# Patient Record
Sex: Male | Born: 1945
Health system: Southern US, Community
[De-identification: ages and names within clinical notes are randomized; demographics above are authoritative.]

## PROBLEM LIST (undated history)

## (undated) DIAGNOSIS — W3400XA Accidental discharge from unspecified firearms or gun, initial encounter: Secondary | ICD-10-CM

## (undated) DIAGNOSIS — N183 Chronic kidney disease, stage 3 unspecified: Secondary | ICD-10-CM

## (undated) DIAGNOSIS — I509 Heart failure, unspecified: Secondary | ICD-10-CM

## (undated) DIAGNOSIS — I10 Essential (primary) hypertension: Secondary | ICD-10-CM

## (undated) DIAGNOSIS — K219 Gastro-esophageal reflux disease without esophagitis: Secondary | ICD-10-CM

## (undated) DIAGNOSIS — E785 Hyperlipidemia, unspecified: Secondary | ICD-10-CM

## (undated) DIAGNOSIS — Z8719 Personal history of other diseases of the digestive system: Secondary | ICD-10-CM

## (undated) DIAGNOSIS — E119 Type 2 diabetes mellitus without complications: Secondary | ICD-10-CM

## (undated) DIAGNOSIS — Y249XXA Unspecified firearm discharge, undetermined intent, initial encounter: Secondary | ICD-10-CM

## (undated) DIAGNOSIS — M1990 Unspecified osteoarthritis, unspecified site: Secondary | ICD-10-CM

## (undated) DIAGNOSIS — I480 Paroxysmal atrial fibrillation: Secondary | ICD-10-CM

## (undated) HISTORY — DX: Paroxysmal atrial fibrillation: I48.0

## (undated) HISTORY — DX: Unspecified firearm discharge, undetermined intent, initial encounter: Y24.9XXA

## (undated) HISTORY — DX: Type 2 diabetes mellitus without complications: E11.9

## (undated) HISTORY — DX: Accidental discharge from unspecified firearms or gun, initial encounter: W34.00XA

## (undated) HISTORY — DX: Essential (primary) hypertension: I10

## (undated) HISTORY — DX: Unspecified osteoarthritis, unspecified site: M19.90

## (undated) HISTORY — DX: Hyperlipidemia, unspecified: E78.5

## (undated) HISTORY — PX: OTHER SURGICAL HISTORY: SHX169

## (undated) HISTORY — DX: Gastro-esophageal reflux disease without esophagitis: K21.9

## (undated) HISTORY — PX: HERNIA REPAIR: SHX51

---

## 2002-07-07 ENCOUNTER — Emergency Department (HOSPITAL_COMMUNITY): Admission: EM | Admit: 2002-07-07 | Discharge: 2002-07-07 | Payer: Self-pay | Admitting: *Deleted

## 2003-06-12 ENCOUNTER — Ambulatory Visit (HOSPITAL_COMMUNITY): Admission: RE | Admit: 2003-06-12 | Discharge: 2003-06-12 | Payer: Self-pay | Admitting: Family Medicine

## 2006-04-02 ENCOUNTER — Ambulatory Visit: Payer: Self-pay | Admitting: Family Medicine

## 2006-04-02 DIAGNOSIS — I10 Essential (primary) hypertension: Secondary | ICD-10-CM | POA: Insufficient documentation

## 2006-04-03 ENCOUNTER — Encounter (INDEPENDENT_AMBULATORY_CARE_PROVIDER_SITE_OTHER): Payer: Self-pay | Admitting: Family Medicine

## 2006-04-06 ENCOUNTER — Encounter (INDEPENDENT_AMBULATORY_CARE_PROVIDER_SITE_OTHER): Payer: Self-pay | Admitting: Family Medicine

## 2006-04-22 ENCOUNTER — Encounter (INDEPENDENT_AMBULATORY_CARE_PROVIDER_SITE_OTHER): Payer: Self-pay | Admitting: Family Medicine

## 2006-04-28 LAB — CONVERTED CEMR LAB
ALT: 13 units/L (ref 0–53)
Albumin: 3.9 g/dL (ref 3.5–5.2)
BUN: 10 mg/dL (ref 6–23)
Basophils Absolute: 0 10*3/uL (ref 0.0–0.1)
Basophils Relative: 0 % (ref 0–1)
CO2: 26 meq/L (ref 19–32)
Calcium: 8.9 mg/dL (ref 8.4–10.5)
Chloride: 101 meq/L (ref 96–112)
Eosinophils Absolute: 0.1 10*3/uL (ref 0.0–0.7)
HDL: 44 mg/dL (ref >39)
Hemoglobin: 14.7 g/dL (ref 13.0–17.0)
Lymphs Abs: 1.9 10*3/uL (ref 0.7–3.3)
MCHC: 33.3 g/dL (ref 30.0–36.0)
MCV: 82.6 fL (ref 78.0–100.0)
Monocytes Absolute: 0.6 10*3/uL (ref 0.2–0.7)
Platelets: 224 10*3/uL (ref 150–400)
Potassium: 3.3 meq/L — ABNORMAL LOW (ref 3.5–5.3)
RDW: 14.3 % — ABNORMAL HIGH (ref 11.5–14.0)
TSH: 1.06 microintl units/mL (ref 0.350–5.50)
Total CHOL/HDL Ratio: 3.4
WBC: 7.9 10*3/uL (ref 4.0–10.5)

## 2006-05-05 ENCOUNTER — Ambulatory Visit: Payer: Self-pay | Admitting: Family Medicine

## 2006-05-05 DIAGNOSIS — E782 Mixed hyperlipidemia: Secondary | ICD-10-CM | POA: Insufficient documentation

## 2006-05-05 DIAGNOSIS — R7989 Other specified abnormal findings of blood chemistry: Secondary | ICD-10-CM | POA: Insufficient documentation

## 2006-05-05 DIAGNOSIS — E785 Hyperlipidemia, unspecified: Secondary | ICD-10-CM | POA: Insufficient documentation

## 2006-05-05 LAB — CONVERTED CEMR LAB
Cholesterol, target level: 200 mg/dL
Hgb A1c MFr Bld: 6.1 %

## 2006-05-11 ENCOUNTER — Telehealth (INDEPENDENT_AMBULATORY_CARE_PROVIDER_SITE_OTHER): Payer: Self-pay | Admitting: Family Medicine

## 2006-05-11 ENCOUNTER — Encounter (INDEPENDENT_AMBULATORY_CARE_PROVIDER_SITE_OTHER): Payer: Self-pay | Admitting: Family Medicine

## 2006-05-18 ENCOUNTER — Ambulatory Visit (HOSPITAL_COMMUNITY): Admission: RE | Admit: 2006-05-18 | Discharge: 2006-05-18 | Payer: Self-pay | Admitting: Family Medicine

## 2006-05-21 ENCOUNTER — Telehealth (INDEPENDENT_AMBULATORY_CARE_PROVIDER_SITE_OTHER): Payer: Self-pay | Admitting: *Deleted

## 2006-05-29 ENCOUNTER — Encounter (INDEPENDENT_AMBULATORY_CARE_PROVIDER_SITE_OTHER): Payer: Self-pay | Admitting: Family Medicine

## 2006-06-02 ENCOUNTER — Ambulatory Visit: Payer: Self-pay | Admitting: Family Medicine

## 2006-06-03 ENCOUNTER — Encounter (INDEPENDENT_AMBULATORY_CARE_PROVIDER_SITE_OTHER): Payer: Self-pay | Admitting: Family Medicine

## 2006-06-04 LAB — CONVERTED CEMR LAB
CO2: 26 meq/L (ref 19–32)
Chloride: 103 meq/L (ref 96–112)
Glucose, Bld: 96 mg/dL (ref 70–99)
Potassium: 3.8 meq/L (ref 3.5–5.3)

## 2007-01-18 ENCOUNTER — Telehealth (INDEPENDENT_AMBULATORY_CARE_PROVIDER_SITE_OTHER): Payer: Self-pay | Admitting: *Deleted

## 2007-01-18 ENCOUNTER — Ambulatory Visit (HOSPITAL_COMMUNITY): Admission: RE | Admit: 2007-01-18 | Discharge: 2007-01-18 | Payer: Self-pay | Admitting: Family Medicine

## 2007-01-18 ENCOUNTER — Ambulatory Visit: Payer: Self-pay | Admitting: Family Medicine

## 2007-01-19 ENCOUNTER — Telehealth (INDEPENDENT_AMBULATORY_CARE_PROVIDER_SITE_OTHER): Payer: Self-pay | Admitting: *Deleted

## 2007-01-19 ENCOUNTER — Encounter (INDEPENDENT_AMBULATORY_CARE_PROVIDER_SITE_OTHER): Payer: Self-pay | Admitting: Family Medicine

## 2007-01-28 ENCOUNTER — Encounter: Payer: Self-pay | Admitting: Family Medicine

## 2007-02-02 ENCOUNTER — Telehealth (INDEPENDENT_AMBULATORY_CARE_PROVIDER_SITE_OTHER): Payer: Self-pay | Admitting: Family Medicine

## 2007-02-04 ENCOUNTER — Encounter (INDEPENDENT_AMBULATORY_CARE_PROVIDER_SITE_OTHER): Payer: Self-pay | Admitting: Family Medicine

## 2007-02-25 ENCOUNTER — Ambulatory Visit: Payer: Self-pay | Admitting: Family Medicine

## 2007-03-19 ENCOUNTER — Ambulatory Visit: Payer: Self-pay | Admitting: Cardiology

## 2007-03-19 ENCOUNTER — Encounter (INDEPENDENT_AMBULATORY_CARE_PROVIDER_SITE_OTHER): Payer: Self-pay | Admitting: Family Medicine

## 2007-03-26 ENCOUNTER — Ambulatory Visit (HOSPITAL_COMMUNITY): Admission: RE | Admit: 2007-03-26 | Discharge: 2007-03-26 | Payer: Self-pay | Admitting: Cardiology

## 2007-03-26 ENCOUNTER — Ambulatory Visit: Payer: Self-pay | Admitting: Cardiology

## 2007-09-21 ENCOUNTER — Emergency Department (HOSPITAL_COMMUNITY): Admission: EM | Admit: 2007-09-21 | Discharge: 2007-09-21 | Payer: Self-pay | Admitting: Cardiology

## 2007-10-18 ENCOUNTER — Ambulatory Visit: Payer: Self-pay | Admitting: Family Medicine

## 2007-11-13 ENCOUNTER — Encounter (INDEPENDENT_AMBULATORY_CARE_PROVIDER_SITE_OTHER): Payer: Self-pay | Admitting: Family Medicine

## 2007-11-15 ENCOUNTER — Ambulatory Visit: Payer: Self-pay | Admitting: Family Medicine

## 2007-11-15 LAB — CONVERTED CEMR LAB
AST: 19 units/L (ref 0–37)
Albumin: 4.1 g/dL (ref 3.5–5.2)
Alkaline Phosphatase: 77 units/L (ref 39–117)
BUN: 19 mg/dL (ref 6–23)
Basophils Absolute: 0 10*3/uL (ref 0.0–0.1)
Calcium: 9.2 mg/dL (ref 8.4–10.5)
Cholesterol: 198 mg/dL (ref 0–200)
Eosinophils Absolute: 0.1 10*3/uL (ref 0.0–0.7)
Glucose, Bld: 105 mg/dL — ABNORMAL HIGH (ref 70–99)
LDL Goal: 160 mg/dL
Lymphs Abs: 1.8 10*3/uL (ref 0.7–4.0)
MCHC: 32.5 g/dL (ref 30.0–36.0)
Neutro Abs: 3.7 10*3/uL (ref 1.7–7.7)
PSA: 1.93 ng/mL (ref 0.10–4.00)
Platelets: 165 10*3/uL (ref 150–400)
RBC: 5.31 M/uL (ref 4.22–5.81)
Sodium: 141 meq/L (ref 135–145)
TSH: 1.738 microintl units/mL (ref 0.350–4.50)
Total Bilirubin: 0.7 mg/dL (ref 0.3–1.2)
Triglycerides: 104 mg/dL (ref <150)
WBC: 6 10*3/uL (ref 4.0–10.5)

## 2007-12-20 ENCOUNTER — Ambulatory Visit (HOSPITAL_COMMUNITY): Admission: RE | Admit: 2007-12-20 | Discharge: 2007-12-20 | Payer: Self-pay | Admitting: Internal Medicine

## 2007-12-20 ENCOUNTER — Ambulatory Visit: Payer: Self-pay | Admitting: Internal Medicine

## 2008-02-14 ENCOUNTER — Ambulatory Visit: Payer: Self-pay | Admitting: Family Medicine

## 2008-02-14 DIAGNOSIS — E669 Obesity, unspecified: Secondary | ICD-10-CM | POA: Insufficient documentation

## 2008-02-14 LAB — CONVERTED CEMR LAB
Blood in Urine, dipstick: NEGATIVE
Glucose, Urine, Semiquant: NEGATIVE
Ketones, urine, test strip: NEGATIVE
Urobilinogen, UA: 0.2

## 2008-02-15 ENCOUNTER — Encounter (INDEPENDENT_AMBULATORY_CARE_PROVIDER_SITE_OTHER): Payer: Self-pay | Admitting: Family Medicine

## 2008-02-16 LAB — CONVERTED CEMR LAB
Calcium: 8.9 mg/dL (ref 8.4–10.5)
Creatinine, Ser: 1.1 mg/dL (ref 0.40–1.50)
Glucose, Bld: 98 mg/dL (ref 70–99)

## 2008-05-12 ENCOUNTER — Ambulatory Visit: Payer: Self-pay | Admitting: Family Medicine

## 2008-05-12 DIAGNOSIS — M25569 Pain in unspecified knee: Secondary | ICD-10-CM | POA: Insufficient documentation

## 2008-05-29 ENCOUNTER — Ambulatory Visit (HOSPITAL_COMMUNITY): Admission: RE | Admit: 2008-05-29 | Discharge: 2008-05-29 | Payer: Self-pay | Admitting: Family Medicine

## 2008-05-29 ENCOUNTER — Ambulatory Visit: Payer: Self-pay | Admitting: Family Medicine

## 2008-05-29 ENCOUNTER — Encounter: Payer: Self-pay | Admitting: Orthopedic Surgery

## 2008-06-05 ENCOUNTER — Ambulatory Visit: Payer: Self-pay | Admitting: Orthopedic Surgery

## 2008-06-12 ENCOUNTER — Encounter: Payer: Self-pay | Admitting: Orthopedic Surgery

## 2009-04-05 ENCOUNTER — Ambulatory Visit: Payer: Self-pay | Admitting: Family Medicine

## 2009-04-05 DIAGNOSIS — S40019A Contusion of unspecified shoulder, initial encounter: Secondary | ICD-10-CM | POA: Insufficient documentation

## 2009-05-14 ENCOUNTER — Encounter: Payer: Self-pay | Admitting: Physician Assistant

## 2009-05-15 LAB — CONVERTED CEMR LAB
Calcium: 9.3 mg/dL (ref 8.4–10.5)
Chloride: 102 meq/L (ref 96–112)
Cholesterol: 198 mg/dL (ref 0–200)
Glucose, Bld: 118 mg/dL — ABNORMAL HIGH (ref 70–99)
Hemoglobin: 15.2 g/dL (ref 13.0–17.0)
Hgb A1c MFr Bld: 6.6 % — ABNORMAL HIGH (ref <5.7)
LDL Cholesterol: 106 mg/dL — ABNORMAL HIGH (ref 0–99)
MCHC: 32.8 g/dL (ref 30.0–36.0)
Platelets: 174 10*3/uL (ref 150–400)
RDW: 14.9 % (ref 11.5–15.5)
Sodium: 140 meq/L (ref 135–145)
Total Bilirubin: 0.8 mg/dL (ref 0.3–1.2)
Total Protein: 7.1 g/dL (ref 6.0–8.3)
Triglycerides: 138 mg/dL (ref <150)
WBC: 6.5 10*3/uL (ref 4.0–10.5)

## 2009-05-17 ENCOUNTER — Ambulatory Visit: Payer: Self-pay | Admitting: Family Medicine

## 2009-05-17 LAB — FECAL OCCULT BLOOD, GUAIAC: Fecal Occult Blood: NEGATIVE

## 2010-02-26 NOTE — Assessment & Plan Note (Signed)
Summary: NEW PATIENT- room 2   Vital Signs:  Patient profile:   65 year old male Height:      68.75 inches Weight:      222.25 pounds BMI:     33.18 O2 Sat:      98 % on Room air Pulse rate:   70 / minute Resp:     16 per minute BP sitting:   180 / 90  (left arm)  Vitals Entered By: Baldomero Lamy LPN (March 10, 624THL 579FGE PM) CC: new patient Is Patient Diabetic? No Pain Assessment Patient in pain? no      Comments patient out of meds for some time   Primary Provider:  Kennith Gain PA  CC:  new patient.  History of Present Illness: New pt here to establish care.  He has been out of his medications for months.  States he has been feeling fine.  No chest pain, palp, difficulty breathing or periph edema.    He was prev on Flomax.  In the past was urinating 3-5x/night.  Pt has been off of this also x months.  States only goes once per night now.    He did trip over his grandbaby a couple wks ago. Landed on his Lt shoulder.  States it is stiff now in the mornings when he awakens but then looses up.  No pain with movement.    Current Medications (verified): 1)  Aspir-Low 81 Mg Tbec (Aspirin) .... One By Mouth Daily 2)  Lisinopril-Hydrochlorothiazide 20-12.5 Mg  Tabs (Lisinopril-Hydrochlorothiazide) .... Two Times A Day 3)  Amlodipine Besylate 5 Mg  Tabs (Amlodipine Besylate) .... One Daily 4)  Flomax 0.4 Mg Xr24h-Cap (Tamsulosin Hcl) .... One Daily 5)  Lortab 5 5-500 Mg Tabs (Hydrocodone-Acetaminophen) .... One Every 6 Hours As Needed For Knee Pain  Allergies (verified): No Known Drug Allergies  Past History:  Past medical, surgical, family and social histories (including risk factors) reviewed for relevance to current acute and chronic problems.  Past Medical History: Reviewed history from 10/18/2007 and no changes required. Current Problems:  VIRAL URI (ICD-465.9) SPECIAL SCREENING MALIGNANT NEOPLASM OF PROSTATE (ICD-V76.44) DYSPNEA ON EXERTION  (ICD-786.09) DEGENERATIVE JOINT DISEASE - LEFT KNEE (ICD-715.90) HYPOKALEMIA (ICD-276.8) HYPERGLYCEMIA (ICD-790.6) HYPERLIPIDEMIA (ICD-272.4) MALAISE AND FATIGUE (ICD-780.79) SYMPTOM, EDEMA (ICD-782.3) HYPERTENSION (ICD-401.9) GERD (ICD-530.81)  Past Surgical History: Reviewed history from 04/02/2006 and no changes required. Denies surgical history  Family History: Reviewed history from 10/18/2007 and no changes required. father- deceased- hrt problems, COPD, DM - age 18 mother- dementia - age 59 Brothers: Five total - 4 living. One killed in altercation - gunshot at age 68. States others 33s and 50s and healthy but one had MVA and is paralized. Sisters: Two: 91 and 50 - Healthy 2 children- healthy Boy age 24 - healthy, Girl age 75 - healthy  Social History: Reviewed history from 06/05/2008 and no changes required. 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 ENT:  Denies earache, nasal congestion, and sore throat. CV:  Denies chest pain or discomfort and palpitations. Resp:  Denies cough and shortness of breath. GI:  Denies bloody stools, change in bowel habits, constipation, and indigestion. GU:  Denies hematuria, incontinence, nocturia, urinary frequency, and urinary hesitancy.  Physical Exam  General:  Well-developed,well-nourished,in no acute distress; alert,appropriate and cooperative throughout examination Head:  Normocephalic and atraumatic without obvious abnormalities. No apparent alopecia or balding. Ears:  External ear exam shows  no significant lesions or deformities.  Otoscopic examination reveals clear canals, tympanic membranes are intact bilaterally without bulging, retraction, inflammation or discharge. Hearing is grossly normal bilaterally. Nose:  External nasal examination shows no deformity or inflammation. Nasal mucosa are pink and moist without lesions or  exudates. Mouth:  Oral mucosa and oropharynx without lesions or exudates.   Neck:  No deformities, masses, or tenderness noted. Lungs:  Normal respiratory effort, chest expands symmetrically. Lungs are clear to auscultation, no crackles or wheezes. Heart:  Normal rate and regular rhythm. S1 and S2 normal without gallop, murmur, click, rub or other extra sounds. Abdomen:  Bowel sounds positive,abdomen soft and non-tender without masses, organomegaly or hernias noted. Msk:  Lt shoulder FROM, nontender to palp. Extremities:  No clubbing, cyanosis, edema, or deformity noted with normal full range of motion of all joints.   Skin:  Intact without suspicious lesions or rashes Cervical Nodes:  No lymphadenopathy noted Psych:  Cognition and judgment appear intact. Alert and cooperative with normal attention span and concentration. No apparent delusions, illusions, hallucinations   Impression & Recommendations:  Problem # 1:  HYPERTENSION (ICD-401.9) Assessment Deteriorated Refilled previous medications.  His updated medication list for this problem includes:    Lisinopril-hydrochlorothiazide 20-12.5 Mg Tabs (Lisinopril-hydrochlorothiazide) .Marland Kitchen..Marland Kitchen Two times a day    Amlodipine Besylate 5 Mg Tabs (Amlodipine besylate) ..... One daily  Orders: T-Comprehensive Metabolic Panel ([id number])  Problem # 2:  GERD (ICD-530.81) Assessment: Improved Pt not currently taking any meds for & is asymptomatic.  Problem # 3:  Sx of BENIGN PROSTATIC HYPERTROPHY, WITH URINARY OBSTRUCTION (ICD-600.01) Assessment: Improved Asymptomatic & has been off meds x months. Will not refill today.  Wait & see if syptoms return in future.   Problem # 4:  CONTUSION, SHOULDER (ICD-923.00) Assessment: Improved ROM exercises two times a day. Ice. Follow up if no improv in couple wks.   Complete Medication List: 1)  Aspir-low 81 Mg Tbec (Aspirin) .... One by mouth daily 2)  Lisinopril-hydrochlorothiazide 20-12.5 Mg Tabs  (Lisinopril-hydrochlorothiazide) .... Two times a day 3)  Amlodipine Besylate 5 Mg Tabs (Amlodipine besylate) .... One daily 4)  Flomax 0.4 Mg Xr24h-cap (Tamsulosin hcl) .... One daily 5)  Lortab 5 5-500 Mg Tabs (Hydrocodone-acetaminophen) .... One every 6 hours as needed for knee pain  Other Orders: T-Lipid Profile [id number]) T- Hemoglobin A1C [id number]) T-CBC No Diff [id number]) T-TSH [id number]) T-PSA [id number]) T-Vitamin D (25-Hydroxy) AZ:[id number])  Patient Instructions: 1)  Appt in 4-6 weeks for physical. 2)  I have ordered blood work.  This needs to be drawn fasting. 3)  I have refilled your blood pressure pills.  Start taking them again daily as prescribed. 4)  Take an Aspirin every day. Prescriptions: AMLODIPINE BESYLATE 5 MG  TABS (AMLODIPINE BESYLATE) One daily  #90 x 1   Entered and Authorized by:   Kennith Gain PA   Signed by:   Kennith Gain PA on 04/05/2009   Method used:   Electronically to        [facility] (retail)       [address]       [place], [place]  [address]       Ph: [phone]       Fax: [phone]   RxID:   [id number] LISINOPRIL-HYDROCHLOROTHIAZIDE 20-12.5 MG  TABS (LISINOPRIL-HYDROCHLOROTHIAZIDE) two times a day  #180 x 1   Entered and Authorized by:   Kennith Gain PA   Signed  by:   Kennith Gain PA on 04/05/2009   Method used:   Electronically to        [facility] (retail)       [address]       [place], [place]  [address]       Ph: [phone]       Fax: [phone]   RxID:   [id number]

## 2010-02-26 NOTE — Letter (Signed)
Summary: rmsa chart  rmsa chart   Imported By: Eliezer Mccoy 10/26/2009 14:09:23  _____________________________________________________________________  External Attachment:    Type:   Image     Comment:   External Document

## 2010-02-26 NOTE — Assessment & Plan Note (Signed)
Summary: physical- room 1   Vital Signs:  Patient profile:   65 year old male Height:      68.75 inches Weight:      219 pounds BMI:     32.69 O2 Sat:      98 % on Room air Pulse rate:   77 / minute Resp:     16 per minute BP sitting:   160 / 100  (left arm)  Vitals Entered By: Baldomero Lamy LPN (April 21, 624THL [id number] PM)  Nutrition Counseling: Patient's BMI is greater than 25 and therefore counseled on weight management options. CC: physical  Is Patient Diabetic? No Pain Assessment Patient in pain? no        Primary Provider:  Kennith Gain PA  CC:  physical .  History of Present Illness: Pt is here today for physical. He had labs completed & review today also.  Hx of htn. Has been out of BP meds x 4 days. He didn't look at the rx bottle to see that he had refills available. No headache, chest pain or palpitations.  New dx of DM.  Has not picked up Glucophage Rx yet.   New dx of Vit D deficiency. Has not picked up Vit D Rx yet.  No eye exams. No dental care x yrs. Flu vac yrly thru employer. Colonoscopy approx 1 1/2 yrs ago per pt.  Was neg.  Due 10 yrs.   Current Medications (verified): 1)  Amlodipine Besylate 5 Mg  Tabs (Amlodipine Besylate) .... One Daily 2)  Glucophage 500 Mg Tabs (Metformin Hcl) .... One Tab By Mouth Two Times A Day 3)  Vitamin D (Ergocalciferol) 50000 Unit Caps (Ergocalciferol) .... One Tab By Mouth Every Week  Allergies (verified): No Known Drug Allergies  Past History:  Past medical, surgical, family and social histories (including risk factors) reviewed, and no changes noted (except as noted below).  Past Medical History: DEGENERATIVE JOINT DISEASE - LEFT KNEE (ICD-715.90) HYPERLIPIDEMIA (ICD-272.4) HYPERTENSION (ICD-401.9) GERD (ICD-530.81) Diabetes mellitus, type II  Past Surgical History: Reviewed history from 04/02/2006 and no changes required. Denies surgical history  Family History: Reviewed history from 10/18/2007 and  no changes required. father- deceased- hrt problems, COPD, DM - age 36 mother- dementia Brothers: Five total - 4 living. One killed in altercation - gunshot at age 29. States others  healthy but one had MVA and is paralized. Sisters: Two: 70 and 19 - Healthy 2 children- healthy Boy  healthy, Girl  healthy  Social History: Reviewed history from 06/05/2008 and no changes required. 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 ENT:  Denies decreased hearing and earache. CV:  Denies chest pain or discomfort and palpitations. Resp:  Denies cough and shortness of breath. GI:  Complains of indigestion; denies abdominal pain, bloody stools, change in bowel habits, constipation, dark tarry stools, diarrhea, loss of appetite, nausea, and vomiting; HEARTBURN 1-2 X/WK.Marland Kitchen GU:  Denies dysuria, erectile dysfunction, hematuria, nocturia, and urinary frequency. MS:  Complains of joint pain; denies low back pain and mid back pain; INTERMITTENT LT SHOULDER PAIN, PER PT HAS BEEN TOLD HE HAS ARTHRITIS. Derm:  Complains of lesion(s) and rash.  Physical Exam  General:  Well-developed,well-nourished,in no acute distress; alert,appropriate and cooperative throughout examination Head:  Normocephalic and atraumatic without obvious abnormalities. No apparent alopecia or balding. Eyes:  No corneal or conjunctival inflammation noted. EOMI. Perrla. Funduscopic exam benign, without hemorrhages,  exudates or papilledema.  Ears:  External ear exam shows no significant lesions or deformities.  Otoscopic examination reveals clear canals, tympanic membranes are intact bilaterally without bulging, retraction, inflammation or discharge. Hearing is grossly normal bilaterally. Nose:  External nasal examination shows no deformity or inflammation. Nasal mucosa are pink and moist without lesions or exudates. Mouth:  Oral mucosa  and oropharynx without lesions or exudates.   Neck:  No deformities, masses, or tenderness noted.no thyromegaly and no carotid bruits.   Chest Wall:  No deformities, masses, tenderness or gynecomastia noted. Lungs:  Normal respiratory effort, chest expands symmetrically. Lungs are clear to auscultation, no crackles or wheezes. Heart:  Normal rate and regular rhythm. S1 and S2 normal without gallop, murmur, click, rub or other extra sounds. Abdomen:  Bowel sounds positive,abdomen soft and non-tender without masses, organomegaly or hernias noted. Rectal:  No external abnormalities noted. Normal sphincter tone. No rectal masses or tenderness. Genitalia:  Testes bilaterally descended without nodularity, tenderness or masses. No scrotal masses or lesions. No penis lesions or urethral discharge. Prostate:  Prostate gland firm and smooth, no enlargement, nodularity, tenderness, mass, asymmetry or induration. Pulses:  R and L carotid,radial,femoral,dorsalis pedis and posterior tibial pulses are full and equal bilaterally Extremities:  No clubbing, cyanosis, edema, or deformity noted with normal full range of motion of all joints.   Neurologic:  alert & oriented X3, strength normal in all extremities, sensation intact to light touch, gait normal, and DTRs symmetrical and normal.   Skin:  Intact without suspicious lesions or rashes. Pt has nevus Rt nose.  He states this has been present x > 20 yrs & is unchanged. Cervical Nodes:  No lymphadenopathy noted Inguinal Nodes:  No significant adenopathy Psych:  Cognition and judgment appear intact. Alert and cooperative with normal attention span and concentration. No apparent delusions, illusions, hallucinations  Diabetes Management Exam:    Foot Exam (with socks and/or shoes not present):       Sensory-Monofilament:          Left foot: normal          Right foot: normal       Inspection:          Left foot: normal          Right foot: normal       Nails:           Left foot: thickened          Right foot: thickened   Impression & Recommendations:  Problem # 1:  Preventive Health Care (ICD-V70.0) Tdap & Pneumovax today. Encouraged yrly eye exam, & routine dental care.  Problem # 2:  HYPERTENSION (ICD-401.9) Assessment: Deteriorated Pt has not refilled BP med.  Discussed with him that it is important that he take this daily & not run out of meds.  The following medications were removed from the medication list:    Lisinopril-hydrochlorothiazide 20-12.5 Mg Tabs (Lisinopril-hydrochlorothiazide) .Marland Kitchen..Marland Kitchen Two times a day His updated medication list for this problem includes:    Amlodipine Besylate 5 Mg Tabs (Amlodipine besylate) ..... One daily  Orders: T-Basic Metabolic Panel (99991111)  Problem # 3:  DIABETES MELLITUS, TYPE II (ICD-250.00) Assessment: New Discussed dietary changes, eye exam, dental care, yrly flu vaccine, monitoring feet. Plan to give pt additional diabetes information gradually at each visit, & reinforce what was instructed at previous visits.  I think he will retain the information better that way. Will institute home BS monitoring at a later date.  The following medications were removed from the medication list:    Aspir-low 81 Mg Tbec (Aspirin) ..... One by mouth daily    Lisinopril-hydrochlorothiazide 20-12.5 Mg Tabs (Lisinopril-hydrochlorothiazide) .Marland Kitchen..Marland Kitchen Two times a day His updated medication list for this problem includes:    Glucophage 500 Mg Tabs (Metformin hcl) ..... One tab by mouth two times a day    Aspirin 325 Mg Tabs (Aspirin) .Marland Kitchen... Take 1 daily  Orders: T-Basic Metabolic Panel (99991111) T- Hemoglobin A1C [id number]) T-Microalbumin, Semiquant [id number])  Problem # 4:  HYPERLIPIDEMIA (ICD-272.4) Assessment: Comment Only Discussed low fat diet, exercise, & wt loss. Consider Rx meds if not at goal in 78mos.  Orders: T-Lipid Profile [id number])  Complete Medication List: 1)  Amlodipine  Besylate 5 Mg Tabs (Amlodipine besylate) .... One daily 2)  Glucophage 500 Mg Tabs (Metformin hcl) .... One tab by mouth two times a day 3)  Vitamin D (ergocalciferol) 50000 Unit Caps (Ergocalciferol) .... One tab by mouth every week 4)  Aspirin 325 Mg Tabs (Aspirin) .... Take 1 daily  Other Orders: Tdap => 16yrs IM [id number]) Admin 1st Vaccine [id number]) Pneumococcal Vaccine [id number]) Admin of Any Addtl Vaccine AD:[id number]) Hemoccult Guaiac-1 spec.(in office) [id number])  Patient Instructions: 1)  Please schedule a follow-up appointment in 3 months. 2)  It is important that you exercise regularly at least 20 minutes 5 times a week. If you develop chest pain, have severe difficulty breathing, or feel very tired , stop exercising immediately and seek medical attention. 3)  You need to lose weight. Consider a lower calorie diet and regular exercise.  4)  See your eye doctor yearly to check for diabetic eye damage. 5)  Check your feet each night for sore areas, calluses or signs of infection. 6)  I have given you diet information today for your diabetes and high cholesterol. 7)  You have received a tetnus vaccine and a pneumonia shot today. 8)  Take an Aspirin every day. 9)  Take your prescription medication as prescribed.  Do not run out.  It is very important to keep your high blood pressure and diabetes under control.   Immunizations Administered:  Tetanus Vaccine:    Vaccine Type: Tdap    Site: right deltoid    Mfr: GlaxoSmithKline    Dose: 0.5 ml    Route: IM    Given by: Baldomero Lamy LPN    Exp. Date: 04/21/2011    Lot #: [id number]    VIS given: 12/15/06 version given May 17, 2009.  Pneumonia Vaccine:    Vaccine Type: Pneumovax    Site: left deltoid    Mfr: Merck    Dose: 0.5 ml    Route: IM    Given by: Baldomero Lamy LPN    Exp. Date: 02/22/2010    Lot #: [id number]    VIS given: 08/25/95 version given May 17, 2009.    Preventive Care Screening  Hemoccult:    Date:  05/17/2009     Next Due:  05/2010    Results:  negative  Last Pneumovax:    Date:  05/17/2009    Results:  Pneumovax  Last Tetanus Booster:    Date:  05/17/2009    Results:  Tdap   Prevention & Chronic Care Immunizations   Influenza vaccine: Fluvax Non-MCR  (10/18/2007)   Influenza vaccine due: 10/2008    Tetanus booster: 05/17/2009: Tdap   Tetanus booster due: 05/18/2019    Pneumococcal vaccine: Pneumovax  (05/17/2009)   Pneumococcal vaccine due: 03/29/2012  H. zoster vaccine: Not documented  Colorectal Screening   Hemoccult: negative  (05/17/2009)   Hemoccult due: 05/2010    Colonoscopy: normal  (12/20/2007)   Colonoscopy due: 12/2017  Other Screening   PSA: 1.69  (05/14/2009)   PSA due due: 11/2008   Smoking status: never  (06/05/2008)  Diabetes Mellitus   HgbA1C: 6.6  (05/14/2009)   Hemoglobin A1C due: 08/13/2009    Eye exam: Not documented   Diabetic eye exam action/deferral: Ophthalmology referral  (05/17/2009)    Foot exam: yes  (05/17/2009)   High risk foot: Not documented   Foot care education: Not documented   Foot exam due: 08/16/2009    Urine microalbumin/creatinine ratio: Not documented   Urine microalbumin action/deferral: Ordered    Diabetes flowsheet reviewed?: Yes   Progress toward A1C goal: At goal  Lipids   Total Cholesterol: 198  (05/14/2009)   Lipid panel action/deferral: Lipid Panel ordered   LDL: 106  (05/14/2009)   LDL Direct: Not documented   HDL: 64  (05/14/2009)   Triglycerides: 138  (05/14/2009)   Lipid panel due: 08/13/2009    SGOT (AST): 22  (05/14/2009)   SGPT (ALT): 12  (05/14/2009)   Alkaline phosphatase: 92  (05/14/2009)   Total bilirubin: 0.8  (05/14/2009)    Lipid flowsheet reviewed?: Yes   Progress toward LDL goal: Unchanged  Hypertension   Last Blood Pressure: 160 / 100  (05/17/2009)   Serum creatinine: 0.97  (05/14/2009)   Serum potassium 3.9  (123XX123)   Basic metabolic panel due: 99991111    Hypertension  flowsheet reviewed?: Yes   Progress toward BP goal: Deteriorated   Hypertension comments: Pt out of medications at 05-17-09 & 04-05-09 visits.  Self-Management Support :    Diabetes self-management support: Not documented    Hypertension self-management support: Not documented    Lipid self-management support: Not documented    Laboratory Results    Stool - Occult Blood Hemmoccult #1: negative Date: 05/17/2009

## 2010-03-26 ENCOUNTER — Encounter: Payer: Self-pay | Admitting: Family Medicine

## 2010-04-01 ENCOUNTER — Encounter: Payer: Self-pay | Admitting: Family Medicine

## 2010-04-01 ENCOUNTER — Ambulatory Visit: Payer: Self-pay | Admitting: Family Medicine

## 2010-04-09 NOTE — Letter (Signed)
Summary: 1st no show letter  1st no show letter   Imported By: Dierdre Harness 04/03/2010 11:29:28  _____________________________________________________________________  External Attachment:    Type:   Image     Comment:   External Document

## 2010-06-11 NOTE — Procedures (Signed)
NAMEVRAJ, TEASE NO.:  [id number]   MEDICAL RECORD NO.:  [id number]          PATIENT TYPE:  OUT   LOCATION:  RAD                           FACILITY:  [facility]   PHYSICIAN:  Cristopher Estimable. Lattie Haw, MD, FACCDATE OF BIRTH:  08/26/1945   DATE OF PROCEDURE:  03/26/2007  DATE OF DISCHARGE:                                ECHOCARDIOGRAM   CLINICAL DATA:  A 65 year old gentleman with dyspnea on exertion.   FINDINGS:  1. Baseline echocardiogram shows normal chamber dimensions, no      significant valvular abnormalities, normal left ventricular size      and function with mild LVH.  2. Treadmill exercise performed to a workload of 10 METS and a heart      rate of 190, 118% of age-predicted maximum.  Exercise discontinued      due to fatigue; no chest pain reported.  3. Blood pressure increased from a resting value of 135/80 to 180/90      during exercise, a normal response.  4. No arrhythmias noted.  5. EKG with normal sinus rhythm with frequent PACs, nondiagnostic      lateral Q waves, left atrial abnormality, delayed R-wave      progression, nonspecific T-wave abnormality.  6. Stress EKG with no significant change with modest upsloping ST-      segment depression in lead VI.  7. Post-exercise echocardiogram with suboptimal imaging.  No new wall      motion abnormalities.  Suggestion of increased contractility in      most segments.  8. Oxygen saturation was measured prior to the test and during      exercise.  It range between 94% of 98%.   IMPRESSION:  Negative stress echocardiogram revealing adequate exercise  tolerance, no hypoxemia during exertion and neither electrocardiographic  nor echocardiographic evidence for ischemia or infarction.  Other  findings as noted.      Cristopher Estimable. Lattie Haw, MD, [facility]  Electronically Signed     RMR/MEDQ  D:  03/28/2007  T:  03/29/2007  Job:  [id number]

## 2010-06-11 NOTE — Op Note (Signed)
[name], [name]             ACCOUNT NO.:  [id number]   MEDICAL RECORD NO.:  [id number]          PATIENT TYPE:  AMB   LOCATION:  DAY                           FACILITY:  [facility]   PHYSICIAN:  R. Garfield Cornea, M.D. DATE OF BIRTH:  1945/11/09   DATE OF PROCEDURE:  DATE OF DISCHARGE:                               OPERATIVE REPORT   Screening ileal colonoscopy.   INDICATIONS FOR PROCEDURE:  A 65 year old gentleman with no lower GI  tract symptoms comes for his first ever screening colonoscopy.  There is  no family history of polyps or colorectal neoplasia.  Colonoscopy is now  being done as a screening maneuver.  Risks, benefits, and limitations  have been reviewed, questions answered.  Please see the documentation in  the medical record.   PROCEDURE NOTE:  O2 saturation, blood pressure, pulse, respirations  monitored throughout the entire procedure.   CONSCIOUS SEDATION:  Versed 2 mg IV, Demerol 50 mg IV in divided doses.   INSTRUMENT:  Pentax video chip system.   FINDINGS:  Digital rectal exam revealed no abnormalities.  Endoscopic  findings:  The prep was good.  Colon:  Colonic mucosa was surveyed from  the rectosigmoid junction through the left transverse right colon to the  appendiceal orifice, ileocecal valve, cecum.  These structures were well  seen and photographed for the record.  Terminal ileum was intubated, 10  cm from this level the scope was slowly withdrawn.  All previously  mentioned mucosal surfaces were again seen.  The colonic mucosa appeared  normal, as did the terminal mucosa.  Scope was pulled down the rectum.  A thorough examination of the rectal mucosa including retroflexed view  of the anal verge demonstrated no abnormalities.  The patient tolerated  the procedure well, was reactive to endoscopy.   IMPRESSION:  Normal rectum, colon, and terminal ileum.   RECOMMENDATIONS:  Repeat screening colonoscopy in 10 years.      Phillip Wilson, M.D.  Electronically Signed     RMR/MEDQ  D:  12/20/2007  T:  12/20/2007  Job:  [id number]

## 2010-06-11 NOTE — Letter (Signed)
March 19, 2007    Weston Settle, M.D.  [facility]  [id number]  [place], [place] [address]   RE:  EMERIL, BELLMAN  MRN:  [id number]  /  DOB:  05/23/1945   Dear Roderic Scarce:   It was my pleasure evaluating Mr. Taitano in the office today in  consultation at your request.  As you know, this very nice gentleman has  enjoyed generally excellent health.  He was recently found to be  hypertensive and was started on appropriate medication.  An office EKG  was abnormal.  He describes some past episodes of exertional dyspnea,  prompting this referral.  He is somewhat vague about the particulars of  his prior symptoms.  He believes they occurred approximately 1 year ago.  He had exertional air hunger or a sense of not being able to take a deep  breath; this passed spontaneously.  He did not note decreased ability to  do useful work or to accomplish his leisure activities which include  hunting and fishing.  He has not noticed these sensations for some time.  He has no history of chest discomfort.  He has never been seen by a  cardiologist in the past nor undergone any significant cardiac testing.   PAST MEDICAL HISTORY:  Otherwise benign.  He has never been  hospitalized.  He is never undergone surgery.   CURRENT MEDICATIONS:  1. Lisinopril/HCT 20/12.5 mg b.i.d.  2. Aspirin 81 mg daily.   ALLERGIES:  The patient is aware of no drug allergies.   SOCIAL HISTORY:  Employed by [employer], performing mechanical work.  He is  unmarried at present, but has 2 adult children.   FAMILY HISTORY:  Father died due to myocardial infarction.  Of 7  siblings, none have vascular disease.   REVIEW OF SYSTEMS:  Notable for upper and lower dentures, GERD symptoms,  urinary frequency and arthritic discomfort in left knee.  He notes  occasional mild dependent edema.   EXAM:  A pleasant gentleman in no acute distress.  The weight is 209,  blood pressure 115/75, heart  rate 80 and regular, respirations 16.  HEENT:  Arteriolar narrowing; normal lids and conjunctivae; normal oral  mucosa.  Neck:  No jugular venous distention; normal carotid upstrokes  without bruits.  Endocrine:  No thyromegaly.  Hematopoietic;  No  adenopathy.  Lungs:  Clear.  Cardiac:  Normal first and second heart  sounds; normal PMI.  Abdomen:  Soft and nontender; normal bowel sounds;  no masses; no organomegaly.  Extremities:  No edema; normal distal  pulses.  Neurologic:  Symmetric strength and tone; normal cranial  nerves.   EKG:  Normal sinus rhythm; borderline left atrial abnormality; delayed R-  wave progression; leftward axis; nonspecific T-wave abnormality.   IMPRESSION:  Mr. Whatley symptoms are predominantly in the past;  however, he does have significant cardiovascular risk factors and an  abnormal EKG.  We will proceed with a stress echocardiogram, which will  permit an evaluation for structural heart disease as well as the ability  to exclude myocardial ischemia or infarction.  I will let you know the  results of that study and soon as it has been completed.  Beyond that,  Mr. Barbush hypertension appears to be well treated, and no  additional evaluation or treatment is warranted.   Thanks very much for sending this nice gentleman to me.    Sincerely,      Cristopher Estimable. Lattie Haw, MD, [facility]  Electronically Signed    RMR/MedQ  DD: 03/19/2007  DT: 03/21/2007  Job #: [id number]

## 2010-09-02 ENCOUNTER — Encounter: Payer: Self-pay | Admitting: Physician Assistant

## 2010-09-03 ENCOUNTER — Ambulatory Visit (INDEPENDENT_AMBULATORY_CARE_PROVIDER_SITE_OTHER): Payer: BC Managed Care – PPO | Admitting: Family Medicine

## 2010-09-03 ENCOUNTER — Encounter: Payer: Self-pay | Admitting: Family Medicine

## 2010-09-03 VITALS — BP 120/74 | HR 76 | Resp 16 | Ht 69.5 in | Wt 218.8 lb

## 2010-09-03 DIAGNOSIS — R0602 Shortness of breath: Secondary | ICD-10-CM

## 2010-09-03 DIAGNOSIS — E119 Type 2 diabetes mellitus without complications: Secondary | ICD-10-CM

## 2010-09-03 DIAGNOSIS — E559 Vitamin D deficiency, unspecified: Secondary | ICD-10-CM | POA: Insufficient documentation

## 2010-09-03 DIAGNOSIS — E785 Hyperlipidemia, unspecified: Secondary | ICD-10-CM

## 2010-09-03 DIAGNOSIS — I10 Essential (primary) hypertension: Secondary | ICD-10-CM

## 2010-09-03 MED ORDER — ASPIRIN 81 MG PO TABS: 81.0000 mg | ORAL_TABLET | Freq: Every day | ORAL | Status: DC

## 2010-09-03 MED ORDER — LISINOPRIL-HYDROCHLOROTHIAZIDE 20-12.5 MG PO TABS: 1.0000 | ORAL_TABLET | Freq: Every day | ORAL | Status: DC

## 2010-09-03 NOTE — Progress Notes (Signed)
  Subjective:    Patient ID: Phillip Wilson, male    DOB: 01/13/1946, 65 y.o.   MRN: [id number]  HPI Patient here for followup on his diabetes and hypertension. He has been visiting the [facility] in [place]. He is currently out of his blood pressure medication and needs a refill. He also would like to check him because he has been short of breath  during his exercise.  Diabetes- patient was diagnosed with diabetes last year he had an A1c of 6.6%. He was previously on Glucophage however has not been on this medication for greater than 8 months. He does not test his blood sugars at home and actually did not know he has a diagnosis of diabetes. He denies polyuria or polydipsia. He currently exercises with the wellness program in [facility]. He has lost 5 pounds recently.  Hypertension- patient ran out of his previous medication amlodipine per the records greater than 8 months ago. He has been maintained on lisinopril/HCTZ which was prescribed by a PA at the wellness Center. He denies any chest pain or leg edema. His prescription states one tablet twice a day however he has been taking one tablet daily.  SOB- patient states after he walks more than 1 mile he will occasionally get short of breath. He denies any cough fever chest pain during this time. He has a family history of asthma however has not had any wheezing. He is able to lay flat without any problems and he does not get leg swelling. He currently works driving a Scientific laboratory technician and does not have any difficulties with his job. He states even when he gets short of breath with exercise he is able to persist through without healing faint or worsening of condition.   Review of Systems   GEN- denies fatigue, fever, weight loss,weakness, recent illness CVS- denies chest pain, palpitations RESP- + SOB, denies cough, wheeze ABD- denies N/V, change in stools, abd pain GU- denies dysuria, hematuria, dribbling,  incontinence MSK- occ knee joint pain, denies muscle aches, injury Neuro- denies headache, dizziness, syncope, seizure activity      Objective:   Physical Exam GEN- NAD, alert and oriented x3, very pleasant HEENT- PERRL, EOMI, non injected sclera, pink conjunctiva, MMM, oropharynx clear Neck- Supple,  no carotid bruit CVS- RRR, no murmur RESP-CTAB, normal WOB EXT- No edema Pulses- Radial, DP- 2+        Assessment & Plan:

## 2010-09-03 NOTE — Assessment & Plan Note (Signed)
No current meds will have patient come in for fasting lipid panel we will review this next week

## 2010-09-03 NOTE — Assessment & Plan Note (Addendum)
With his labs which will be drawn I will obtain a vitamin D level. Pending the result, I will start him on calcium and vitamin D supplement

## 2010-09-03 NOTE — Assessment & Plan Note (Signed)
Patient was not aware of diagnosis. I will obtain an A1c before starting any medication on him at this time. He will return next week to review results. If needed I will start him on a glucose meter at that time

## 2010-09-03 NOTE — Patient Instructions (Signed)
Come in to have your blood work done on Monday- Do not eat after midnight I will check your diabetes number, cholesterol, kidney, Vit D and liver function Take the blood pressure pill once a day  Start a baby aspirin- I sent this to the pharmacy Make a follow-up appt Next Thursday or Friday

## 2010-09-03 NOTE — Assessment & Plan Note (Signed)
He shortness of breath at this time appears to be very mild. He is able to persist through his regular activities and that he typically occurs with his exercise program. He had a normal lung exam today his O2 sat was normal. I will continue to keep and I am this if it worsens I will obtain a chest x-ray and perform an echocardiogram with his history of hypertension diabetes.

## 2010-09-03 NOTE — Assessment & Plan Note (Signed)
His blood pressure is optimal today even with diagnoses of diabetes. He is currently taking one tablet a day of lisinopril/HCTZ. I will continue at this dose

## 2010-09-09 LAB — COMPREHENSIVE METABOLIC PANEL
ALT: <8 U/L (ref 0–53)
Albumin: 4 g/dL (ref 3.5–5.2)
Alkaline Phosphatase: 70 U/L (ref 39–117)
BUN: 16 mg/dL (ref 6–23)
Calcium: 9.1 mg/dL (ref 8.4–10.5)
Sodium: 141 mEq/L (ref 135–145)
Total Bilirubin: 1 mg/dL (ref 0.3–1.2)
Total Protein: 6.8 g/dL (ref 6.0–8.3)

## 2010-09-09 LAB — LIPID PANEL
Cholesterol: 196 mg/dL (ref 0–200)
LDL Cholesterol: 112 mg/dL — ABNORMAL HIGH (ref 0–99)
Triglycerides: 111 mg/dL (ref <150)

## 2010-09-11 LAB — VITAMIN D 1,25 DIHYDROXY: Vitamin D2 1, 25 (OH)2: <8 pg/mL

## 2010-09-12 ENCOUNTER — Ambulatory Visit: Payer: BC Managed Care – PPO | Admitting: Family Medicine

## 2010-09-17 ENCOUNTER — Encounter: Payer: Self-pay | Admitting: Family Medicine

## 2010-09-17 ENCOUNTER — Ambulatory Visit (INDEPENDENT_AMBULATORY_CARE_PROVIDER_SITE_OTHER): Payer: BC Managed Care – PPO | Admitting: Family Medicine

## 2010-09-17 VITALS — BP 126/82 | HR 70 | Ht 68.75 in | Wt 216.1 lb

## 2010-09-17 DIAGNOSIS — E119 Type 2 diabetes mellitus without complications: Secondary | ICD-10-CM

## 2010-09-17 DIAGNOSIS — I10 Essential (primary) hypertension: Secondary | ICD-10-CM

## 2010-09-17 DIAGNOSIS — E785 Hyperlipidemia, unspecified: Secondary | ICD-10-CM

## 2010-09-17 DIAGNOSIS — E559 Vitamin D deficiency, unspecified: Secondary | ICD-10-CM

## 2010-09-17 MED ORDER — SIMVASTATIN 10 MG PO TABS: ORAL_TABLET | ORAL | Status: DC

## 2010-09-17 MED ORDER — METFORMIN HCL ER 500 MG PO TB24: 500.0000 mg | ORAL_TABLET | Freq: Every day | ORAL | Status: DC

## 2010-09-17 NOTE — Assessment & Plan Note (Signed)
Patient goal for blood pressure control. Continue current medication. Renal function was normal

## 2010-09-17 NOTE — Assessment & Plan Note (Signed)
I will start him on metformin once a day. At this time I do not think he needs to check his blood sugars daily. I will send him for another A1c in approximately 6 months. If we notice that his A1c is worsening then we will start fasting CBGs. If he has any problems or symptoms suggestive of hypoglycemia they will start CBG. We discussed diet

## 2010-09-17 NOTE — Assessment & Plan Note (Signed)
His vitamin D levels are normal. He will pick up of vitamin with calcium and vitamin D

## 2010-09-17 NOTE — Assessment & Plan Note (Signed)
Goal LDL less than 70. We will start low-dose simvastatin

## 2010-09-17 NOTE — Progress Notes (Signed)
  Subjective:    Patient ID: Phillip Wilson, male    DOB: 01-28-1946, 65 y.o.   MRN: [id number]  HPI Patient is here to followup his labs and medications  Diabetes- A1c was 6.7% his previous A1c in 2011 was 6.6%. He was advised he does have a diagnosis of diabetes. He is currently in the wellness program in [place]. Note he was on metformin in the past  Hypertension- he has been doing well on his blood pressure medication. He has no concerns and no side effects.  Hyperlipidemia- overall his cholesterol was within normal limits. His LDL was slightly elevated at 112. His goal would be less than 70   Review of Systems - no recent chest pain, shortness of breath, headache, leg swelling, denies polyuria, polydipsia     Objective:   Physical Exam  GEN-NAD,alert and oriented x3, pleasant      Assessment & Plan:

## 2010-09-17 NOTE — Patient Instructions (Signed)
Continue your blood pressure medication Your kidney function and liver function were normal Start the diabetes pill once a day.- Call if you have diarrhea or upset stomach Your A1C ( this is your diabetes number) was 6.7% Start the cholesterol pill- Simvastatin, take at bedtime- call if you have muscle aches Your bad cholesterol number was 112. Start the baby aspirin once a day Take a multivitamin with calcium and Vit D.  Call and get your flu shot Call [employer] - your insurance about the shingles vaccine Next visit in 6 months

## 2010-12-28 DIAGNOSIS — I4891 Unspecified atrial fibrillation: Secondary | ICD-10-CM | POA: Insufficient documentation

## 2010-12-28 DIAGNOSIS — I4819 Other persistent atrial fibrillation: Secondary | ICD-10-CM | POA: Insufficient documentation

## 2010-12-28 DIAGNOSIS — I429 Cardiomyopathy, unspecified: Secondary | ICD-10-CM | POA: Insufficient documentation

## 2010-12-28 DIAGNOSIS — I480 Paroxysmal atrial fibrillation: Secondary | ICD-10-CM | POA: Insufficient documentation

## 2011-01-21 ENCOUNTER — Other Ambulatory Visit: Payer: Self-pay

## 2011-01-21 ENCOUNTER — Inpatient Hospital Stay (HOSPITAL_COMMUNITY)
Admission: EM | Admit: 2011-01-21 | Discharge: 2011-01-26 | DRG: 127 | Disposition: A | Discharge status: Home / Self Care | Payer: BC Managed Care – PPO | Attending: Internal Medicine | Admitting: Internal Medicine

## 2011-01-21 ENCOUNTER — Emergency Department (HOSPITAL_COMMUNITY): Payer: BC Managed Care – PPO

## 2011-01-21 ENCOUNTER — Encounter (HOSPITAL_COMMUNITY): Payer: Self-pay | Admitting: *Deleted

## 2011-01-21 DIAGNOSIS — R0602 Shortness of breath: Secondary | ICD-10-CM

## 2011-01-21 DIAGNOSIS — D696 Thrombocytopenia, unspecified: Secondary | ICD-10-CM

## 2011-01-21 DIAGNOSIS — R7989 Other specified abnormal findings of blood chemistry: Secondary | ICD-10-CM

## 2011-01-21 DIAGNOSIS — M25569 Pain in unspecified knee: Secondary | ICD-10-CM

## 2011-01-21 DIAGNOSIS — R001 Bradycardia, unspecified: Secondary | ICD-10-CM

## 2011-01-21 DIAGNOSIS — S40019A Contusion of unspecified shoulder, initial encounter: Secondary | ICD-10-CM

## 2011-01-21 DIAGNOSIS — I3139 Other pericardial effusion (noninflammatory): Secondary | ICD-10-CM

## 2011-01-21 DIAGNOSIS — E785 Hyperlipidemia, unspecified: Secondary | ICD-10-CM

## 2011-01-21 DIAGNOSIS — I5021 Acute systolic (congestive) heart failure: Principal | ICD-10-CM

## 2011-01-21 DIAGNOSIS — I4891 Unspecified atrial fibrillation: Secondary | ICD-10-CM

## 2011-01-21 DIAGNOSIS — E873 Alkalosis: Secondary | ICD-10-CM

## 2011-01-21 DIAGNOSIS — I313 Pericardial effusion (noninflammatory): Secondary | ICD-10-CM

## 2011-01-21 DIAGNOSIS — J189 Pneumonia, unspecified organism: Secondary | ICD-10-CM

## 2011-01-21 DIAGNOSIS — K219 Gastro-esophageal reflux disease without esophagitis: Secondary | ICD-10-CM

## 2011-01-21 DIAGNOSIS — E119 Type 2 diabetes mellitus without complications: Secondary | ICD-10-CM

## 2011-01-21 DIAGNOSIS — E782 Mixed hyperlipidemia: Secondary | ICD-10-CM | POA: Diagnosis present

## 2011-01-21 DIAGNOSIS — I319 Disease of pericardium, unspecified: Secondary | ICD-10-CM | POA: Diagnosis present

## 2011-01-21 DIAGNOSIS — I509 Heart failure, unspecified: Secondary | ICD-10-CM | POA: Diagnosis present

## 2011-01-21 DIAGNOSIS — N289 Disorder of kidney and ureter, unspecified: Secondary | ICD-10-CM

## 2011-01-21 DIAGNOSIS — E669 Obesity, unspecified: Secondary | ICD-10-CM

## 2011-01-21 DIAGNOSIS — R0600 Dyspnea, unspecified: Secondary | ICD-10-CM | POA: Diagnosis present

## 2011-01-21 DIAGNOSIS — I10 Essential (primary) hypertension: Secondary | ICD-10-CM

## 2011-01-21 LAB — POCT I-STAT TROPONIN I

## 2011-01-21 LAB — PROTIME-INR: Prothrombin Time: 15.2 seconds (ref 11.6–15.2)

## 2011-01-21 LAB — HEPATIC FUNCTION PANEL
ALT: 36 U/L (ref 0–53)
Alkaline Phosphatase: 109 U/L (ref 39–117)
Bilirubin, Direct: 0.3 mg/dL (ref 0.0–0.3)
Indirect Bilirubin: 1.3 mg/dL — ABNORMAL HIGH (ref 0.3–0.9)

## 2011-01-21 LAB — URINALYSIS, ROUTINE W REFLEX MICROSCOPIC
Bilirubin Urine: NEGATIVE
Glucose, UA: NEGATIVE mg/dL
Hgb urine dipstick: NEGATIVE
Specific Gravity, Urine: >1.03 — ABNORMAL HIGH (ref 1.005–1.030)
pH: 6 (ref 5.0–8.0)

## 2011-01-21 LAB — BASIC METABOLIC PANEL
BUN: 14 mg/dL (ref 6–23)
Chloride: 105 mEq/L (ref 96–112)
Creatinine, Ser: 1.24 mg/dL (ref 0.50–1.35)
GFR calc Af Amer: 70 mL/min — ABNORMAL LOW (ref >90)

## 2011-01-21 LAB — CBC
HCT: 46.2 % (ref 39.0–52.0)
MCH: 28.3 pg (ref 26.0–34.0)

## 2011-01-21 LAB — DIFFERENTIAL
Basophils Absolute: 0 10*3/uL (ref 0.0–0.1)
Basophils Relative: 0 % (ref 0–1)
Monocytes Relative: 7 % (ref 3–12)
Neutrophils Relative %: 73 % (ref 43–77)

## 2011-01-21 LAB — TSH: TSH: 2.979 u[IU]/mL (ref 0.350–4.500)

## 2011-01-21 MED ORDER — FUROSEMIDE 10 MG/ML IJ SOLN
40.0000 mg | Freq: Once | INTRAMUSCULAR | Status: AC
  Administered 2011-01-21: 40 mg via INTRAVENOUS
  Filled 2011-01-21: qty 4

## 2011-01-21 MED ORDER — METFORMIN HCL ER 500 MG PO TB24
500.0000 mg | ORAL_TABLET | Freq: Every day | ORAL | Status: DC
  Administered 2011-01-22 – 2011-01-26 (×5): 500 mg via ORAL
  Filled 2011-01-21 (×7): qty 1

## 2011-01-21 MED ORDER — ACETAMINOPHEN 650 MG RE SUPP: 650.0000 mg | Freq: Four times a day (QID) | RECTAL | Status: DC | PRN

## 2011-01-21 MED ORDER — VITAMIN B-1 100 MG PO TABS
100.0000 mg | ORAL_TABLET | Freq: Every day | ORAL | Status: DC
  Administered 2011-01-21 – 2011-01-26 (×6): 100 mg via ORAL
  Filled 2011-01-21 (×6): qty 1

## 2011-01-21 MED ORDER — DEXTROSE 5 % IV SOLN
1.0000 g | INTRAVENOUS | Status: DC
  Administered 2011-01-22: 1 g via INTRAVENOUS
  Filled 2011-01-21 (×2): qty 10

## 2011-01-21 MED ORDER — ALUM & MAG HYDROXIDE-SIMETH 200-200-20 MG/5ML PO SUSP: 30.0000 mL | Freq: Four times a day (QID) | ORAL | Status: DC | PRN

## 2011-01-21 MED ORDER — DEXTROSE 5 % IV SOLN
1.0000 g | INTRAVENOUS | Status: DC
  Administered 2011-01-21: 1 g via INTRAVENOUS
  Filled 2011-01-21 (×2): qty 10

## 2011-01-21 MED ORDER — ASPIRIN EC 81 MG PO TBEC
81.0000 mg | DELAYED_RELEASE_TABLET | Freq: Every day | ORAL | Status: DC
  Administered 2011-01-21 – 2011-01-26 (×6): 81 mg via ORAL
  Filled 2011-01-21 (×8): qty 1

## 2011-01-21 MED ORDER — ONDANSETRON HCL 4 MG/2ML IJ SOLN: 4.0000 mg | Freq: Four times a day (QID) | INTRAMUSCULAR | Status: DC | PRN

## 2011-01-21 MED ORDER — GUAIFENESIN-DM 100-10 MG/5ML PO SYRP: 5.0000 mL | ORAL_SOLUTION | ORAL | Status: DC | PRN

## 2011-01-21 MED ORDER — TRAZODONE HCL 50 MG PO TABS
50.0000 mg | ORAL_TABLET | Freq: Every evening | ORAL | Status: DC | PRN
  Administered 2011-01-25: 50 mg via ORAL
  Filled 2011-01-21: qty 1

## 2011-01-21 MED ORDER — AZITHROMYCIN 250 MG PO TABS
250.0000 mg | ORAL_TABLET | Freq: Every day | ORAL | Status: DC
  Administered 2011-01-22: 250 mg via ORAL
  Filled 2011-01-21: qty 1

## 2011-01-21 MED ORDER — DILTIAZEM HCL 100 MG IV SOLR
15.0000 mg/h | INTRAVENOUS | Status: DC
  Administered 2011-01-21 – 2011-01-23 (×7): 15 mg/h via INTRAVENOUS

## 2011-01-21 MED ORDER — METOPROLOL TARTRATE 1 MG/ML IV SOLN
5.0000 mg | Freq: Four times a day (QID) | INTRAVENOUS | Status: DC
  Administered 2011-01-21 – 2011-01-22 (×4): 5 mg via INTRAVENOUS
  Filled 2011-01-21 (×3): qty 5

## 2011-01-21 MED ORDER — SODIUM CHLORIDE 0.9 % IV SOLN: INTRAVENOUS | Status: DC

## 2011-01-21 MED ORDER — ONDANSETRON HCL 4 MG PO TABS: 4.0000 mg | ORAL_TABLET | Freq: Four times a day (QID) | ORAL | Status: DC | PRN

## 2011-01-21 MED ORDER — ENOXAPARIN SODIUM 40 MG/0.4ML ~~LOC~~ SOLN
40.0000 mg | Freq: Every day | SUBCUTANEOUS | Status: DC
  Administered 2011-01-21 – 2011-01-22 (×2): 40 mg via SUBCUTANEOUS
  Filled 2011-01-21 (×2): qty 0.4

## 2011-01-21 MED ORDER — ALBUTEROL SULFATE (5 MG/ML) 0.5% IN NEBU: 2.5000 mg | INHALATION_SOLUTION | RESPIRATORY_TRACT | Status: DC | PRN

## 2011-01-21 MED ORDER — SENNA 8.6 MG PO TABS: 2.0000 | ORAL_TABLET | Freq: Every day | ORAL | Status: DC | PRN

## 2011-01-21 MED ORDER — INFLUENZA VIRUS VACC SPLIT PF IM SUSP
0.5000 mL | INTRAMUSCULAR | Status: AC
  Administered 2011-01-22: 0.5 mL via INTRAMUSCULAR
  Filled 2011-01-21: qty 0.5

## 2011-01-21 MED ORDER — DILTIAZEM HCL 100 MG IV SOLR
5.0000 mg/h | Freq: Once | INTRAVENOUS | Status: AC
  Administered 2011-01-21: 5 mg/h via INTRAVENOUS

## 2011-01-21 MED ORDER — DILTIAZEM HCL 25 MG/5ML IV SOLN
20.0000 mg | Freq: Once | INTRAVENOUS | Status: AC
  Administered 2011-01-21: 20 mg via INTRAVENOUS

## 2011-01-21 MED ORDER — LORAZEPAM 0.5 MG PO TABS: 1.0000 mg | ORAL_TABLET | ORAL | Status: DC | PRN

## 2011-01-21 MED ORDER — DEXTROSE 5 % IV SOLN
500.0000 mg | INTRAVENOUS | Status: DC
  Administered 2011-01-21: 500 mg via INTRAVENOUS
  Filled 2011-01-21: qty 500

## 2011-01-21 MED ORDER — DILTIAZEM HCL 100 MG IV SOLR
5.0000 mg/h | INTRAVENOUS | Status: DC
  Filled 2011-01-21: qty 100

## 2011-01-21 MED ORDER — METOPROLOL TARTRATE 1 MG/ML IV SOLN
INTRAVENOUS | Status: AC
  Administered 2011-01-21: 5 mg via INTRAVENOUS
  Filled 2011-01-21: qty 5

## 2011-01-21 MED ORDER — SIMVASTATIN 20 MG PO TABS
10.0000 mg | ORAL_TABLET | Freq: Every day | ORAL | Status: DC
  Administered 2011-01-21: 17:00:00 via ORAL
  Administered 2011-01-22 – 2011-01-25 (×4): 10 mg via ORAL
  Filled 2011-01-21 (×5): qty 1

## 2011-01-21 MED ORDER — ACETAMINOPHEN 325 MG PO TABS: 650.0000 mg | ORAL_TABLET | Freq: Four times a day (QID) | ORAL | Status: DC | PRN

## 2011-01-21 NOTE — ED Notes (Signed)
Awaiting blood culture draw prior to starting antibiotics
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
## 2011-01-21 NOTE — ED Notes (Signed)
Cardizem bolus completed. Cardizem infusing at 5 mg/hour. HR 135-165 after bolus. Patient remains alert/oriented x 4. Denies chest pain.

## 2011-01-21 NOTE — ED Notes (Signed)
Dr. Zackowski at bedside  

## 2011-01-21 NOTE — ED Notes (Signed)
Report given to Sharyn Lull, RN in ICU. Ready to receive patient.

## 2011-01-21 NOTE — ED Notes (Signed)
Patient with irregular heart rate 160-190. Patient short of breath, dyspnea at rest. Patient in Afib on monitor. Dr Rogene Houston made aware. Awaiting MD eval.
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
## 2011-01-21 NOTE — ED Notes (Signed)
Pt states productive cough, green in color along with SOB x  1 week.

## 2011-01-22 ENCOUNTER — Encounter (HOSPITAL_COMMUNITY): Payer: Self-pay | Admitting: Adult Health

## 2011-01-22 ENCOUNTER — Inpatient Hospital Stay (HOSPITAL_COMMUNITY): Payer: BC Managed Care – PPO

## 2011-01-22 DIAGNOSIS — I5021 Acute systolic (congestive) heart failure: Principal | ICD-10-CM | POA: Diagnosis present

## 2011-01-22 DIAGNOSIS — I4891 Unspecified atrial fibrillation: Secondary | ICD-10-CM

## 2011-01-22 DIAGNOSIS — I319 Disease of pericardium, unspecified: Secondary | ICD-10-CM

## 2011-01-22 LAB — GLUCOSE, CAPILLARY: Glucose-Capillary: 165 mg/dL — ABNORMAL HIGH (ref 70–99)

## 2011-01-22 LAB — TSH: TSH: 1.425 u[IU]/mL (ref 0.350–4.500)

## 2011-01-22 MED ORDER — ENOXAPARIN SODIUM 80 MG/0.8ML ~~LOC~~ SOLN
1.0000 mg/kg | Freq: Two times a day (BID) | SUBCUTANEOUS | Status: DC
  Administered 2011-01-22 – 2011-01-23 (×2): 110 mg via SUBCUTANEOUS
  Filled 2011-01-22 (×3): qty 0.3

## 2011-01-22 MED ORDER — METOPROLOL TARTRATE 50 MG PO TABS
50.0000 mg | ORAL_TABLET | Freq: Two times a day (BID) | ORAL | Status: DC
  Administered 2011-01-22 (×2): 50 mg via ORAL
  Filled 2011-01-22 (×2): qty 1

## 2011-01-22 MED ORDER — FUROSEMIDE 10 MG/ML IJ SOLN
40.0000 mg | Freq: Two times a day (BID) | INTRAMUSCULAR | Status: DC
  Administered 2011-01-22 – 2011-01-23 (×3): 40 mg via INTRAVENOUS
  Filled 2011-01-22 (×3): qty 4

## 2011-01-22 NOTE — Progress Notes (Signed)
*  PRELIMINARY RESULTS* Echocardiogram 2D Echocardiogram has been performed.  Phillip Wilson 01/22/2011, 12:14 PM
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
## 2011-01-23 LAB — BASIC METABOLIC PANEL
BUN: 12 mg/dL (ref 6–23)
CO2: 31 mEq/L (ref 19–32)
Calcium: 8.9 mg/dL (ref 8.4–10.5)
Creatinine, Ser: 1.14 mg/dL (ref 0.50–1.35)
Glucose, Bld: 120 mg/dL — ABNORMAL HIGH (ref 70–99)

## 2011-01-23 LAB — GLUCOSE, CAPILLARY
Glucose-Capillary: 151 mg/dL — ABNORMAL HIGH (ref 70–99)
Glucose-Capillary: 113 mg/dL — ABNORMAL HIGH (ref 70–99)
Glucose-Capillary: 133 mg/dL — ABNORMAL HIGH (ref 70–99)

## 2011-01-23 LAB — LIPID PANEL
Cholesterol: 107 mg/dL (ref 0–200)
Triglycerides: 68 mg/dL (ref <150)

## 2011-01-23 MED ORDER — DILTIAZEM HCL 60 MG PO TABS: 90.0000 mg | ORAL_TABLET | Freq: Four times a day (QID) | ORAL | Status: DC

## 2011-01-23 MED ORDER — METOPROLOL TARTRATE 50 MG PO TABS: 50.0000 mg | ORAL_TABLET | Freq: Four times a day (QID) | ORAL | Status: DC

## 2011-01-23 MED ORDER — POTASSIUM CHLORIDE CRYS ER 20 MEQ PO TBCR
40.0000 meq | EXTENDED_RELEASE_TABLET | Freq: Two times a day (BID) | ORAL | Status: DC
  Administered 2011-01-23: 40 meq via ORAL
  Filled 2011-01-23 (×2): qty 2

## 2011-01-23 MED ORDER — DILTIAZEM HCL 60 MG PO TABS
120.0000 mg | ORAL_TABLET | Freq: Four times a day (QID) | ORAL | Status: DC
  Administered 2011-01-23 – 2011-01-24 (×4): 120 mg via ORAL
  Filled 2011-01-23 (×4): qty 2

## 2011-01-23 MED ORDER — METOPROLOL TARTRATE 25 MG PO TABS
75.0000 mg | ORAL_TABLET | Freq: Two times a day (BID) | ORAL | Status: DC
  Administered 2011-01-23: 75 mg via ORAL
  Filled 2011-01-23: qty 1

## 2011-01-23 MED ORDER — POTASSIUM CHLORIDE 10 MEQ/100ML IV SOLN
10.0000 meq | INTRAVENOUS | Status: DC
  Administered 2011-01-23 (×3): 10 meq via INTRAVENOUS
  Filled 2011-01-23: qty 300

## 2011-01-23 MED ORDER — WARFARIN VIDEO
Freq: Once | Status: AC
  Administered 2011-01-24: 10:00:00

## 2011-01-23 MED ORDER — LISINOPRIL 10 MG PO TABS
5.0000 mg | ORAL_TABLET | Freq: Two times a day (BID) | ORAL | Status: DC
  Administered 2011-01-23 – 2011-01-26 (×6): 5 mg via ORAL
  Filled 2011-01-23 (×4): qty 1
  Filled 2011-01-23: qty 2
  Filled 2011-01-23: qty 1

## 2011-01-23 MED ORDER — LISINOPRIL 10 MG PO TABS
5.0000 mg | ORAL_TABLET | Freq: Every day | ORAL | Status: DC
  Administered 2011-01-23: 11:00:00 via ORAL
  Filled 2011-01-23: qty 2

## 2011-01-23 MED ORDER — FUROSEMIDE 10 MG/ML IJ SOLN
40.0000 mg | Freq: Three times a day (TID) | INTRAMUSCULAR | Status: DC
  Administered 2011-01-23 (×2): 40 mg via INTRAVENOUS
  Filled 2011-01-23 (×2): qty 4

## 2011-01-23 MED ORDER — POTASSIUM CHLORIDE CRYS ER 20 MEQ PO TBCR
40.0000 meq | EXTENDED_RELEASE_TABLET | ORAL | Status: AC
  Administered 2011-01-23 (×2): 40 meq via ORAL
  Filled 2011-01-23: qty 2

## 2011-01-23 MED ORDER — METOPROLOL TARTRATE 50 MG PO TABS: 50.0000 mg | ORAL_TABLET | Freq: Three times a day (TID) | ORAL | Status: DC

## 2011-01-23 MED ORDER — POTASSIUM CHLORIDE 10 MEQ/100ML IV SOLN
INTRAVENOUS | Status: AC
  Administered 2011-01-23: 10 meq via INTRAVENOUS
  Filled 2011-01-23: qty 100

## 2011-01-23 MED ORDER — WARFARIN SODIUM 5 MG PO TABS
5.0000 mg | ORAL_TABLET | Freq: Once | ORAL | Status: AC
  Administered 2011-01-23: 5 mg via ORAL
  Filled 2011-01-23: qty 1

## 2011-01-23 MED ORDER — SPIRONOLACTONE 25 MG PO TABS
25.0000 mg | ORAL_TABLET | Freq: Every day | ORAL | Status: DC
  Administered 2011-01-23 – 2011-01-26 (×4): 25 mg via ORAL
  Filled 2011-01-23 (×4): qty 1

## 2011-01-23 MED ORDER — METOPROLOL TARTRATE 50 MG PO TABS
50.0000 mg | ORAL_TABLET | Freq: Four times a day (QID) | ORAL | Status: DC
  Administered 2011-01-23 – 2011-01-24 (×4): 50 mg via ORAL
  Filled 2011-01-23 (×5): qty 1

## 2011-01-23 MED ORDER — PATIENT'S GUIDE TO USING COUMADIN BOOK
Freq: Once | Status: AC
  Administered 2011-01-24: 10:00:00
  Filled 2011-01-23: qty 1

## 2011-01-23 MED ORDER — ENOXAPARIN SODIUM 80 MG/0.8ML ~~LOC~~ SOLN
1.0000 mg/kg | Freq: Two times a day (BID) | SUBCUTANEOUS | Status: DC
  Administered 2011-01-23 – 2011-01-26 (×6): 110 mg via SUBCUTANEOUS
  Filled 2011-01-23 (×6): qty 0.3
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
## 2011-01-23 NOTE — Progress Notes (Signed)
Pt started on coumadin this evening for treatment of A. Fib. Verbal teaching given to patient about bleeding precautions. Pt told to report any blood in urine or stool to PCP, as well as using only electric razor for shaving. Pt and girlfriend both expressed understanding. Pt is currently watching the anticoagulation video, and the " Patient's guide to using coumadin" book has been ordered.
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
## 2011-01-23 NOTE — Progress Notes (Signed)
0605-Notified Dr. Megan Salon about increased HR of upper 130's-150's and increased SOB as patient is now awake and sitting on side of bed. Dr. Megan Salon ordered STAT magnesium level.
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
## 2011-01-24 ENCOUNTER — Inpatient Hospital Stay (HOSPITAL_COMMUNITY): Payer: BC Managed Care – PPO

## 2011-01-24 ENCOUNTER — Encounter (HOSPITAL_COMMUNITY): Payer: Self-pay | Admitting: Internal Medicine

## 2011-01-24 DIAGNOSIS — D696 Thrombocytopenia, unspecified: Secondary | ICD-10-CM | POA: Diagnosis not present

## 2011-01-24 DIAGNOSIS — I3139 Other pericardial effusion (noninflammatory): Secondary | ICD-10-CM | POA: Diagnosis present

## 2011-01-24 DIAGNOSIS — I313 Pericardial effusion (noninflammatory): Secondary | ICD-10-CM | POA: Diagnosis present

## 2011-01-24 LAB — GLUCOSE, CAPILLARY
Glucose-Capillary: 108 mg/dL — ABNORMAL HIGH (ref 70–99)
Glucose-Capillary: 119 mg/dL — ABNORMAL HIGH (ref 70–99)
Glucose-Capillary: 120 mg/dL — ABNORMAL HIGH (ref 70–99)
Glucose-Capillary: 138 mg/dL — ABNORMAL HIGH (ref 70–99)

## 2011-01-24 LAB — BASIC METABOLIC PANEL
Calcium: 9.5 mg/dL (ref 8.4–10.5)
GFR calc non Af Amer: 61 mL/min — ABNORMAL LOW (ref >90)
Glucose, Bld: 110 mg/dL — ABNORMAL HIGH (ref 70–99)
Sodium: 140 mEq/L (ref 135–145)

## 2011-01-24 LAB — CBC
HCT: 43.3 % (ref 39.0–52.0)
Hemoglobin: 13.9 g/dL (ref 13.0–17.0)
RBC: 4.89 MIL/uL (ref 4.22–5.81)
WBC: 7.7 10*3/uL (ref 4.0–10.5)

## 2011-01-24 LAB — PROTIME-INR
INR: 1.19 (ref 0.00–1.49)
Prothrombin Time: 15.4 seconds — ABNORMAL HIGH (ref 11.6–15.2)

## 2011-01-24 MED ORDER — WARFARIN SODIUM 7.5 MG PO TABS
7.5000 mg | ORAL_TABLET | Freq: Once | ORAL | Status: AC
  Administered 2011-01-24: 7.5 mg via ORAL
  Filled 2011-01-24: qty 1

## 2011-01-24 MED ORDER — DILTIAZEM HCL 60 MG PO TABS
90.0000 mg | ORAL_TABLET | Freq: Four times a day (QID) | ORAL | Status: DC
  Administered 2011-01-24 (×2): 90 mg via ORAL
  Filled 2011-01-24 (×2): qty 1

## 2011-01-24 MED ORDER — DIGOXIN 0.25 MG/ML IJ SOLN
0.5000 mg | Freq: Every day | INTRAMUSCULAR | Status: AC
  Administered 2011-01-24: 0.5 mg via INTRAVENOUS
  Filled 2011-01-24: qty 2

## 2011-01-24 MED ORDER — FUROSEMIDE 10 MG/ML IJ SOLN
60.0000 mg | Freq: Four times a day (QID) | INTRAMUSCULAR | Status: DC
  Administered 2011-01-24 (×4): 60 mg via INTRAVENOUS
  Filled 2011-01-24 (×4): qty 6

## 2011-01-24 MED ORDER — METOPROLOL TARTRATE 50 MG PO TABS
50.0000 mg | ORAL_TABLET | Freq: Three times a day (TID) | ORAL | Status: DC
  Administered 2011-01-24: 50 mg via ORAL
  Filled 2011-01-24: qty 1

## 2011-01-24 MED ORDER — DIGOXIN 0.25 MG/ML IJ SOLN: 0.2500 mg | Freq: Two times a day (BID) | INTRAMUSCULAR | Status: DC

## 2011-01-24 MED ORDER — DIGOXIN 0.25 MG/ML IJ SOLN
0.2500 mg | Freq: Two times a day (BID) | INTRAMUSCULAR | Status: DC
  Administered 2011-01-24: 0.25 mg via INTRAVENOUS
  Filled 2011-01-24: qty 2

## 2011-01-24 MED ORDER — DIGOXIN 250 MCG PO TABS
0.2500 mg | ORAL_TABLET | Freq: Every day | ORAL | Status: DC
  Administered 2011-01-25 – 2011-01-26 (×2): 0.25 mg via ORAL
  Filled 2011-01-24 (×2): qty 1

## 2011-01-24 MED ORDER — SODIUM CHLORIDE 0.9 % IJ SOLN
INTRAMUSCULAR | Status: AC
  Administered 2011-01-24: 3 mL
  Filled 2011-01-24: qty 9

## 2011-01-24 MED ORDER — DIGOXIN 0.25 MG/ML IJ SOLN
0.2500 mg | Freq: Two times a day (BID) | INTRAMUSCULAR | Status: AC
  Administered 2011-01-24: 0.25 mg via INTRAVENOUS
  Filled 2011-01-24: qty 2

## 2011-01-24 NOTE — Progress Notes (Signed)
Called Dr. Megan Salon about pt's BP (90s/60s) around 2200; was told to hold metoprolol
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
## 2011-01-24 NOTE — Consult Note (Signed)
Phillip Wilson  Cardiology  Patient reevaluated. He continues to improve as does his heart rate, currently down to 90 bpm after 2 initial doses of digoxin. Net diuresis of 1 L achieved. Oxygen has been discontinued, and ambulation was well tolerated. If heart rate control is good in the morning, he can be dischanged on metoprolol 75 mg twice a day and diltiazem CD 360 mg per day plus his current digoxin dose and discharge to home. Pharmacy can provide instructions for warfarin dosage over the weekend, and he can return to clinic on Monday for an INR. He need not continue enoxaparin after discharge.  Furosemide dosage can be 60 mg daily.  [name] 01/24/2011, 3:56 PM

## 2011-01-24 NOTE — Progress Notes (Addendum)
Pt's HR increasing to 100s-130s (has A-fib); scheduled po cardizem given earlier in the shift before d/c cardizem drip; called Dr. Megan Salon and was told to give the previously held metoprolol
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
## 2011-01-25 DIAGNOSIS — E873 Alkalosis: Secondary | ICD-10-CM | POA: Diagnosis not present

## 2011-01-25 DIAGNOSIS — R001 Bradycardia, unspecified: Secondary | ICD-10-CM | POA: Diagnosis not present

## 2011-01-25 LAB — GLUCOSE, CAPILLARY
Glucose-Capillary: 101 mg/dL — ABNORMAL HIGH (ref 70–99)
Glucose-Capillary: 141 mg/dL — ABNORMAL HIGH (ref 70–99)

## 2011-01-25 LAB — BASIC METABOLIC PANEL
BUN: 12 mg/dL (ref 6–23)
CO2: 37 mEq/L — ABNORMAL HIGH (ref 19–32)
Calcium: 10 mg/dL (ref 8.4–10.5)
Chloride: 95 mEq/L — ABNORMAL LOW (ref 96–112)
Creatinine, Ser: 1.29 mg/dL (ref 0.50–1.35)
Glucose, Bld: 120 mg/dL — ABNORMAL HIGH (ref 70–99)

## 2011-01-25 LAB — CBC
HCT: 46.6 % (ref 39.0–52.0)
Hemoglobin: 15.3 g/dL (ref 13.0–17.0)
MCH: 28.7 pg (ref 26.0–34.0)
RBC: 5.33 MIL/uL (ref 4.22–5.81)

## 2011-01-25 LAB — PROTIME-INR: Prothrombin Time: 15.4 seconds — ABNORMAL HIGH (ref 11.6–15.2)

## 2011-01-25 MED ORDER — WARFARIN SODIUM 7.5 MG PO TABS
7.5000 mg | ORAL_TABLET | Freq: Once | ORAL | Status: AC
  Administered 2011-01-25: 7.5 mg via ORAL
  Filled 2011-01-25: qty 1

## 2011-01-25 MED ORDER — DOCUSATE SODIUM 100 MG PO CAPS
100.0000 mg | ORAL_CAPSULE | Freq: Two times a day (BID) | ORAL | Status: DC
  Administered 2011-01-25 – 2011-01-26 (×3): 100 mg via ORAL
  Filled 2011-01-25 (×3): qty 1

## 2011-01-25 MED ORDER — METOPROLOL TARTRATE 50 MG PO TABS
50.0000 mg | ORAL_TABLET | Freq: Once | ORAL | Status: AC
  Administered 2011-01-25: 50 mg via ORAL
  Filled 2011-01-25: qty 1

## 2011-01-25 MED ORDER — FUROSEMIDE 80 MG PO TABS: 80.0000 mg | ORAL_TABLET | Freq: Two times a day (BID) | ORAL | Status: DC

## 2011-01-25 MED ORDER — POTASSIUM CHLORIDE CRYS ER 20 MEQ PO TBCR
20.0000 meq | EXTENDED_RELEASE_TABLET | Freq: Two times a day (BID) | ORAL | Status: DC
  Administered 2011-01-25 – 2011-01-26 (×3): 20 meq via ORAL
  Filled 2011-01-25 (×3): qty 1

## 2011-01-25 MED ORDER — DILTIAZEM HCL ER COATED BEADS 180 MG PO CP24
360.0000 mg | ORAL_CAPSULE | Freq: Every day | ORAL | Status: DC
  Administered 2011-01-25 – 2011-01-26 (×2): 360 mg via ORAL
  Filled 2011-01-25 (×2): qty 2

## 2011-01-25 MED ORDER — FUROSEMIDE 80 MG PO TABS: 80.0000 mg | ORAL_TABLET | Freq: Every day | ORAL | Status: DC

## 2011-01-25 MED ORDER — METOPROLOL TARTRATE 50 MG PO TABS
50.0000 mg | ORAL_TABLET | Freq: Three times a day (TID) | ORAL | Status: DC
  Administered 2011-01-25 – 2011-01-26 (×3): 50 mg via ORAL
  Filled 2011-01-25 (×4): qty 1

## 2011-01-25 MED ORDER — FUROSEMIDE 80 MG PO TABS
80.0000 mg | ORAL_TABLET | Freq: Two times a day (BID) | ORAL | Status: DC
  Administered 2011-01-25 – 2011-01-26 (×3): 80 mg via ORAL
  Filled 2011-01-25 (×3): qty 1
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
## 2011-01-26 DIAGNOSIS — N289 Disorder of kidney and ureter, unspecified: Secondary | ICD-10-CM | POA: Diagnosis not present

## 2011-01-26 LAB — BASIC METABOLIC PANEL
BUN: 13 mg/dL (ref 6–23)
Creatinine, Ser: 1.37 mg/dL — ABNORMAL HIGH (ref 0.50–1.35)
GFR calc Af Amer: 62 mL/min — ABNORMAL LOW (ref >90)
GFR calc non Af Amer: 53 mL/min — ABNORMAL LOW (ref >90)

## 2011-01-26 LAB — CULTURE, BLOOD (ROUTINE X 2): Culture: NO GROWTH

## 2011-01-26 LAB — GLUCOSE, CAPILLARY

## 2011-01-26 MED ORDER — SPIRONOLACTONE 25 MG PO TABS: 25.0000 mg | ORAL_TABLET | Freq: Every day | ORAL | Status: DC

## 2011-01-26 MED ORDER — DIGOXIN 250 MCG PO TABS: 0.2500 mg | ORAL_TABLET | Freq: Every day | ORAL | Status: DC

## 2011-01-26 MED ORDER — METOPROLOL TARTRATE 1 MG/ML IV SOLN
5.0000 mg | Freq: Once | INTRAVENOUS | Status: DC
  Filled 2011-01-26: qty 5

## 2011-01-26 MED ORDER — WARFARIN SODIUM 5 MG PO TABS: 5.0000 mg | ORAL_TABLET | Freq: Once | ORAL | Status: DC

## 2011-01-26 MED ORDER — DILTIAZEM HCL ER COATED BEADS 360 MG PO CP24: 360.0000 mg | ORAL_CAPSULE | Freq: Every day | ORAL | Status: DC

## 2011-01-26 MED ORDER — LISINOPRIL 5 MG PO TABS: 5.0000 mg | ORAL_TABLET | Freq: Two times a day (BID) | ORAL | Status: DC

## 2011-01-26 MED ORDER — FUROSEMIDE 80 MG PO TABS: 80.0000 mg | ORAL_TABLET | Freq: Every day | ORAL | Status: DC

## 2011-01-26 MED ORDER — WARFARIN SODIUM 2.5 MG PO TABS: ORAL_TABLET | ORAL | Status: DC

## 2011-01-26 MED ORDER — METOPROLOL TARTRATE 50 MG PO TABS: 50.0000 mg | ORAL_TABLET | Freq: Once | ORAL | Status: DC

## 2011-01-26 MED ORDER — METOPROLOL TARTRATE 50 MG PO TABS: ORAL_TABLET | ORAL | Status: DC

## 2011-01-26 MED ORDER — POTASSIUM CHLORIDE CRYS ER 20 MEQ PO TBCR: 20.0000 meq | EXTENDED_RELEASE_TABLET | Freq: Two times a day (BID) | ORAL | Status: DC
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
## 2011-01-26 NOTE — Progress Notes (Signed)
eLink Physician-Brief Progress Note Patient Name: Phillip Wilson DOB: 07-09-1945 MRN: [id number]  Date of Service  01/26/2011   HPI/Events of Note   AF c RVR  eICU Interventions  1. Metop 5 IV 2. Additional metop 50 po      Brady Plant 01/26/2011, 5:17 AM
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
## 2011-01-26 NOTE — Progress Notes (Signed)
Provided and reviewed discharge instructions with the pt and his girlfriend.  Also provided nursing care notes on a-fib, CHF, digoxin, diltiazem, furosemide, lisinopril, metoprolol, potassium chloride, spironolactone, warfarin.  Re-iterated to pt the importance of taking medication as directed and also about following up with the doctor to keep PT/INR in effective range.  Also reviewed CHF booklet with the pt and provided a scale for the pt to weigh self at home.  Pt verbalized understanding of information.  Pt taken downstairs to an awaiting car via wheelchair.

## 2011-02-04 ENCOUNTER — Ambulatory Visit (INDEPENDENT_AMBULATORY_CARE_PROVIDER_SITE_OTHER): Payer: BC Managed Care – PPO | Admitting: Adult Health

## 2011-02-04 ENCOUNTER — Encounter: Payer: Self-pay | Admitting: Adult Health

## 2011-02-04 ENCOUNTER — Other Ambulatory Visit: Payer: Self-pay

## 2011-02-04 ENCOUNTER — Telehealth: Payer: Self-pay

## 2011-02-04 DIAGNOSIS — I4891 Unspecified atrial fibrillation: Secondary | ICD-10-CM

## 2011-02-04 DIAGNOSIS — I5021 Acute systolic (congestive) heart failure: Secondary | ICD-10-CM

## 2011-02-04 DIAGNOSIS — Z79899 Other long term (current) drug therapy: Secondary | ICD-10-CM

## 2011-02-04 DIAGNOSIS — I10 Essential (primary) hypertension: Secondary | ICD-10-CM

## 2011-02-04 NOTE — Assessment & Plan Note (Signed)
He remains rate controlled. Will check a INR as he is on coumadin.

## 2011-02-04 NOTE — Patient Instructions (Signed)
Your physician recommends that you schedule a follow-up appointment in: 1 monts

## 2011-02-04 NOTE — Assessment & Plan Note (Signed)
Well controlled at present. No changes at this time.
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
## 2011-02-04 NOTE — Assessment & Plan Note (Signed)
He is clinically very well compensated with no evidence of fluid overload. He is asymptomatic and his wt is down 5 lbs from discharge wt. I will continue his current medications. Will need to have BMET and Dig level drawn for follow-up. He may need to decrease lasix dose from 80 mg daily. He will need follow-up echo in 2 more months to evaluate his LVEF status. He will continue current low salt diet and wts.

## 2011-03-06 ENCOUNTER — Encounter: Payer: Self-pay | Admitting: Cardiology

## 2011-03-06 DIAGNOSIS — K219 Gastro-esophageal reflux disease without esophagitis: Secondary | ICD-10-CM | POA: Insufficient documentation

## 2011-03-06 DIAGNOSIS — E1121 Type 2 diabetes mellitus with diabetic nephropathy: Secondary | ICD-10-CM | POA: Insufficient documentation

## 2011-03-07 ENCOUNTER — Ambulatory Visit: Payer: BC Managed Care – PPO | Admitting: Cardiology

## 2011-03-20 ENCOUNTER — Ambulatory Visit: Payer: BC Managed Care – PPO | Admitting: Family Medicine

## 2011-03-26 ENCOUNTER — Ambulatory Visit (INDEPENDENT_AMBULATORY_CARE_PROVIDER_SITE_OTHER): Payer: BC Managed Care – PPO | Admitting: Cardiology

## 2011-03-26 ENCOUNTER — Ambulatory Visit: Payer: BC Managed Care – PPO | Admitting: Cardiology

## 2011-03-26 ENCOUNTER — Encounter: Payer: Self-pay | Admitting: Cardiology

## 2011-03-26 DIAGNOSIS — E782 Mixed hyperlipidemia: Secondary | ICD-10-CM

## 2011-03-26 DIAGNOSIS — I10 Essential (primary) hypertension: Secondary | ICD-10-CM

## 2011-03-26 DIAGNOSIS — I4891 Unspecified atrial fibrillation: Secondary | ICD-10-CM

## 2011-03-26 DIAGNOSIS — R0602 Shortness of breath: Secondary | ICD-10-CM

## 2011-03-26 DIAGNOSIS — I428 Other cardiomyopathies: Secondary | ICD-10-CM

## 2011-03-26 NOTE — Progress Notes (Signed)
Patient ID: Phillip Wilson, male   DOB: 03/22/1945, 66 y.o.   MRN: [id number] HPI: Scheduled return visit for continued assessment and treatment of paroxysmal atrial fibrillation and recently diagnosed cardiomyopathy.  Since his last visit, Phillip Wilson has done superbly.  He works full-time in Brewing technologist at [employer] without any symptoms during the substantial activity required by his job.  A stress test performed by his PCP one year ago was reportedly negative.  He has reduced alcohol consumption from 4 beers per day to one beer every 2 days.  Prior to Admission medications   Medication Sig Start Date End Date Taking? Authorizing Provider  digoxin (LANOXIN) 0.25 MG tablet Take 1 tablet (0.25 mg total) by mouth daily. TAKE IN THE EVENING FOR YOUR FAST HEART RATE. 01/26/11 01/26/12 Yes Rexene Alberts, MD  diltiazem (CARDIZEM CD) 360 MG 24 hr capsule Take 1 capsule (360 mg total) by mouth daily. 01/26/11 01/26/12 Yes Rexene Alberts, MD  furosemide (LASIX) 80 MG tablet Take 1 tablet (80 mg total) by mouth daily. FOR CONGESTIVE HEART FAILURE. 01/26/11 01/26/12 Yes Rexene Alberts, MD  lisinopril (PRINIVIL,ZESTRIL) 5 MG tablet Take 1 tablet (5 mg total) by mouth 2 (two) times daily. FOR YOUR HEART AND BLOOD PRESSURE 01/26/11 01/26/12 Yes Rexene Alberts, MD  metFORMIN (GLUCOPHAGE XR) 500 MG 24 hr tablet Take 1 tablet (500 mg total) by mouth daily with breakfast. 09/17/10 09/17/11 Yes Vic Blackbird, MD  metoprolol (LOPRESSOR) 50 MG tablet TAKE 1 AND1/2 TABLETS TWO TIMES DAILY FOR YOUR HEART RATE AND BLOOD PRESSURE. 01/26/11  Yes Rexene Alberts, MD  potassium chloride SA (K-DUR,KLOR-CON) 20 MEQ tablet Take 1 tablet (20 mEq total) by mouth 2 (two) times daily. 01/26/11 01/26/12 Yes Rexene Alberts, MD  simvastatin (ZOCOR) 10 MG tablet 1 tablet at bedtime 09/17/10 09/17/14 Yes Vic Blackbird, MD  spironolactone (ALDACTONE) 25 MG tablet Take 1 tablet (25 mg total) by mouth daily. FOR CONGESTIVE HEART FAILURE. 01/26/11  01/26/12 Yes Rexene Alberts, MD  warfarin (COUMADIN) 2.5 MG tablet TAKE TWO TABLETS EVERY EVENING OR AS DIRECTED. "BLOOD THINNER" 01/26/11  Yes Rexene Alberts, MD    No Known Allergies    Past medical history, social history, and family history reviewed and updated.  ROS: Denies orthopnea, PND, pedal edema, lightheadedness, palpitations or syncope.  PHYSICAL EXAM: BP 125/78  Pulse 77  Resp 16  Ht 5\' 8"  (1.727 m)  Wt 96.616 kg (213 lb)  BMI 32.39 kg/m2  General-Well developed; no acute distress Body habitus-Overweight Neck-No JVD; no carotid bruits Lungs-clear lung fields; resonant to percussion Cardiovascular-normal PMI; normal S1 and S2 Abdomen-normal bowel sounds; soft and non-tender without masses or organomegaly Musculoskeletal-No deformities, no cyanosis or clubbing Neurologic-Normal cranial nerves; symmetric strength and tone Skin-Warm, no significant lesions Extremities-distal pulses intact; no edema  ASSESSMENT AND PLAN:  [name], MD 03/26/2011 2:49 PM

## 2011-03-26 NOTE — Assessment & Plan Note (Signed)
Etiology of cardiomyopathy is uncertain.  Patient reports a negative stress test approximately one year before he presented with congestive heart failure.  2 possibilities are a tachycardia-related process secondary to his atrial fibrillation or alcohol-induced.  He was consuming 4 beers per day prior to his hospital admission, but has decreased this by 90%.  Echocardiography will be repeated to reassess left ventricular function now that his arrhythmia and excessive alcohol use had been discontinued.

## 2011-03-26 NOTE — Patient Instructions (Signed)
**Note De-Identified Aaban Griep Obfuscation** Your physician recommends that you return for lab work in: this week  Your physician has requested that you have an echocardiogram. Echocardiography is a painless test that uses sound waves to create images of your heart. It provides your doctor with information about the size and shape of your heart and how well your heart's chambers and valves are working. This procedure takes approximately one hour. There are no restrictions for this procedure.  Your physician recommends that you schedule a follow-up appointment in: 6 months

## 2011-03-26 NOTE — Assessment & Plan Note (Signed)
No recent lipid profiles are available-1 will be obtained.

## 2011-03-26 NOTE — Assessment & Plan Note (Signed)
No symptoms to suggest recurrent atrial fibrillation, which is not present today.  Anticoagulation will be continued until it appears likely that AF will not recur.

## 2011-03-26 NOTE — Assessment & Plan Note (Addendum)
Blood pressure control is excellent.  While his current problems could reflect hypertensive heart disease, hypertension has been fairly mild, and I doubt that this is the etiology of his cardiomyopathic process.

## 2011-03-29 LAB — COMPREHENSIVE METABOLIC PANEL
BUN: 19 mg/dL (ref 6–23)
CO2: 30 mEq/L (ref 19–32)
Creat: 1.17 mg/dL (ref 0.50–1.35)
Glucose, Bld: 113 mg/dL — ABNORMAL HIGH (ref 70–99)
Total Bilirubin: 0.8 mg/dL (ref 0.3–1.2)
Total Protein: 7.2 g/dL (ref 6.0–8.3)

## 2011-03-29 LAB — LIPID PANEL
Cholesterol: 262 mg/dL — ABNORMAL HIGH (ref 0–200)
HDL: 48 mg/dL (ref >39)
Total CHOL/HDL Ratio: 5.5 Ratio
Triglycerides: 284 mg/dL — ABNORMAL HIGH (ref <150)

## 2011-03-31 ENCOUNTER — Other Ambulatory Visit: Payer: Self-pay | Admitting: *Deleted

## 2011-03-31 ENCOUNTER — Encounter: Payer: Self-pay | Admitting: *Deleted

## 2011-03-31 DIAGNOSIS — E782 Mixed hyperlipidemia: Secondary | ICD-10-CM

## 2011-03-31 MED ORDER — ATORVASTATIN CALCIUM 40 MG PO TABS: 40.0000 mg | ORAL_TABLET | Freq: Every day | ORAL | Status: DC

## 2011-04-01 ENCOUNTER — Ambulatory Visit (HOSPITAL_COMMUNITY)
Admission: RE | Admit: 2011-04-01 | Discharge: 2011-04-01 | Disposition: A | Discharge status: Home / Self Care | Payer: BC Managed Care – PPO | Source: Ambulatory Visit | Attending: Cardiology | Admitting: Cardiology

## 2011-04-01 ENCOUNTER — Other Ambulatory Visit: Payer: Self-pay | Admitting: *Deleted

## 2011-04-01 DIAGNOSIS — I428 Other cardiomyopathies: Secondary | ICD-10-CM

## 2011-04-01 DIAGNOSIS — E119 Type 2 diabetes mellitus without complications: Secondary | ICD-10-CM | POA: Insufficient documentation

## 2011-04-01 DIAGNOSIS — I4891 Unspecified atrial fibrillation: Secondary | ICD-10-CM | POA: Insufficient documentation

## 2011-04-01 DIAGNOSIS — I10 Essential (primary) hypertension: Secondary | ICD-10-CM | POA: Insufficient documentation

## 2011-04-01 DIAGNOSIS — I517 Cardiomegaly: Secondary | ICD-10-CM

## 2011-04-01 NOTE — Progress Notes (Signed)
*  PRELIMINARY RESULTS* Echocardiogram 2D Echocardiogram has been performed.  Tera Partridge 04/01/2011, 2:47 PM

## 2011-04-04 ENCOUNTER — Encounter: Payer: Self-pay | Admitting: *Deleted

## 2011-04-10 ENCOUNTER — Ambulatory Visit: Payer: BC Managed Care – PPO | Admitting: Family Medicine

## 2011-04-21 ENCOUNTER — Other Ambulatory Visit: Payer: Self-pay | Admitting: *Deleted

## 2011-04-21 DIAGNOSIS — E782 Mixed hyperlipidemia: Secondary | ICD-10-CM

## 2011-04-29 ENCOUNTER — Other Ambulatory Visit (HOSPITAL_COMMUNITY): Payer: BC Managed Care – PPO

## 2011-05-02 ENCOUNTER — Encounter: Payer: Self-pay | Admitting: *Deleted

## 2011-05-03 LAB — LIPID PANEL
HDL: 54 mg/dL (ref >39)
LDL Cholesterol: 123 mg/dL — ABNORMAL HIGH (ref 0–99)
Total CHOL/HDL Ratio: 3.9 Ratio
Triglycerides: 170 mg/dL — ABNORMAL HIGH (ref <150)
VLDL: 34 mg/dL (ref 0–40)

## 2011-05-06 ENCOUNTER — Other Ambulatory Visit: Payer: Self-pay | Admitting: *Deleted

## 2011-05-06 ENCOUNTER — Encounter: Payer: Self-pay | Admitting: *Deleted

## 2011-05-06 DIAGNOSIS — E782 Mixed hyperlipidemia: Secondary | ICD-10-CM

## 2011-05-06 MED ORDER — ATORVASTATIN CALCIUM 80 MG PO TABS: 80.0000 mg | ORAL_TABLET | Freq: Every day | ORAL | Status: DC

## 2011-06-02 ENCOUNTER — Encounter: Payer: Self-pay | Admitting: Family Medicine

## 2011-06-02 ENCOUNTER — Ambulatory Visit (INDEPENDENT_AMBULATORY_CARE_PROVIDER_SITE_OTHER): Payer: BC Managed Care – PPO | Admitting: Family Medicine

## 2011-06-02 VITALS — BP 142/76 | HR 76 | Resp 18 | Ht 68.75 in | Wt 216.0 lb

## 2011-06-02 DIAGNOSIS — I10 Essential (primary) hypertension: Secondary | ICD-10-CM

## 2011-06-02 DIAGNOSIS — E785 Hyperlipidemia, unspecified: Secondary | ICD-10-CM

## 2011-06-02 DIAGNOSIS — I4891 Unspecified atrial fibrillation: Secondary | ICD-10-CM

## 2011-06-02 DIAGNOSIS — Z7901 Long term (current) use of anticoagulants: Secondary | ICD-10-CM | POA: Insufficient documentation

## 2011-06-02 DIAGNOSIS — M25519 Pain in unspecified shoulder: Secondary | ICD-10-CM

## 2011-06-02 DIAGNOSIS — E119 Type 2 diabetes mellitus without complications: Secondary | ICD-10-CM

## 2011-06-02 MED ORDER — ATORVASTATIN CALCIUM 40 MG PO TABS: 40.0000 mg | ORAL_TABLET | Freq: Every day | ORAL | Status: DC

## 2011-06-02 MED ORDER — DICLOFENAC SODIUM 1 % TD GEL: 1.0000 "application " | Freq: Four times a day (QID) | TRANSDERMAL | Status: DC

## 2011-06-02 NOTE — Assessment & Plan Note (Signed)
Pt has not been taking statin.I will restart him at lipitor 40mg 

## 2011-06-02 NOTE — Progress Notes (Signed)
  Subjective:    Patient ID: Phillip Wilson, male    DOB: 1945-03-28, 66 y.o.   MRN: [id number]  HPI  Pt here to f/u chronic medical problems,he has not been seen since August 2012 DM- has not been taking Metformin as he does not have this medication, does not take CBG on regular basis Afib- admitted in December 2012, noted to have acute CHF and Afib both new diagnosis, follows with Dr. Lattie Haw, last note reviewed Hyperlipidemia- he does not have lipitor at home Right shoulder pain x 2 weeks, no injury, works as Retail buyer. No otc meds tried   Review of Systems  GEN- denies fatigue, fever, weight loss,weakness, recent illness HEENT- denies eye drainage, change in vision, nasal discharge, CVS- denies chest pain, palpitations RESP- denies SOB, cough, wheeze ABD- denies N/V, change in stools, abd pain GU- denies dysuria, hematuria, dribbling, incontinence MSK- + joint pain, muscle aches, injury Neuro- denies headache, dizziness, syncope, seizure activity       Objective:   Physical Exam GEN- NAD, alert and oriented x3 HEENT- PERRL, EOMI, non injected sclera, pink conjunctiva, MMM, oropharynx clear Neck- Supple, no thryomegaly CVS- RRR, no murmur RESP-CTAB ABD-NABS,soft, NT,ND EXT- No edema Shoulder- rotator cuff in tact, TTP along deltoid, neg impingment signs, strength equal bilat upper ext Pulses- Radial, DP- 2+ Foot exam done       Assessment & Plan:

## 2011-06-02 NOTE — Patient Instructions (Addendum)
Get your blood drawn for your diabetes number today I will send a your diabetes pill  Restart your cholesterol pill at bedtime - Lipitor  Continue your blood pressure medication Use the voltaren gel for your shoulder, call if this does not feel better F/U 3  Months

## 2011-06-02 NOTE — Assessment & Plan Note (Addendum)
He is on coumadin with no INR check since January in the system, will check PT/INR, I am not sure if he is suppose to be in coumadin clinic NSR today

## 2011-06-02 NOTE — Assessment & Plan Note (Signed)
With his cardiomyopathy and CHF that was recent, will hold on oral meds and try topical NSAID if pt can get this covered by insurance. If no improvement then will send to ortho for evaluation

## 2011-06-02 NOTE — Assessment & Plan Note (Signed)
Check A1C before restarting glucaphage

## 2011-06-02 NOTE — Assessment & Plan Note (Signed)
BP suboptimal today, no change to meds

## 2011-06-05 ENCOUNTER — Telehealth: Payer: Self-pay | Admitting: *Deleted

## 2011-06-05 MED ORDER — METFORMIN HCL ER 500 MG PO TB24: 500.0000 mg | ORAL_TABLET | Freq: Every day | ORAL | Status: DC

## 2011-06-05 NOTE — Progress Notes (Signed)
Addended by: Vic Blackbird F on: 06/05/2011 01:03 PM   Modules accepted: Orders

## 2011-06-05 NOTE — Telephone Encounter (Signed)
Received a phone call from Dr Buelah Manis stating that she saw Phillip Wilson on 5/6, where she drew an INR.  Result was 1.06 and patient has been on coumadin since discharge from hospital in 12/2010, for A fib.  Spoke with patient who states that he is taking 2.5 mg tablets 2 tabs each day.  Advised him to continue current regimen and appt made to see Edrick Oh in coumadin clinic next Wednesday to establish.  Verbalizes understanding.

## 2011-06-11 ENCOUNTER — Ambulatory Visit (INDEPENDENT_AMBULATORY_CARE_PROVIDER_SITE_OTHER): Payer: BC Managed Care – PPO | Admitting: *Deleted

## 2011-06-11 DIAGNOSIS — Z7901 Long term (current) use of anticoagulants: Secondary | ICD-10-CM

## 2011-06-18 ENCOUNTER — Ambulatory Visit (INDEPENDENT_AMBULATORY_CARE_PROVIDER_SITE_OTHER): Payer: BC Managed Care – PPO | Admitting: *Deleted

## 2011-06-18 ENCOUNTER — Other Ambulatory Visit: Payer: Self-pay

## 2011-06-18 DIAGNOSIS — Z7901 Long term (current) use of anticoagulants: Secondary | ICD-10-CM

## 2011-06-18 MED ORDER — FUROSEMIDE 80 MG PO TABS: 80.0000 mg | ORAL_TABLET | Freq: Every day | ORAL | Status: DC

## 2011-06-18 MED ORDER — POTASSIUM CHLORIDE CRYS ER 20 MEQ PO TBCR: 20.0000 meq | EXTENDED_RELEASE_TABLET | Freq: Two times a day (BID) | ORAL | Status: DC

## 2011-06-18 MED ORDER — WARFARIN SODIUM 5 MG PO TABS: 5.0000 mg | ORAL_TABLET | ORAL | Status: DC

## 2011-06-18 MED ORDER — METOPROLOL TARTRATE 50 MG PO TABS: ORAL_TABLET | ORAL | Status: DC

## 2011-06-18 MED ORDER — DIGOXIN 250 MCG PO TABS: 0.2500 mg | ORAL_TABLET | Freq: Every day | ORAL | Status: DC

## 2011-06-18 MED ORDER — DILTIAZEM HCL ER COATED BEADS 360 MG PO CP24: 360.0000 mg | ORAL_CAPSULE | Freq: Every day | ORAL | Status: DC

## 2011-06-30 ENCOUNTER — Encounter (INDEPENDENT_AMBULATORY_CARE_PROVIDER_SITE_OTHER): Payer: BC Managed Care – PPO

## 2011-06-30 DIAGNOSIS — Z7901 Long term (current) use of anticoagulants: Secondary | ICD-10-CM

## 2011-07-10 ENCOUNTER — Ambulatory Visit (INDEPENDENT_AMBULATORY_CARE_PROVIDER_SITE_OTHER): Payer: BC Managed Care – PPO | Admitting: *Deleted

## 2011-07-10 DIAGNOSIS — Z7901 Long term (current) use of anticoagulants: Secondary | ICD-10-CM

## 2011-07-10 LAB — POCT INR: INR: 1.5

## 2011-07-18 ENCOUNTER — Encounter: Payer: Self-pay | Admitting: *Deleted

## 2011-07-18 ENCOUNTER — Other Ambulatory Visit: Payer: Self-pay | Admitting: *Deleted

## 2011-07-18 DIAGNOSIS — E782 Mixed hyperlipidemia: Secondary | ICD-10-CM

## 2011-07-28 ENCOUNTER — Ambulatory Visit (INDEPENDENT_AMBULATORY_CARE_PROVIDER_SITE_OTHER): Payer: BC Managed Care – PPO | Admitting: *Deleted

## 2011-07-28 DIAGNOSIS — Z7901 Long term (current) use of anticoagulants: Secondary | ICD-10-CM

## 2011-07-28 LAB — POCT INR: INR: 1.1

## 2011-08-06 ENCOUNTER — Ambulatory Visit (INDEPENDENT_AMBULATORY_CARE_PROVIDER_SITE_OTHER): Payer: BC Managed Care – PPO | Admitting: *Deleted

## 2011-08-06 DIAGNOSIS — Z7901 Long term (current) use of anticoagulants: Secondary | ICD-10-CM

## 2011-08-14 ENCOUNTER — Ambulatory Visit (INDEPENDENT_AMBULATORY_CARE_PROVIDER_SITE_OTHER): Payer: BC Managed Care – PPO | Admitting: *Deleted

## 2011-08-14 DIAGNOSIS — Z7901 Long term (current) use of anticoagulants: Secondary | ICD-10-CM

## 2011-08-27 ENCOUNTER — Ambulatory Visit (INDEPENDENT_AMBULATORY_CARE_PROVIDER_SITE_OTHER): Payer: BC Managed Care – PPO | Admitting: *Deleted

## 2011-08-27 DIAGNOSIS — Z7901 Long term (current) use of anticoagulants: Secondary | ICD-10-CM

## 2011-09-02 ENCOUNTER — Ambulatory Visit: Payer: BC Managed Care – PPO | Admitting: Family Medicine

## 2011-09-03 ENCOUNTER — Ambulatory Visit (INDEPENDENT_AMBULATORY_CARE_PROVIDER_SITE_OTHER): Payer: BC Managed Care – PPO | Admitting: *Deleted

## 2011-09-03 DIAGNOSIS — Z7901 Long term (current) use of anticoagulants: Secondary | ICD-10-CM

## 2011-09-03 LAB — POCT INR: INR: 4.3

## 2011-09-11 ENCOUNTER — Ambulatory Visit (INDEPENDENT_AMBULATORY_CARE_PROVIDER_SITE_OTHER): Payer: BC Managed Care – PPO | Admitting: *Deleted

## 2011-09-11 DIAGNOSIS — Z7901 Long term (current) use of anticoagulants: Secondary | ICD-10-CM

## 2011-09-11 LAB — POCT INR: INR: 2.5

## 2011-09-12 ENCOUNTER — Encounter: Payer: Self-pay | Admitting: Family Medicine

## 2011-10-09 ENCOUNTER — Encounter: Payer: Self-pay | Admitting: Family Medicine

## 2011-10-09 ENCOUNTER — Ambulatory Visit (INDEPENDENT_AMBULATORY_CARE_PROVIDER_SITE_OTHER): Payer: BC Managed Care – PPO | Admitting: Family Medicine

## 2011-10-09 VITALS — BP 150/82 | HR 92 | Resp 18 | Ht 68.75 in | Wt 218.0 lb

## 2011-10-09 DIAGNOSIS — Z23 Encounter for immunization: Secondary | ICD-10-CM

## 2011-10-09 DIAGNOSIS — E119 Type 2 diabetes mellitus without complications: Secondary | ICD-10-CM

## 2011-10-09 DIAGNOSIS — I10 Essential (primary) hypertension: Secondary | ICD-10-CM

## 2011-10-09 DIAGNOSIS — I4891 Unspecified atrial fibrillation: Secondary | ICD-10-CM

## 2011-10-09 LAB — GLUCOSE, POCT (MANUAL RESULT ENTRY): POC Glucose: 109 mg/dl — AB (ref 70–99)

## 2011-10-09 MED ORDER — ATORVASTATIN CALCIUM 40 MG PO TABS: 40.0000 mg | ORAL_TABLET | Freq: Every day | ORAL | Status: DC

## 2011-10-09 MED ORDER — LISINOPRIL 5 MG PO TABS: 5.0000 mg | ORAL_TABLET | Freq: Two times a day (BID) | ORAL | Status: DC

## 2011-10-09 NOTE — Patient Instructions (Signed)
Get the labs done [facility] referral Get these medications from the pharmacy  Lipitor for your cholesterol  Lisinopril for your heart  Digoxin for your heart  Continue the metoprolol, take 1 tablet twice a day   Take potassium once a day with your water pill F/U 4 weeks for blood pressure

## 2011-10-12 ENCOUNTER — Encounter: Payer: Self-pay | Admitting: Family Medicine

## 2011-10-12 NOTE — Progress Notes (Signed)
  Subjective:    Patient ID: Phillip Wilson, male    DOB: 24-Jun-1945, 66 y.o.   MRN: [id number]  HPI Pt presents to f/u chronic medical problems. No specific concerns DM- he has not been checking his sugars HTN- he is missing many medications listed, history of CHF and AFIB- he is followed by coumadin clinic Medications reviewed Needs Flu shot  Review of Systems  GEN- denies fatigue, fever, weight loss,weakness, recent illness HEENT- denies eye drainage, change in vision, nasal discharge, CVS- denies chest pain, palpitations RESP- denies SOB, cough, wheeze ABD- denies N/V, change in stools, abd pain GU- denies dysuria, hematuria, dribbling, incontinence MSK- denies joint pain, muscle aches, injury Neuro- denies headache, dizziness, syncope, seizure activity      Objective:   Physical Exam GEN- NAD, alert and oriented x3 HEENT- PERRL, EOMI, non injected sclera, pink conjunctiva, MMM, oropharynx clear Neck- Supple,  CVS- RRR, no murmur RESP-CTAB EXT- No edema Pulses- Radial, DP- 2+        Assessment & Plan:

## 2011-10-12 NOTE — Assessment & Plan Note (Signed)
Uncontrolled, I called pharmacy to verify meds, there were many he had not picked up, including his digoxin for his A fib. I will try to simplify his meds to lisinopril, beta blocker, lasix

## 2011-10-12 NOTE — Assessment & Plan Note (Signed)
Sinus rhythm, INR within range, coumadin clinic

## 2011-10-12 NOTE — Assessment & Plan Note (Addendum)
Check A1C, encouraged him to check blood sugars fasting, he did not restart metformin after last visit

## 2011-10-28 LAB — CBC
HCT: 44.1 % (ref 39.0–52.0)
Hemoglobin: 14.7 g/dL (ref 13.0–17.0)
MCH: 27.7 pg (ref 26.0–34.0)
MCV: 83.2 fL (ref 78.0–100.0)
RBC: 5.3 MIL/uL (ref 4.22–5.81)

## 2011-10-28 LAB — BASIC METABOLIC PANEL
Chloride: 102 mEq/L (ref 96–112)
Potassium: 4 mEq/L (ref 3.5–5.3)

## 2011-11-06 ENCOUNTER — Ambulatory Visit: Payer: BC Managed Care – PPO | Admitting: Family Medicine

## 2011-11-19 ENCOUNTER — Encounter: Payer: Self-pay | Admitting: *Deleted

## 2012-01-08 ENCOUNTER — Ambulatory Visit (INDEPENDENT_AMBULATORY_CARE_PROVIDER_SITE_OTHER): Payer: BC Managed Care – PPO | Admitting: Family Medicine

## 2012-01-08 ENCOUNTER — Encounter: Payer: Self-pay | Admitting: Family Medicine

## 2012-01-08 VITALS — BP 130/78 | HR 74 | Resp 15 | Ht 68.75 in | Wt 216.0 lb

## 2012-01-08 DIAGNOSIS — I10 Essential (primary) hypertension: Secondary | ICD-10-CM

## 2012-01-08 DIAGNOSIS — E785 Hyperlipidemia, unspecified: Secondary | ICD-10-CM

## 2012-01-08 DIAGNOSIS — E119 Type 2 diabetes mellitus without complications: Secondary | ICD-10-CM

## 2012-01-08 DIAGNOSIS — E669 Obesity, unspecified: Secondary | ICD-10-CM

## 2012-01-08 DIAGNOSIS — I4891 Unspecified atrial fibrillation: Secondary | ICD-10-CM

## 2012-01-08 DIAGNOSIS — Z7901 Long term (current) use of anticoagulants: Secondary | ICD-10-CM

## 2012-01-08 NOTE — Assessment & Plan Note (Signed)
Unchanged discussed diet and activity

## 2012-01-08 NOTE — Assessment & Plan Note (Signed)
Check INR and get him re-established with coumadin clinic

## 2012-01-08 NOTE — Assessment & Plan Note (Signed)
Check A1c No current meds, on ACEI

## 2012-01-08 NOTE — Progress Notes (Signed)
  Subjective:    Patient ID: Phillip Wilson, male    DOB: 1945-06-12, 66 y.o.   MRN: [id number]  HPI  Pt here to follow chronic medical problems. He has no specific concerns. His mother passed away a couple months ago therefore he missed his followup appointment. He's not been seen by the Coumadin clinic however is taking Coumadin 5 mg daily. He denies any chest pain shortness of breath or leg swelling. He does state that occasionally he to hot flash at work but does not feel dizzy or have any pain associated does not break out in sweats. Only lasts a few seconds.   Review of Systems  GEN- denies fatigue, fever, weight loss,weakness, recent illness HEENT- denies eye drainage, change in vision, nasal discharge, CVS- denies chest pain, palpitations RESP- denies SOB, cough, wheeze ABD- denies N/V, change in stools, abd pain GU- denies dysuria, hematuria, dribbling, incontinence MSK- denies joint pain, muscle aches, injury Neuro- denies headache, dizziness, syncope, seizure activity      Objective:   Physical Exam GEN- NAD, alert and oriented x3 HEENT- PERRL, EOMI, non injected sclera, pink conjunctiva, MMM, oropharynx clear Neck- Supple, no thryomegaly CVS- RRR, no murmur RESP-CTAB EXT- No edema Pulses- Radial, DP- 2+        Assessment & Plan:

## 2012-01-08 NOTE — Patient Instructions (Addendum)
Get the blood drawn today Continue your current medications Get the shingles vaccine Referral to eye doctor F/U 3 months for physical

## 2012-01-08 NOTE — Assessment & Plan Note (Signed)
Well controlled 

## 2012-01-09 LAB — PROTIME-INR: Prothrombin Time: 16.8 seconds — ABNORMAL HIGH (ref 11.6–15.2)

## 2012-01-09 LAB — HEMOGLOBIN A1C
Hgb A1c MFr Bld: 6.6 % — ABNORMAL HIGH (ref <5.7)
Mean Plasma Glucose: 143 mg/dL — ABNORMAL HIGH (ref <117)

## 2012-02-04 ENCOUNTER — Ambulatory Visit (INDEPENDENT_AMBULATORY_CARE_PROVIDER_SITE_OTHER): Payer: BC Managed Care – PPO | Admitting: *Deleted

## 2012-02-04 DIAGNOSIS — Z7901 Long term (current) use of anticoagulants: Secondary | ICD-10-CM

## 2012-02-04 MED ORDER — WARFARIN SODIUM 5 MG PO TABS: ORAL_TABLET | ORAL | Status: DC

## 2012-02-12 ENCOUNTER — Ambulatory Visit (INDEPENDENT_AMBULATORY_CARE_PROVIDER_SITE_OTHER): Payer: BC Managed Care – PPO | Admitting: *Deleted

## 2012-02-12 DIAGNOSIS — Z7901 Long term (current) use of anticoagulants: Secondary | ICD-10-CM

## 2012-02-23 ENCOUNTER — Ambulatory Visit (INDEPENDENT_AMBULATORY_CARE_PROVIDER_SITE_OTHER): Payer: BC Managed Care – PPO | Admitting: *Deleted

## 2012-02-23 DIAGNOSIS — Z7901 Long term (current) use of anticoagulants: Secondary | ICD-10-CM

## 2012-03-18 ENCOUNTER — Ambulatory Visit (INDEPENDENT_AMBULATORY_CARE_PROVIDER_SITE_OTHER): Payer: BC Managed Care – PPO | Admitting: *Deleted

## 2012-03-18 DIAGNOSIS — Z7901 Long term (current) use of anticoagulants: Secondary | ICD-10-CM

## 2012-03-31 ENCOUNTER — Encounter: Payer: Self-pay | Admitting: Family Medicine

## 2012-04-07 ENCOUNTER — Other Ambulatory Visit: Payer: Self-pay

## 2012-04-07 MED ORDER — LISINOPRIL 5 MG PO TABS: 5.0000 mg | ORAL_TABLET | Freq: Two times a day (BID) | ORAL | Status: DC

## 2012-04-08 ENCOUNTER — Encounter: Payer: BC Managed Care – PPO | Admitting: Family Medicine

## 2012-04-14 ENCOUNTER — Ambulatory Visit (INDEPENDENT_AMBULATORY_CARE_PROVIDER_SITE_OTHER): Payer: BC Managed Care – PPO | Admitting: *Deleted

## 2012-04-14 DIAGNOSIS — Z7901 Long term (current) use of anticoagulants: Secondary | ICD-10-CM

## 2012-04-14 LAB — POCT INR: INR: 3.6

## 2012-04-15 ENCOUNTER — Encounter: Payer: BC Managed Care – PPO | Admitting: Family Medicine

## 2012-04-22 ENCOUNTER — Encounter: Payer: BC Managed Care – PPO | Admitting: Family Medicine

## 2012-05-03 ENCOUNTER — Encounter: Payer: Self-pay | Admitting: Family Medicine

## 2012-05-03 ENCOUNTER — Ambulatory Visit (INDEPENDENT_AMBULATORY_CARE_PROVIDER_SITE_OTHER): Payer: BC Managed Care – PPO | Admitting: Family Medicine

## 2012-05-03 VITALS — BP 142/86 | HR 67 | Resp 18 | Ht 68.75 in | Wt 212.0 lb

## 2012-05-03 DIAGNOSIS — E119 Type 2 diabetes mellitus without complications: Secondary | ICD-10-CM

## 2012-05-03 DIAGNOSIS — E669 Obesity, unspecified: Secondary | ICD-10-CM

## 2012-05-03 DIAGNOSIS — Z0000 Encounter for general adult medical examination without abnormal findings: Secondary | ICD-10-CM

## 2012-05-03 DIAGNOSIS — Z2911 Encounter for prophylactic immunotherapy for respiratory syncytial virus (RSV): Secondary | ICD-10-CM

## 2012-05-03 DIAGNOSIS — Z125 Encounter for screening for malignant neoplasm of prostate: Secondary | ICD-10-CM

## 2012-05-03 DIAGNOSIS — E785 Hyperlipidemia, unspecified: Secondary | ICD-10-CM

## 2012-05-03 DIAGNOSIS — I10 Essential (primary) hypertension: Secondary | ICD-10-CM

## 2012-05-03 MED ORDER — METOPROLOL SUCCINATE ER 25 MG PO TB24: 25.0000 mg | ORAL_TABLET | Freq: Every day | ORAL | Status: DC

## 2012-05-03 NOTE — Addendum Note (Signed)
Addended by: Denman George B on: 05/03/2012 03:48 PM   Modules accepted: Orders

## 2012-05-03 NOTE — Assessment & Plan Note (Signed)
Elevated blood pressure, called and verified with pharmacy, will change him to toprol once daily to help with compliance, recheck 4 weeks, he is to bring all meds

## 2012-05-03 NOTE — Assessment & Plan Note (Signed)
Complete fasting lipid panel will be obtained is currently on Lipitor 40 mg at bedtime

## 2012-05-03 NOTE — Addendum Note (Signed)
Addended by: Denman George B on: 05/03/2012 04:58 PM   Modules accepted: Orders

## 2012-05-03 NOTE — Assessment & Plan Note (Signed)
Continue to discuss proper diet and activity.

## 2012-05-03 NOTE — Patient Instructions (Addendum)
Come on Saturday morning to get blood work -we will discuss at the visit Shingles shot given Make sure you have total of 7 pills ( 2 for blood pressure- Lisinopril and Toprol) F/U 4 weeks

## 2012-05-03 NOTE — Assessment & Plan Note (Signed)
Doing well, shots UTD Colonoscopy UTD Fasting labs

## 2012-05-03 NOTE — Progress Notes (Signed)
  Subjective:    Patient ID: Phillip Wilson, male    DOB: 09-29-1945, 67 y.o.   MRN: [id number]  HPI  Pt here for CPE. No specific concerns, no meds with him so pharmacy called to verify meds. He has not had his Toprol this month. Due for repeat fasting labs, PSA last done in 2010 normal. Due for shingles vaccine Followed by coumadin clinic   Review of Systems  GEN- denies fatigue, fever, weight loss,weakness, recent illness HEENT- denies eye drainage, change in vision, nasal discharge, CVS- denies chest pain, palpitations RESP- denies SOB, cough, wheeze ABD- denies N/V, change in stools, abd pain GU- denies dysuria, hematuria, dribbling, incontinence MSK- denies joint pain, muscle aches, injury Neuro- denies headache, dizziness, syncope, seizure activity      Objective:   Physical Exam  GEN- NAD, alert and oriented x3 HEENT- PERRL, EOMI, non injected sclera, pink conjunctiva, MMM, oropharynx clear Neck- Supple, no LAD, no bruit CVS- RRR, no murmur RESP-CTAB ABD-NABS,soft,NT,ND Rectum- normal tone, FOBT neg, prostate smooth no nodules, may be a little enlarged at upper borders EXT- No edema Pulses- Radial, DP- 2+        Assessment & Plan:

## 2012-05-05 LAB — MICROALBUMIN / CREATININE URINE RATIO
Creatinine, Urine: 218.9 mg/dL
Microalb Creat Ratio: 4.4 mg/g (ref 0.0–30.0)

## 2012-05-26 ENCOUNTER — Other Ambulatory Visit: Payer: Self-pay | Admitting: Cardiology

## 2012-05-31 ENCOUNTER — Ambulatory Visit: Payer: BC Managed Care – PPO | Admitting: Family Medicine

## 2012-06-12 ENCOUNTER — Other Ambulatory Visit: Payer: Self-pay | Admitting: Family Medicine

## 2012-06-12 LAB — CBC
HCT: 43.9 % (ref 39.0–52.0)
MCHC: 33.5 g/dL (ref 30.0–36.0)
MCV: 81.1 fL (ref 78.0–100.0)
RDW: 15.5 % (ref 11.5–15.5)
WBC: 7.5 10*3/uL (ref 4.0–10.5)

## 2012-06-12 LAB — COMPREHENSIVE METABOLIC PANEL
AST: 28 U/L (ref 0–37)
BUN: 11 mg/dL (ref 6–23)
Calcium: 9 mg/dL (ref 8.4–10.5)
Chloride: 105 mEq/L (ref 96–112)
Creat: 1.03 mg/dL (ref 0.50–1.35)
Glucose, Bld: 108 mg/dL — ABNORMAL HIGH (ref 70–99)

## 2012-06-12 LAB — LIPID PANEL
Cholesterol: 137 mg/dL (ref 0–200)
HDL: 56 mg/dL (ref >39)
Total CHOL/HDL Ratio: 2.4 Ratio

## 2012-06-14 LAB — HEMOGLOBIN A1C: Hgb A1c MFr Bld: 5.9 % — ABNORMAL HIGH (ref <5.7)

## 2012-06-15 ENCOUNTER — Ambulatory Visit (INDEPENDENT_AMBULATORY_CARE_PROVIDER_SITE_OTHER): Payer: BC Managed Care – PPO | Admitting: Family Medicine

## 2012-06-15 ENCOUNTER — Encounter: Payer: Self-pay | Admitting: Family Medicine

## 2012-06-15 VITALS — BP 138/78 | HR 66 | Resp 18 | Ht 68.75 in | Wt 211.1 lb

## 2012-06-15 DIAGNOSIS — I10 Essential (primary) hypertension: Secondary | ICD-10-CM

## 2012-06-15 DIAGNOSIS — Z7901 Long term (current) use of anticoagulants: Secondary | ICD-10-CM

## 2012-06-15 DIAGNOSIS — E119 Type 2 diabetes mellitus without complications: Secondary | ICD-10-CM

## 2012-06-15 NOTE — Progress Notes (Signed)
  Subjective:    Patient ID: Phillip Wilson, male    DOB: October 06, 1945, 67 y.o.   MRN: [id number]  HPI  Patient here to followup low pressure and interim as well as fasting labs. He has no specific concerns. I did note he has not had a Coumadin appointment in the past 6 weeks. He did not bring his medications with him today the only thing he has not taken is the water pill which he states he takes at night   Review of Systems - per above  GEN- denies fatigue, fever, weight loss,weakness, recent illness HEENT- denies eye drainage, change in vision, nasal discharge, CVS- denies chest pain, palpitations RESP- denies SOB, cough, wheeze Neuro- denies headache, dizziness, syncope, seizure activity       Objective:   Physical Exam GEN- NAD, alert and oriented x3 CVS- RRR, no murmur RESP-CTAB EXT- No edema Pulses- Radial, DP- 2+        Assessment & Plan:

## 2012-06-15 NOTE — Assessment & Plan Note (Signed)
BP much improved, no change to meds

## 2012-06-15 NOTE — Assessment & Plan Note (Signed)
He has missed last coumadin appt, I did not note this when I reviewed his labs, appt made for tomorrow evening with coumadin clinic

## 2012-06-15 NOTE — Assessment & Plan Note (Signed)
Diet controlled, A1C at goal

## 2012-06-15 NOTE — Patient Instructions (Signed)
Your labs look good,  Continue your medications Coumadin appointment  F/U 3 months [employer]

## 2012-06-24 ENCOUNTER — Ambulatory Visit (INDEPENDENT_AMBULATORY_CARE_PROVIDER_SITE_OTHER): Payer: BC Managed Care – PPO | Admitting: *Deleted

## 2012-06-24 DIAGNOSIS — Z7901 Long term (current) use of anticoagulants: Secondary | ICD-10-CM

## 2012-06-24 LAB — POCT INR: INR: 3.6

## 2012-06-30 ENCOUNTER — Other Ambulatory Visit: Payer: Self-pay | Admitting: Family Medicine

## 2012-06-30 ENCOUNTER — Other Ambulatory Visit: Payer: Self-pay | Admitting: Cardiology

## 2012-07-02 MED ORDER — DIGOXIN 250 MCG PO TABS: 0.2500 mg | ORAL_TABLET | Freq: Every day | ORAL | Status: DC

## 2012-07-02 NOTE — Telephone Encounter (Signed)
rx sent to pharmacy by e-script For #30 no refills per pt needs apt, placed comment on medication for pt to call office to schedule apt asap

## 2012-07-29 ENCOUNTER — Telehealth: Payer: Self-pay | Admitting: Family Medicine

## 2012-07-29 NOTE — Telephone Encounter (Signed)
Pt has been discontinued on this med

## 2012-08-10 ENCOUNTER — Other Ambulatory Visit: Payer: Self-pay | Admitting: Cardiology

## 2012-08-11 ENCOUNTER — Other Ambulatory Visit: Payer: Self-pay | Admitting: *Deleted

## 2012-08-13 ENCOUNTER — Telehealth: Payer: Self-pay | Admitting: Family Medicine

## 2012-08-13 ENCOUNTER — Other Ambulatory Visit: Payer: Self-pay | Admitting: *Deleted

## 2012-08-13 MED ORDER — DIGOXIN 250 MCG PO TABS: 0.2500 mg | ORAL_TABLET | Freq: Every day | ORAL | Status: DC

## 2012-08-13 MED ORDER — METOPROLOL SUCCINATE ER 25 MG PO TB24: 25.0000 mg | ORAL_TABLET | Freq: Every day | ORAL | Status: DC

## 2012-08-13 MED ORDER — POTASSIUM CHLORIDE CRYS ER 20 MEQ PO TBCR: 20.0000 meq | EXTENDED_RELEASE_TABLET | Freq: Two times a day (BID) | ORAL | Status: DC

## 2012-08-13 MED ORDER — LISINOPRIL 5 MG PO TABS: 5.0000 mg | ORAL_TABLET | Freq: Two times a day (BID) | ORAL | Status: DC

## 2012-08-13 MED ORDER — FUROSEMIDE 80 MG PO TABS: ORAL_TABLET | ORAL | Status: DC

## 2012-08-13 NOTE — Telephone Encounter (Signed)
Meds refilled.

## 2012-09-03 ENCOUNTER — Telehealth: Payer: Self-pay | Admitting: Family Medicine

## 2012-09-03 ENCOUNTER — Other Ambulatory Visit: Payer: Self-pay | Admitting: *Deleted

## 2012-09-03 MED ORDER — DIGOXIN 250 MCG PO TABS: 0.2500 mg | ORAL_TABLET | Freq: Every day | ORAL | Status: DC

## 2012-09-03 NOTE — Telephone Encounter (Signed)
Refilled this one time per Dr. Buelah Manis, pt needs to make follow up appt wit his cardiologist

## 2012-09-20 ENCOUNTER — Telehealth: Payer: Self-pay | Admitting: *Deleted

## 2012-09-20 ENCOUNTER — Other Ambulatory Visit: Payer: Self-pay | Admitting: *Deleted

## 2012-09-20 NOTE — Telephone Encounter (Signed)
Unable to reach pt by phone to set up an appt for him to be seen in coumadin clinic to check INR

## 2012-09-22 ENCOUNTER — Ambulatory Visit: Payer: Self-pay | Admitting: Family Medicine

## 2012-09-22 ENCOUNTER — Telehealth: Payer: Self-pay | Admitting: Family Medicine

## 2012-09-23 MED ORDER — WARFARIN SODIUM 5 MG PO TABS: ORAL_TABLET | ORAL | Status: DC

## 2012-09-23 MED ORDER — POTASSIUM CHLORIDE CRYS ER 20 MEQ PO TBCR: 20.0000 meq | EXTENDED_RELEASE_TABLET | Freq: Two times a day (BID) | ORAL | Status: DC

## 2012-09-23 NOTE — Telephone Encounter (Signed)
Meds refilled.

## 2012-09-23 NOTE — Telephone Encounter (Signed)
Ok to refill 

## 2012-09-23 NOTE — Telephone Encounter (Signed)
30 day supply only?

## 2012-09-28 ENCOUNTER — Ambulatory Visit: Payer: BC Managed Care – PPO | Admitting: Adult Health

## 2012-09-28 ENCOUNTER — Encounter: Payer: Self-pay | Admitting: Adult Health

## 2012-09-29 ENCOUNTER — Encounter: Payer: Self-pay | Admitting: Family Medicine

## 2012-09-29 ENCOUNTER — Ambulatory Visit (INDEPENDENT_AMBULATORY_CARE_PROVIDER_SITE_OTHER): Payer: BC Managed Care – PPO | Admitting: Family Medicine

## 2012-09-29 VITALS — BP 118/62 | HR 80 | Temp 97.5°F | Resp 20 | Wt 201.0 lb

## 2012-09-29 DIAGNOSIS — I10 Essential (primary) hypertension: Secondary | ICD-10-CM

## 2012-09-29 DIAGNOSIS — Z7901 Long term (current) use of anticoagulants: Secondary | ICD-10-CM

## 2012-09-29 DIAGNOSIS — I4891 Unspecified atrial fibrillation: Secondary | ICD-10-CM

## 2012-09-29 MED ORDER — DIGOXIN 250 MCG PO TABS: 0.2500 mg | ORAL_TABLET | Freq: Every day | ORAL | Status: DC

## 2012-09-29 MED ORDER — METOPROLOL SUCCINATE ER 25 MG PO TB24: 25.0000 mg | ORAL_TABLET | Freq: Every day | ORAL | Status: DC

## 2012-09-29 MED ORDER — POTASSIUM CHLORIDE CRYS ER 20 MEQ PO TBCR: 20.0000 meq | EXTENDED_RELEASE_TABLET | Freq: Every day | ORAL | Status: DC

## 2012-09-29 MED ORDER — LISINOPRIL 5 MG PO TABS: 5.0000 mg | ORAL_TABLET | Freq: Two times a day (BID) | ORAL | Status: DC

## 2012-09-29 MED ORDER — WARFARIN SODIUM 5 MG PO TABS: 5.0000 mg | ORAL_TABLET | Freq: Every day | ORAL | Status: DC

## 2012-09-29 MED ORDER — FUROSEMIDE 80 MG PO TABS: ORAL_TABLET | ORAL | Status: DC

## 2012-09-29 MED ORDER — ATORVASTATIN CALCIUM 40 MG PO TABS: 40.0000 mg | ORAL_TABLET | Freq: Every day | ORAL | Status: DC

## 2012-09-29 NOTE — Progress Notes (Signed)
  Subjective:    Patient ID: Phillip Wilson, male    DOB: 13-Sep-1945, 67 y.o.   MRN: [id number]  HPI  Pt here to f/u and for medication refills Out of all of his meds Coumadin Check- has not had INR, has missed several appt to coumadin clinic. Ran out of coumadin 1 week ago. Currently on for Afib, denies any chest pain or SOB. No concerns today  Review of Systems   GEN- denies fatigue, fever, weight loss,weakness, recent illness HEENT- denies eye drainage, change in vision, nasal discharge, CVS- denies chest pain, palpitations RESP- denies SOB, cough, wheeze ABD- denies N/V, change in stools, abd pain GU- denies dysuria, hematuria, dribbling, incontinence MSK- denies joint pain, muscle aches, injury Neuro- denies headache, dizziness, syncope, seizure activity      Objective:   Physical Exam  GEN- NAD, alert and oriented x3 HEENT- PERRL, EOMI, non injected sclera, pink conjunctiva, MMM, oropharynx clear Neck- Supple, no thryomegaly CVS- irregular irregular rhythm, normal rate, no murmur RESP-CTAB EXT- No edema Pulses- Radial, DP- 2+       Assessment & Plan:

## 2012-09-29 NOTE — Assessment & Plan Note (Signed)
Continue current meds 

## 2012-09-29 NOTE — Patient Instructions (Addendum)
Coumadin Check will be Monday Sept 8th at 3:30 in [place] Take 1 coumadin tablet by mouth daily Restart your other medications F/U 2 months

## 2012-09-29 NOTE — Assessment & Plan Note (Signed)
Coumadin 5mg  daily for the next week, f/u Monday for recheck

## 2012-09-29 NOTE — Assessment & Plan Note (Addendum)
Doing well, no CP, no SOB Coumadin subtherapeutic Coumadin appt scheduled for Monday I wonder if he would benefit from pradaxa or xarelto, since we have difficult with him keeping coumadin appt

## 2012-10-04 ENCOUNTER — Ambulatory Visit (INDEPENDENT_AMBULATORY_CARE_PROVIDER_SITE_OTHER): Payer: BC Managed Care – PPO | Admitting: *Deleted

## 2012-10-04 DIAGNOSIS — Z7901 Long term (current) use of anticoagulants: Secondary | ICD-10-CM

## 2012-10-04 LAB — POCT INR: INR: 1.6

## 2012-10-18 ENCOUNTER — Ambulatory Visit (INDEPENDENT_AMBULATORY_CARE_PROVIDER_SITE_OTHER): Payer: BC Managed Care – PPO | Admitting: *Deleted

## 2012-10-18 DIAGNOSIS — Z7901 Long term (current) use of anticoagulants: Secondary | ICD-10-CM

## 2012-10-18 LAB — POCT INR: INR: 3.4

## 2012-10-25 ENCOUNTER — Encounter: Payer: Self-pay | Admitting: Adult Health

## 2012-10-25 ENCOUNTER — Ambulatory Visit (INDEPENDENT_AMBULATORY_CARE_PROVIDER_SITE_OTHER): Payer: BC Managed Care – PPO | Admitting: Adult Health

## 2012-10-25 VITALS — BP 138/80 | HR 65 | Ht 68.0 in | Wt 201.0 lb

## 2012-10-25 DIAGNOSIS — I428 Other cardiomyopathies: Secondary | ICD-10-CM

## 2012-10-25 DIAGNOSIS — I10 Essential (primary) hypertension: Secondary | ICD-10-CM

## 2012-10-25 DIAGNOSIS — I429 Cardiomyopathy, unspecified: Secondary | ICD-10-CM
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
## 2012-10-25 NOTE — Progress Notes (Deleted)
Name: Phillip Wilson    DOB: 1945-11-13  Age: 67 y.o.  MR#: [id number]       PCP:  Vic Blackbird, MD      Insurance: Payor: BLUE [place] / Plan: [employer] [place] PPO / Product Type: *No Product type* /   CC:    Chief Complaint  Patient presents with  . Atrial Fibrillation  . Hypertension    VS Filed Vitals:   10/25/12 1458  BP: 138/80  Pulse: 65  Height: 5\' 8"  (1.727 m)  Weight: 201 lb (91.173 kg)    Weights Current Weight  10/25/12 201 lb (91.173 kg)  09/29/12 201 lb (91.173 kg)  06/15/12 211 lb 1.3 oz (95.745 kg)    Blood Pressure  BP Readings from Last 3 Encounters:  10/25/12 138/80  09/29/12 118/62  06/15/12 138/78     Admit date:  (Not on file) Last encounter with RMR:  09/28/2012   Allergy Review of patient's allergies indicates no known allergies.  Current Outpatient Prescriptions  Medication Sig Dispense Refill  . atorvastatin (LIPITOR) 40 MG tablet Take 1 tablet (40 mg total) by mouth daily.  30 tablet  6  . digoxin (LANOXIN) 0.25 MG tablet Take 1 tablet (0.25 mg total) by mouth daily.  30 tablet  6  . furosemide (LASIX) 80 MG tablet TAKE ONE TABLET BY MOUTH EVERY DAY FOR  CONGESTIVE  HEART  FAILURE  30 tablet  6  . lisinopril (PRINIVIL,ZESTRIL) 5 MG tablet Take 1 tablet (5 mg total) by mouth 2 (two) times daily. FOR YOUR HEART AND BLOOD PRESSURE  60 tablet  3  . metoprolol succinate (TOPROL XL) 25 MG 24 hr tablet Take 1 tablet (25 mg total) by mouth daily. For blood presure  30 tablet  6  . potassium chloride SA (K-DUR,KLOR-CON) 20 MEQ tablet Take 1 tablet (20 mEq total) by mouth daily.  30 tablet  6  . warfarin (COUMADIN) 5 MG tablet Take 1 tablet (5 mg total) by mouth daily. TAKE ONE  TABLET BY MOUTH EVERY DAY  30 tablet  3   No current facility-administered medications for this visit.    Discontinued Meds:   There are no discontinued medications.  Patient Active Problem List   Diagnosis Date Noted  . Routine general medical examination at a health  care facility 05/03/2012  . Long term current use of anticoagulant 06/02/2011  . Shoulder pain 06/02/2011  . Gastroesophageal reflux disease   . Diabetes mellitus, type 2   . Thrombocytopenia 01/24/2011  . Cardiomyopathy 12/28/2010  . Atrial fibrillation 12/28/2010  . Vitamin D deficiency 09/03/2010  . OBESITY 02/14/2008  . HYPERLIPIDEMIA 05/05/2006  . HYPERTENSION 04/02/2006    LABS    Component Value Date/Time   NA 142 05/03/2012 1511   NA 142 10/28/2011 0930   NA 140 03/29/2011 0902   K 3.7 05/03/2012 1511   K 4.0 10/28/2011 0930   K 5.0 03/29/2011 0902   CL 105 05/03/2012 1511   CL 102 10/28/2011 0930   CL 102 03/29/2011 0902   CO2 30 05/03/2012 1511   CO2 27 10/28/2011 0930   CO2 30 03/29/2011 0902   GLUCOSE 108* 05/03/2012 1511   GLUCOSE 100* 10/28/2011 0930   GLUCOSE 113* 03/29/2011 0902   BUN 11 05/03/2012 1511   BUN 13 10/28/2011 0930   BUN 19 03/29/2011 0902   CREATININE 1.03 05/03/2012 1511   CREATININE 1.18 10/28/2011 0930   CREATININE 1.17 03/29/2011 0902   CREATININE 1.37*  01/26/2011 0457   CREATININE 1.29 01/25/2011 0455   CREATININE 1.22 01/24/2011 0432   CALCIUM 9.0 05/03/2012 1511   CALCIUM 9.1 10/28/2011 0930   CALCIUM 9.6 03/29/2011 0902   GFRNONAA 53* 01/26/2011 0457   GFRNONAA 57* 01/25/2011 0455   GFRNONAA 61* 01/24/2011 0432   GFRAA 62* 01/26/2011 0457   GFRAA 67* 01/25/2011 0455   GFRAA 71* 01/24/2011 0432   CMP     Component Value Date/Time   NA 142 05/03/2012 1511   K 3.7 05/03/2012 1511   CL 105 05/03/2012 1511   CO2 30 05/03/2012 1511   GLUCOSE 108* 05/03/2012 1511   BUN 11 05/03/2012 1511   CREATININE 1.03 05/03/2012 1511   CREATININE 1.37* 01/26/2011 0457   CALCIUM 9.0 05/03/2012 1511   PROT 6.8 05/03/2012 1511   ALBUMIN 4.0 05/03/2012 1511   AST 28 05/03/2012 1511   ALT 17 05/03/2012 1511   ALKPHOS 99 05/03/2012 1511   BILITOT 1.1 05/03/2012 1511   GFRNONAA 53* 01/26/2011 0457   GFRAA 62* 01/26/2011 0457       Component Value Date/Time   WBC 7.5 05/03/2012 1511   WBC 6.7  10/28/2011 0930   WBC 8.5 01/25/2011 0455   HGB 14.7 05/03/2012 1511   HGB 14.7 10/28/2011 0930   HGB 15.3 01/25/2011 0455   HCT 43.9 05/03/2012 1511   HCT 44.1 10/28/2011 0930   HCT 46.6 01/25/2011 0455   MCV 81.1 05/03/2012 1511   MCV 83.2 10/28/2011 0930   MCV 87.4 01/25/2011 0455    Lipid Panel     Component Value Date/Time   CHOL 137 05/03/2012 1511   TRIG 110 05/03/2012 1511   HDL 56 05/03/2012 1511   CHOLHDL 2.4 05/03/2012 1511   VLDL 22 05/03/2012 1511   LDLCALC 59 05/03/2012 1511    ABG No results found for this basename: phart, pco2, pco2art, po2, po2art, hco3, tco2, acidbasedef, o2sat     Lab Results  Component Value Date   TSH 1.425 01/22/2011   BNP (last 3 results) No results found for this basename: PROBNP,  in the last 8760 hours Cardiac Panel (last 3 results) No results found for this basename: CKTOTAL, CKMB, TROPONINI, RELINDX,  in the last 72 hours  Iron/TIBC/Ferritin No results found for this basename: iron, tibc, ferritin     EKG Orders placed in visit on 10/25/12  . EKG 12-LEAD     Prior Assessment and Plan Problem List as of 10/25/2012   HYPERLIPIDEMIA   Last Assessment & Plan   05/03/2012 Office Visit Written 05/03/2012  3:45 PM by Alycia Rossetti, MD     Complete fasting lipid panel will be obtained is currently on Lipitor 40 mg at bedtime    OBESITY   Last Assessment & Plan   05/03/2012 Office Visit Written 05/03/2012  3:42 PM by Alycia Rossetti, MD     Continue to discuss proper diet and activity.    HYPERTENSION   Last Assessment & Plan   09/29/2012 Office Visit Written 09/29/2012  9:38 PM by Alycia Rossetti, MD     Continue current meds    Vitamin D deficiency   Last Assessment & Plan   09/17/2010 Office Visit Written 09/17/2010  5:14 PM by Alycia Rossetti, MD     His vitamin D levels are normal. He will pick up of vitamin with calcium and vitamin D    Thrombocytopenia   Gastroesophageal reflux disease   Diabetes mellitus, type 2   Last  Assessment &  Plan   06/15/2012 Office Visit Written 06/15/2012 10:06 PM by Alycia Rossetti, MD     Diet controlled, A1C at goal    Cardiomyopathy   Last Assessment & Plan   03/26/2011 Office Visit Written 03/26/2011  2:46 PM by Yehuda Savannah, MD     Etiology of cardiomyopathy is uncertain.  Patient reports a negative stress test approximately one year before he presented with congestive heart failure.  2 possibilities are a tachycardia-related process secondary to his atrial fibrillation or alcohol-induced.  He was consuming 4 beers per day prior to his hospital admission, but has decreased this by 90%.  Echocardiography will be repeated to reassess left ventricular function now that his arrhythmia and excessive alcohol use had been discontinued.    Atrial fibrillation   Last Assessment & Plan   09/29/2012 Office Visit Edited 09/29/2012  9:43 PM by Alycia Rossetti, MD     Doing well, no CP, no SOB Coumadin subtherapeutic Coumadin appt scheduled for Monday I wonder if he would benefit from pradaxa or xarelto, since we have difficult with him keeping coumadin appt    Long term current use of anticoagulant   Last Assessment & Plan   09/29/2012 Office Visit Written 09/29/2012  9:38 PM by Alycia Rossetti, MD     Coumadin 5mg  daily for the next week, f/u Monday for recheck    Shoulder pain   Last Assessment & Plan   06/02/2011 Office Visit Written 06/02/2011  8:39 PM by Alycia Rossetti, MD     With his cardiomyopathy and CHF that was recent, will hold on oral meds and try topical NSAID if pt can get this covered by insurance. If no improvement then will send to ortho for evaluation    Routine general medical examination at a health care facility   Last Assessment & Plan   05/03/2012 Office Visit Written 05/03/2012  3:40 PM by Alycia Rossetti, MD     Doing well, shots UTD Colonoscopy UTD Fasting labs        Imaging: No results found.

## 2012-10-25 NOTE — Assessment & Plan Note (Signed)
Blood pressure is well controlled currently. No changes in his medications at present. He will continue lisinopril 5 mg daily along with metoprolol.

## 2012-10-25 NOTE — Patient Instructions (Addendum)
Your physician recommends that you schedule a follow-up appointment in: 6 months with Dr Koneswaran You will receive a reminder letter two months in advance reminding you to call and schedule your appointment. If you don't receive this letter, please contact our office.  Your physician recommends that you continue on your current medications as directed. Please refer to the Current Medication list given to you today.   

## 2012-11-08 ENCOUNTER — Encounter: Payer: Self-pay | Admitting: *Deleted

## 2012-11-29 ENCOUNTER — Ambulatory Visit: Payer: BC Managed Care – PPO | Admitting: Family Medicine

## 2012-12-11 ENCOUNTER — Other Ambulatory Visit: Payer: Self-pay | Admitting: Family Medicine

## 2013-04-11 ENCOUNTER — Encounter: Payer: Self-pay | Admitting: Family Medicine

## 2013-04-11 ENCOUNTER — Ambulatory Visit (INDEPENDENT_AMBULATORY_CARE_PROVIDER_SITE_OTHER): Payer: BC Managed Care – PPO | Admitting: Family Medicine

## 2013-04-11 VITALS — BP 148/72 | HR 64 | Temp 98.3°F | Resp 20 | Ht 68.0 in | Wt 210.0 lb

## 2013-04-11 DIAGNOSIS — Z23 Encounter for immunization: Secondary | ICD-10-CM | POA: Diagnosis not present

## 2013-04-11 DIAGNOSIS — E785 Hyperlipidemia, unspecified: Secondary | ICD-10-CM | POA: Diagnosis not present

## 2013-04-11 DIAGNOSIS — I4891 Unspecified atrial fibrillation: Secondary | ICD-10-CM | POA: Diagnosis not present

## 2013-04-11 DIAGNOSIS — I10 Essential (primary) hypertension: Secondary | ICD-10-CM

## 2013-04-11 DIAGNOSIS — E119 Type 2 diabetes mellitus without complications: Secondary | ICD-10-CM

## 2013-04-11 MED ORDER — DABIGATRAN ETEXILATE MESYLATE 75 MG PO CAPS: 75.0000 mg | ORAL_CAPSULE | Freq: Two times a day (BID) | ORAL | Status: DC

## 2013-04-11 NOTE — Progress Notes (Signed)
Patient ID: Phillip Wilson, male   DOB: September 13, 1945, 68 y.o.   MRN: [id number]   Subjective:    Patient ID: Phillip Wilson, male    DOB: [birth year], 68 y.o.   MRN: [id number]  Patient presents for F/U and Medication review  She here to follow chronic medical problems. He has no specific concerns. He is out of a few of his medications. He is also been out of his Coumadin for the past 2 months and has not followed up with cardiology for INR check since last September. He does have an appointment this Friday with his cardiologist for his history of cardiomyopathy as well as atrial fibrillation. He has not had any chest pain or shortness of breath.  DM- diet controlled,on ACE, statin though he does not have the Lipitor with him today still not sure if he is taking this.  Review Of Systems:  GEN- denies fatigue, fever, weight loss,weakness, recent illness HEENT- denies eye drainage, change in vision, nasal discharge, CVS- denies chest pain, palpitations RESP- denies SOB, cough, wheeze ABD- denies N/V, change in stools, abd pain GU- denies dysuria, hematuria, dribbling, incontinence MSK- denies joint pain, muscle aches, injury Neuro- denies headache, dizziness, syncope, seizure activity       Objective:    BP 148/72  Pulse 64  Temp(Src) 98.3 F (36.8 C)  Resp 20  Ht 5\' 8"  (1.727 m)  Wt 210 lb (95.255 kg)  BMI 31.94 kg/m2 GEN- NAD, alert and oriented x3 HEENT- PERRL, EOMI, non injected sclera, pink conjunctiva, MMM, oropharynx clear Neck- Supple, no thryomegaly CVS- irregular irregular rhythm, normal rate, no murmur RESP-CTAB EXT- No edema Pulses- Radial, DP- 2+        Assessment & Plan:      Problem List Items Addressed This Visit   HYPERTENSION     Bp elevated some today, medications refilled    Relevant Medications      dabigatran (PRADAXA) capsule   HYPERLIPIDEMIA     Check non fasting labs, he does not have lipitor with him, I am not sure what he is taking    Diabetes mellitus, type 2     Diet controlled, on ACE, statin    Relevant Orders      CBC with Differential      Comprehensive metabolic panel      Hemoglobin A1c      Lipid panel      Microalbumin / creatinine urine ratio   Atrial fibrillation     He is unfortunately noncompliant with his Coumadin and often stops taking the medication also he does not follow up on a regular basis for INR monitoring. I think that this is too high risk to continue the medication I started him on Pradaxa 75 mg twice a day, we have samples in the office He will f/u with cardiology on Friday. I will see if they have any other medications they would prefer instead of the Pradaxa but I would not restart him on Coumadin at this time     Other Visit Diagnoses   Need for prophylactic vaccination against Streptococcus pneumoniae (pneumococcus)    -  Primary    Relevant Orders       Pneumococcal conjugate vaccine 13-valent (Completed)       Note: This dictation was prepared with Dragon dictation along with smaller phrase technology. Any transcriptional errors that result from this process are unintentional.

## 2013-04-11 NOTE — Patient Instructions (Signed)
We will call with  Lab results Pneumonias shot given NO MORE COUMADIN Take the pradaxa 1 tablet in morning and 1 in evening F/U 3 months

## 2013-04-11 NOTE — Assessment & Plan Note (Signed)
He is unfortunately noncompliant with his Coumadin and often stops taking the medication also he does not follow up on a regular basis for INR monitoring. I think that this is too high risk to continue the medication I started him on Pradaxa 75 mg twice a day, we have samples in the office He will f/u with cardiology on Friday. I will see if they have any other medications they would prefer instead of the Pradaxa but I would not restart him on Coumadin at this time

## 2013-04-11 NOTE — Assessment & Plan Note (Signed)
Bp elevated some today, medications refilled

## 2013-04-11 NOTE — Assessment & Plan Note (Signed)
Check non fasting labs, he does not have lipitor with him, I am not sure what he is taking

## 2013-04-11 NOTE — Assessment & Plan Note (Signed)
Diet controlled, on ACE, statin

## 2013-04-12 LAB — CBC WITH DIFFERENTIAL/PLATELET
Basophils Absolute: 0 10*3/uL (ref 0.0–0.1)
Basophils Relative: 0 % (ref 0–1)
EOS ABS: 0.1 10*3/uL (ref 0.0–0.7)
EOS PCT: 1 % (ref 0–5)
HCT: 45.2 % (ref 39.0–52.0)
HEMOGLOBIN: 14.6 g/dL (ref 13.0–17.0)
LYMPHS ABS: 1.7 10*3/uL (ref 0.7–4.0)
LYMPHS PCT: 24 % (ref 12–46)
MCH: 27.3 pg (ref 26.0–34.0)
MCHC: 32.3 g/dL (ref 30.0–36.0)
MCV: 84.6 fL (ref 78.0–100.0)
MONOS PCT: 7 % (ref 3–12)
Monocytes Absolute: 0.5 10*3/uL (ref 0.1–1.0)
Neutro Abs: 4.8 10*3/uL (ref 1.7–7.7)
Neutrophils Relative %: 68 % (ref 43–77)
Platelets: 148 10*3/uL — ABNORMAL LOW (ref 150–400)
RBC: 5.34 MIL/uL (ref 4.22–5.81)
RDW: 15.7 % — ABNORMAL HIGH (ref 11.5–15.5)
WBC: 7 10*3/uL (ref 4.0–10.5)

## 2013-04-12 LAB — COMPREHENSIVE METABOLIC PANEL
ALT: 12 U/L (ref 0–53)
AST: 25 U/L (ref 0–37)
Albumin: 3.8 g/dL (ref 3.5–5.2)
Alkaline Phosphatase: 88 U/L (ref 39–117)
BUN: 11 mg/dL (ref 6–23)
CHLORIDE: 102 meq/L (ref 96–112)
CO2: 32 mEq/L (ref 19–32)
CREATININE: 1.15 mg/dL (ref 0.50–1.35)
Calcium: 8.7 mg/dL (ref 8.4–10.5)
Glucose, Bld: 110 mg/dL — ABNORMAL HIGH (ref 70–99)
Potassium: 3.8 mEq/L (ref 3.5–5.3)
Sodium: 140 mEq/L (ref 135–145)
TOTAL PROTEIN: 6.4 g/dL (ref 6.0–8.3)
Total Bilirubin: 0.7 mg/dL (ref 0.2–1.2)

## 2013-04-12 LAB — HEMOGLOBIN A1C
HEMOGLOBIN A1C: 6.2 % — AB (ref <5.7)
MEAN PLASMA GLUCOSE: 131 mg/dL — AB (ref <117)

## 2013-04-12 LAB — LIPID PANEL
CHOLESTEROL: 171 mg/dL (ref 0–200)
HDL: 62 mg/dL (ref >39)
LDL Cholesterol: 82 mg/dL (ref 0–99)
Total CHOL/HDL Ratio: 2.8 Ratio
Triglycerides: 137 mg/dL (ref <150)
VLDL: 27 mg/dL (ref 0–40)

## 2013-04-12 LAB — MICROALBUMIN / CREATININE URINE RATIO
CREATININE, URINE: 160.6 mg/dL
Microalb Creat Ratio: 11.2 mg/g (ref 0.0–30.0)
Microalb, Ur: 1.8 mg/dL (ref 0.00–1.89)

## 2013-04-13 ENCOUNTER — Other Ambulatory Visit: Payer: Self-pay | Admitting: *Deleted

## 2013-04-13 MED ORDER — FUROSEMIDE 80 MG PO TABS: ORAL_TABLET | ORAL | Status: DC

## 2013-04-13 MED ORDER — LISINOPRIL 5 MG PO TABS: 5.0000 mg | ORAL_TABLET | Freq: Two times a day (BID) | ORAL | Status: DC

## 2013-04-13 MED ORDER — POTASSIUM CHLORIDE CRYS ER 20 MEQ PO TBCR: EXTENDED_RELEASE_TABLET | ORAL | Status: DC

## 2013-04-13 MED ORDER — ATORVASTATIN CALCIUM 40 MG PO TABS: 40.0000 mg | ORAL_TABLET | Freq: Every day | ORAL | Status: DC

## 2013-04-13 MED ORDER — METOPROLOL SUCCINATE ER 25 MG PO TB24: 25.0000 mg | ORAL_TABLET | Freq: Every day | ORAL | Status: DC

## 2013-04-13 MED ORDER — DABIGATRAN ETEXILATE MESYLATE 75 MG PO CAPS: 75.0000 mg | ORAL_CAPSULE | Freq: Two times a day (BID) | ORAL | Status: DC

## 2013-04-13 MED ORDER — DIGOXIN 250 MCG PO TABS: 0.2500 mg | ORAL_TABLET | Freq: Every day | ORAL | Status: DC

## 2013-04-13 NOTE — Telephone Encounter (Signed)
Refill appropriate and filled per protocol. 

## 2013-04-15 ENCOUNTER — Ambulatory Visit (INDEPENDENT_AMBULATORY_CARE_PROVIDER_SITE_OTHER): Payer: BC Managed Care – PPO | Admitting: Cardiovascular Disease

## 2013-04-15 ENCOUNTER — Encounter: Payer: Self-pay | Admitting: Cardiovascular Disease

## 2013-04-15 VITALS — BP 188/86 | HR 66 | Ht 68.0 in | Wt 209.0 lb

## 2013-04-15 DIAGNOSIS — Z7901 Long term (current) use of anticoagulants: Secondary | ICD-10-CM

## 2013-04-15 DIAGNOSIS — I10 Essential (primary) hypertension: Secondary | ICD-10-CM

## 2013-04-15 DIAGNOSIS — I4891 Unspecified atrial fibrillation: Secondary | ICD-10-CM

## 2013-04-15 MED ORDER — EDOXABAN TOSYLATE 60 MG PO TABS
60.0000 mg | ORAL_TABLET | Freq: Every day | ORAL | Status: DC
Start: 2013-04-15 — End: 2013-07-20

## 2013-04-15 MED ORDER — FUROSEMIDE 40 MG PO TABS: ORAL_TABLET | ORAL | Status: DC

## 2013-04-15 NOTE — Patient Instructions (Addendum)
Your physician recommends that you schedule a follow-up appointment in: 3 months    Your physician has recommended you make the following change in your medication:    STOP Pradaxa  START Savaysa  60 mg daily  DECREASE Lasix to 40 mg daily   Thank you for choosing [facility] !
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
## 2013-07-12 ENCOUNTER — Ambulatory Visit: Payer: Medicare Other | Admitting: Family Medicine

## 2013-07-20 ENCOUNTER — Ambulatory Visit (INDEPENDENT_AMBULATORY_CARE_PROVIDER_SITE_OTHER): Payer: BC Managed Care – PPO | Admitting: Adult Health

## 2013-07-20 ENCOUNTER — Encounter: Payer: Self-pay | Admitting: Adult Health

## 2013-07-20 VITALS — BP 132/70 | HR 56 | Ht 68.0 in | Wt 207.5 lb

## 2013-07-20 DIAGNOSIS — I10 Essential (primary) hypertension: Secondary | ICD-10-CM

## 2013-07-20 DIAGNOSIS — I4891 Unspecified atrial fibrillation: Secondary | ICD-10-CM

## 2013-07-20 MED ORDER — EDOXABAN TOSYLATE 60 MG PO TABS: 60.0000 mg | ORAL_TABLET | Freq: Every day | ORAL | Status: DC

## 2013-07-20 NOTE — Assessment & Plan Note (Signed)
Much better controlled currently on the lisinopril 5 mg daily. I will not make any changes in his medication regimen. He appears to be doing very well. We will see him again in 6 months. BMET has been ordered.

## 2013-07-20 NOTE — Progress Notes (Deleted)
Name: Phillip Wilson    DOB: [birth year]  Age: 68 y.o.  MR#: [id number]       PCP:  Vic Blackbird, MD      Insurance: Payor: [employer] [place] / Plan: BCBS [place] PPO / Product Type: *No Product type* /   CC:    Chief Complaint  Patient presents with  . Atrial Fibrillation  . Hypertension    VS Filed Vitals:   07/20/13 1406  BP: 132/70  Pulse: 56  Height: 5\' 8"  (1.727 m)  Weight: 207 lb 8 oz (94.121 kg)    Weights Current Weight  07/20/13 207 lb 8 oz (94.121 kg)  04/15/13 209 lb (94.802 kg)  04/11/13 210 lb (95.255 kg)    Blood Pressure  BP Readings from Last 3 Encounters:  07/20/13 132/70  04/15/13 188/86  04/11/13 148/72     Admit date:  (Not on file) Last encounter with RMR:  Visit date not found   Allergy Review of patient's allergies indicates no known allergies.  Current Outpatient Prescriptions  Medication Sig Dispense Refill  . atorvastatin (LIPITOR) 40 MG tablet Take 1 tablet (40 mg total) by mouth daily.  30 tablet  6  . digoxin (LANOXIN) 0.25 MG tablet Take 1 tablet (0.25 mg total) by mouth daily.  30 tablet  6  . Edoxaban Tosylate (SAVAYSA) 60 MG TABS Take 60 mg by mouth daily.  90 tablet  3  . furosemide (LASIX) 40 MG tablet TAKE ONE TABLET BY MOUTH EVERY DAY FOR  CONGESTIVE  HEART  FAILURE  30 tablet  6  . lisinopril (PRINIVIL,ZESTRIL) 5 MG tablet Take 1 tablet (5 mg total) by mouth 2 (two) times daily. FOR YOUR HEART AND BLOOD PRESSURE  60 tablet  6  . metoprolol succinate (TOPROL XL) 25 MG 24 hr tablet Take 1 tablet (25 mg total) by mouth daily. For blood presure  30 tablet  6  . potassium chloride SA (KLOR-CON M20) 20 MEQ tablet TAKE ONE TABLET BY MOUTH TWICE DAILY  30 tablet  6   No current facility-administered medications for this visit.    Discontinued Meds:   There are no discontinued medications.  Patient Active Problem List   Diagnosis Date Noted  . Routine general medical examination at a health care facility 05/03/2012  . Long term  current use of anticoagulant 06/02/2011  . Shoulder pain 06/02/2011  . Gastroesophageal reflux disease   . Diabetes mellitus, type 2   . Thrombocytopenia 01/24/2011  . Cardiomyopathy 12/28/2010  . Atrial fibrillation 12/28/2010  . Vitamin D deficiency 09/03/2010  . OBESITY 02/14/2008  . HYPERLIPIDEMIA 05/05/2006  . HYPERTENSION 04/02/2006    LABS    Component Value Date/Time   NA 140 04/11/2013 1553   NA 142 05/03/2012 1511   NA 142 10/28/2011 0930   K 3.8 04/11/2013 1553   K 3.7 05/03/2012 1511   K 4.0 10/28/2011 0930   CL 102 04/11/2013 1553   CL 105 05/03/2012 1511   CL 102 10/28/2011 0930   CO2 32 04/11/2013 1553   CO2 30 05/03/2012 1511   CO2 27 10/28/2011 0930   GLUCOSE 110* 04/11/2013 1553   GLUCOSE 108* 05/03/2012 1511   GLUCOSE 100* 10/28/2011 0930   BUN 11 04/11/2013 1553   BUN 11 05/03/2012 1511   BUN 13 10/28/2011 0930   CREATININE 1.15 04/11/2013 1553   CREATININE 1.03 05/03/2012 1511   CREATININE 1.18 10/28/2011 0930   CREATININE 1.37* 01/26/2011 0457   CREATININE 1.29  01/25/2011 0455   CREATININE 1.22 01/24/2011 0432   CALCIUM 8.7 04/11/2013 1553   CALCIUM 9.0 05/03/2012 1511   CALCIUM 9.1 10/28/2011 0930   GFRNONAA 53* 01/26/2011 0457   GFRNONAA 57* 01/25/2011 0455   GFRNONAA 61* 01/24/2011 0432   GFRAA 62* 01/26/2011 0457   GFRAA 67* 01/25/2011 0455   GFRAA 71* 01/24/2011 0432   CMP     Component Value Date/Time   NA 140 04/11/2013 1553   K 3.8 04/11/2013 1553   CL 102 04/11/2013 1553   CO2 32 04/11/2013 1553   GLUCOSE 110* 04/11/2013 1553   BUN 11 04/11/2013 1553   CREATININE 1.15 04/11/2013 1553   CREATININE 1.37* 01/26/2011 0457   CALCIUM 8.7 04/11/2013 1553   PROT 6.4 04/11/2013 1553   ALBUMIN 3.8 04/11/2013 1553   AST 25 04/11/2013 1553   ALT 12 04/11/2013 1553   ALKPHOS 88 04/11/2013 1553   BILITOT 0.7 04/11/2013 1553   GFRNONAA 53* 01/26/2011 0457   GFRAA 62* 01/26/2011 0457       Component Value Date/Time   WBC 7.0 04/11/2013 1553   WBC 7.5 05/03/2012 1511   WBC  6.7 10/28/2011 0930   HGB 14.6 04/11/2013 1553   HGB 14.7 05/03/2012 1511   HGB 14.7 10/28/2011 0930   HCT 45.2 04/11/2013 1553   HCT 43.9 05/03/2012 1511   HCT 44.1 10/28/2011 0930   MCV 84.6 04/11/2013 1553   MCV 81.1 05/03/2012 1511   MCV 83.2 10/28/2011 0930    Lipid Panel     Component Value Date/Time   CHOL 171 04/11/2013 1553   TRIG 137 04/11/2013 1553   HDL 62 04/11/2013 1553   CHOLHDL 2.8 04/11/2013 1553   VLDL 27 04/11/2013 1553   LDLCALC 82 04/11/2013 1553    ABG No results found for this basename: phart, pco2, pco2art, po2, po2art, hco3, tco2, acidbasedef, o2sat     Lab Results  Component Value Date   TSH 1.425 01/22/2011   BNP (last 3 results) No results found for this basename: PROBNP,  in the last 8760 hours Cardiac Panel (last 3 results) No results found for this basename: CKTOTAL, CKMB, TROPONINI, RELINDX,  in the last 72 hours  Iron/TIBC/Ferritin No results found for this basename: iron, tibc, ferritin     EKG Orders placed in visit on 10/25/12  . EKG 12-LEAD     Prior Assessment and Plan Problem List as of 07/20/2013     Cardiovascular and Mediastinum   HYPERTENSION   Last Assessment & Plan   04/11/2013 Office Visit Written 04/11/2013  3:50 PM by Alycia Rossetti, MD     Bp elevated some today, medications refilled    Cardiomyopathy   Last Assessment & Plan   03/26/2011 Office Visit Written 03/26/2011  2:46 PM by Yehuda Savannah, MD     Etiology of cardiomyopathy is uncertain.  Patient reports a negative stress test approximately one year before he presented with congestive heart failure.  2 possibilities are a tachycardia-related process secondary to his atrial fibrillation or alcohol-induced.  He was consuming 4 beers per day prior to his hospital admission, but has decreased this by 90%.  Echocardiography will be repeated to reassess left ventricular function now that his arrhythmia and excessive alcohol use had been discontinued.    Atrial fibrillation    Last Assessment & Plan   04/11/2013 Office Visit Written 04/11/2013  7:00 PM by Alycia Rossetti, MD     He is unfortunately noncompliant with his  Coumadin and often stops taking the medication also he does not follow up on a regular basis for INR monitoring. I think that this is too high risk to continue the medication I started him on Pradaxa 75 mg twice a day, we have samples in the office He will f/u with cardiology on Friday. I will see if they have any other medications they would prefer instead of the Pradaxa but I would not restart him on Coumadin at this time      Digestive   Gastroesophageal reflux disease     Endocrine   Diabetes mellitus, type 2   Last Assessment & Plan   04/11/2013 Office Visit Written 04/11/2013  3:52 PM by Alycia Rossetti, MD     Diet controlled, on ACE, statin      Other   HYPERLIPIDEMIA   Last Assessment & Plan   04/11/2013 Office Visit Written 04/11/2013  3:51 PM by Alycia Rossetti, MD     Check non fasting labs, he does not have lipitor with him, I am not sure what he is taking    OBESITY   Last Assessment & Plan   05/03/2012 Office Visit Written 05/03/2012  3:42 PM by Alycia Rossetti, MD     Continue to discuss proper diet and activity.    Vitamin D deficiency   Last Assessment & Plan   09/17/2010 Office Visit Written 09/17/2010  5:14 PM by Alycia Rossetti, MD     His vitamin D levels are normal. He will pick up of vitamin with calcium and vitamin D    Thrombocytopenia   Long term current use of anticoagulant   Last Assessment & Plan   09/29/2012 Office Visit Written 09/29/2012  9:38 PM by Alycia Rossetti, MD     Coumadin 5mg  daily for the next week, f/u Monday for recheck    Shoulder pain   Last Assessment & Plan   06/02/2011 Office Visit Written 06/02/2011  8:39 PM by Alycia Rossetti, MD     With his cardiomyopathy and CHF that was recent, will hold on oral meds and try topical NSAID if pt can get this covered by insurance. If no improvement then will  send to ortho for evaluation    Routine general medical examination at a health care facility   Last Assessment & Plan   05/03/2012 Office Visit Written 05/03/2012  3:40 PM by Alycia Rossetti, MD     Doing well, shots UTD Colonoscopy UTD Fasting labs        Imaging: No results found.

## 2013-07-20 NOTE — Assessment & Plan Note (Signed)
Patient's heart rate is well-controlled currently. Edoxaban without evidence of bleeding or significant bruising. Will continue current medication regimen as directed. He will have a CBC and a BMET in one month for kidney function and evaluation for anemia on anticoagulant.
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
## 2013-07-20 NOTE — Patient Instructions (Addendum)
Your physician recommends that you schedule a follow-up appointment in: 6 months with Dr Virgina Jock will receive a reminder letter two months in advance reminding you to call and schedule your appointment. If you don't receive this letter, please contact our office.  Your physician recommends that you return for lab work in: 1 month Around July 24th BMET, CBC

## 2013-07-25 ENCOUNTER — Encounter (HOSPITAL_COMMUNITY): Payer: Self-pay | Admitting: Emergency Medicine

## 2013-07-25 ENCOUNTER — Emergency Department (HOSPITAL_COMMUNITY): Payer: BC Managed Care – PPO

## 2013-07-25 ENCOUNTER — Emergency Department (HOSPITAL_COMMUNITY)
Admission: EM | Admit: 2013-07-25 | Discharge: 2013-07-26 | Disposition: A | Discharge status: Home / Self Care | Payer: BC Managed Care – PPO | Attending: Emergency Medicine | Admitting: Emergency Medicine

## 2013-07-25 DIAGNOSIS — S0993XA Unspecified injury of face, initial encounter: Secondary | ICD-10-CM

## 2013-07-25 DIAGNOSIS — Y9389 Activity, other specified: Secondary | ICD-10-CM | POA: Insufficient documentation

## 2013-07-25 DIAGNOSIS — S01501A Unspecified open wound of lip, initial encounter: Secondary | ICD-10-CM | POA: Insufficient documentation

## 2013-07-25 DIAGNOSIS — Y929 Unspecified place or not applicable: Secondary | ICD-10-CM | POA: Insufficient documentation

## 2013-07-25 DIAGNOSIS — W208XXA Other cause of strike by thrown, projected or falling object, initial encounter: Secondary | ICD-10-CM | POA: Insufficient documentation

## 2013-07-25 DIAGNOSIS — E785 Hyperlipidemia, unspecified: Secondary | ICD-10-CM | POA: Insufficient documentation

## 2013-07-25 DIAGNOSIS — I10 Essential (primary) hypertension: Secondary | ICD-10-CM | POA: Insufficient documentation

## 2013-07-25 DIAGNOSIS — K219 Gastro-esophageal reflux disease without esophagitis: Secondary | ICD-10-CM | POA: Insufficient documentation

## 2013-07-25 DIAGNOSIS — M1990 Unspecified osteoarthritis, unspecified site: Secondary | ICD-10-CM | POA: Insufficient documentation

## 2013-07-25 DIAGNOSIS — S01511A Laceration without foreign body of lip, initial encounter: Secondary | ICD-10-CM

## 2013-07-25 DIAGNOSIS — I4891 Unspecified atrial fibrillation: Secondary | ICD-10-CM | POA: Insufficient documentation

## 2013-07-25 DIAGNOSIS — E119 Type 2 diabetes mellitus without complications: Secondary | ICD-10-CM | POA: Insufficient documentation

## 2013-07-25 LAB — CBC WITH DIFFERENTIAL/PLATELET
BASOS ABS: 0 10*3/uL (ref 0.0–0.1)
BASOS PCT: 0 % (ref 0–1)
Eosinophils Absolute: 0.1 10*3/uL (ref 0.0–0.7)
Eosinophils Relative: 1 % (ref 0–5)
HEMATOCRIT: 41 % (ref 39.0–52.0)
Hemoglobin: 13.8 g/dL (ref 13.0–17.0)
Lymphocytes Relative: 23 % (ref 12–46)
Lymphs Abs: 1.6 10*3/uL (ref 0.7–4.0)
MCH: 28.2 pg (ref 26.0–34.0)
MCHC: 33.7 g/dL (ref 30.0–36.0)
MCV: 83.8 fL (ref 78.0–100.0)
MONO ABS: 0.5 10*3/uL (ref 0.1–1.0)
Monocytes Relative: 8 % (ref 3–12)
Neutro Abs: 4.9 10*3/uL (ref 1.7–7.7)
Neutrophils Relative %: 68 % (ref 43–77)
Platelets: 146 10*3/uL — ABNORMAL LOW (ref 150–400)
RBC: 4.89 MIL/uL (ref 4.22–5.81)
RDW: 14.3 % (ref 11.5–15.5)
WBC: 7.1 10*3/uL (ref 4.0–10.5)

## 2013-07-25 LAB — BASIC METABOLIC PANEL
BUN: 10 mg/dL (ref 6–23)
CHLORIDE: 100 meq/L (ref 96–112)
CO2: 31 mEq/L (ref 19–32)
CREATININE: 1.13 mg/dL (ref 0.50–1.35)
Calcium: 8.7 mg/dL (ref 8.4–10.5)
GFR, EST AFRICAN AMERICAN: 76 mL/min — AB (ref >90)
GFR, EST NON AFRICAN AMERICAN: 66 mL/min — AB (ref >90)
Glucose, Bld: 129 mg/dL — ABNORMAL HIGH (ref 70–99)
Potassium: 3.5 mEq/L — ABNORMAL LOW (ref 3.7–5.3)
Sodium: 140 mEq/L (ref 137–147)

## 2013-07-25 LAB — PROTIME-INR
INR: 1.2 (ref 0.00–1.49)
PROTHROMBIN TIME: 15.2 s (ref 11.6–15.2)

## 2013-07-25 MED ORDER — SODIUM CHLORIDE 0.9 % IV SOLN
INTRAVENOUS | Status: DC
  Administered 2013-07-25 – 2013-07-26 (×2): via INTRAVENOUS

## 2013-07-25 MED ORDER — TETANUS-DIPHTH-ACELL PERTUSSIS 5-2.5-18.5 LF-MCG/0.5 IM SUSP
0.5000 mL | Freq: Once | INTRAMUSCULAR | Status: DC
  Filled 2013-07-25: qty 0.5

## 2013-07-25 NOTE — ED Notes (Signed)
Pt had a transmission fell on lip, bottom lip with gash and continues to bleed, pt denies any loose teeth
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
## 2013-07-25 NOTE — ED Notes (Signed)
Low lip lac, when working on a car

## 2013-07-26 ENCOUNTER — Emergency Department (HOSPITAL_COMMUNITY): Payer: BC Managed Care – PPO

## 2013-07-26 MED ORDER — AMOXICILLIN-POT CLAVULANATE 875-125 MG PO TABS: 1.0000 | ORAL_TABLET | Freq: Two times a day (BID) | ORAL | Status: DC

## 2013-07-26 MED ORDER — CLINDAMYCIN PHOSPHATE 600 MG/50ML IV SOLN
600.0000 mg | Freq: Once | INTRAVENOUS | Status: AC
  Administered 2013-07-26: 600 mg via INTRAVENOUS
  Filled 2013-07-26: qty 50

## 2013-07-26 MED ORDER — HYDROCODONE-ACETAMINOPHEN 5-325 MG PO TABS: 1.0000 | ORAL_TABLET | ORAL | Status: DC | PRN

## 2013-07-26 NOTE — Discharge Instructions (Signed)
Absorbable Suture Repair Absorbable sutures (stitches) hold skin together so you can heal. Keep skin wounds clean and dry for the next 2 to 3 days. Then, you may gently wash your wound and dress it with an antibiotic ointment as recommended. As your wound begins to heal, the sutures are no longer needed, and they typically begin to fall off. This will take 7 to 10 days. After 10 days, if your sutures are loose, you can remove them by wiping with a clean gauze pad or a cotton ball. Do not pull your sutures out. They should wipe away easily. If after 10 days they do not easily wipe away, have your caregiver take them out. Absorbable sutures may be used deep in a wound to help hold it together. If these stitches are below the skin, the body will absorb them completely in 3 to 4 weeks.  You may need a tetanus shot if:  You cannot remember when you had your last tetanus shot.  You have never had a tetanus shot. If you get a tetanus shot, your arm may swell, get red, and feel warm to the touch. This is common and not a problem. If you need a tetanus shot and you choose not to have one, there is a rare chance of getting tetanus. Sickness from tetanus can be serious. SEEK IMMEDIATE MEDICAL CARE IF:  You have redness in the wound area.  The wound area feels hot to the touch.  You develop swelling in the wound area.  You develop pain.  There is fluid drainage from the wound. Document Released: 02/21/2004 Document Revised: 04/07/2011 Document Reviewed: 06/04/2010 [facility] Patient Information 2015 [place], [place]. This information is not intended to replace advice given to you by your health care provider. Make sure you discuss any questions you have with your health care provider.  Facial Laceration  A facial laceration is a cut on the face. These injuries can be painful and cause bleeding. Lacerations usually heal quickly, but they need special care to reduce scarring. DIAGNOSIS  Your health care  provider will take a medical history, ask for details about how the injury occurred, and examine the wound to determine how deep the cut is. TREATMENT  Some facial lacerations may not require closure. Others may not be able to be closed because of an increased risk of infection. The risk of infection and the chance for successful closure will depend on various factors, including the amount of time since the injury occurred. The wound may be cleaned to help prevent infection. If closure is appropriate, pain medicines may be given if needed. Your health care provider will use stitches (sutures), wound glue (adhesive), or skin adhesive strips to repair the laceration. These tools bring the skin edges together to allow for faster healing and a better cosmetic outcome. If needed, you may also be given a tetanus shot. HOME CARE INSTRUCTIONS  Only take over-the-counter or prescription medicines as directed by your health care provider.  Follow your health care provider's instructions for wound care. These instructions will vary depending on the technique used for closing the wound. For Sutures:  Keep the wound clean and dry.   If you were given a bandage (dressing), you should change it at least once a day. Also change the dressing if it becomes wet or dirty, or as directed by your health care provider.   Wash the wound with soap and water 2 times a day. Rinse the wound off with water to remove all  soap. Pat the wound dry with a clean towel.   After cleaning, apply a thin layer of the antibiotic ointment recommended by your health care provider. This will help prevent infection and keep the dressing from sticking.   You may shower as usual after the first 24 hours. Do not soak the wound in water until the sutures are removed.   Get your sutures removed as directed by your health care provider. With facial lacerations, sutures should usually be taken out after 4-5 days to avoid stitch marks.    Wait a few days after your sutures are removed before applying any makeup. For Skin Adhesive Strips:  Keep the wound clean and dry.   Do not get the skin adhesive strips wet. You may bathe carefully, using caution to keep the wound dry.   If the wound gets wet, pat it dry with a clean towel.   Skin adhesive strips will fall off on their own. You may trim the strips as the wound heals. Do not remove skin adhesive strips that are still stuck to the wound. They will fall off in time.  For Wound Adhesive:  You may briefly wet your wound in the shower or bath. Do not soak or scrub the wound. Do not swim. Avoid periods of heavy sweating until the skin adhesive has fallen off on its own. After showering or bathing, gently pat the wound dry with a clean towel.   Do not apply liquid medicine, cream medicine, ointment medicine, or makeup to your wound while the skin adhesive is in place. This may loosen the film before your wound is healed.   If a dressing is placed over the wound, be careful not to apply tape directly over the skin adhesive. This may cause the adhesive to be pulled off before the wound is healed.   Avoid prolonged exposure to sunlight or tanning lamps while the skin adhesive is in place.  The skin adhesive will usually remain in place for 5-10 days, then naturally fall off the skin. Do not pick at the adhesive film.  After Healing: Once the wound has healed, cover the wound with sunscreen during the day for 1 full year. This can help minimize scarring. Exposure to ultraviolet light in the first year will darken the scar. It can take 1-2 years for the scar to lose its redness and to heal completely.  SEEK IMMEDIATE MEDICAL CARE IF:  You have redness, pain, or swelling around the wound.   You see ayellowish-white fluid (pus) coming from the wound.   You have chills or a fever.  MAKE SURE YOU:  Understand these instructions.  Will watch your condition.  Will  get help right away if you are not doing well or get worse. Document Released: 02/21/2004 Document Revised: 11/03/2012 Document Reviewed: 08/26/2012 [facility] Patient Information 2015 [place], [place]. This information is not intended to replace advice given to you by your health care provider. Make sure you discuss any questions you have with your health care provider.  Head Injury You have received a head injury. It does not appear serious at this time. Headaches and vomiting are common following head injury. It should be easy to awaken from sleeping. Sometimes it is necessary for you to stay in the emergency department for a while for observation. Sometimes admission to the hospital may be needed. After injuries such as yours, most problems occur within the first 24 hours, but side effects may occur up to 7-10 days after the injury.  It is important for you to carefully monitor your condition and contact your health care provider or seek immediate medical care if there is a change in your condition. WHAT ARE THE TYPES OF HEAD INJURIES? Head injuries can be as minor as a bump. Some head injuries can be more severe. More severe head injuries include:  A jarring injury to the brain (concussion).  A bruise of the brain (contusion). This mean there is bleeding in the brain that can cause swelling.  A cracked skull (skull fracture).  Bleeding in the brain that collects, clots, and forms a bump (hematoma). WHAT CAUSES A HEAD INJURY? A serious head injury is most likely to happen to someone who is in a car wreck and is not wearing a seat belt. Other causes of major head injuries include bicycle or motorcycle accidents, sports injuries, and falls. HOW ARE HEAD INJURIES DIAGNOSED? A complete history of the event leading to the injury and your current symptoms will be helpful in diagnosing head injuries. Many times, pictures of the brain, such as CT or MRI are needed to see the extent of the injury. Often,  an overnight hospital stay is necessary for observation.  WHEN SHOULD I SEEK IMMEDIATE MEDICAL CARE?  You should get help right away if:  You have confusion or drowsiness.  You feel sick to your stomach (nauseous) or have continued, forceful vomiting.  You have dizziness or unsteadiness that is getting worse.  You have severe, continued headaches not relieved by medicine. Only take over-the-counter or prescription medicines for pain, fever, or discomfort as directed by your health care provider.  You do not have normal function of the arms or legs or are unable to walk.  You notice changes in the black spots in the center of the colored part of your eye (pupil).  You have a clear or bloody fluid coming from your nose or ears.  You have a loss of vision. During the next 24 hours after the injury, you must stay with someone who can watch you for the warning signs. This person should contact local emergency services (911 in the [place] if you have seizures, you become unconscious, or you are unable to wake up. HOW CAN I PREVENT A HEAD INJURY IN THE FUTURE? The most important factor for preventing major head injuries is avoiding motor vehicle accidents. To minimize the potential for damage to your head, it is crucial to wear seat belts while riding in motor vehicles. Wearing helmets while bike riding and playing collision sports (like football) is also helpful. Also, avoiding dangerous activities around the house will further help reduce your risk of head injury.  WHEN CAN I RETURN TO NORMAL ACTIVITIES AND ATHLETICS? You should be reevaluated by your health care provider before returning to these activities. If you have any of the following symptoms, you should not return to activities or contact sports until 1 week after the symptoms have stopped:  Persistent headache.  Dizziness or vertigo.  Poor attention and concentration.  Confusion.  Memory problems.  Nausea or  vomiting.  Fatigue or tire easily.  Irritability.  Intolerant of bright lights or loud noises.  Anxiety or depression.  Disturbed sleep. MAKE SURE YOU:   Understand these instructions.  Will watch your condition.  Will get help right away if you are not doing well or get worse. Document Released: 01/13/2005 Document Revised: 01/18/2013 Document Reviewed: 09/20/2012 [facility] Patient Information 2015 [place], [place]. This information is not intended to replace advice given to  you by your health care provider. Make sure you discuss any questions you have with your health care provider.

## 2013-07-26 NOTE — ED Notes (Signed)
Pt transferred to [facility] ED via [place].

## 2013-07-26 NOTE — ED Notes (Signed)
ENT MD at bedside suturing pt.'s lip laceration .

## 2013-07-26 NOTE — Procedures (Signed)
Procedure: Closed reduction of mandibular alveolar ridge fracture and complex repair of lower lip laceration (3cm)    Anesthesia: Local anesthesia with 1% lidocaine with 1:100,000 epinephrine  Description: The patient is placed supine on the operating table. The lower lip is prepped and draped in a sterile fashion.  After adequate local anesthesia is achieved, the laceration site is extensively and carefully debrided.  Due to the asymmetry of the laceration, a rotational flap is fashioned from the soft tissue anterior to the laceration site. Soft tissue undermining is performed to release the mucosal skin tension. The laceration is closed with interrupted 4-0 chromic sutures. Good hemostasis was achieved.  Attention was then focused on the alveolar ridge fracture. A fracture fragment was manually reduced.  The patient tolerated the procedure well.
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
## 2013-07-26 NOTE — ED Notes (Signed)
Patient transported to CT 
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
## 2013-07-29 NOTE — ED Provider Notes (Signed)
History/physical exam/procedure(s) were performed by non-physician practitioner and as supervising physician I was immediately available for consultation/collaboration. I have reviewed all notes and am in agreement with care and plan.   Shaune Pollack, MD 07/29/13 [phone]

## 2013-08-06 ENCOUNTER — Other Ambulatory Visit: Payer: Self-pay | Admitting: *Deleted

## 2013-08-06 DIAGNOSIS — E119 Type 2 diabetes mellitus without complications: Secondary | ICD-10-CM

## 2013-08-06 DIAGNOSIS — I10 Essential (primary) hypertension: Secondary | ICD-10-CM

## 2013-08-06 DIAGNOSIS — E785 Hyperlipidemia, unspecified: Secondary | ICD-10-CM

## 2013-08-24 ENCOUNTER — Ambulatory Visit: Payer: BC Managed Care – PPO | Admitting: Family Medicine

## 2013-08-26 ENCOUNTER — Ambulatory Visit (INDEPENDENT_AMBULATORY_CARE_PROVIDER_SITE_OTHER): Payer: BC Managed Care – PPO | Admitting: Family Medicine

## 2013-08-26 ENCOUNTER — Encounter: Payer: Self-pay | Admitting: Family Medicine

## 2013-08-26 VITALS — BP 132/76 | HR 82 | Temp 98.4°F | Resp 14 | Ht 68.0 in | Wt 194.0 lb

## 2013-08-26 DIAGNOSIS — E785 Hyperlipidemia, unspecified: Secondary | ICD-10-CM | POA: Diagnosis not present

## 2013-08-26 DIAGNOSIS — E119 Type 2 diabetes mellitus without complications: Secondary | ICD-10-CM | POA: Diagnosis not present

## 2013-08-26 DIAGNOSIS — E669 Obesity, unspecified: Secondary | ICD-10-CM

## 2013-08-26 DIAGNOSIS — I10 Essential (primary) hypertension: Secondary | ICD-10-CM | POA: Diagnosis not present

## 2013-08-26 LAB — CBC WITH DIFFERENTIAL/PLATELET
BASOS PCT: 1 % (ref 0–1)
Basophils Absolute: 0.1 10*3/uL (ref 0.0–0.1)
EOS ABS: 0.1 10*3/uL (ref 0.0–0.7)
Eosinophils Relative: 1 % (ref 0–5)
HCT: 44.3 % (ref 39.0–52.0)
Hemoglobin: 14.9 g/dL (ref 13.0–17.0)
Lymphocytes Relative: 29 % (ref 12–46)
Lymphs Abs: 1.9 10*3/uL (ref 0.7–4.0)
MCH: 28 pg (ref 26.0–34.0)
MCHC: 33.6 g/dL (ref 30.0–36.0)
MCV: 83.1 fL (ref 78.0–100.0)
Monocytes Absolute: 0.4 10*3/uL (ref 0.1–1.0)
Monocytes Relative: 6 % (ref 3–12)
Neutro Abs: 4.1 10*3/uL (ref 1.7–7.7)
Neutrophils Relative %: 63 % (ref 43–77)
PLATELETS: 144 10*3/uL — AB (ref 150–400)
RBC: 5.33 MIL/uL (ref 4.22–5.81)
RDW: 15.5 % (ref 11.5–15.5)
WBC: 6.5 10*3/uL (ref 4.0–10.5)

## 2013-08-26 LAB — COMPLETE METABOLIC PANEL WITH GFR
AST: 19 U/L (ref 0–37)
Albumin: 4.1 g/dL (ref 3.5–5.2)
Alkaline Phosphatase: 74 U/L (ref 39–117)
BILIRUBIN TOTAL: 1.9 mg/dL — AB (ref 0.2–1.2)
BUN: 11 mg/dL (ref 6–23)
CO2: 30 mEq/L (ref 19–32)
CREATININE: 1.18 mg/dL (ref 0.50–1.35)
Calcium: 9.3 mg/dL (ref 8.4–10.5)
Chloride: 99 mEq/L (ref 96–112)
GFR, Est African American: 74 mL/min
GFR, Est Non African American: 64 mL/min
Glucose, Bld: 111 mg/dL — ABNORMAL HIGH (ref 70–99)
Potassium: 3.8 mEq/L (ref 3.5–5.3)
SODIUM: 140 meq/L (ref 135–145)
TOTAL PROTEIN: 6.9 g/dL (ref 6.0–8.3)

## 2013-08-26 LAB — HEMOGLOBIN A1C
Hgb A1c MFr Bld: 6.2 % — ABNORMAL HIGH (ref <5.7)
MEAN PLASMA GLUCOSE: 131 mg/dL — AB (ref <117)

## 2013-08-26 MED ORDER — POTASSIUM CHLORIDE CRYS ER 20 MEQ PO TBCR: 20.0000 meq | EXTENDED_RELEASE_TABLET | Freq: Two times a day (BID) | ORAL | Status: DC

## 2013-08-26 MED ORDER — FUROSEMIDE 80 MG PO TABS: 80.0000 mg | ORAL_TABLET | Freq: Every morning | ORAL | Status: DC

## 2013-08-26 MED ORDER — METOPROLOL SUCCINATE ER 25 MG PO TB24: 25.0000 mg | ORAL_TABLET | Freq: Every day | ORAL | Status: DC

## 2013-08-26 MED ORDER — DIGOXIN 250 MCG PO TABS: 0.2500 mg | ORAL_TABLET | Freq: Every day | ORAL | Status: DC

## 2013-08-26 MED ORDER — ATORVASTATIN CALCIUM 40 MG PO TABS: 40.0000 mg | ORAL_TABLET | Freq: Every day | ORAL | Status: DC

## 2013-08-26 MED ORDER — LISINOPRIL 5 MG PO TABS: 5.0000 mg | ORAL_TABLET | Freq: Two times a day (BID) | ORAL | Status: DC

## 2013-08-26 NOTE — Assessment & Plan Note (Signed)
Weight loss noted he will continue with the wellness program

## 2013-08-26 NOTE — Addendum Note (Signed)
Addended by: Vic Blackbird F on: 08/26/2013 01:11 PM   Modules accepted: Orders

## 2013-08-26 NOTE — Patient Instructions (Signed)
Continue current medications We will call with results F/U 4 months

## 2013-08-26 NOTE — Progress Notes (Signed)
Patient ID: Phillip Wilson, male   DOB: Oct 23, 1945, 68 y.o.   MRN: [id number]   Subjective:    Patient ID: Phillip Wilson, male    DOB: April 07, 1945, 68 y.o.   MRN: [id number]  Patient presents for 3 month F/U  patient here follow chronic medical problems. He has no specific concerns today states he is doing very well. He is on the wellness program at his job and has lost 14 pounds since her last visit. He did see the cardiologist at his last visit blood pressure was a little elevated however he has been taking his medications on a regular basis. His blood thinner was also changed by cardiology for his atrial fibrillation as we have had compliance issues with the Coumadin. He is due for repeat 123456 and metabolic panel to have his potassium rechecked    Review Of Systems:  GEN- denies fatigue, fever, weight loss,weakness, recent illness HEENT- denies eye drainage, change in vision, nasal discharge, CVS- denies chest pain, palpitations RESP- denies SOB, cough, wheeze ABD- denies N/V, change in stools, abd pain GU- denies dysuria, hematuria, dribbling, incontinence MSK- denies joint pain, muscle aches, injury Neuro- denies headache, dizziness, syncope, seizure activity       Objective:    BP 132/76  Pulse 82  Temp(Src) 98.4 F (36.9 C) (Oral)  Resp 14  Ht 5\' 8"  (1.727 m)  Wt 194 lb (87.998 kg)  BMI 29.50 kg/m2 GEN- NAD, alert and oriented x3 HEENT- PERRL, EOMI, non injected sclera, pink conjunctiva, MMM, oropharynx clear CVS- RRR, no murmur RESP-CTAB EXT- No edema Pulses- Radial, DP- 2+        Assessment & Plan:      Problem List Items Addressed This Visit   HYPERTENSION - Primary   HYPERLIPIDEMIA   Diabetes mellitus, type 2    Other Visit Diagnoses   Type II or unspecified type diabetes mellitus without mention of complication, not stated as uncontrolled           Note: This dictation was prepared with Dragon dictation along with smaller phrase technology. Any  transcriptional errors that result from this process are unintentional.

## 2013-08-26 NOTE — Assessment & Plan Note (Signed)
Blood pressure significantly improved we'll continue his current medication

## 2013-08-26 NOTE — Assessment & Plan Note (Signed)
Diet control recheck A1c I assume that this will be significantly improved now that he has lost 14 pounds

## 2013-08-29 ENCOUNTER — Encounter: Payer: Self-pay | Admitting: *Deleted

## 2013-08-31 ENCOUNTER — Telehealth: Payer: Self-pay | Admitting: *Deleted

## 2013-08-31 NOTE — Telephone Encounter (Signed)
Received call from Bulger, pharmacy tech with [facility].  Reports that they are unable to fill Lanoxin d/t manufacturer shortage, and requested to dispense with digoxin.   Verbal order given.

## 2013-12-26 ENCOUNTER — Encounter: Payer: Self-pay | Admitting: Family Medicine

## 2013-12-26 ENCOUNTER — Ambulatory Visit (INDEPENDENT_AMBULATORY_CARE_PROVIDER_SITE_OTHER): Payer: BC Managed Care – PPO | Admitting: Family Medicine

## 2013-12-26 VITALS — BP 132/78 | HR 64 | Temp 98.4°F | Resp 16 | Ht 68.0 in | Wt 197.0 lb

## 2013-12-26 DIAGNOSIS — I4891 Unspecified atrial fibrillation: Secondary | ICD-10-CM | POA: Diagnosis not present

## 2013-12-26 DIAGNOSIS — E669 Obesity, unspecified: Secondary | ICD-10-CM | POA: Diagnosis not present

## 2013-12-26 DIAGNOSIS — E119 Type 2 diabetes mellitus without complications: Secondary | ICD-10-CM | POA: Diagnosis not present

## 2013-12-26 DIAGNOSIS — E785 Hyperlipidemia, unspecified: Secondary | ICD-10-CM | POA: Diagnosis not present

## 2013-12-26 DIAGNOSIS — I10 Essential (primary) hypertension: Secondary | ICD-10-CM

## 2013-12-26 DIAGNOSIS — Z23 Encounter for immunization: Secondary | ICD-10-CM

## 2013-12-26 LAB — COMPREHENSIVE METABOLIC PANEL
ALBUMIN: 3.7 g/dL (ref 3.5–5.2)
ALK PHOS: 93 U/L (ref 39–117)
ALT: 9 U/L (ref 0–53)
AST: 17 U/L (ref 0–37)
BUN: 9 mg/dL (ref 6–23)
CALCIUM: 8.8 mg/dL (ref 8.4–10.5)
CHLORIDE: 104 meq/L (ref 96–112)
CO2: 28 mEq/L (ref 19–32)
Creat: 0.99 mg/dL (ref 0.50–1.35)
Glucose, Bld: 99 mg/dL (ref 70–99)
Potassium: 4.1 mEq/L (ref 3.5–5.3)
SODIUM: 139 meq/L (ref 135–145)
Total Bilirubin: 1.2 mg/dL (ref 0.2–1.2)
Total Protein: 6.2 g/dL (ref 6.0–8.3)

## 2013-12-26 LAB — CBC WITH DIFFERENTIAL/PLATELET
Basophils Absolute: 0.1 10*3/uL (ref 0.0–0.1)
Basophils Relative: 1 % (ref 0–1)
EOS PCT: 1 % (ref 0–5)
Eosinophils Absolute: 0.1 10*3/uL (ref 0.0–0.7)
HEMATOCRIT: 40.4 % (ref 39.0–52.0)
Hemoglobin: 13.7 g/dL (ref 13.0–17.0)
LYMPHS ABS: 1.5 10*3/uL (ref 0.7–4.0)
Lymphocytes Relative: 25 % (ref 12–46)
MCH: 27.6 pg (ref 26.0–34.0)
MCHC: 33.9 g/dL (ref 30.0–36.0)
MCV: 81.3 fL (ref 78.0–100.0)
MPV: 12.5 fL — ABNORMAL HIGH (ref 9.4–12.4)
Monocytes Absolute: 0.4 10*3/uL (ref 0.1–1.0)
Monocytes Relative: 6 % (ref 3–12)
NEUTROS ABS: 4 10*3/uL (ref 1.7–7.7)
Neutrophils Relative %: 67 % (ref 43–77)
Platelets: 155 10*3/uL (ref 150–400)
RBC: 4.97 MIL/uL (ref 4.22–5.81)
RDW: 15.2 % (ref 11.5–15.5)
WBC: 5.9 10*3/uL (ref 4.0–10.5)

## 2013-12-26 LAB — LIPID PANEL
Cholesterol: 112 mg/dL (ref 0–200)
HDL: 56 mg/dL (ref >39)
LDL CALC: 45 mg/dL (ref 0–99)
Total CHOL/HDL Ratio: 2 Ratio
Triglycerides: 57 mg/dL (ref <150)
VLDL: 11 mg/dL (ref 0–40)

## 2013-12-26 LAB — HEMOGLOBIN A1C
HEMOGLOBIN A1C: 6.1 % — AB (ref <5.7)
MEAN PLASMA GLUCOSE: 128 mg/dL — AB (ref <117)

## 2013-12-26 MED ORDER — LISINOPRIL 5 MG PO TABS: 5.0000 mg | ORAL_TABLET | Freq: Two times a day (BID) | ORAL | Status: DC

## 2013-12-26 MED ORDER — FUROSEMIDE 80 MG PO TABS: 80.0000 mg | ORAL_TABLET | Freq: Every morning | ORAL | Status: DC

## 2013-12-26 MED ORDER — EDOXABAN TOSYLATE 60 MG PO TABS: 60.0000 mg | ORAL_TABLET | Freq: Every day | ORAL | Status: DC

## 2013-12-26 NOTE — Assessment & Plan Note (Signed)
NSR on Sayvasa blood thinner

## 2013-12-26 NOTE — Progress Notes (Signed)
Patient ID: Phillip Wilson, male   DOB: Aug 25, 1945, 68 y.o.   MRN: [id number]   Subjective:    Patient ID: Phillip Wilson, male    DOB: 10-19-1945, 68 y.o.   MRN: [id number]  Patient presents for 4 month F/U     Patient here to follow-up chronic medical problems. He has no specific concerns today. He is due for fasting labs. He is due for flu shot. His medications were reviewed. He has not had any chest pain shortness of breath or palpitations. He is still being followed by cardiology for his atrial fibrillation.  His history of diabetes mellitus is diet controlled  Review Of Systems:  GEN- denies fatigue, fever, weight loss,weakness, recent illness HEENT- denies eye drainage, change in vision, nasal discharge, CVS- denies chest pain, palpitations RESP- denies SOB, cough, wheeze ABD- denies N/V, change in stools, abd pain GU- denies dysuria, hematuria, dribbling, incontinence MSK- denies joint pain, muscle aches, injury Neuro- denies headache, dizziness, syncope, seizure activity       Objective:    BP 132/78 mmHg  Pulse 64  Temp(Src) 98.4 F (36.9 C) (Oral)  Resp 16  Ht 5\' 8"  (1.727 m)  Wt 197 lb (89.359 kg)  BMI 29.96 kg/m2 GEN- NAD, alert and oriented x3 HEENT- PERRL, EOMI, non injected sclera, pink conjunctiva, MMM, oropharynx clear CVS- RRR, no murmur RESP-CTAB EXT- No edema Pulses- Radial, DP- 2+        Assessment & Plan:      Problem List Items Addressed This Visit    None      Note: This dictation was prepared with Dragon dictation along with smaller phrase technology. Any transcriptional errors that result from this process are unintentional.

## 2013-12-26 NOTE — Assessment & Plan Note (Signed)
Flu shot given Diet and lifestyle controlled Repeat A1C, cholesterol goal LDL , 100

## 2013-12-26 NOTE — Assessment & Plan Note (Signed)
Well controlled, no changes to meds 

## 2013-12-26 NOTE — Assessment & Plan Note (Signed)
Weight gain noted, discussed dietary adherence

## 2013-12-26 NOTE — Patient Instructions (Signed)
Flu shot given Continue current medications F/U 4 months

## 2013-12-26 NOTE — Addendum Note (Signed)
Addended by: Sheral Flow on: 12/26/2013 12:13 PM   Modules accepted: Orders

## 2013-12-27 ENCOUNTER — Encounter: Payer: Self-pay | Admitting: *Deleted

## 2014-01-18 ENCOUNTER — Encounter: Payer: Self-pay | Admitting: Family Medicine

## 2014-02-21 ENCOUNTER — Encounter: Payer: Self-pay | Admitting: Family Medicine

## 2014-04-10 ENCOUNTER — Encounter: Payer: Self-pay | Admitting: Family Medicine

## 2014-05-25 ENCOUNTER — Ambulatory Visit: Payer: Self-pay | Admitting: *Deleted

## 2014-05-25 DIAGNOSIS — Z7901 Long term (current) use of anticoagulants: Secondary | ICD-10-CM

## 2014-09-01 ENCOUNTER — Other Ambulatory Visit: Payer: Self-pay | Admitting: Family Medicine

## 2014-09-01 NOTE — Telephone Encounter (Signed)
Medication refilled per protocol. 

## 2014-11-13 ENCOUNTER — Ambulatory Visit (INDEPENDENT_AMBULATORY_CARE_PROVIDER_SITE_OTHER): Payer: BLUE CROSS/BLUE SHIELD | Admitting: Family Medicine

## 2014-11-13 ENCOUNTER — Encounter: Payer: Self-pay | Admitting: Family Medicine

## 2014-11-13 VITALS — BP 134/66 | HR 76 | Temp 97.7°F | Resp 14 | Ht 68.0 in | Wt 186.0 lb

## 2014-11-13 DIAGNOSIS — Z23 Encounter for immunization: Secondary | ICD-10-CM

## 2014-11-13 DIAGNOSIS — I482 Chronic atrial fibrillation, unspecified: Secondary | ICD-10-CM

## 2014-11-13 DIAGNOSIS — I10 Essential (primary) hypertension: Secondary | ICD-10-CM

## 2014-11-13 DIAGNOSIS — E785 Hyperlipidemia, unspecified: Secondary | ICD-10-CM

## 2014-11-13 DIAGNOSIS — Z125 Encounter for screening for malignant neoplasm of prostate: Secondary | ICD-10-CM

## 2014-11-13 DIAGNOSIS — E119 Type 2 diabetes mellitus without complications: Secondary | ICD-10-CM

## 2014-11-13 DIAGNOSIS — E669 Obesity, unspecified: Secondary | ICD-10-CM | POA: Diagnosis not present

## 2014-11-13 DIAGNOSIS — I429 Cardiomyopathy, unspecified: Secondary | ICD-10-CM

## 2014-11-13 DIAGNOSIS — I4891 Unspecified atrial fibrillation: Secondary | ICD-10-CM

## 2014-11-13 LAB — COMPREHENSIVE METABOLIC PANEL
ALT: 14 U/L (ref 9–46)
AST: 26 U/L (ref 10–35)
Albumin: 3.5 g/dL — ABNORMAL LOW (ref 3.6–5.1)
Alkaline Phosphatase: 122 U/L — ABNORMAL HIGH (ref 40–115)
BUN: 15 mg/dL (ref 7–25)
CHLORIDE: 104 mmol/L (ref 98–110)
CO2: 28 mmol/L (ref 20–31)
Calcium: 8.7 mg/dL (ref 8.6–10.3)
Creat: 1.22 mg/dL (ref 0.70–1.25)
Glucose, Bld: 113 mg/dL — ABNORMAL HIGH (ref 70–99)
POTASSIUM: 4 mmol/L (ref 3.5–5.3)
Sodium: 141 mmol/L (ref 135–146)
TOTAL PROTEIN: 6.3 g/dL (ref 6.1–8.1)
Total Bilirubin: 1.8 mg/dL — ABNORMAL HIGH (ref 0.2–1.2)

## 2014-11-13 LAB — LIPID PANEL
CHOL/HDL RATIO: 2.3 ratio (ref <=5.0)
CHOLESTEROL: 149 mg/dL (ref 125–200)
HDL: 65 mg/dL (ref >=40)
LDL Cholesterol: 65 mg/dL (ref <130)
TRIGLYCERIDES: 97 mg/dL (ref <150)
VLDL: 19 mg/dL (ref <30)

## 2014-11-13 MED ORDER — POTASSIUM CHLORIDE CRYS ER 20 MEQ PO TBCR: 20.0000 meq | EXTENDED_RELEASE_TABLET | Freq: Two times a day (BID) | ORAL | Status: DC

## 2014-11-13 MED ORDER — FUROSEMIDE 80 MG PO TABS: ORAL_TABLET | ORAL | Status: DC

## 2014-11-13 MED ORDER — DIGOXIN 250 MCG PO TABS: 250.0000 ug | ORAL_TABLET | Freq: Every day | ORAL | Status: DC

## 2014-11-13 MED ORDER — LISINOPRIL 5 MG PO TABS: 5.0000 mg | ORAL_TABLET | Freq: Two times a day (BID) | ORAL | Status: DC

## 2014-11-13 MED ORDER — ATORVASTATIN CALCIUM 40 MG PO TABS: 40.0000 mg | ORAL_TABLET | Freq: Every day | ORAL | Status: DC

## 2014-11-13 MED ORDER — METOPROLOL SUCCINATE ER 25 MG PO TB24: 25.0000 mg | ORAL_TABLET | Freq: Every day | ORAL | Status: DC

## 2014-11-13 MED ORDER — EDOXABAN TOSYLATE 60 MG PO TABS: 60.0000 mg | ORAL_TABLET | Freq: Every day | ORAL | Status: DC

## 2014-11-13 NOTE — Assessment & Plan Note (Signed)
Controlled with current regimen  

## 2014-11-13 NOTE — Addendum Note (Signed)
Addended by: Vic Blackbird F on: 11/13/2014 12:36 PM   Modules accepted: Orders

## 2014-11-13 NOTE — Assessment & Plan Note (Signed)
Continue with lasix, no CP, no decompensation

## 2014-11-13 NOTE — Assessment & Plan Note (Addendum)
Diet controlled, recheck A1C, renal function, urine micro Per pt weight loss intentional will continue to monitor, he is very active, and still working, feels well at this weight

## 2014-11-13 NOTE — Patient Instructions (Signed)
Continue current medications We will call with lab results F/U 6 months- PHYSICAL

## 2014-11-13 NOTE — Progress Notes (Signed)
Patient ID: Phillip Wilson, male   DOB: 1945/12/23, 69 y.o.   MRN: [id number]   Subjective:    Patient ID: Phillip Wilson, male    DOB: [birth year], 69 y.o.   MRN: [id number]  Patient presents for 6 month F/U   here in follow-up medications. He has not been seen almost a year. He states he is taking all of his blood pressure medicine as prescribed although his heart medications. He is also not seen his cardiologist since March 2015. He denies any chest pain shortness of breath. He does tell me that he has had quite a few family members pass away including his brother and his cousin who was like a brother to him. He continues to work full time he states that he likes being active and helps him watch his diet. He is intentionally lost 10 pounds at her last visit. He has no new concerns today.    Review Of Systems:  GEN- denies fatigue, fever, +weight loss,weakness, recent illness HEENT- denies eye drainage, change in vision, nasal discharge, CVS- denies chest pain, palpitations RESP- denies SOB, cough, wheeze ABD- denies N/V, change in stools, abd pain GU- denies dysuria, hematuria, dribbling, incontinence MSK- denies joint pain, muscle aches, injury Neuro- denies headache, dizziness, syncope, seizure activity       Objective:    BP 134/66 mmHg  Pulse 76  Temp(Src) 97.7 F (36.5 C) (Oral)  Resp 14  Ht 5\' 8"  (1.727 m)  Wt 186 lb (84.369 kg)  BMI 28.29 kg/m2 GEN- NAD, alert and oriented x3 HEENT- PERRL, EOMI, non injected sclera, pink conjunctiva, MMM, oropharynx clear Neck- Supple, no thyromegaly CVS- irregular rhythem, normal rate  no murmur RESP-CTAB ABD-NABS,soft,NT,ND EXT- No edema Pulses- Radial, DP- 2+, hyperpigmented macule sole of Right foot-nickel size        Assessment & Plan:      Problem List Items Addressed This Visit    Hyperlipidemia   Relevant Orders   Lipid panel   Essential hypertension   Relevant Orders   CBC with Differential/Platelet   Comprehensive metabolic panel   Diabetes mellitus, type 2 (HCC) - Primary   Relevant Orders   Hemoglobin A1c   Microalbumin / creatinine urine ratio   HM DIABETES FOOT EXAM (Completed)   Atrial fibrillation ([place])    Other Visit Diagnoses    Prostate cancer screening        Relevant Orders    PSA, Medicare       Note: This dictation was prepared with Dragon dictation along with smaller phrase technology. Any transcriptional errors that result from this process are unintentional.

## 2014-11-13 NOTE — Assessment & Plan Note (Signed)
Chronic A fib on Sayvasa for anti-coagulation  Rate controlled with digoxin and Toprol

## 2014-11-14 LAB — CBC WITH DIFFERENTIAL/PLATELET
Basophils Absolute: 0.1 10*3/uL (ref 0.0–0.1)
Basophils Relative: 1 % (ref 0–1)
Eosinophils Absolute: 0.1 10*3/uL (ref 0.0–0.7)
Eosinophils Relative: 1 % (ref 0–5)
HCT: 46.9 % (ref 39.0–52.0)
HEMOGLOBIN: 15 g/dL (ref 13.0–17.0)
LYMPHS PCT: 23 % (ref 12–46)
Lymphs Abs: 1.7 10*3/uL (ref 0.7–4.0)
MCH: 28.3 pg (ref 26.0–34.0)
MCHC: 32 g/dL (ref 30.0–36.0)
MCV: 88.5 fL (ref 78.0–100.0)
MONO ABS: 0.6 10*3/uL (ref 0.1–1.0)
Monocytes Relative: 8 % (ref 3–12)
NEUTROS ABS: 5 10*3/uL (ref 1.7–7.7)
NEUTROS PCT: 67 % (ref 43–77)
Platelets: 135 10*3/uL — ABNORMAL LOW (ref 150–400)
RBC: 5.3 MIL/uL (ref 4.22–5.81)
RDW: 16.3 % — ABNORMAL HIGH (ref 11.5–15.5)
WBC: 7.4 10*3/uL (ref 4.0–10.5)

## 2014-11-14 LAB — MICROALBUMIN / CREATININE URINE RATIO
Creatinine, Urine: 171 mg/dL (ref 20–370)
MICROALB UR: 13.3 mg/dL
MICROALB/CREAT RATIO: 78 ug/mg{creat} — AB (ref <30)

## 2014-11-14 LAB — HEMOGLOBIN A1C
Hgb A1c MFr Bld: 6.6 % — ABNORMAL HIGH (ref <5.7)
MEAN PLASMA GLUCOSE: 143 mg/dL — AB (ref <117)

## 2014-11-14 LAB — PSA, MEDICARE: PSA: 1.6 ng/mL (ref <=4.00)

## 2014-11-16 ENCOUNTER — Telehealth: Payer: Self-pay | Admitting: *Deleted

## 2014-11-16 NOTE — Telephone Encounter (Signed)
Received request from pharmacy for [place] on [facility].  PA submitted.   Dx: A-Fib (I48.91)

## 2014-11-17 ENCOUNTER — Encounter: Payer: Self-pay | Admitting: *Deleted

## 2014-11-30 NOTE — Telephone Encounter (Signed)
Received call from patient insurance.   Requested more information in regards to PA.   Requested patient last CrCl. Calculated CrCl as 70.12.  PA approved for 30 day supply through retail and 90 day supply through mail order from 10/31/2014- 11/30/2015.  Case ID# [id number]

## 2014-12-04 ENCOUNTER — Encounter (HOSPITAL_COMMUNITY): Payer: Self-pay | Admitting: Cardiology

## 2014-12-04 ENCOUNTER — Emergency Department (HOSPITAL_COMMUNITY)
Admission: EM | Admit: 2014-12-04 | Discharge: 2014-12-04 | Disposition: A | Discharge status: Home / Self Care | Payer: BLUE CROSS/BLUE SHIELD | Attending: Emergency Medicine | Admitting: Emergency Medicine

## 2014-12-04 ENCOUNTER — Emergency Department (HOSPITAL_COMMUNITY): Payer: BLUE CROSS/BLUE SHIELD

## 2014-12-04 DIAGNOSIS — E785 Hyperlipidemia, unspecified: Secondary | ICD-10-CM | POA: Insufficient documentation

## 2014-12-04 DIAGNOSIS — E119 Type 2 diabetes mellitus without complications: Secondary | ICD-10-CM | POA: Diagnosis not present

## 2014-12-04 DIAGNOSIS — M109 Gout, unspecified: Secondary | ICD-10-CM

## 2014-12-04 DIAGNOSIS — Z87828 Personal history of other (healed) physical injury and trauma: Secondary | ICD-10-CM | POA: Insufficient documentation

## 2014-12-04 DIAGNOSIS — I10 Essential (primary) hypertension: Secondary | ICD-10-CM | POA: Insufficient documentation

## 2014-12-04 DIAGNOSIS — Z7901 Long term (current) use of anticoagulants: Secondary | ICD-10-CM | POA: Diagnosis not present

## 2014-12-04 DIAGNOSIS — I4891 Unspecified atrial fibrillation: Secondary | ICD-10-CM | POA: Diagnosis not present

## 2014-12-04 DIAGNOSIS — Z79899 Other long term (current) drug therapy: Secondary | ICD-10-CM | POA: Diagnosis not present

## 2014-12-04 DIAGNOSIS — M25571 Pain in right ankle and joints of right foot: Secondary | ICD-10-CM | POA: Diagnosis present

## 2014-12-04 DIAGNOSIS — M10071 Idiopathic gout, right ankle and foot: Secondary | ICD-10-CM | POA: Insufficient documentation

## 2014-12-04 LAB — CBC WITH DIFFERENTIAL/PLATELET
Basophils Absolute: 0 10*3/uL (ref 0.0–0.1)
Basophils Relative: 0 %
EOS ABS: 0 10*3/uL (ref 0.0–0.7)
Eosinophils Relative: 0 %
HCT: 46.9 % (ref 39.0–52.0)
HEMOGLOBIN: 15.5 g/dL (ref 13.0–17.0)
LYMPHS ABS: 1.1 10*3/uL (ref 0.7–4.0)
LYMPHS PCT: 10 %
MCH: 28.7 pg (ref 26.0–34.0)
MCHC: 33 g/dL (ref 30.0–36.0)
MCV: 86.7 fL (ref 78.0–100.0)
MONOS PCT: 8 %
Monocytes Absolute: 0.9 10*3/uL (ref 0.1–1.0)
NEUTROS PCT: 81 %
Neutro Abs: 8.6 10*3/uL — ABNORMAL HIGH (ref 1.7–7.7)
Platelets: 104 10*3/uL — ABNORMAL LOW (ref 150–400)
RBC: 5.41 MIL/uL (ref 4.22–5.81)
RDW: 14.8 % (ref 11.5–15.5)
WBC: 10.6 10*3/uL — ABNORMAL HIGH (ref 4.0–10.5)

## 2014-12-04 LAB — URIC ACID: URIC ACID, SERUM: 7.8 mg/dL — AB (ref 4.4–7.6)

## 2014-12-04 MED ORDER — HYDROCODONE-ACETAMINOPHEN 5-325 MG PO TABS: ORAL_TABLET | ORAL | Status: DC

## 2014-12-04 MED ORDER — PREDNISONE 20 MG PO TABS
40.0000 mg | ORAL_TABLET | Freq: Once | ORAL | Status: AC
  Administered 2014-12-04: 40 mg via ORAL
  Filled 2014-12-04: qty 2

## 2014-12-04 MED ORDER — PREDNISONE 20 MG PO TABS: 40.0000 mg | ORAL_TABLET | Freq: Every day | ORAL | Status: DC

## 2014-12-04 NOTE — ED Notes (Signed)
Right foot pain and swelling times 2 days. Denies injury
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
## 2014-12-04 NOTE — Discharge Instructions (Signed)
Gout °Gout is when your joints become red, sore, and swell (inflamed). This is caused by the buildup of uric acid crystals in the joints. Uric acid is a chemical that is normally in the blood. If the level of uric acid gets too high in the blood, these crystals form in your joints and tissues. Over time, these crystals can form into masses near the joints and tissues. These masses can destroy bone and cause the bone to look misshapen (deformed). °HOME CARE  °· Do not take aspirin for pain. °· Only take medicine as told by your doctor. °· Rest the joint as much as you can. When in bed, keep sheets and blankets off painful areas. °· Keep the sore joints raised (elevated). °· Put warm or cold packs on painful joints. Use of warm or cold packs depends on which works best for you. °· Use crutches if the painful joint is in your leg. °· Drink enough fluids to keep your pee (urine) clear or pale yellow. Limit alcohol, sugary drinks, and drinks with fructose in them. °· Follow your diet instructions. Pay careful attention to how much protein you eat. Include fruits, vegetables, whole grains, and fat-free or low-fat milk products in your daily diet. Talk to your doctor or dietitian about the use of coffee, vitamin C, and cherries. These may help lower uric acid levels. °· Keep a healthy body weight. °GET HELP RIGHT AWAY IF:  °· You have watery poop (diarrhea), throw up (vomit), or have any side effects from medicines. °· You do not feel better in 24 hours, or you are getting worse. °· Your joint becomes suddenly more tender, and you have chills or a fever. °MAKE SURE YOU:  °· Understand these instructions. °· Will watch your condition. °· Will get help right away if you are not doing well or get worse. °  °This information is not intended to replace advice given to you by your health care provider. Make sure you discuss any questions you have with your health care provider. °  °Document Released: 10/23/2007 Document Revised:  02/03/2014 Document Reviewed: 08/27/2011 °Elsevier Interactive Patient Education ©2016 [employer] ° °

## 2015-02-22 ENCOUNTER — Ambulatory Visit (INDEPENDENT_AMBULATORY_CARE_PROVIDER_SITE_OTHER): Payer: BLUE CROSS/BLUE SHIELD | Admitting: Adult Health

## 2015-02-22 ENCOUNTER — Encounter: Payer: Self-pay | Admitting: Adult Health

## 2015-02-22 VITALS — BP 132/84 | HR 123 | Ht 68.0 in | Wt 192.0 lb

## 2015-02-22 DIAGNOSIS — R000 Tachycardia, unspecified: Secondary | ICD-10-CM | POA: Diagnosis not present

## 2015-02-22 DIAGNOSIS — Z79899 Other long term (current) drug therapy: Secondary | ICD-10-CM | POA: Diagnosis not present

## 2015-02-22 DIAGNOSIS — I482 Chronic atrial fibrillation: Secondary | ICD-10-CM | POA: Diagnosis not present

## 2015-02-22 DIAGNOSIS — I10 Essential (primary) hypertension: Secondary | ICD-10-CM

## 2015-02-22 DIAGNOSIS — I4821 Permanent atrial fibrillation: Secondary | ICD-10-CM

## 2015-02-22 MED ORDER — METOPROLOL SUCCINATE ER 50 MG PO TB24: 50.0000 mg | ORAL_TABLET | Freq: Every day | ORAL | Status: DC

## 2015-02-22 NOTE — Patient Instructions (Signed)
Your physician recommends that you schedule a follow-up appointment in: 1 month with Arnold Long NP   INCREASE Toprol to 50 mg daily   Your physician recommends that you return for lab work in: NOW, cbc,bmet,digoxin level   Your physician has requested that you have an echocardiogram. Echocardiography is a painless test that uses sound waves to create images of your heart. It provides your doctor with information about the size and shape of your heart and how well your heart's chambers and valves are working. This procedure takes approximately one hour. There are no restrictions for this procedure.      If you need a refill on your cardiac medications before your next appointment, please call your pharmacy.      Thank you for choosing [facility] !
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
## 2015-02-22 NOTE — Progress Notes (Deleted)
Name: Phillip Wilson    DOB: 04/26/1945  Age: 70 y.o.  MR#: [id number]       PCP:  Vic Blackbird, MD      Insurance: Payor: [employer] / Plan: BCBS OTHER / Product Type: *No Product type* /   CC:    Chief Complaint  Patient presents with  . Atrial Fibrillation    VS Filed Vitals:   02/22/15 1327  BP: 132/84  Pulse: 83  Height: 5\' 8"  (1.727 m)  Weight: 192 lb (87.091 kg)  SpO2: 96%    Weights Current Weight  02/22/15 192 lb (87.091 kg)  12/04/14 182 lb (82.555 kg)  11/13/14 186 lb (84.369 kg)    Blood Pressure  BP Readings from Last 3 Encounters:  02/22/15 132/84  12/04/14 138/83  11/13/14 134/66     Admit date:  (Not on file) Last encounter with RMR:  Visit date not found   Allergy Review of patient's allergies indicates no known allergies.  Current Outpatient Prescriptions  Medication Sig Dispense Refill  . atorvastatin (LIPITOR) 40 MG tablet Take 1 tablet (40 mg total) by mouth daily. 90 tablet 2  . digoxin (LANOXIN) 0.25 MG tablet Take 1 tablet (250 mcg total) by mouth daily. 90 tablet 2  . edoxaban (SAVAYSA) 60 MG TABS tablet Take 60 mg by mouth daily. 90 tablet 3  . furosemide (LASIX) 80 MG tablet TAKE ONE TABLET BY MOUTH EVERY MORNING FOR  CONGESTIVE  HEART  FAILURE 90 tablet 2  . lisinopril (PRINIVIL,ZESTRIL) 5 MG tablet Take 1 tablet (5 mg total) by mouth 2 (two) times daily. FOR YOUR HEART AND BLOOD PRESSURE 180 tablet 2  . metoprolol succinate (TOPROL XL) 25 MG 24 hr tablet Take 1 tablet (25 mg total) by mouth daily. For blood presure 90 tablet 2  . potassium chloride SA (KLOR-CON M20) 20 MEQ tablet Take 1 tablet (20 mEq total) by mouth 2 (two) times daily. 180 tablet 1  . HYDROcodone-acetaminophen (NORCO/VICODIN) 5-325 MG tablet Take one tab po q 4-6 hrs prn pain (Patient not taking: Reported on 02/22/2015) 10 tablet 0   No current facility-administered medications for this visit.    Discontinued Meds:    Medications Discontinued During  This Encounter  Medication Reason  . predniSONE (DELTASONE) 20 MG tablet Error    Patient Active Problem List   Diagnosis Date Noted  . Routine general medical examination at a health care facility 05/03/2012  . Shoulder pain 06/02/2011  . Gastroesophageal reflux disease   . Diabetes mellitus, type 2 ([place])   . Thrombocytopenia ([place]) 01/24/2011  . Cardiomyopathy ([place]) 12/28/2010  . Atrial fibrillation ([place]) 12/28/2010  . Vitamin D deficiency 09/03/2010  . Hyperlipidemia 05/05/2006  . Essential hypertension 04/02/2006    LABS    Component Value Date/Time   NA 141 11/13/2014 1209   NA 139 12/26/2013 1125   NA 140 08/26/2013 1217   K 4.0 11/13/2014 1209   K 4.1 12/26/2013 1125   K 3.8 08/26/2013 1217   CL 104 11/13/2014 1209   CL 104 12/26/2013 1125   CL 99 08/26/2013 1217   CO2 28 11/13/2014 1209   CO2 28 12/26/2013 1125   CO2 30 08/26/2013 1217   GLUCOSE 113* 11/13/2014 1209   GLUCOSE 99 12/26/2013 1125   GLUCOSE 111* 08/26/2013 1217   BUN 15 11/13/2014 1209   BUN 9 12/26/2013 1125   BUN 11 08/26/2013 1217   CREATININE 1.22 11/13/2014 1209   CREATININE 0.99 12/26/2013  1125   CREATININE 1.18 08/26/2013 1217   CREATININE 1.13 07/25/2013 2201   CREATININE 1.37* 01/26/2011 0457   CREATININE 1.29 01/25/2011 0455   CALCIUM 8.7 11/13/2014 1209   CALCIUM 8.8 12/26/2013 1125   CALCIUM 9.3 08/26/2013 1217   GFRNONAA 64 08/26/2013 1217   GFRNONAA 66* 07/25/2013 2201   GFRNONAA 53* 01/26/2011 0457   GFRNONAA 57* 01/25/2011 0455   GFRAA 74 08/26/2013 1217   GFRAA 76* 07/25/2013 2201   GFRAA 62* 01/26/2011 0457   GFRAA 67* 01/25/2011 0455   CMP     Component Value Date/Time   NA 141 11/13/2014 1209   K 4.0 11/13/2014 1209   CL 104 11/13/2014 1209   CO2 28 11/13/2014 1209   GLUCOSE 113* 11/13/2014 1209   BUN 15 11/13/2014 1209   CREATININE 1.22 11/13/2014 1209   CREATININE 1.13 07/25/2013 2201   CALCIUM 8.7 11/13/2014 1209   PROT 6.3 11/13/2014 1209   ALBUMIN  3.5* 11/13/2014 1209   AST 26 11/13/2014 1209   ALT 14 11/13/2014 1209   ALKPHOS 122* 11/13/2014 1209   BILITOT 1.8* 11/13/2014 1209   GFRNONAA 64 08/26/2013 1217   GFRNONAA 66* 07/25/2013 2201   GFRAA 74 08/26/2013 1217   GFRAA 76* 07/25/2013 2201       Component Value Date/Time   WBC 10.6* 12/04/2014 0906   WBC 7.4 11/13/2014 1209   WBC 5.9 12/26/2013 1125   HGB 15.5 12/04/2014 0906   HGB 15.0 11/13/2014 1209   HGB 13.7 12/26/2013 1125   HCT 46.9 12/04/2014 0906   HCT 46.9 11/13/2014 1209   HCT 40.4 12/26/2013 1125   MCV 86.7 12/04/2014 0906   MCV 88.5 11/13/2014 1209   MCV 81.3 12/26/2013 1125    Lipid Panel     Component Value Date/Time   CHOL 149 11/13/2014 1209   TRIG 97 11/13/2014 1209   HDL 65 11/13/2014 1209   CHOLHDL 2.3 11/13/2014 1209   VLDL 19 11/13/2014 1209   LDLCALC 65 11/13/2014 1209   LDLDIRECT 57 01/08/2012 1626    ABG No results found for: PHART, PCO2ART, PO2ART, HCO3, TCO2, ACIDBASEDEF, O2SAT   Lab Results  Component Value Date   TSH 1.425 01/22/2011   BNP (last 3 results) No results for input(s): BNP in the last 8760 hours.  ProBNP (last 3 results) No results for input(s): PROBNP in the last 8760 hours.  Cardiac Panel (last 3 results) No results for input(s): CKTOTAL, CKMB, TROPONINI, RELINDX in the last 72 hours.  Iron/TIBC/Ferritin/ %Sat No results found for: IRON, TIBC, FERRITIN, IRONPCTSAT   EKG Orders placed or performed in visit on 02/22/15  . EKG 12-Lead     Prior Assessment and Plan Problem List as of 02/22/2015      Cardiovascular and Mediastinum   Essential hypertension   Last Assessment & Plan 11/13/2014 Office Visit Written 11/13/2014 12:19 PM by Alycia Rossetti, MD    Controlled with current regimen      Cardiomyopathy [facility])   Last Assessment & Plan 11/13/2014 Office Visit Written 11/13/2014 12:20 PM by Alycia Rossetti, MD    Continue with lasix, no CP, no decompensation      Atrial fibrillation [facility])    Last Assessment & Plan 11/13/2014 Office Visit Written 11/13/2014 12:18 PM by Alycia Rossetti, MD    Chronic A fib on [phone] for anti-coagulation  Rate controlled with digoxin and Toprol        Digestive   Gastroesophageal reflux disease  Endocrine   Diabetes mellitus, type 2 [facility])   Last Assessment & Plan 11/13/2014 Office Visit Edited 11/13/2014 12:19 PM by Alycia Rossetti, MD    Diet controlled, recheck A1C, renal function, urine micro Per pt weight loss intentional will continue to monitor, he is very active, and still working, feels well at this weight        Other   Hyperlipidemia   Last Assessment & Plan 04/11/2013 Office Visit Written 04/11/2013  3:51 PM by Alycia Rossetti, MD    Check non fasting labs, he does not have lipitor with him, I am not sure what he is taking      Vitamin D deficiency   Last Assessment & Plan 09/17/2010 Office Visit Written 09/17/2010  5:14 PM by Alycia Rossetti, MD    His vitamin D levels are normal. He will pick up of vitamin with calcium and vitamin D      Thrombocytopenia ([place])   Shoulder pain   Last Assessment & Plan 06/02/2011 Office Visit Written 06/02/2011  8:39 PM by Alycia Rossetti, MD    With his cardiomyopathy and CHF that was recent, will hold on oral meds and try topical NSAID if pt can get this covered by insurance. If no improvement then will send to ortho for evaluation      Routine general medical examination at a health care facility   Last Assessment & Plan 05/03/2012 Office Visit Written 05/03/2012  3:40 PM by Alycia Rossetti, MD    Doing well, shots UTD Colonoscopy UTD Fasting labs          Imaging: No results found.

## 2015-02-24 LAB — BASIC METABOLIC PANEL
BUN: 27 mg/dL — ABNORMAL HIGH (ref 7–25)
CO2: 31 mmol/L (ref 20–31)
Calcium: 8.8 mg/dL (ref 8.6–10.3)
Chloride: 98 mmol/L (ref 98–110)
Creat: 1.48 mg/dL — ABNORMAL HIGH (ref 0.70–1.25)
GLUCOSE: 148 mg/dL — AB (ref 65–99)
Potassium: 3.7 mmol/L (ref 3.5–5.3)
SODIUM: 140 mmol/L (ref 135–146)

## 2015-02-24 LAB — CBC
HCT: 48.8 % (ref 39.0–52.0)
HEMOGLOBIN: 15.9 g/dL (ref 13.0–17.0)
MCH: 29.3 pg (ref 26.0–34.0)
MCHC: 32.6 g/dL (ref 30.0–36.0)
MCV: 90 fL (ref 78.0–100.0)
Platelets: 144 10*3/uL — ABNORMAL LOW (ref 150–400)
RBC: 5.42 MIL/uL (ref 4.22–5.81)
RDW: 15.9 % — ABNORMAL HIGH (ref 11.5–15.5)
WBC: 7.2 10*3/uL (ref 4.0–10.5)

## 2015-02-24 LAB — DIGOXIN LEVEL: DIGOXIN LVL: 1.1 ug/L (ref 0.8–2.0)

## 2015-02-26 ENCOUNTER — Telehealth: Payer: Self-pay | Admitting: *Deleted

## 2015-02-26 NOTE — Telephone Encounter (Signed)
-----   Message from Lendon Colonel, NP sent at 02/26/2015  6:51 AM EST ----- Labs essentially normal. Creatinine slightly elevated.He was given zaroxolyn X 1. Please repeat BMET before follow up appointment

## 2015-02-26 NOTE — Telephone Encounter (Signed)
Called patient with test results. No answer. Unable to leave message.  

## 2015-02-27 ENCOUNTER — Ambulatory Visit (HOSPITAL_COMMUNITY)
Admission: RE | Admit: 2015-02-27 | Discharge: 2015-02-27 | Disposition: A | Discharge status: Home / Self Care | Payer: BLUE CROSS/BLUE SHIELD | Source: Ambulatory Visit | Attending: Adult Health | Admitting: Adult Health

## 2015-02-27 ENCOUNTER — Telehealth: Payer: Self-pay | Admitting: *Deleted

## 2015-02-27 DIAGNOSIS — I071 Rheumatic tricuspid insufficiency: Secondary | ICD-10-CM | POA: Insufficient documentation

## 2015-02-27 DIAGNOSIS — I482 Chronic atrial fibrillation: Secondary | ICD-10-CM

## 2015-02-27 DIAGNOSIS — I4821 Permanent atrial fibrillation: Secondary | ICD-10-CM

## 2015-02-27 DIAGNOSIS — I10 Essential (primary) hypertension: Secondary | ICD-10-CM | POA: Insufficient documentation

## 2015-02-27 DIAGNOSIS — R000 Tachycardia, unspecified: Secondary | ICD-10-CM | POA: Diagnosis not present

## 2015-02-27 DIAGNOSIS — I313 Pericardial effusion (noninflammatory): Secondary | ICD-10-CM | POA: Insufficient documentation

## 2015-02-27 DIAGNOSIS — I517 Cardiomegaly: Secondary | ICD-10-CM | POA: Diagnosis not present

## 2015-02-27 DIAGNOSIS — I340 Nonrheumatic mitral (valve) insufficiency: Secondary | ICD-10-CM | POA: Insufficient documentation

## 2015-02-27 DIAGNOSIS — E119 Type 2 diabetes mellitus without complications: Secondary | ICD-10-CM | POA: Diagnosis not present

## 2015-02-27 DIAGNOSIS — E785 Hyperlipidemia, unspecified: Secondary | ICD-10-CM | POA: Insufficient documentation

## 2015-02-27 DIAGNOSIS — I77810 Thoracic aortic ectasia: Secondary | ICD-10-CM | POA: Diagnosis not present

## 2015-02-27 NOTE — Telephone Encounter (Signed)
-----   Message from Lendon Colonel, NP sent at 02/27/2015  4:21 PM EST ----- Significantly low EF. Will continue current regimen. I will discuss on follow up appointment.

## 2015-02-27 NOTE — Telephone Encounter (Signed)
Called patient with test results. No answer. Unable to leave message.  

## 2015-02-28 ENCOUNTER — Telehealth: Payer: Self-pay | Admitting: *Deleted

## 2015-02-28 DIAGNOSIS — Z79899 Other long term (current) drug therapy: Secondary | ICD-10-CM

## 2015-02-28 NOTE — Telephone Encounter (Signed)
-----   Message from Lendon Colonel, NP sent at 02/26/2015  6:51 AM EST ----- Labs essentially normal. Creatinine slightly elevated.He was given zaroxolyn X 1. Please repeat BMET before follow up appointment

## 2015-03-24 LAB — BASIC METABOLIC PANEL
BUN: 22 mg/dL (ref 7–25)
CALCIUM: 8.3 mg/dL — AB (ref 8.6–10.3)
CO2: 29 mmol/L (ref 20–31)
CREATININE: 1.21 mg/dL (ref 0.70–1.25)
Chloride: 100 mmol/L (ref 98–110)
Glucose, Bld: 114 mg/dL — ABNORMAL HIGH (ref 65–99)
Potassium: 4.2 mmol/L (ref 3.5–5.3)
Sodium: 138 mmol/L (ref 135–146)

## 2015-03-26 ENCOUNTER — Encounter: Payer: BLUE CROSS/BLUE SHIELD | Admitting: Adult Health

## 2015-03-26 ENCOUNTER — Telehealth: Payer: Self-pay | Admitting: *Deleted

## 2015-03-26 NOTE — Progress Notes (Signed)
Cardiology Office Note   Date:  03/26/2015   Cancelled Appointment

## 2015-03-26 NOTE — Telephone Encounter (Signed)
Called patient with test results. No answer. Unable to leave message.  

## 2015-03-26 NOTE — Telephone Encounter (Signed)
-----   Message from Lendon Colonel, NP sent at 03/26/2015  6:48 AM EST ----- Glucose slightly elevated. No changes in regimen. Send to PCP

## 2015-03-30 ENCOUNTER — Ambulatory Visit (INDEPENDENT_AMBULATORY_CARE_PROVIDER_SITE_OTHER): Payer: BLUE CROSS/BLUE SHIELD | Admitting: Adult Health

## 2015-03-30 ENCOUNTER — Encounter: Payer: Self-pay | Admitting: Adult Health

## 2015-03-30 VITALS — BP 148/88 | HR 59 | Ht 69.0 in | Wt 198.0 lb

## 2015-03-30 DIAGNOSIS — I5022 Chronic systolic (congestive) heart failure: Secondary | ICD-10-CM | POA: Diagnosis not present
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
## 2015-03-30 NOTE — Progress Notes (Deleted)
Name: Phillip Wilson    DOB: August 15, 1945  Age: 70 y.o.  MR#: [id number]       PCP:  Vic Blackbird, MD      Insurance: Payor: [employer] [place] / Plan: BCBS OTHER / Product Type: *No Product type* /   CC:   No chief complaint on file.   VS Filed Vitals:   03/30/15 1439  BP: 148/88  Pulse: 59  Height: 5\' 9"  (1.753 m)  Weight: 198 lb (89.812 kg)  SpO2: 99%    Weights Current Weight  03/30/15 198 lb (89.812 kg)  02/22/15 192 lb (87.091 kg)  12/04/14 182 lb (82.555 kg)    Blood Pressure  BP Readings from Last 3 Encounters:  03/30/15 148/88  02/22/15 132/84  12/04/14 138/83     Admit date:  (Not on file) Last encounter with [facility]:  02/22/2015   Allergy Review of patient's allergies indicates no known allergies.  Current Outpatient Prescriptions  Medication Sig Dispense Refill  . atorvastatin (LIPITOR) 40 MG tablet Take 1 tablet (40 mg total) by mouth daily. 90 tablet 2  . digoxin (LANOXIN) 0.25 MG tablet Take 1 tablet (250 mcg total) by mouth daily. 90 tablet 2  . edoxaban (SAVAYSA) 60 MG TABS tablet Take 60 mg by mouth daily. 90 tablet 3  . furosemide (LASIX) 80 MG tablet TAKE ONE TABLET BY MOUTH EVERY MORNING FOR  CONGESTIVE  HEART  FAILURE 90 tablet 2  . HYDROcodone-acetaminophen (NORCO/VICODIN) 5-325 MG tablet Take one tab po q 4-6 hrs prn pain 10 tablet 0  . lisinopril (PRINIVIL,ZESTRIL) 5 MG tablet Take 1 tablet (5 mg total) by mouth 2 (two) times daily. FOR YOUR HEART AND BLOOD PRESSURE 180 tablet 2  . metoprolol succinate (TOPROL-XL) 50 MG 24 hr tablet Take 1 tablet (50 mg total) by mouth daily. Take with or immediately following a meal. 90 tablet 3  . potassium chloride SA (KLOR-CON M20) 20 MEQ tablet Take 1 tablet (20 mEq total) by mouth 2 (two) times daily. 180 tablet 1   No current facility-administered medications for this visit.    Discontinued Meds:   There are no discontinued medications.  Patient Active Problem List   Diagnosis Date Noted  . Routine  general medical examination at a health care facility 05/03/2012  . Shoulder pain 06/02/2011  . Gastroesophageal reflux disease   . Diabetes mellitus, type 2 ([place])   . Thrombocytopenia ([place]) 01/24/2011  . Cardiomyopathy ([place]) 12/28/2010  . Atrial fibrillation ([place]) 12/28/2010  . Vitamin D deficiency 09/03/2010  . Hyperlipidemia 05/05/2006  . Essential hypertension 04/02/2006    LABS    Component Value Date/Time   NA 138 03/23/2015 1119   NA 140 02/22/2015 0805   NA 141 11/13/2014 1209   K 4.2 03/23/2015 1119   K 3.7 02/22/2015 0805   K 4.0 11/13/2014 1209   CL 100 03/23/2015 1119   CL 98 02/22/2015 0805   CL 104 11/13/2014 1209   CO2 29 03/23/2015 1119   CO2 31 02/22/2015 0805   CO2 28 11/13/2014 1209   GLUCOSE 114* 03/23/2015 1119   GLUCOSE 148* 02/22/2015 0805   GLUCOSE 113* 11/13/2014 1209   BUN 22 03/23/2015 1119   BUN 27* 02/22/2015 0805   BUN 15 11/13/2014 1209   CREATININE 1.21 03/23/2015 1119   CREATININE 1.48* 02/22/2015 0805   CREATININE 1.22 11/13/2014 1209   CREATININE 1.13 07/25/2013 2201   CREATININE 1.37* 01/26/2011 0457   CREATININE 1.29 01/25/2011 0455  CALCIUM 8.3* 03/23/2015 1119   CALCIUM 8.8 02/22/2015 0805   CALCIUM 8.7 11/13/2014 1209   GFRNONAA 64 08/26/2013 1217   GFRNONAA 66* 07/25/2013 2201   GFRNONAA 53* 01/26/2011 0457   GFRNONAA 57* 01/25/2011 0455   GFRAA 74 08/26/2013 1217   GFRAA 76* 07/25/2013 2201   GFRAA 62* 01/26/2011 0457   GFRAA 67* 01/25/2011 0455   CMP     Component Value Date/Time   NA 138 03/23/2015 1119   K 4.2 03/23/2015 1119   CL 100 03/23/2015 1119   CO2 29 03/23/2015 1119   GLUCOSE 114* 03/23/2015 1119   BUN 22 03/23/2015 1119   CREATININE 1.21 03/23/2015 1119   CREATININE 1.13 07/25/2013 2201   CALCIUM 8.3* 03/23/2015 1119   PROT 6.3 11/13/2014 1209   ALBUMIN 3.5* 11/13/2014 1209   AST 26 11/13/2014 1209   ALT 14 11/13/2014 1209   ALKPHOS 122* 11/13/2014 1209   BILITOT 1.8* 11/13/2014 1209    GFRNONAA 64 08/26/2013 1217   GFRNONAA 66* 07/25/2013 2201   GFRAA 74 08/26/2013 1217   GFRAA 76* 07/25/2013 2201       Component Value Date/Time   WBC 7.2 02/22/2015 0805   WBC 10.6* 12/04/2014 0906   WBC 7.4 11/13/2014 1209   HGB 15.9 02/22/2015 0805   HGB 15.5 12/04/2014 0906   HGB 15.0 11/13/2014 1209   HCT 48.8 02/22/2015 0805   HCT 46.9 12/04/2014 0906   HCT 46.9 11/13/2014 1209   MCV 90.0 02/22/2015 0805   MCV 86.7 12/04/2014 0906   MCV 88.5 11/13/2014 1209    Lipid Panel     Component Value Date/Time   CHOL 149 11/13/2014 1209   TRIG 97 11/13/2014 1209   HDL 65 11/13/2014 1209   CHOLHDL 2.3 11/13/2014 1209   VLDL 19 11/13/2014 1209   LDLCALC 65 11/13/2014 1209   LDLDIRECT 57 01/08/2012 1626    ABG No results found for: PHART, PCO2ART, PO2ART, HCO3, TCO2, ACIDBASEDEF, O2SAT   Lab Results  Component Value Date   TSH 1.425 01/22/2011   BNP (last 3 results) No results for input(s): BNP in the last 8760 hours.  ProBNP (last 3 results) No results for input(s): PROBNP in the last 8760 hours.  Cardiac Panel (last 3 results) No results for input(s): CKTOTAL, CKMB, TROPONINI, RELINDX in the last 72 hours.  Iron/TIBC/Ferritin/ %Sat No results found for: IRON, TIBC, FERRITIN, IRONPCTSAT   EKG Orders placed or performed in visit on 02/22/15  . EKG 12-Lead     Prior Assessment and Plan Problem List as of 03/30/2015      Cardiovascular and Mediastinum   Essential hypertension   Last Assessment & Plan 11/13/2014 Office Visit Written 11/13/2014 12:19 PM by Alycia Rossetti, MD    Controlled with current regimen      Cardiomyopathy [facility])   Last Assessment & Plan 11/13/2014 Office Visit Written 11/13/2014 12:20 PM by Alycia Rossetti, MD    Continue with lasix, no CP, no decompensation      Atrial fibrillation [facility])   Last Assessment & Plan 11/13/2014 Office Visit Written 11/13/2014 12:18 PM by Alycia Rossetti, MD    Chronic A fib on [id number] for  anti-coagulation  Rate controlled with digoxin and Toprol        Digestive   Gastroesophageal reflux disease     Endocrine   Diabetes mellitus, type 2 [facility])   Last Assessment & Plan 11/13/2014 Office Visit Edited 11/13/2014 12:19 PM by Alycia Rossetti, MD  Diet controlled, recheck A1C, renal function, urine micro Per pt weight loss intentional will continue to monitor, he is very active, and still working, feels well at this weight        Other   Hyperlipidemia   Last Assessment & Plan 04/11/2013 Office Visit Written 04/11/2013  3:51 PM by Alycia Rossetti, MD    Check non fasting labs, he does not have lipitor with him, I am not sure what he is taking      Vitamin D deficiency   Last Assessment & Plan 09/17/2010 Office Visit Written 09/17/2010  5:14 PM by Alycia Rossetti, MD    His vitamin D levels are normal. He will pick up of vitamin with calcium and vitamin D      Thrombocytopenia ([place])   Shoulder pain   Last Assessment & Plan 06/02/2011 Office Visit Written 06/02/2011  8:39 PM by Alycia Rossetti, MD    With his cardiomyopathy and CHF that was recent, will hold on oral meds and try topical NSAID if pt can get this covered by insurance. If no improvement then will send to ortho for evaluation      Routine general medical examination at a health care facility   Last Assessment & Plan 05/03/2012 Office Visit Written 05/03/2012  3:40 PM by Alycia Rossetti, MD    Doing well, shots UTD Colonoscopy UTD Fasting labs          Imaging: No results found.

## 2015-03-30 NOTE — Patient Instructions (Signed)
Your physician recommends that you schedule a follow-up appointment in: 3 months with K lawrence NP  Take extra dose of lasix and potassium today   Get echocardiogram in 2 months   Weigh self daily,if you gain more than 2 lbs overnight,take extra lasix and potassium dose that day   Thank you for choosing [facility] !

## 2015-04-03 ENCOUNTER — Encounter: Payer: Self-pay | Admitting: *Deleted

## 2015-05-14 ENCOUNTER — Encounter: Payer: Medicare Other | Admitting: Family Medicine

## 2015-05-22 ENCOUNTER — Encounter: Payer: Self-pay | Admitting: Family Medicine

## 2015-05-30 ENCOUNTER — Ambulatory Visit (HOSPITAL_COMMUNITY): Payer: BLUE CROSS/BLUE SHIELD | Attending: Adult Health

## 2015-06-28 ENCOUNTER — Encounter: Payer: BLUE CROSS/BLUE SHIELD | Admitting: Adult Health

## 2015-06-28 NOTE — Progress Notes (Signed)
Cardiology Office Note   Cancelled

## 2015-07-01 NOTE — Progress Notes (Signed)
Cardiology Office Note   ERROR  

## 2015-07-02 ENCOUNTER — Encounter: Payer: Medicare Other | Admitting: Adult Health

## 2015-07-06 ENCOUNTER — Ambulatory Visit (HOSPITAL_COMMUNITY)
Admission: RE | Admit: 2015-07-06 | Discharge: 2015-07-06 | Disposition: A | Discharge status: Home / Self Care | Payer: BLUE CROSS/BLUE SHIELD | Source: Ambulatory Visit | Attending: Adult Health | Admitting: Adult Health

## 2015-07-06 DIAGNOSIS — E785 Hyperlipidemia, unspecified: Secondary | ICD-10-CM | POA: Diagnosis not present

## 2015-07-06 DIAGNOSIS — E119 Type 2 diabetes mellitus without complications: Secondary | ICD-10-CM | POA: Diagnosis not present

## 2015-07-06 DIAGNOSIS — I071 Rheumatic tricuspid insufficiency: Secondary | ICD-10-CM | POA: Insufficient documentation

## 2015-07-06 DIAGNOSIS — I371 Nonrheumatic pulmonary valve insufficiency: Secondary | ICD-10-CM | POA: Diagnosis not present

## 2015-07-06 DIAGNOSIS — I119 Hypertensive heart disease without heart failure: Secondary | ICD-10-CM | POA: Insufficient documentation

## 2015-07-06 DIAGNOSIS — I77810 Thoracic aortic ectasia: Secondary | ICD-10-CM | POA: Diagnosis not present

## 2015-07-06 DIAGNOSIS — I340 Nonrheumatic mitral (valve) insufficiency: Secondary | ICD-10-CM | POA: Diagnosis not present

## 2015-07-06 DIAGNOSIS — I5022 Chronic systolic (congestive) heart failure: Secondary | ICD-10-CM | POA: Diagnosis not present

## 2015-07-06 DIAGNOSIS — I313 Pericardial effusion (noninflammatory): Secondary | ICD-10-CM | POA: Insufficient documentation

## 2015-07-06 DIAGNOSIS — I509 Heart failure, unspecified: Secondary | ICD-10-CM | POA: Diagnosis present

## 2015-07-06 LAB — ECHOCARDIOGRAM COMPLETE
CHL CUP MV DEC (S): 190
CHL CUP TV REG PEAK VELOCITY: 299 cm/s
E decel time: 190 msec
EERAT: 10.89
FS: 12 % — AB (ref 28–44)
IVS/LV PW RATIO, ED: 0.96
LA diam index: 2.43 cm/m2
LA vol A4C: 93.1 ml
LASIZE: 50 mm
LDCA: 3.14 cm2
LEFT ATRIUM END SYS DIAM: 50 mm
LV E/e' medial: 10.89
LV E/e'average: 10.89
LV PW d: 11.3 mm — AB (ref 0.6–1.1)
LV SIMPSON'S DISK: 33
LV TDI E'MEDIAL: 3.71
LV dias vol: 151 mL — AB (ref 62–150)
LV sys vol index: 50 mL/m2
LV sys vol: 102 mL — AB (ref 21–61)
LVDIAVOLIN: 73 mL/m2
LVELAT: 7.83 cm/s
LVOTD: 20 mm
MV pk A vel: 41.1 m/s
MVPG: 3 mmHg
MVPKEVEL: 85.3 m/s
Stroke v: 49 ml
TAPSE: 13.8 mm
TDI e' lateral: 7.83
TR max vel: 299 cm/s
VTI: 118 cm

## 2015-07-09 ENCOUNTER — Ambulatory Visit (INDEPENDENT_AMBULATORY_CARE_PROVIDER_SITE_OTHER): Payer: BLUE CROSS/BLUE SHIELD | Admitting: Adult Health

## 2015-07-09 ENCOUNTER — Encounter: Payer: Self-pay | Admitting: Adult Health

## 2015-07-09 VITALS — BP 110/58 | HR 72 | Ht 68.0 in | Wt 182.0 lb

## 2015-07-09 DIAGNOSIS — I482 Chronic atrial fibrillation, unspecified: Secondary | ICD-10-CM

## 2015-07-09 DIAGNOSIS — I10 Essential (primary) hypertension: Secondary | ICD-10-CM

## 2015-07-09 DIAGNOSIS — I420 Dilated cardiomyopathy: Secondary | ICD-10-CM
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
## 2015-07-09 NOTE — Progress Notes (Deleted)
Name: Phillip Wilson    DOB: 11/21/1945  Age: 70 y.o.  MR#: [id number]       PCP:  Vic Blackbird, MD      Insurance: Payor: BLUE [place] / Plan: BCBS OTHER / Product Type: *No Product type* /   CC:   No chief complaint on file.   VS Filed Vitals:   07/09/15 1543  BP: 110/58  Pulse: 72  Height: 5\' 8"  (1.727 m)  Weight: 182 lb (82.555 kg)  SpO2: 98%    Weights Current Weight  07/09/15 182 lb (82.555 kg)  03/30/15 198 lb (89.812 kg)  02/22/15 192 lb (87.091 kg)    Blood Pressure  BP Readings from Last 3 Encounters:  07/09/15 110/58  03/30/15 148/88  02/22/15 132/84     Admit date:  (Not on file) Last encounter with RMR:  03/30/2015   Allergy Review of patient's allergies indicates no known allergies.  Current Outpatient Prescriptions  Medication Sig Dispense Refill  . atorvastatin (LIPITOR) 40 MG tablet Take 1 tablet (40 mg total) by mouth daily. 90 tablet 2  . digoxin (LANOXIN) 0.25 MG tablet Take 1 tablet (250 mcg total) by mouth daily. 90 tablet 2  . edoxaban (SAVAYSA) 60 MG TABS tablet Take 60 mg by mouth daily. 90 tablet 3  . furosemide (LASIX) 80 MG tablet TAKE ONE TABLET BY MOUTH EVERY MORNING FOR  CONGESTIVE  HEART  FAILURE 90 tablet 2  . HYDROcodone-acetaminophen (NORCO/VICODIN) 5-325 MG tablet Take one tab po q 4-6 hrs prn pain 10 tablet 0  . lisinopril (PRINIVIL,ZESTRIL) 5 MG tablet Take 1 tablet (5 mg total) by mouth 2 (two) times daily. FOR YOUR HEART AND BLOOD PRESSURE 180 tablet 2  . metoprolol succinate (TOPROL-XL) 50 MG 24 hr tablet Take 1 tablet (50 mg total) by mouth daily. Take with or immediately following a meal. 90 tablet 3  . potassium chloride SA (KLOR-CON M20) 20 MEQ tablet Take 1 tablet (20 mEq total) by mouth 2 (two) times daily. 180 tablet 1   No current facility-administered medications for this visit.    Discontinued Meds:   There are no discontinued medications.  Patient Active Problem List   Diagnosis Date Noted  . Routine  general medical examination at a health care facility 05/03/2012  . Shoulder pain 06/02/2011  . Gastroesophageal reflux disease   . Diabetes mellitus, type 2 ([place])   . Thrombocytopenia ([place]) 01/24/2011  . Cardiomyopathy ([place]) 12/28/2010  . Atrial fibrillation ([place]) 12/28/2010  . Vitamin D deficiency 09/03/2010  . Hyperlipidemia 05/05/2006  . Essential hypertension 04/02/2006    LABS    Component Value Date/Time   NA 138 03/23/2015 1119   NA 140 02/22/2015 0805   NA 141 11/13/2014 1209   K 4.2 03/23/2015 1119   K 3.7 02/22/2015 0805   K 4.0 11/13/2014 1209   CL 100 03/23/2015 1119   CL 98 02/22/2015 0805   CL 104 11/13/2014 1209   CO2 29 03/23/2015 1119   CO2 31 02/22/2015 0805   CO2 28 11/13/2014 1209   GLUCOSE 114* 03/23/2015 1119   GLUCOSE 148* 02/22/2015 0805   GLUCOSE 113* 11/13/2014 1209   BUN 22 03/23/2015 1119   BUN 27* 02/22/2015 0805   BUN 15 11/13/2014 1209   CREATININE 1.21 03/23/2015 1119   CREATININE 1.48* 02/22/2015 0805   CREATININE 1.22 11/13/2014 1209   CREATININE 1.13 07/25/2013 2201   CREATININE 1.37* 01/26/2011 0457   CREATININE 1.29 01/25/2011 0455  CALCIUM 8.3* 03/23/2015 1119   CALCIUM 8.8 02/22/2015 0805   CALCIUM 8.7 11/13/2014 1209   GFRNONAA 64 08/26/2013 1217   GFRNONAA 66* 07/25/2013 2201   GFRNONAA 53* 01/26/2011 0457   GFRNONAA 57* 01/25/2011 0455   GFRAA 74 08/26/2013 1217   GFRAA 76* 07/25/2013 2201   GFRAA 62* 01/26/2011 0457   GFRAA 67* 01/25/2011 0455   CMP     Component Value Date/Time   NA 138 03/23/2015 1119   K 4.2 03/23/2015 1119   CL 100 03/23/2015 1119   CO2 29 03/23/2015 1119   GLUCOSE 114* 03/23/2015 1119   BUN 22 03/23/2015 1119   CREATININE 1.21 03/23/2015 1119   CREATININE 1.13 07/25/2013 2201   CALCIUM 8.3* 03/23/2015 1119   PROT 6.3 11/13/2014 1209   ALBUMIN 3.5* 11/13/2014 1209   AST 26 11/13/2014 1209   ALT 14 11/13/2014 1209   ALKPHOS 122* 11/13/2014 1209   BILITOT 1.8* 11/13/2014 1209    GFRNONAA 64 08/26/2013 1217   GFRNONAA 66* 07/25/2013 2201   GFRAA 74 08/26/2013 1217   GFRAA 76* 07/25/2013 2201       Component Value Date/Time   WBC 7.2 02/22/2015 0805   WBC 10.6* 12/04/2014 0906   WBC 7.4 11/13/2014 1209   HGB 15.9 02/22/2015 0805   HGB 15.5 12/04/2014 0906   HGB 15.0 11/13/2014 1209   HCT 48.8 02/22/2015 0805   HCT 46.9 12/04/2014 0906   HCT 46.9 11/13/2014 1209   MCV 90.0 02/22/2015 0805   MCV 86.7 12/04/2014 0906   MCV 88.5 11/13/2014 1209    Lipid Panel     Component Value Date/Time   CHOL 149 11/13/2014 1209   TRIG 97 11/13/2014 1209   HDL 65 11/13/2014 1209   CHOLHDL 2.3 11/13/2014 1209   VLDL 19 11/13/2014 1209   LDLCALC 65 11/13/2014 1209   LDLDIRECT 57 01/08/2012 1626    ABG No results found for: PHART, PCO2ART, PO2ART, HCO3, TCO2, ACIDBASEDEF, O2SAT   Lab Results  Component Value Date   TSH 1.425 01/22/2011   BNP (last 3 results) No results for input(s): BNP in the last 8760 hours.  ProBNP (last 3 results) No results for input(s): PROBNP in the last 8760 hours.  Cardiac Panel (last 3 results) No results for input(s): CKTOTAL, CKMB, TROPONINI, RELINDX in the last 72 hours.  Iron/TIBC/Ferritin/ %Sat No results found for: IRON, TIBC, FERRITIN, IRONPCTSAT   EKG Orders placed or performed in visit on 02/22/15  . EKG 12-Lead     Prior Assessment and Plan Problem List as of 07/09/2015      Cardiovascular and Mediastinum   Essential hypertension   Last Assessment & Plan 11/13/2014 Office Visit Written 11/13/2014 12:19 PM by Alycia Rossetti, MD    Controlled with current regimen      Cardiomyopathy [facility])   Last Assessment & Plan 11/13/2014 Office Visit Written 11/13/2014 12:20 PM by Alycia Rossetti, MD    Continue with lasix, no CP, no decompensation      Atrial fibrillation [facility])   Last Assessment & Plan 11/13/2014 Office Visit Written 11/13/2014 12:18 PM by Alycia Rossetti, MD    Chronic A fib on [phone] for  anti-coagulation  Rate controlled with digoxin and Toprol        Digestive   Gastroesophageal reflux disease     Endocrine   Diabetes mellitus, type 2 [facility]   Last Assessment & Plan 11/13/2014 Office Visit Edited 11/13/2014 12:19 PM by Alycia Rossetti, MD  Diet controlled, recheck A1C, renal function, urine micro Per pt weight loss intentional will continue to monitor, he is very active, and still working, feels well at this weight        Other   Hyperlipidemia   Last Assessment & Plan 04/11/2013 Office Visit Written 04/11/2013  3:51 PM by Alycia Rossetti, MD    Check non fasting labs, he does not have lipitor with him, I am not sure what he is taking      Vitamin D deficiency   Last Assessment & Plan 09/17/2010 Office Visit Written 09/17/2010  5:14 PM by Alycia Rossetti, MD    His vitamin D levels are normal. He will pick up of vitamin with calcium and vitamin D      Thrombocytopenia ([place])   Shoulder pain   Last Assessment & Plan 06/02/2011 Office Visit Written 06/02/2011  8:39 PM by Alycia Rossetti, MD    With his cardiomyopathy and CHF that was recent, will hold on oral meds and try topical NSAID if pt can get this covered by insurance. If no improvement then will send to ortho for evaluation      Routine general medical examination at a health care facility   Last Assessment & Plan 05/03/2012 Office Visit Written 05/03/2012  3:40 PM by Alycia Rossetti, MD    Doing well, shots UTD Colonoscopy UTD Fasting labs          Imaging: No results found.

## 2015-07-09 NOTE — Patient Instructions (Signed)
Medication Instructions:  Your physician recommends that you continue on your current medications as directed. Please refer to the Current Medication list given to you today.   Labwork: none  Testing/Procedures: none  Follow-Up: Your physician recommends that you schedule a follow-up appointment in: 2 months with Dr. Bronson Ing    Any Other Special Instructions Will Be Listed Below (If Applicable).     If you need a refill on your cardiac medications before your next appointment, please call your pharmacy.

## 2015-09-10 ENCOUNTER — Ambulatory Visit: Payer: Medicare Other | Admitting: Cardiovascular Disease

## 2015-09-14 ENCOUNTER — Ambulatory Visit: Payer: Medicare Other | Admitting: Cardiovascular Disease

## 2015-09-23 ENCOUNTER — Other Ambulatory Visit: Payer: Self-pay | Admitting: Family Medicine

## 2015-09-28 ENCOUNTER — Ambulatory Visit (INDEPENDENT_AMBULATORY_CARE_PROVIDER_SITE_OTHER): Payer: BLUE CROSS/BLUE SHIELD | Admitting: Adult Health

## 2015-09-28 ENCOUNTER — Encounter: Payer: Self-pay | Admitting: Adult Health

## 2015-09-28 VITALS — BP 110/60 | HR 99 | Ht 68.0 in | Wt 204.0 lb

## 2015-09-28 DIAGNOSIS — I5023 Acute on chronic systolic (congestive) heart failure: Secondary | ICD-10-CM

## 2015-09-28 DIAGNOSIS — Z79899 Other long term (current) drug therapy: Secondary | ICD-10-CM

## 2015-09-28 DIAGNOSIS — I420 Dilated cardiomyopathy: Secondary | ICD-10-CM | POA: Diagnosis not present
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
## 2015-09-28 NOTE — Patient Instructions (Signed)
Your physician recommends that you schedule a follow-up appointment in 1 Week   Your physician recommends that you return for lab work on [place].   Your physician has recommended you make the following change in your medication:   Take Lasix 80 mg in the Morning and 40 mg in the Evening on Saturday, Sunday and Monday. Take an extra Potassium on Saturday, Sunday, Monday.   Resume normal Lasix and Potassium on Tuesday.   If you need a refill on your cardiac medications before your next appointment, please call your pharmacy.  Thank you for choosing [facility]!

## 2015-09-28 NOTE — Progress Notes (Signed)
Name: Phillip Wilson    DOB: 18-Nov-1945  Age: 70 y.o.  MR#: [id number]       PCP:  Vic Blackbird, MD      Insurance: Payor: BLUE [place] / Plan: BCBS OTHER / Product Type: *No Product type* /   CC:   No chief complaint on file.   VS Vitals:   09/28/15 1437  BP: 110/60  Pulse: 99  SpO2: 97%  Weight: 204 lb (92.5 kg)  Height: 5\' 8"  (1.727 m)    Weights Current Weight  09/28/15 204 lb (92.5 kg)  07/09/15 182 lb (82.6 kg)  03/30/15 198 lb (89.8 kg)    Blood Pressure  BP Readings from Last 3 Encounters:  09/28/15 110/60  07/09/15 (!) 110/58  03/30/15 (!) 148/88     Admit date:  (Not on file) Last encounter with RMR:  07/09/2015   Allergy Review of patient's allergies indicates no known allergies.  Current Outpatient Prescriptions  Medication Sig Dispense Refill  . atorvastatin (LIPITOR) 40 MG tablet Take 1 tablet (40 mg total) by mouth daily. 90 tablet 2  . digoxin (LANOXIN) 0.25 MG tablet Take 1 tablet (250 mcg total) by mouth daily. 90 tablet 2  . edoxaban (SAVAYSA) 60 MG TABS tablet Take 60 mg by mouth daily. 90 tablet 3  . furosemide (LASIX) 80 MG tablet TAKE ONE TABLET BY MOUTH IN THE MORNING FOR  CONGESTIVE  HEART  FAILURE 90 tablet 2  . lisinopril (PRINIVIL,ZESTRIL) 5 MG tablet Take 1 tablet (5 mg total) by mouth 2 (two) times daily. FOR YOUR HEART AND BLOOD PRESSURE 180 tablet 2  . metoprolol succinate (TOPROL-XL) 50 MG 24 hr tablet Take 1 tablet (50 mg total) by mouth daily. Take with or immediately following a meal. 90 tablet 3  . potassium chloride SA (KLOR-CON M20) 20 MEQ tablet Take 1 tablet (20 mEq total) by mouth 2 (two) times daily. 180 tablet 1   No current facility-administered medications for this visit.     Discontinued Meds:    Medications Discontinued During This Encounter  Medication Reason  . HYDROcodone-acetaminophen (NORCO/VICODIN) 5-325 MG tablet Error    Patient Active Problem List   Diagnosis Date Noted  . Routine general  medical examination at a health care facility 05/03/2012  . Shoulder pain 06/02/2011  . Gastroesophageal reflux disease   . Diabetes mellitus, type 2 ([place])   . Thrombocytopenia ([place]) 01/24/2011  . Cardiomyopathy ([place]) 12/28/2010  . Atrial fibrillation ([place]) 12/28/2010  . Vitamin D deficiency 09/03/2010  . Hyperlipidemia 05/05/2006  . Essential hypertension 04/02/2006    LABS    Component Value Date/Time   NA 138 03/23/2015 1119   NA 140 02/22/2015 0805   NA 141 11/13/2014 1209   K 4.2 03/23/2015 1119   K 3.7 02/22/2015 0805   K 4.0 11/13/2014 1209   CL 100 03/23/2015 1119   CL 98 02/22/2015 0805   CL 104 11/13/2014 1209   CO2 29 03/23/2015 1119   CO2 31 02/22/2015 0805   CO2 28 11/13/2014 1209   GLUCOSE 114 (H) 03/23/2015 1119   GLUCOSE 148 (H) 02/22/2015 0805   GLUCOSE 113 (H) 11/13/2014 1209   BUN 22 03/23/2015 1119   BUN 27 (H) 02/22/2015 0805   BUN 15 11/13/2014 1209   CREATININE 1.21 03/23/2015 1119   CREATININE 1.48 (H) 02/22/2015 0805   CREATININE 1.22 11/13/2014 1209   CALCIUM 8.3 (L) 03/23/2015 1119   CALCIUM 8.8 02/22/2015 0805   CALCIUM 8.7  11/13/2014 1209   GFRNONAA 64 08/26/2013 1217   GFRNONAA 66 (L) 07/25/2013 2201   GFRNONAA 53 (L) 01/26/2011 0457   GFRNONAA 57 (L) 01/25/2011 0455   GFRAA 74 08/26/2013 1217   GFRAA 76 (L) 07/25/2013 2201   GFRAA 62 (L) 01/26/2011 0457   GFRAA 67 (L) 01/25/2011 0455   CMP     Component Value Date/Time   NA 138 03/23/2015 1119   K 4.2 03/23/2015 1119   CL 100 03/23/2015 1119   CO2 29 03/23/2015 1119   GLUCOSE 114 (H) 03/23/2015 1119   BUN 22 03/23/2015 1119   CREATININE 1.21 03/23/2015 1119   CALCIUM 8.3 (L) 03/23/2015 1119   PROT 6.3 11/13/2014 1209   ALBUMIN 3.5 (L) 11/13/2014 1209   AST 26 11/13/2014 1209   ALT 14 11/13/2014 1209   ALKPHOS 122 (H) 11/13/2014 1209   BILITOT 1.8 (H) 11/13/2014 1209   GFRNONAA 64 08/26/2013 1217   GFRAA 74 08/26/2013 1217       Component Value Date/Time   WBC 7.2  02/22/2015 0805   WBC 10.6 (H) 12/04/2014 0906   WBC 7.4 11/13/2014 1209   HGB 15.9 02/22/2015 0805   HGB 15.5 12/04/2014 0906   HGB 15.0 11/13/2014 1209   HCT 48.8 02/22/2015 0805   HCT 46.9 12/04/2014 0906   HCT 46.9 11/13/2014 1209   MCV 90.0 02/22/2015 0805   MCV 86.7 12/04/2014 0906   MCV 88.5 11/13/2014 1209    Lipid Panel     Component Value Date/Time   CHOL 149 11/13/2014 1209   TRIG 97 11/13/2014 1209   HDL 65 11/13/2014 1209   CHOLHDL 2.3 11/13/2014 1209   VLDL 19 11/13/2014 1209   LDLCALC 65 11/13/2014 1209   LDLDIRECT 57 01/08/2012 1626    ABG No results found for: PHART, PCO2ART, PO2ART, HCO3, TCO2, ACIDBASEDEF, O2SAT   Lab Results  Component Value Date   TSH 1.425 01/22/2011   BNP (last 3 results) No results for input(s): BNP in the last 8760 hours.  ProBNP (last 3 results) No results for input(s): PROBNP in the last 8760 hours.  Cardiac Panel (last 3 results) No results for input(s): CKTOTAL, CKMB, TROPONINI, RELINDX in the last 72 hours.  Iron/TIBC/Ferritin/ %Sat No results found for: IRON, TIBC, FERRITIN, IRONPCTSAT   EKG Orders placed or performed in visit on 02/22/15  . EKG 12-Lead     Prior Assessment and Plan Problem List as of 09/28/2015 Reviewed: 07/09/2015  4:31 PM by Jory Sims, NP     Cardiovascular and Mediastinum   Essential hypertension   Last Assessment & Plan 11/13/2014 Office Visit Written 11/13/2014 12:19 PM by Alycia Rossetti, MD    Controlled with current regimen      Cardiomyopathy [facility])   Last Assessment & Plan 11/13/2014 Office Visit Written 11/13/2014 12:20 PM by Alycia Rossetti, MD    Continue with lasix, no CP, no decompensation      Atrial fibrillation [facility])   Last Assessment & Plan 11/13/2014 Office Visit Written 11/13/2014 12:18 PM by Alycia Rossetti, MD    Chronic A fib on [phone] for anti-coagulation  Rate controlled with digoxin and Toprol        Digestive   Gastroesophageal reflux disease      Endocrine   Diabetes mellitus, type 2 [facility])   Last Assessment & Plan 11/13/2014 Office Visit Edited 11/13/2014 12:19 PM by Alycia Rossetti, MD    Diet controlled, recheck A1C, renal function, urine micro Per  pt weight loss intentional will continue to monitor, he is very active, and still working, feels well at this weight        Other   Hyperlipidemia   Last Assessment & Plan 04/11/2013 Office Visit Written 04/11/2013  3:51 PM by Alycia Rossetti, MD    Check non fasting labs, he does not have lipitor with him, I am not sure what he is taking      Vitamin D deficiency   Last Assessment & Plan 09/17/2010 Office Visit Written 09/17/2010  5:14 PM by Alycia Rossetti, MD    His vitamin D levels are normal. He will pick up of vitamin with calcium and vitamin D      Thrombocytopenia ([place])   Shoulder pain   Last Assessment & Plan 06/02/2011 Office Visit Written 06/02/2011  8:39 PM by Alycia Rossetti, MD    With his cardiomyopathy and CHF that was recent, will hold on oral meds and try topical NSAID if pt can get this covered by insurance. If no improvement then will send to ortho for evaluation      Routine general medical examination at a health care facility   Last Assessment & Plan 05/03/2012 Office Visit Written 05/03/2012  3:40 PM by Alycia Rossetti, MD    Doing well, shots UTD Colonoscopy UTD Fasting labs          Imaging: No results found.

## 2015-10-03 DIAGNOSIS — Z79899 Other long term (current) drug therapy: Secondary | ICD-10-CM | POA: Diagnosis not present

## 2015-10-04 ENCOUNTER — Encounter: Payer: Self-pay | Admitting: Adult Health

## 2015-10-04 ENCOUNTER — Ambulatory Visit (INDEPENDENT_AMBULATORY_CARE_PROVIDER_SITE_OTHER): Payer: BLUE CROSS/BLUE SHIELD | Admitting: Adult Health

## 2015-10-04 VITALS — BP 110/70 | HR 80 | Ht 68.0 in | Wt 190.0 lb

## 2015-10-04 DIAGNOSIS — E7800 Pure hypercholesterolemia, unspecified: Secondary | ICD-10-CM | POA: Diagnosis not present

## 2015-10-04 DIAGNOSIS — I480 Paroxysmal atrial fibrillation: Secondary | ICD-10-CM

## 2015-10-04 DIAGNOSIS — I5022 Chronic systolic (congestive) heart failure: Secondary | ICD-10-CM | POA: Diagnosis not present

## 2015-10-04 LAB — BASIC METABOLIC PANEL
BUN: 26 mg/dL — AB (ref 7–25)
CALCIUM: 8.8 mg/dL (ref 8.6–10.3)
CO2: 31 mmol/L (ref 20–31)
Chloride: 99 mmol/L (ref 98–110)
Creat: 1.48 mg/dL — ABNORMAL HIGH (ref 0.70–1.25)
Glucose, Bld: 116 mg/dL — ABNORMAL HIGH (ref 65–99)
POTASSIUM: 3.8 mmol/L (ref 3.5–5.3)
SODIUM: 142 mmol/L (ref 135–146)

## 2015-10-04 LAB — CBC WITH DIFFERENTIAL/PLATELET
BASOS PCT: 0 %
Basophils Absolute: 0 cells/uL (ref 0–200)
EOS ABS: 55 {cells}/uL (ref 15–500)
Eosinophils Relative: 1 %
HCT: 45.1 % (ref 38.5–50.0)
Hemoglobin: 14.3 g/dL (ref 13.2–17.1)
Lymphocytes Relative: 18 %
Lymphs Abs: 990 cells/uL (ref 850–3900)
MCH: 27.4 pg (ref 27.0–33.0)
MCHC: 31.7 g/dL — ABNORMAL LOW (ref 32.0–36.0)
MCV: 86.6 fL (ref 80.0–100.0)
MONOS PCT: 10 %
Monocytes Absolute: 550 cells/uL (ref 200–950)
NEUTROS ABS: 3905 {cells}/uL (ref 1500–7800)
Neutrophils Relative %: 71 %
PLATELETS: 94 10*3/uL — AB (ref 140–400)
RBC: 5.21 MIL/uL (ref 4.20–5.80)
RDW: 16.9 % — AB (ref 11.0–15.0)
WBC: 5.5 10*3/uL (ref 3.8–10.8)
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
## 2015-10-04 NOTE — Patient Instructions (Signed)
Your physician recommends that you schedule a follow-up appointment in: 1 Month with Jory Sims, NP.  Your physician recommends that you continue on your current medications as directed. Please refer to the Current Medication list given to you today.  You may take an extra Lasix if you gain 3-4 pounds over a week.   If you need a refill on your cardiac medications before your next appointment, please call your pharmacy.  Thank you for choosing [facility]!

## 2015-10-04 NOTE — Progress Notes (Signed)
Name: Phillip Wilson    DOB: [birth year]  Age: 70 y.o.  MR#: [id number]       PCP:  Vic Blackbird, MD      Insurance: Payor: [employer]UE [place] / Plan: BCBS OTHER / Product Type: *No Product type* /   CC:   No chief complaint on file.   VS Vitals:   10/04/15 1553  Pulse: 80  SpO2: 94%  Weight: 190 lb (86.2 kg)  Height: 5\' 8"  (1.727 m)    Weights Current Weight  10/04/15 190 lb (86.2 kg)  09/28/15 204 lb (92.5 kg)  07/09/15 182 lb (82.6 kg)    Blood Pressure  BP Readings from Last 3 Encounters:  09/28/15 110/60  07/09/15 (!) 110/58  03/30/15 (!) 148/88     Admit date:  (Not on file) Last encounter with RMR:  09/28/2015   Allergy Review of patient's allergies indicates no known allergies.  Current Outpatient Prescriptions  Medication Sig Dispense Refill  . atorvastatin (LIPITOR) 40 MG tablet Take 1 tablet (40 mg total) by mouth daily. 90 tablet 2  . digoxin (LANOXIN) 0.25 MG tablet Take 1 tablet (250 mcg total) by mouth daily. 90 tablet 2  . edoxaban (SAVAYSA) 60 MG TABS tablet Take 60 mg by mouth daily. 90 tablet 3  . furosemide (LASIX) 80 MG tablet TAKE ONE TABLET BY MOUTH IN THE MORNING FOR  CONGESTIVE  HEART  FAILURE 90 tablet 2  . lisinopril (PRINIVIL,ZESTRIL) 5 MG tablet Take 1 tablet (5 mg total) by mouth 2 (two) times daily. FOR YOUR HEART AND BLOOD PRESSURE 180 tablet 2  . metoprolol succinate (TOPROL-XL) 50 MG 24 hr tablet Take 1 tablet (50 mg total) by mouth daily. Take with or immediately following a meal. 90 tablet 3  . potassium chloride SA (KLOR-CON M20) 20 MEQ tablet Take 1 tablet (20 mEq total) by mouth 2 (two) times daily. 180 tablet 1   No current facility-administered medications for this visit.     Discontinued Meds:   There are no discontinued medications.  Patient Active Problem List   Diagnosis Date Noted  . Routine general medical examination at a health care facility 05/03/2012  . Shoulder pain 06/02/2011  . Gastroesophageal reflux  disease   . Diabetes mellitus, type 2 ([place])   . Thrombocytopenia ([place]) 01/24/2011  . Cardiomyopathy ([place]) 12/28/2010  . Atrial fibrillation ([place]) 12/28/2010  . Vitamin D deficiency 09/03/2010  . Hyperlipidemia 05/05/2006  . Essential hypertension 04/02/2006    LABS    Component Value Date/Time   NA 142 10/03/2015 0815   NA 138 03/23/2015 1119   NA 140 02/22/2015 0805   K 3.8 10/03/2015 0815   K 4.2 03/23/2015 1119   K 3.7 02/22/2015 0805   CL 99 10/03/2015 0815   CL 100 03/23/2015 1119   CL 98 02/22/2015 0805   CO2 31 10/03/2015 0815   CO2 29 03/23/2015 1119   CO2 31 02/22/2015 0805   GLUCOSE 116 (H) 10/03/2015 0815   GLUCOSE 114 (H) 03/23/2015 1119   GLUCOSE 148 (H) 02/22/2015 0805   BUN 26 (H) 10/03/2015 0815   BUN 22 03/23/2015 1119   BUN 27 (H) 02/22/2015 0805   CREATININE 1.48 (H) 10/03/2015 0815   CREATININE 1.21 03/23/2015 1119   CREATININE 1.48 (H) 02/22/2015 0805   CALCIUM 8.8 10/03/2015 0815   CALCIUM 8.3 (L) 03/23/2015 1119   CALCIUM 8.8 02/22/2015 0805   GFRNONAA 64 08/26/2013 1217   GFRNONAA 66 (L) 07/25/2013 2201  GFRNONAA 53 (L) 01/26/2011 0457   GFRNONAA 57 (L) 01/25/2011 0455   GFRAA 74 08/26/2013 1217   GFRAA 76 (L) 07/25/2013 2201   GFRAA 62 (L) 01/26/2011 0457   GFRAA 67 (L) 01/25/2011 0455   CMP     Component Value Date/Time   NA 142 10/03/2015 0815   K 3.8 10/03/2015 0815   CL 99 10/03/2015 0815   CO2 31 10/03/2015 0815   GLUCOSE 116 (H) 10/03/2015 0815   BUN 26 (H) 10/03/2015 0815   CREATININE 1.48 (H) 10/03/2015 0815   CALCIUM 8.8 10/03/2015 0815   PROT 6.3 11/13/2014 1209   ALBUMIN 3.5 (L) 11/13/2014 1209   AST 26 11/13/2014 1209   ALT 14 11/13/2014 1209   ALKPHOS 122 (H) 11/13/2014 1209   BILITOT 1.8 (H) 11/13/2014 1209   GFRNONAA 64 08/26/2013 1217   GFRAA 74 08/26/2013 1217       Component Value Date/Time   WBC 5.5 10/03/2015 0815   WBC 7.2 02/22/2015 0805   WBC 10.6 (H) 12/04/2014 0906   HGB 14.3 10/03/2015 0815    HGB 15.9 02/22/2015 0805   HGB 15.5 12/04/2014 0906   HCT 45.1 10/03/2015 0815   HCT 48.8 02/22/2015 0805   HCT 46.9 12/04/2014 0906   MCV 86.6 10/03/2015 0815   MCV 90.0 02/22/2015 0805   MCV 86.7 12/04/2014 0906    Lipid Panel     Component Value Date/Time   CHOL 149 11/13/2014 1209   TRIG 97 11/13/2014 1209   HDL 65 11/13/2014 1209   CHOLHDL 2.3 11/13/2014 1209   VLDL 19 11/13/2014 1209   LDLCALC 65 11/13/2014 1209   LDLDIRECT 57 01/08/2012 1626    ABG No results found for: PHART, PCO2ART, PO2ART, HCO3, TCO2, ACIDBASEDEF, O2SAT   Lab Results  Component Value Date   TSH 1.425 01/22/2011   BNP (last 3 results) No results for input(s): BNP in the last 8760 hours.  ProBNP (last 3 results) No results for input(s): PROBNP in the last 8760 hours.  Cardiac Panel (last 3 results) No results for input(s): CKTOTAL, CKMB, TROPONINI, RELINDX in the last 72 hours.  Iron/TIBC/Ferritin/ %Sat No results found for: IRON, TIBC, FERRITIN, IRONPCTSAT   EKG Orders placed or performed in visit on 02/22/15  . EKG 12-Lead     Prior Assessment and Plan Problem List as of 10/04/2015 Reviewed: 09/28/2015  3:32 PM by Jory Sims, NP     Cardiovascular and Mediastinum   Essential hypertension   Last Assessment & Plan 11/13/2014 Office Visit Written 11/13/2014 12:19 PM by Alycia Rossetti, MD    Controlled with current regimen      Cardiomyopathy [facility])   Last Assessment & Plan 11/13/2014 Office Visit Written 11/13/2014 12:20 PM by Alycia Rossetti, MD    Continue with lasix, no CP, no decompensation      Atrial fibrillation [facility])   Last Assessment & Plan 11/13/2014 Office Visit Written 11/13/2014 12:18 PM by Alycia Rossetti, MD    Chronic A fib on [phone] for anti-coagulation  Rate controlled with digoxin and Toprol        Digestive   Gastroesophageal reflux disease     Endocrine   Diabetes mellitus, type 2 [facility])   Last Assessment & Plan 11/13/2014 Office Visit Edited  11/13/2014 12:19 PM by Alycia Rossetti, MD    Diet controlled, recheck A1C, renal function, urine micro Per pt weight loss intentional will continue to monitor, he is very active, and still working, feels  well at this weight        Other   Hyperlipidemia   Last Assessment & Plan 04/11/2013 Office Visit Written 04/11/2013  3:51 PM by Alycia Rossetti, MD    Check non fasting labs, he does not have lipitor with him, I am not sure what he is taking      Vitamin D deficiency   Last Assessment & Plan 09/17/2010 Office Visit Written 09/17/2010  5:14 PM by Alycia Rossetti, MD    His vitamin D levels are normal. He will pick up of vitamin with calcium and vitamin D      Thrombocytopenia ([place])   Shoulder pain   Last Assessment & Plan 06/02/2011 Office Visit Written 06/02/2011  8:39 PM by Alycia Rossetti, MD    With his cardiomyopathy and CHF that was recent, will hold on oral meds and try topical NSAID if pt can get this covered by insurance. If no improvement then will send to ortho for evaluation      Routine general medical examination at a health care facility   Last Assessment & Plan 05/03/2012 Office Visit Written 05/03/2012  3:40 PM by Alycia Rossetti, MD    Doing well, shots UTD Colonoscopy UTD Fasting labs          Imaging: No results found.

## 2015-10-10 ENCOUNTER — Emergency Department (HOSPITAL_COMMUNITY): Payer: BLUE CROSS/BLUE SHIELD

## 2015-10-10 ENCOUNTER — Encounter (HOSPITAL_COMMUNITY): Payer: Self-pay | Admitting: Emergency Medicine

## 2015-10-10 ENCOUNTER — Emergency Department (HOSPITAL_COMMUNITY)
Admission: EM | Admit: 2015-10-10 | Discharge: 2015-10-10 | Disposition: A | Discharge status: Home / Self Care | Payer: BLUE CROSS/BLUE SHIELD | Attending: Emergency Medicine | Admitting: Emergency Medicine

## 2015-10-10 DIAGNOSIS — E1122 Type 2 diabetes mellitus with diabetic chronic kidney disease: Secondary | ICD-10-CM | POA: Diagnosis not present

## 2015-10-10 DIAGNOSIS — I129 Hypertensive chronic kidney disease with stage 1 through stage 4 chronic kidney disease, or unspecified chronic kidney disease: Secondary | ICD-10-CM | POA: Insufficient documentation

## 2015-10-10 DIAGNOSIS — Z79899 Other long term (current) drug therapy: Secondary | ICD-10-CM | POA: Insufficient documentation

## 2015-10-10 DIAGNOSIS — M25561 Pain in right knee: Secondary | ICD-10-CM

## 2015-10-10 DIAGNOSIS — M179 Osteoarthritis of knee, unspecified: Secondary | ICD-10-CM | POA: Diagnosis not present

## 2015-10-10 DIAGNOSIS — M25461 Effusion, right knee: Secondary | ICD-10-CM | POA: Diagnosis present

## 2015-10-10 DIAGNOSIS — N189 Chronic kidney disease, unspecified: Secondary | ICD-10-CM | POA: Diagnosis not present

## 2015-10-10 DIAGNOSIS — M7989 Other specified soft tissue disorders: Secondary | ICD-10-CM | POA: Diagnosis not present

## 2015-10-10 MED ORDER — TRAMADOL HCL 50 MG PO TABS: 50.0000 mg | ORAL_TABLET | Freq: Four times a day (QID) | ORAL | 0 refills | Status: DC | PRN

## 2015-10-10 MED ORDER — ACETAMINOPHEN 500 MG PO TABS
1000.0000 mg | ORAL_TABLET | Freq: Once | ORAL | Status: AC
  Administered 2015-10-10: 1000 mg via ORAL
  Filled 2015-10-10: qty 2
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
## 2015-10-10 NOTE — ED Triage Notes (Signed)
Rt knee pain and swelling since yesterday.  Pt states he has had gout in the past.

## 2015-10-16 DIAGNOSIS — I10 Essential (primary) hypertension: Secondary | ICD-10-CM | POA: Diagnosis not present

## 2015-10-16 DIAGNOSIS — I509 Heart failure, unspecified: Secondary | ICD-10-CM | POA: Diagnosis not present

## 2015-10-16 DIAGNOSIS — K219 Gastro-esophageal reflux disease without esophagitis: Secondary | ICD-10-CM | POA: Diagnosis not present

## 2015-10-16 DIAGNOSIS — E669 Obesity, unspecified: Secondary | ICD-10-CM | POA: Diagnosis not present

## 2015-10-23 DIAGNOSIS — Z7189 Other specified counseling: Secondary | ICD-10-CM | POA: Diagnosis not present

## 2015-11-05 ENCOUNTER — Encounter: Payer: Self-pay | Admitting: Adult Health

## 2015-11-05 ENCOUNTER — Ambulatory Visit (INDEPENDENT_AMBULATORY_CARE_PROVIDER_SITE_OTHER): Payer: BLUE CROSS/BLUE SHIELD | Admitting: Adult Health

## 2015-11-05 VITALS — BP 120/80 | HR 77 | Ht 68.0 in | Wt 202.0 lb

## 2015-11-05 DIAGNOSIS — I5023 Acute on chronic systolic (congestive) heart failure: Secondary | ICD-10-CM | POA: Diagnosis not present

## 2015-11-05 DIAGNOSIS — Z79899 Other long term (current) drug therapy: Secondary | ICD-10-CM | POA: Diagnosis not present

## 2015-11-05 DIAGNOSIS — I422 Other hypertrophic cardiomyopathy: Secondary | ICD-10-CM | POA: Diagnosis not present

## 2015-11-05 NOTE — Patient Instructions (Signed)
Your physician recommends that you schedule a follow-up appointment in: 1 Week with Jory Sims, NP.   Your physician has recommended you make the following change in your medication:  Today: Take Extra Lasix 80 mg with Extra Potassium   Tomorrow and the next day: Take Lasix 80 mg in the AM and Take Lasix 40 mg in the PM ( Take Extra Potassium for these two days).   Return on [facility] For Weight and Lab work Artist)   If you need a refill on your cardiac medications before your next appointment, please call your pharmacy.  Thank you for choosing [facility]!

## 2015-11-05 NOTE — Progress Notes (Signed)
Name: Phillip Wilson    DOB: [birth year]  Age: 70 y.o.  MR#: [id number]       PCP:  Vic Blackbird, MD      Insurance: Payor: BLUE [place] / Plan: BCBS OTHER / Product Type: *No Product type* /   CC:   No chief complaint on file.   VS Vitals:   11/05/15 1413  BP: 120/80  Pulse: 77  SpO2: 99%  Weight: 202 lb (91.6 kg)  Height: 5\' 8"  (1.727 m)    Weights Current Weight  11/05/15 202 lb (91.6 kg)  10/10/15 190 lb (86.2 kg)  10/04/15 190 lb (86.2 kg)    Blood Pressure  BP Readings from Last 3 Encounters:  11/05/15 120/80  10/10/15 115/91  10/04/15 110/70     Admit date:  (Not on file) Last encounter with RMR:  10/04/2015   Allergy Review of patient's allergies indicates no known allergies.  Current Outpatient Prescriptions  Medication Sig Dispense Refill  . atorvastatin (LIPITOR) 40 MG tablet Take 1 tablet (40 mg total) by mouth daily. 90 tablet 2  . digoxin (LANOXIN) 0.25 MG tablet Take 1 tablet (250 mcg total) by mouth daily. 90 tablet 2  . edoxaban (SAVAYSA) 60 MG TABS tablet Take 60 mg by mouth daily. 90 tablet 3  . furosemide (LASIX) 80 MG tablet TAKE ONE TABLET BY MOUTH IN THE MORNING FOR  CONGESTIVE  HEART  FAILURE 90 tablet 2  . lisinopril (PRINIVIL,ZESTRIL) 5 MG tablet Take 1 tablet (5 mg total) by mouth 2 (two) times daily. FOR YOUR HEART AND BLOOD PRESSURE 180 tablet 2  . metoprolol succinate (TOPROL-XL) 50 MG 24 hr tablet Take 1 tablet (50 mg total) by mouth daily. Take with or immediately following a meal. 90 tablet 3  . potassium chloride SA (KLOR-CON M20) 20 MEQ tablet Take 1 tablet (20 mEq total) by mouth 2 (two) times daily. 180 tablet 1  . traMADol (ULTRAM) 50 MG tablet Take 1 tablet (50 mg total) by mouth every 6 (six) hours as needed. 15 tablet 0   No current facility-administered medications for this visit.     Discontinued Meds:   There are no discontinued medications.  Patient Active Problem List   Diagnosis Date Noted  . Routine  general medical examination at a health care facility 05/03/2012  . Shoulder pain 06/02/2011  . Gastroesophageal reflux disease   . Diabetes mellitus, type 2 ([place])   . Thrombocytopenia ([place]) 01/24/2011  . Cardiomyopathy ([place]) 12/28/2010  . Atrial fibrillation ([place]) 12/28/2010  . Vitamin D deficiency 09/03/2010  . Hyperlipidemia 05/05/2006  . Essential hypertension 04/02/2006    LABS    Component Value Date/Time   NA 142 10/03/2015 0815   NA 138 03/23/2015 1119   NA 140 02/22/2015 0805   K 3.8 10/03/2015 0815   K 4.2 03/23/2015 1119   K 3.7 02/22/2015 0805   CL 99 10/03/2015 0815   CL 100 03/23/2015 1119   CL 98 02/22/2015 0805   CO2 31 10/03/2015 0815   CO2 29 03/23/2015 1119   CO2 31 02/22/2015 0805   GLUCOSE 116 (H) 10/03/2015 0815   GLUCOSE 114 (H) 03/23/2015 1119   GLUCOSE 148 (H) 02/22/2015 0805   BUN 26 (H) 10/03/2015 0815   BUN 22 03/23/2015 1119   BUN 27 (H) 02/22/2015 0805   CREATININE 1.48 (H) 10/03/2015 0815   CREATININE 1.21 03/23/2015 1119   CREATININE 1.48 (H) 02/22/2015 0805   CALCIUM 8.8 10/03/2015 0815  CALCIUM 8.3 (L) 03/23/2015 1119   CALCIUM 8.8 02/22/2015 0805   GFRNONAA 64 08/26/2013 1217   GFRNONAA 66 (L) 07/25/2013 2201   GFRNONAA 53 (L) 01/26/2011 0457   GFRNONAA 57 (L) 01/25/2011 0455   GFRAA 74 08/26/2013 1217   GFRAA 76 (L) 07/25/2013 2201   GFRAA 62 (L) 01/26/2011 0457   GFRAA 67 (L) 01/25/2011 0455   CMP     Component Value Date/Time   NA 142 10/03/2015 0815   K 3.8 10/03/2015 0815   CL 99 10/03/2015 0815   CO2 31 10/03/2015 0815   GLUCOSE 116 (H) 10/03/2015 0815   BUN 26 (H) 10/03/2015 0815   CREATININE 1.48 (H) 10/03/2015 0815   CALCIUM 8.8 10/03/2015 0815   PROT 6.3 11/13/2014 1209   ALBUMIN 3.5 (L) 11/13/2014 1209   AST 26 11/13/2014 1209   ALT 14 11/13/2014 1209   ALKPHOS 122 (H) 11/13/2014 1209   BILITOT 1.8 (H) 11/13/2014 1209   GFRNONAA 64 08/26/2013 1217   GFRAA 74 08/26/2013 1217       Component Value  Date/Time   WBC 5.5 10/03/2015 0815   WBC 7.2 02/22/2015 0805   WBC 10.6 (H) 12/04/2014 0906   HGB 14.3 10/03/2015 0815   HGB 15.9 02/22/2015 0805   HGB 15.5 12/04/2014 0906   HCT 45.1 10/03/2015 0815   HCT 48.8 02/22/2015 0805   HCT 46.9 12/04/2014 0906   MCV 86.6 10/03/2015 0815   MCV 90.0 02/22/2015 0805   MCV 86.7 12/04/2014 0906    Lipid Panel     Component Value Date/Time   CHOL 149 11/13/2014 1209   TRIG 97 11/13/2014 1209   HDL 65 11/13/2014 1209   CHOLHDL 2.3 11/13/2014 1209   VLDL 19 11/13/2014 1209   LDLCALC 65 11/13/2014 1209   LDLDIRECT 57 01/08/2012 1626    ABG No results found for: PHART, PCO2ART, PO2ART, HCO3, TCO2, ACIDBASEDEF, O2SAT   Lab Results  Component Value Date   TSH 1.425 01/22/2011   BNP (last 3 results) No results for input(s): BNP in the last 8760 hours.  ProBNP (last 3 results) No results for input(s): PROBNP in the last 8760 hours.  Cardiac Panel (last 3 results) No results for input(s): CKTOTAL, CKMB, TROPONINI, RELINDX in the last 72 hours.  Iron/TIBC/Ferritin/ %Sat No results found for: IRON, TIBC, FERRITIN, IRONPCTSAT   EKG Orders placed or performed during the hospital encounter of 10/10/15  . ED EKG  . ED EKG  . EKG 12-Lead  . EKG 12-Lead  . EKG     Prior Assessment and Plan Problem List as of 11/05/2015 Reviewed: 10/04/2015  4:44 PM by Jory Sims, NP     Cardiovascular and Mediastinum   Essential hypertension   Last Assessment & Plan 11/13/2014 Office Visit Written 11/13/2014 12:19 PM by Alycia Rossetti, MD    Controlled with current regimen      Cardiomyopathy [facility])   Last Assessment & Plan 11/13/2014 Office Visit Written 11/13/2014 12:20 PM by Alycia Rossetti, MD    Continue with lasix, no CP, no decompensation      Atrial fibrillation [facility])   Last Assessment & Plan 11/13/2014 Office Visit Written 11/13/2014 12:18 PM by Alycia Rossetti, MD    Chronic A fib on [phone] for anti-coagulation  Rate  controlled with digoxin and Toprol        Digestive   Gastroesophageal reflux disease     Endocrine   Diabetes mellitus, type 2 ([place])   Last  Assessment & Plan 11/13/2014 Office Visit Edited 11/13/2014 12:19 PM by Alycia Rossetti, MD    Diet controlled, recheck A1C, renal function, urine micro Per pt weight loss intentional will continue to monitor, he is very active, and still working, feels well at this weight        Other   Hyperlipidemia   Last Assessment & Plan 04/11/2013 Office Visit Written 04/11/2013  3:51 PM by Alycia Rossetti, MD    Check non fasting labs, he does not have lipitor with him, I am not sure what he is taking      Vitamin D deficiency   Last Assessment & Plan 09/17/2010 Office Visit Written 09/17/2010  5:14 PM by Alycia Rossetti, MD    His vitamin D levels are normal. He will pick up of vitamin with calcium and vitamin D      Thrombocytopenia ([place])   Shoulder pain   Last Assessment & Plan 06/02/2011 Office Visit Written 06/02/2011  8:39 PM by Alycia Rossetti, MD    With his cardiomyopathy and CHF that was recent, will hold on oral meds and try topical NSAID if pt can get this covered by insurance. If no improvement then will send to ortho for evaluation      Routine general medical examination at a health care facility   Last Assessment & Plan 05/03/2012 Office Visit Written 05/03/2012  3:40 PM by Alycia Rossetti, MD    Doing well, shots UTD Colonoscopy UTD Fasting labs          Imaging: US Venous Img Lower Unilateral Right  Result Date: 10/10/2015 CLINICAL DATA:  Right knee pain and swelling for the past day. History of gout. Evaluate for DVT. EXAM: RIGHT LOWER EXTREMITY VENOUS DOPPLER ULTRASOUND TECHNIQUE: Gray-scale sonography with graded compression, as well as color Doppler and duplex ultrasound were performed to evaluate the lower extremity deep venous systems from the level of the common femoral vein and including the common femoral, femoral,  profunda femoral, popliteal and calf veins including the posterior tibial, peroneal and gastrocnemius veins when visible. The superficial great saphenous vein was also interrogated. Spectral Doppler was utilized to evaluate flow at rest and with distal augmentation maneuvers in the common femoral, femoral and popliteal veins. COMPARISON:  None. FINDINGS: Contralateral Common Femoral Vein: Respiratory phasicity is normal and symmetric with the symptomatic side. No evidence of thrombus. Normal compressibility. Common Femoral Vein: No evidence of thrombus. Normal compressibility, respiratory phasicity and response to augmentation. Saphenofemoral Junction: No evidence of thrombus. Normal compressibility and flow on color Doppler imaging. Profunda Femoral Vein: No evidence of thrombus. Normal compressibility and flow on color Doppler imaging. Femoral Vein: No evidence of thrombus. Normal compressibility, respiratory phasicity and response to augmentation. Popliteal Vein: No evidence of thrombus. Normal compressibility, respiratory phasicity and response to augmentation. Calf Veins: No evidence of thrombus. Normal compressibility and flow on color Doppler imaging. Superficial Great Saphenous Vein: No evidence of thrombus. Normal compressibility and flow on color Doppler imaging. Venous Reflux:  None. Other Findings: There is a minimal to moderate mild subcutaneous edema at the level the right calf and lower leg. Note is made of an approximately 6.7 x 1.9 x 3.7 cm minimally complex right-sided Baker cyst. IMPRESSION: 1. No evidence DVT within right lower extremity. 2. No made of an approximately 6.7 cm minimally complex right-sided Baker cyst Electronically Signed   By: Sandi Mariscal M.D.   On: 10/10/2015 08:23   Dg Knee Complete 4 Views Right  Result Date: 10/10/2015 CLINICAL DATA:  Nontraumatic pain and swelling for 3 days. EXAM: RIGHT KNEE - COMPLETE 4+ VIEW COMPARISON:  None. FINDINGS: Negative for acute fracture or  dislocation. Moderate osteoarthritic changes are present, predominantly in the medial and patellofemoral compartments. There is no bone lesion or bony destruction. No significant joint effusion is evident. No acute soft tissue abnormality is evident. IMPRESSION: Osteoarthritis.  No acute findings are evident. Electronically Signed   By: Andreas Newport M.D.   On: 10/10/2015 06:01
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
## 2015-11-09 ENCOUNTER — Ambulatory Visit (INDEPENDENT_AMBULATORY_CARE_PROVIDER_SITE_OTHER): Payer: BLUE CROSS/BLUE SHIELD

## 2015-11-09 VITALS — HR 94 | Ht 68.0 in | Wt 186.0 lb

## 2015-11-09 DIAGNOSIS — Z79899 Other long term (current) drug therapy: Secondary | ICD-10-CM | POA: Diagnosis not present

## 2015-11-09 DIAGNOSIS — Z0130 Encounter for examination of blood pressure without abnormal findings: Secondary | ICD-10-CM

## 2015-11-09 NOTE — Progress Notes (Signed)
Pt weight was 202 @ last visit, and today it was 186 lbs. Pt states that he feels  good and is not having any problems. Will forward to Jory Sims, N.P.

## 2015-11-09 NOTE — Patient Instructions (Signed)
Your physician recommends that you continue on your current medications as directed. Please refer to the Current Medication list given to you today.  Thanks for choosing [facility]!!!   

## 2015-11-10 LAB — BASIC METABOLIC PANEL
BUN: 23 mg/dL (ref 7–25)
CHLORIDE: 97 mmol/L — AB (ref 98–110)
CO2: 36 mmol/L — ABNORMAL HIGH (ref 20–31)
Calcium: 8.8 mg/dL (ref 8.6–10.3)
Creat: 1.34 mg/dL — ABNORMAL HIGH (ref 0.70–1.25)
GLUCOSE: 83 mg/dL (ref 65–99)
POTASSIUM: 3.4 mmol/L — AB (ref 3.5–5.3)
SODIUM: 141 mmol/L (ref 135–146)

## 2015-11-12 ENCOUNTER — Encounter: Payer: Self-pay | Admitting: Adult Health

## 2015-11-12 ENCOUNTER — Ambulatory Visit (INDEPENDENT_AMBULATORY_CARE_PROVIDER_SITE_OTHER): Payer: BLUE CROSS/BLUE SHIELD | Admitting: Adult Health

## 2015-11-12 VITALS — BP 128/74 | HR 83 | Ht 68.0 in | Wt 191.0 lb

## 2015-11-12 DIAGNOSIS — I482 Chronic atrial fibrillation, unspecified: Secondary | ICD-10-CM

## 2015-11-12 DIAGNOSIS — I420 Dilated cardiomyopathy: Secondary | ICD-10-CM

## 2015-11-12 DIAGNOSIS — I5022 Chronic systolic (congestive) heart failure: Secondary | ICD-10-CM

## 2015-11-12 MED ORDER — SACUBITRIL-VALSARTAN 24-26 MG PO TABS: 1.0000 | ORAL_TABLET | Freq: Two times a day (BID) | ORAL | 3 refills | Status: DC
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
## 2015-11-12 NOTE — Progress Notes (Signed)
Name: Phillip Wilson    DOB: [birth year]  Age: 70 y.o.  MR#: [id number]       PCP:  Vic Blackbird, MD      Insurance: Payor: BL[employer] [place] / Plan: BCBS OTHER / Product Type: *No Product type* /   CC:   No chief complaint on file.   VS Vitals:   11/12/15 1528  Pulse: 83  SpO2: 91%  Weight: 191 lb (86.6 kg)  Height: 5\' 8"  (1.727 m)    Weights Current Weight  11/12/15 191 lb (86.6 kg)  11/09/15 186 lb (84.4 kg)  11/05/15 202 lb (91.6 kg)    Blood Pressure  BP Readings from Last 3 Encounters:  11/05/15 120/80  10/10/15 115/91  10/04/15 110/70     Admit date:  (Not on file) Last encounter with RMR:  11/05/2015   Allergy Review of patient's allergies indicates no known allergies.  Current Outpatient Prescriptions  Medication Sig Dispense Refill  . atorvastatin (LIPITOR) 40 MG tablet Take 1 tablet (40 mg total) by mouth daily. 90 tablet 2  . digoxin (LANOXIN) 0.25 MG tablet Take 1 tablet (250 mcg total) by mouth daily. 90 tablet 2  . edoxaban (SAVAYSA) 60 MG TABS tablet Take 60 mg by mouth daily. 90 tablet 3  . furosemide (LASIX) 80 MG tablet TAKE ONE TABLET BY MOUTH IN THE MORNING FOR  CONGESTIVE  HEART  FAILURE 90 tablet 2  . lisinopril (PRINIVIL,ZESTRIL) 5 MG tablet Take 1 tablet (5 mg total) by mouth 2 (two) times daily. FOR YOUR HEART AND BLOOD PRESSURE 180 tablet 2  . metoprolol succinate (TOPROL-XL) 50 MG 24 hr tablet Take 1 tablet (50 mg total) by mouth daily. Take with or immediately following a meal. 90 tablet 3  . potassium chloride SA (KLOR-CON M20) 20 MEQ tablet Take 1 tablet (20 mEq total) by mouth 2 (two) times daily. 180 tablet 1  . traMADol (ULTRAM) 50 MG tablet Take 1 tablet (50 mg total) by mouth every 6 (six) hours as needed. 15 tablet 0   No current facility-administered medications for this visit.     Discontinued Meds:   There are no discontinued medications.  Patient Active Problem List   Diagnosis Date Noted  . Routine general medical  examination at a health care facility 05/03/2012  . Shoulder pain 06/02/2011  . Gastroesophageal reflux disease   . Diabetes mellitus, type 2 ([place])   . Thrombocytopenia ([place]) 01/24/2011  . Cardiomyopathy ([place]) 12/28/2010  . Atrial fibrillation ([place]) 12/28/2010  . Vitamin D deficiency 09/03/2010  . Hyperlipidemia 05/05/2006  . Essential hypertension 04/02/2006    LABS    Component Value Date/Time   NA 141 11/09/2015 1618   NA 142 10/03/2015 0815   NA 138 03/23/2015 1119   K 3.4 (L) 11/09/2015 1618   K 3.8 10/03/2015 0815   K 4.2 03/23/2015 1119   CL 97 (L) 11/09/2015 1618   CL 99 10/03/2015 0815   CL 100 03/23/2015 1119   CO2 36 (H) 11/09/2015 1618   CO2 31 10/03/2015 0815   CO2 29 03/23/2015 1119   GLUCOSE 83 11/09/2015 1618   GLUCOSE 116 (H) 10/03/2015 0815   GLUCOSE 114 (H) 03/23/2015 1119   BUN 23 11/09/2015 1618   BUN 26 (H) 10/03/2015 0815   BUN 22 03/23/2015 1119   CREATININE 1.34 (H) 11/09/2015 1618   CREATININE 1.48 (H) 10/03/2015 0815   CREATININE 1.21 03/23/2015 1119   CALCIUM 8.8 11/09/2015 1618   CALCIUM  8.8 10/03/2015 0815   CALCIUM 8.3 (L) 03/23/2015 1119   GFRNONAA 64 08/26/2013 1217   GFRNONAA 66 (L) 07/25/2013 2201   GFRNONAA 53 (L) 01/26/2011 0457   GFRNONAA 57 (L) 01/25/2011 0455   GFRAA 74 08/26/2013 1217   GFRAA 76 (L) 07/25/2013 2201   GFRAA 62 (L) 01/26/2011 0457   GFRAA 67 (L) 01/25/2011 0455   CMP     Component Value Date/Time   NA 141 11/09/2015 1618   K 3.4 (L) 11/09/2015 1618   CL 97 (L) 11/09/2015 1618   CO2 36 (H) 11/09/2015 1618   GLUCOSE 83 11/09/2015 1618   BUN 23 11/09/2015 1618   CREATININE 1.34 (H) 11/09/2015 1618   CALCIUM 8.8 11/09/2015 1618   PROT 6.3 11/13/2014 1209   ALBUMIN 3.5 (L) 11/13/2014 1209   AST 26 11/13/2014 1209   ALT 14 11/13/2014 1209   ALKPHOS 122 (H) 11/13/2014 1209   BILITOT 1.8 (H) 11/13/2014 1209   GFRNONAA 64 08/26/2013 1217   GFRAA 74 08/26/2013 1217       Component Value Date/Time    WBC 5.5 10/03/2015 0815   WBC 7.2 02/22/2015 0805   WBC 10.6 (H) 12/04/2014 0906   HGB 14.3 10/03/2015 0815   HGB 15.9 02/22/2015 0805   HGB 15.5 12/04/2014 0906   HCT 45.1 10/03/2015 0815   HCT 48.8 02/22/2015 0805   HCT 46.9 12/04/2014 0906   MCV 86.6 10/03/2015 0815   MCV 90.0 02/22/2015 0805   MCV 86.7 12/04/2014 0906    Lipid Panel     Component Value Date/Time   CHOL 149 11/13/2014 1209   TRIG 97 11/13/2014 1209   HDL 65 11/13/2014 1209   CHOLHDL 2.3 11/13/2014 1209   VLDL 19 11/13/2014 1209   LDLCALC 65 11/13/2014 1209   LDLDIRECT 57 01/08/2012 1626    ABG No results found for: PHART, PCO2ART, PO2ART, HCO3, TCO2, ACIDBASEDEF, O2SAT   Lab Results  Component Value Date   TSH 1.425 01/22/2011   BNP (last 3 results) No results for input(s): BNP in the last 8760 hours.  ProBNP (last 3 results) No results for input(s): PROBNP in the last 8760 hours.  Cardiac Panel (last 3 results) No results for input(s): CKTOTAL, CKMB, TROPONINI, RELINDX in the last 72 hours.  Iron/TIBC/Ferritin/ %Sat No results found for: IRON, TIBC, FERRITIN, IRONPCTSAT   EKG Orders placed or performed during the hospital encounter of 10/10/15  . ED EKG  . ED EKG  . EKG 12-Lead  . EKG 12-Lead  . EKG     Prior Assessment and Plan Problem List as of 11/12/2015 Reviewed: 11/05/2015  3:02 PM by Jory Sims, NP     Cardiovascular and Mediastinum   Essential hypertension   Last Assessment & Plan 11/13/2014 Office Visit Written 11/13/2014 12:19 PM by Alycia Rossetti, MD    Controlled with current regimen      Cardiomyopathy [facility])   Last Assessment & Plan 11/13/2014 Office Visit Written 11/13/2014 12:20 PM by Alycia Rossetti, MD    Continue with lasix, no CP, no decompensation      Atrial fibrillation [facility])   Last Assessment & Plan 11/13/2014 Office Visit Written 11/13/2014 12:18 PM by Alycia Rossetti, MD    Chronic A fib on [phone] for anti-coagulation  Rate controlled with  digoxin and Toprol        Digestive   Gastroesophageal reflux disease     Endocrine   Diabetes mellitus, type 2 ([place])   Last  Assessment & Plan 11/13/2014 Office Visit Edited 11/13/2014 12:19 PM by Alycia Rossetti, MD    Diet controlled, recheck A1C, renal function, urine micro Per pt weight loss intentional will continue to monitor, he is very active, and still working, feels well at this weight        Other   Hyperlipidemia   Last Assessment & Plan 04/11/2013 Office Visit Written 04/11/2013  3:51 PM by Alycia Rossetti, MD    Check non fasting labs, he does not have lipitor with him, I am not sure what he is taking      Vitamin D deficiency   Last Assessment & Plan 09/17/2010 Office Visit Written 09/17/2010  5:14 PM by Alycia Rossetti, MD    His vitamin D levels are normal. He will pick up of vitamin with calcium and vitamin D      Thrombocytopenia ([place])   Shoulder pain   Last Assessment & Plan 06/02/2011 Office Visit Written 06/02/2011  8:39 PM by Alycia Rossetti, MD    With his cardiomyopathy and CHF that was recent, will hold on oral meds and try topical NSAID if pt can get this covered by insurance. If no improvement then will send to ortho for evaluation      Routine general medical examination at a health care facility   Last Assessment & Plan 05/03/2012 Office Visit Written 05/03/2012  3:40 PM by Alycia Rossetti, MD    Doing well, shots UTD Colonoscopy UTD Fasting labs          Imaging: No results found.

## 2015-11-12 NOTE — Patient Instructions (Signed)
Medication Instructions:  STOP LISINOPRIL  START ENTRESTO 24/26- MG TWO TIMES DAILY ( ON Wednesday EVENING- TAKE THE FIRST DOSE OF ENTRESTO. ON Thursday START TAKING THE ENTRESTO 1 DOSE IN THE MORNING- AND 1 DOSE IN THE EVENING.   ON DAYS YOU TAKE AND EXTRA 1/2 DOSE OF LASIX, TAKE AN EXTRA 1/2 DOSE OF POTASSIUM.   Labwork: NONE  Testing/Procedures: NONE  Follow-Up: Your physician recommends that you schedule a follow-up appointment in: [place]  Your physician recommends that you schedule a follow-up appointment in: [place] KATHRYN LAWRENCE, N.P.     Any Other Special Instructions Will Be Listed Below (If Applicable).     If you need a refill on your cardiac medications before your next appointment, please call your pharmacy.

## 2015-11-15 ENCOUNTER — Other Ambulatory Visit: Payer: Self-pay | Admitting: Family Medicine

## 2015-11-15 DIAGNOSIS — E119 Type 2 diabetes mellitus without complications: Secondary | ICD-10-CM

## 2015-11-15 DIAGNOSIS — Z23 Encounter for immunization: Secondary | ICD-10-CM

## 2015-11-15 DIAGNOSIS — E785 Hyperlipidemia, unspecified: Secondary | ICD-10-CM

## 2015-11-15 DIAGNOSIS — I10 Essential (primary) hypertension: Secondary | ICD-10-CM

## 2015-11-15 DIAGNOSIS — I4891 Unspecified atrial fibrillation: Secondary | ICD-10-CM

## 2015-11-15 DIAGNOSIS — E669 Obesity, unspecified: Secondary | ICD-10-CM

## 2015-11-16 ENCOUNTER — Telehealth: Payer: Self-pay

## 2015-11-16 NOTE — Telephone Encounter (Signed)
PA approved PA [id number] exp 11/15/16

## 2015-11-19 ENCOUNTER — Ambulatory Visit (INDEPENDENT_AMBULATORY_CARE_PROVIDER_SITE_OTHER): Payer: BLUE CROSS/BLUE SHIELD

## 2015-11-19 ENCOUNTER — Telehealth: Payer: Self-pay

## 2015-11-19 VITALS — BP 109/74 | HR 80 | Ht 68.0 in | Wt 193.6 lb

## 2015-11-19 DIAGNOSIS — E876 Hypokalemia: Secondary | ICD-10-CM

## 2015-11-19 MED ORDER — POTASSIUM CHLORIDE CRYS ER 20 MEQ PO TBCR: EXTENDED_RELEASE_TABLET | ORAL | 3 refills | Status: DC

## 2015-11-19 NOTE — Progress Notes (Signed)
BP check after starting Entresto 109/74, pt has not had to take extra lasix but 4 times.he has taken the extra K+ with it. Per Arnold Long NP increase potassium to 40 meq in the am and 20 meq pm,repeat BMET in 1 week

## 2015-11-19 NOTE — Patient Instructions (Addendum)
INCREASE Potassium to 40 meq (2 - 20 meq tablets) in the am and take 20 meq ( 1 tablet in the pm)   Get lab work in 1 week ( Next Monday 11/26/15)      Keep apt with Arnold Long NP next Monday 11/26/15 at 3:30 pm      Thank you for choosing Phillip Wilson !

## 2015-11-19 NOTE — Telephone Encounter (Signed)
-----   Message from Lendon Colonel, NP sent at 11/12/2015  7:12 AM EDT ----- Please make sure he is taking potassium as directed. May need to increase dose if he is.

## 2015-11-19 NOTE — Telephone Encounter (Signed)
mailed letter,unable to reach

## 2015-11-22 NOTE — Progress Notes (Signed)
Excellent

## 2015-11-26 ENCOUNTER — Ambulatory Visit: Payer: BLUE CROSS/BLUE SHIELD | Admitting: Adult Health

## 2015-12-01 ENCOUNTER — Other Ambulatory Visit: Payer: Self-pay | Admitting: Family Medicine

## 2015-12-01 DIAGNOSIS — E119 Type 2 diabetes mellitus without complications: Secondary | ICD-10-CM

## 2015-12-01 DIAGNOSIS — I10 Essential (primary) hypertension: Secondary | ICD-10-CM

## 2015-12-01 DIAGNOSIS — I4891 Unspecified atrial fibrillation: Secondary | ICD-10-CM

## 2015-12-01 DIAGNOSIS — E669 Obesity, unspecified: Secondary | ICD-10-CM

## 2015-12-01 DIAGNOSIS — E785 Hyperlipidemia, unspecified: Secondary | ICD-10-CM

## 2015-12-01 DIAGNOSIS — Z23 Encounter for immunization: Secondary | ICD-10-CM

## 2015-12-03 NOTE — Telephone Encounter (Signed)
Pt has not been seen by [place] in over 1 year.  Is being seen frequently by Cardiology.  Told pharmacy to sent to them

## 2015-12-10 ENCOUNTER — Encounter: Payer: Self-pay | Admitting: Adult Health

## 2015-12-10 ENCOUNTER — Ambulatory Visit: Payer: BLUE CROSS/BLUE SHIELD | Admitting: Adult Health
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
## 2015-12-25 DIAGNOSIS — R918 Other nonspecific abnormal finding of lung field: Secondary | ICD-10-CM | POA: Diagnosis not present

## 2015-12-25 DIAGNOSIS — I110 Hypertensive heart disease with heart failure: Secondary | ICD-10-CM | POA: Diagnosis not present

## 2015-12-25 DIAGNOSIS — I509 Heart failure, unspecified: Secondary | ICD-10-CM | POA: Diagnosis not present

## 2015-12-25 DIAGNOSIS — R600 Localized edema: Secondary | ICD-10-CM | POA: Diagnosis not present

## 2015-12-25 DIAGNOSIS — E875 Hyperkalemia: Secondary | ICD-10-CM | POA: Diagnosis not present

## 2015-12-25 DIAGNOSIS — Z9109 Other allergy status, other than to drugs and biological substances: Secondary | ICD-10-CM | POA: Diagnosis not present

## 2015-12-25 DIAGNOSIS — Z79899 Other long term (current) drug therapy: Secondary | ICD-10-CM | POA: Diagnosis not present

## 2015-12-25 DIAGNOSIS — I4891 Unspecified atrial fibrillation: Secondary | ICD-10-CM | POA: Diagnosis not present

## 2015-12-25 DIAGNOSIS — I5021 Acute systolic (congestive) heart failure: Secondary | ICD-10-CM | POA: Diagnosis not present

## 2015-12-31 ENCOUNTER — Telehealth: Payer: Self-pay | Admitting: Family Medicine

## 2015-12-31 NOTE — Telephone Encounter (Signed)
Patient was discharged from [facility] on Saturday.

## 2015-12-31 NOTE — Telephone Encounter (Signed)
Pt discharged from [facility] over the weekend.  I had a difficult time understanding patient.  Did not have clear voice.  Reinforced need to make sure he is taking only the medication instructed to resume after discharge.  Michela Pitcher has dressing on wound on leg.  Said someone is suppose to be contacting him about home care.  Told him someone from [facility] then should be contacting him today or tomorrow.  Reminded him about follow up appt here Friday to see PCP.  Told to bring his discharge instructions and bring all his pill bottles with him to visit.  Call us if any problems develop before Friday

## 2016-01-02 DIAGNOSIS — I509 Heart failure, unspecified: Secondary | ICD-10-CM | POA: Diagnosis not present

## 2016-01-02 DIAGNOSIS — I110 Hypertensive heart disease with heart failure: Secondary | ICD-10-CM | POA: Diagnosis not present

## 2016-01-02 DIAGNOSIS — I4891 Unspecified atrial fibrillation: Secondary | ICD-10-CM | POA: Diagnosis not present

## 2016-01-02 DIAGNOSIS — N289 Disorder of kidney and ureter, unspecified: Secondary | ICD-10-CM | POA: Diagnosis not present

## 2016-01-03 DIAGNOSIS — I110 Hypertensive heart disease with heart failure: Secondary | ICD-10-CM | POA: Diagnosis not present

## 2016-01-04 ENCOUNTER — Ambulatory Visit (INDEPENDENT_AMBULATORY_CARE_PROVIDER_SITE_OTHER): Payer: Medicare Other | Admitting: Family Medicine

## 2016-01-04 ENCOUNTER — Encounter: Payer: Self-pay | Admitting: *Deleted

## 2016-01-04 ENCOUNTER — Encounter: Payer: Self-pay | Admitting: Family Medicine

## 2016-01-04 VITALS — BP 134/68 | HR 76 | Temp 98.0°F | Resp 14 | Ht 68.0 in | Wt 222.0 lb

## 2016-01-04 DIAGNOSIS — E119 Type 2 diabetes mellitus without complications: Secondary | ICD-10-CM

## 2016-01-04 DIAGNOSIS — Z23 Encounter for immunization: Secondary | ICD-10-CM

## 2016-01-04 DIAGNOSIS — I10 Essential (primary) hypertension: Secondary | ICD-10-CM | POA: Diagnosis not present

## 2016-01-04 DIAGNOSIS — I5041 Acute combined systolic (congestive) and diastolic (congestive) heart failure: Secondary | ICD-10-CM | POA: Diagnosis not present

## 2016-01-04 DIAGNOSIS — I482 Chronic atrial fibrillation, unspecified: Secondary | ICD-10-CM

## 2016-01-04 DIAGNOSIS — E782 Mixed hyperlipidemia: Secondary | ICD-10-CM | POA: Diagnosis not present

## 2016-01-04 LAB — COMPREHENSIVE METABOLIC PANEL
ALK PHOS: 179 U/L — AB (ref 40–115)
ALT: 17 U/L (ref 9–46)
AST: 34 U/L (ref 10–35)
Albumin: 3.3 g/dL — ABNORMAL LOW (ref 3.6–5.1)
BILIRUBIN TOTAL: 2.6 mg/dL — AB (ref 0.2–1.2)
BUN: 40 mg/dL — AB (ref 7–25)
CALCIUM: 8.9 mg/dL (ref 8.6–10.3)
CO2: 22 mmol/L (ref 20–31)
Chloride: 99 mmol/L (ref 98–110)
Creat: 1.51 mg/dL — ABNORMAL HIGH (ref 0.70–1.25)
GLUCOSE: 107 mg/dL — AB (ref 70–99)
Potassium: 5.3 mmol/L (ref 3.5–5.3)
Sodium: 135 mmol/L (ref 135–146)
Total Protein: 6.3 g/dL (ref 6.1–8.1)

## 2016-01-04 LAB — CBC WITH DIFFERENTIAL/PLATELET
BASOS PCT: 0 %
Basophils Absolute: 0 cells/uL (ref 0–200)
Eosinophils Absolute: 84 cells/uL (ref 15–500)
Eosinophils Relative: 1 %
HEMATOCRIT: 49.9 % (ref 38.5–50.0)
Hemoglobin: 15.8 g/dL (ref 13.0–17.0)
LYMPHS ABS: 924 {cells}/uL (ref 850–3900)
LYMPHS PCT: 11 %
MCH: 29 pg (ref 27.0–33.0)
MCHC: 31.7 g/dL — ABNORMAL LOW (ref 32.0–36.0)
MCV: 91.7 fL (ref 80.0–100.0)
MONO ABS: 672 {cells}/uL (ref 200–950)
Monocytes Relative: 8 %
NEUTROS ABS: 6720 {cells}/uL (ref 1500–7800)
Neutrophils Relative %: 80 %
Platelets: 120 10*3/uL — ABNORMAL LOW (ref 140–400)
RBC: 5.44 MIL/uL (ref 4.20–5.80)
RDW: 20.3 % — AB (ref 11.0–15.0)
WBC: 8.4 10*3/uL (ref 3.8–10.8)

## 2016-01-04 LAB — LIPID PANEL
Cholesterol: 124 mg/dL (ref <200)
HDL: 56 mg/dL (ref >40)
LDL Cholesterol: 46 mg/dL (ref <100)
Total CHOL/HDL Ratio: 2.2 Ratio (ref <5.0)
Triglycerides: 112 mg/dL (ref <150)
VLDL: 22 mg/dL (ref <30)

## 2016-01-04 MED ORDER — EDOXABAN TOSYLATE 60 MG PO TABS: 60.0000 mg | ORAL_TABLET | Freq: Every day | ORAL | 11 refills | Status: DC

## 2016-01-04 MED ORDER — DILTIAZEM HCL ER COATED BEADS 360 MG PO CP24: 360.0000 mg | ORAL_CAPSULE | Freq: Every day | ORAL | 11 refills | Status: DC

## 2016-01-04 NOTE — Progress Notes (Signed)
Subjective:    Patient ID: Phillip Wilson, male    DOB: [birth year], 70 y.o.   MRN: [id number]  Patient presents for Hospital F/U (allergic reaction to [facility])  Pt here for hospital f/u, I did not have records during the visit, he told me he was started on Entrestro which was back in Oct, after a few weeks he broke out with rash on his legs, large blisters and his legs filled up with fluid. Denied any chest pain. He was admitted to [facility] for 3 days, now has HH using UNNA boots on his legs to help with his sores. He needed documentation for work  There was also a change in meds, taken off Entresto, and started on Cardizem 360mg     [facility] due to leg swelling/blisters on legs, concern for CHF exaccerbation- diuresed with IV Lasix also  with A fib with RVR, was on cardizem drip therefore transitioned to Cardizem oral. He was taken off Entresto.    Review Of Systems:  GEN-  Fatigue, denies  fever, weight loss,weakness, recent illness HEENT- denies eye drainage, change in vision, nasal discharge, CVS- denies chest pain, palpitations RESP- denies SOB, cough, wheeze ABD- denies N/V, change in stools, abd pain GU- denies dysuria, hematuria, dribbling, incontinence MSK- denies joint pain, muscle aches, injury Neuro- denies headache, dizziness, syncope, seizure activity       Objective:    BP 134/68 (BP Location: Left Arm, Patient Position: Sitting, Cuff Size: Large)   Pulse 76   Temp 98 F (36.7 C) (Oral)   Resp 14   Ht 5\' 8"  (1.727 m)   Wt 222 lb (100.7 kg)   SpO2 92%   BMI 33.75 kg/m  GEN- NAD, alert and oriented x3 HEENT- PERRL, EOMI, non injected sclera, pink conjunctiva, MMM, oropharynx clear Neck- Supple, noJVD  CVS- irregular rhythem, normal rate , no murmur RESP-CTAB ABD-NABS,soft,NT,ND EXT- 2+ edema with few in tact blisters most open superficial wounds Pulses- Radial  2+        Assessment & Plan:       Problem List Items Addressed This Visit    Hyperlipidemia - Primary   Relevant Medications   metoprolol succinate (TOPROL-XL) 12.5 mg TB24 24 hr tablet   furosemide (LASIX) 80 MG tablet   edoxaban (SAVAYSA) 60 MG TABS tablet   diltiazem (CARDIZEM CD) 360 MG 24 hr capsule   Other Relevant Orders   Lipid panel (Completed)   Essential hypertension    Blood pressure looks okay despite his fluid overload Continue cardizem I will obtain labs for renal function, he is on 80mg  of lasix for the CHF and Unna boots re-applied in my office today . He is still on blood thinner for his A fib Obtained urgent visit with cardiology for next week He is not have any cardiorespiratory symptoms but still quite fluid overloaded, per cardiology weigh tof 194lbs in Oct, today at 222lbs      Relevant Medications   metoprolol succinate (TOPROL-XL) 12.5 mg TB24 24 hr tablet   furosemide (LASIX) 80 MG tablet   edoxaban (SAVAYSA) 60 MG TABS tablet   diltiazem (CARDIZEM CD) 360 MG 24 hr capsule   Diabetes mellitus, type 2 (HCC)    Diet controlled       Relevant Medications   edoxaban (SAVAYSA) 60 MG TABS tablet   Other Relevant Orders   CBC with Differential/Platelet (Completed)   Comprehensive metabolic panel (Completed)   Hemoglobin A1c (Completed)   Atrial  fibrillation (HCC)   Relevant Medications   metoprolol succinate (TOPROL-XL) 12.5 mg TB24 24 hr tablet   furosemide (LASIX) 80 MG tablet   edoxaban (SAVAYSA) 60 MG TABS tablet   diltiazem (CARDIZEM CD) 360 MG 24 hr capsule    Other Visit Diagnoses    Need for prophylactic vaccination and inoculation against influenza       Relevant Medications   edoxaban (SAVAYSA) 60 MG TABS tablet      Note: This dictation was prepared with Dragon dictation along with smaller phrase technology. Any transcriptional errors that result from this process are unintentional.

## 2016-01-04 NOTE — Patient Instructions (Signed)
Pick up the cardizem capsule from [facility] We will call with lab results  F/U 2 weeks

## 2016-01-05 LAB — HEMOGLOBIN A1C
HEMOGLOBIN A1C: 6.6 % — AB (ref <5.7)
MEAN PLASMA GLUCOSE: 143 mg/dL

## 2016-01-06 ENCOUNTER — Encounter: Payer: Self-pay | Admitting: Family Medicine

## 2016-01-06 DIAGNOSIS — I5043 Acute on chronic combined systolic (congestive) and diastolic (congestive) heart failure: Secondary | ICD-10-CM | POA: Insufficient documentation

## 2016-01-06 NOTE — Assessment & Plan Note (Signed)
Now rate controlled with cardizem

## 2016-01-06 NOTE — Assessment & Plan Note (Signed)
Blood pressure looks okay despite his fluid overload Continue cardizem I will obtain labs for renal function, he is on 80mg  of lasix for the CHF and Unna boots re-applied in my office today . He is still on blood thinner for his A fib Obtained urgent visit with cardiology for next week He is not have any cardiorespiratory symptoms but still quite fluid overloaded, per cardiology weigh tof 194lbs in Oct, today at 222lbs

## 2016-01-06 NOTE — Assessment & Plan Note (Signed)
Diet controlled.  

## 2016-01-08 ENCOUNTER — Telehealth: Payer: Self-pay | Admitting: Family Medicine

## 2016-01-08 DIAGNOSIS — N289 Disorder of kidney and ureter, unspecified: Secondary | ICD-10-CM | POA: Diagnosis not present

## 2016-01-08 DIAGNOSIS — I4891 Unspecified atrial fibrillation: Secondary | ICD-10-CM | POA: Diagnosis not present

## 2016-01-08 DIAGNOSIS — I509 Heart failure, unspecified: Secondary | ICD-10-CM | POA: Diagnosis not present

## 2016-01-08 DIAGNOSIS — I110 Hypertensive heart disease with heart failure: Secondary | ICD-10-CM | POA: Diagnosis not present

## 2016-01-08 NOTE — Telephone Encounter (Signed)
[place] Nurse at pt home, Heart rate is 100 and irregular, concerned about rate of 100.  Please advise.  Nurse also says he is not on a blood thinner and was concerned about that as well. [facility] was refilled 12/8 but is not in his home???

## 2016-01-08 NOTE — Telephone Encounter (Signed)
Spoke to [facility] nurse.  She will be sure pt taking all meds correctly and noted future appts for her records.

## 2016-01-08 NOTE — Telephone Encounter (Signed)
He needs to get the [facility], he has A fib, he was also suppose to pick up his cardizem 360mg  as well for his rate, likely missing his medications

## 2016-01-09 ENCOUNTER — Telehealth: Payer: Self-pay | Admitting: *Deleted

## 2016-01-09 NOTE — Telephone Encounter (Signed)
noted 

## 2016-01-09 NOTE — Telephone Encounter (Signed)
Received fax from Rondel Jumbo, BSN RN, Arts development officer for [employer] forms.  [phone]~ telephone/ [phone]~ fax.  Call placed to patient for more information.   Job title: Development worker, international aid- Insurance underwriter: clean floors, custodial work  Reason FMLA requested: recent hospitalization- medication reaction- edema- CHF exacerbation  Requested Beginning Date: 12/22/2015 Return to Work Date: 01/29/2016  Verbalized that fee may be charged and is per provider prerogative.   Forms routed to provider.

## 2016-01-11 DIAGNOSIS — I509 Heart failure, unspecified: Secondary | ICD-10-CM | POA: Diagnosis not present

## 2016-01-11 DIAGNOSIS — I4891 Unspecified atrial fibrillation: Secondary | ICD-10-CM | POA: Diagnosis not present

## 2016-01-11 DIAGNOSIS — N289 Disorder of kidney and ureter, unspecified: Secondary | ICD-10-CM | POA: Diagnosis not present

## 2016-01-11 DIAGNOSIS — I110 Hypertensive heart disease with heart failure: Secondary | ICD-10-CM | POA: Diagnosis not present

## 2016-01-14 ENCOUNTER — Telehealth: Payer: Self-pay | Admitting: Cardiovascular Disease

## 2016-01-14 ENCOUNTER — Telehealth: Payer: Self-pay | Admitting: *Deleted

## 2016-01-14 DIAGNOSIS — Z888 Allergy status to other drugs, medicaments and biological substances status: Secondary | ICD-10-CM | POA: Diagnosis not present

## 2016-01-14 DIAGNOSIS — I482 Chronic atrial fibrillation: Secondary | ICD-10-CM | POA: Diagnosis not present

## 2016-01-14 DIAGNOSIS — I4891 Unspecified atrial fibrillation: Secondary | ICD-10-CM | POA: Diagnosis not present

## 2016-01-14 DIAGNOSIS — E785 Hyperlipidemia, unspecified: Secondary | ICD-10-CM | POA: Diagnosis not present

## 2016-01-14 DIAGNOSIS — N183 Chronic kidney disease, stage 3 (moderate): Secondary | ICD-10-CM | POA: Diagnosis not present

## 2016-01-14 DIAGNOSIS — I110 Hypertensive heart disease with heart failure: Secondary | ICD-10-CM | POA: Diagnosis not present

## 2016-01-14 DIAGNOSIS — I509 Heart failure, unspecified: Secondary | ICD-10-CM | POA: Diagnosis not present

## 2016-01-14 DIAGNOSIS — Z79899 Other long term (current) drug therapy: Secondary | ICD-10-CM | POA: Diagnosis not present

## 2016-01-14 DIAGNOSIS — I480 Paroxysmal atrial fibrillation: Secondary | ICD-10-CM | POA: Diagnosis not present

## 2016-01-14 DIAGNOSIS — E1122 Type 2 diabetes mellitus with diabetic chronic kidney disease: Secondary | ICD-10-CM | POA: Diagnosis not present

## 2016-01-14 DIAGNOSIS — J90 Pleural effusion, not elsewhere classified: Secondary | ICD-10-CM | POA: Diagnosis not present

## 2016-01-14 DIAGNOSIS — I130 Hypertensive heart and chronic kidney disease with heart failure and stage 1 through stage 4 chronic kidney disease, or unspecified chronic kidney disease: Secondary | ICD-10-CM | POA: Diagnosis not present

## 2016-01-14 DIAGNOSIS — N289 Disorder of kidney and ureter, unspecified: Secondary | ICD-10-CM | POA: Diagnosis not present

## 2016-01-14 DIAGNOSIS — I5022 Chronic systolic (congestive) heart failure: Secondary | ICD-10-CM | POA: Diagnosis not present

## 2016-01-14 NOTE — Telephone Encounter (Signed)
Call and send pt to ER for CHF and A fib RVR

## 2016-01-14 NOTE — Telephone Encounter (Signed)
Spoke with [facility]. Informed that PCP has instructed pt to be seen in ED.

## 2016-01-14 NOTE — Telephone Encounter (Signed)
Call placed to Phillip Wilson, [facility] SN.   Advised to send patient to ER for eval.   Verbalized understanding.

## 2016-01-14 NOTE — Telephone Encounter (Signed)
Received completed FMLA forms from provider.   No charge per provider.   Faxed to Rondel Jumbo, BSN RN, Arts development officer for [employer] forms.  [phone]~ telephone/ [phone]~ fax.

## 2016-01-14 NOTE — Telephone Encounter (Signed)
-----   Message from Herminio Commons, MD sent at 01/14/2016  1:30 PM EST ----- Regarding: RE: Elevated HR  Increase Toprol-XL to 25 mg bid. Give torsemide 20 mg bid x 3 days. Check BMET in 3 days. Make fu appt with NP this Thurs or Fri.  ----- Message ----- From: Levonne Hubert, LPN Sent: 25/48/6282  11:44 AM To: Herminio Commons, MD Subject: Elevated HR                                    [facility] Verdis Frederickson [phone]) is seeing patient in his home at this time. Patient has had a 4 lb weight gain over the weekend. HR 130, BP 100/60, 2+ edema to LE. Patient c/o chronic SOB. Denies chest pain or pressure. Please advise.

## 2016-01-14 NOTE — Telephone Encounter (Signed)
Received call from Phillip Wilson, [facility] SN with [facility].   Reports that patient HR noted at 130, and weight is 4lbs more.   Advised to notify Cardiology.

## 2016-01-14 NOTE — Telephone Encounter (Signed)
Spoke with Dr. Ronne Binning regarding this patient with severe LV dysfunction (EF 15-20%) presenting with rapid atrial fibrillation. He is anticoagulated with Savaysa. As there is no cardiology coverage at [facility] and they are unable to cardiovert him there, I recommended he be started on an IV amiodarone bolus+infusion and transferred to [facility], where he can be cardioverted to avoid acute decompensated systolic heart failure. Dr. Ronne Binning was in agreement with this strategy.

## 2016-01-16 ENCOUNTER — Encounter: Payer: BLUE CROSS/BLUE SHIELD | Admitting: Physician Assistant

## 2016-01-18 ENCOUNTER — Telehealth: Payer: Self-pay | Admitting: *Deleted

## 2016-01-18 ENCOUNTER — Encounter: Payer: Self-pay | Admitting: *Deleted

## 2016-01-18 ENCOUNTER — Ambulatory Visit: Payer: Medicare Other | Admitting: Family Medicine

## 2016-01-18 DIAGNOSIS — I4891 Unspecified atrial fibrillation: Secondary | ICD-10-CM | POA: Diagnosis not present

## 2016-01-18 DIAGNOSIS — N289 Disorder of kidney and ureter, unspecified: Secondary | ICD-10-CM | POA: Diagnosis not present

## 2016-01-18 DIAGNOSIS — I110 Hypertensive heart disease with heart failure: Secondary | ICD-10-CM | POA: Diagnosis not present

## 2016-01-18 DIAGNOSIS — I509 Heart failure, unspecified: Secondary | ICD-10-CM | POA: Diagnosis not present

## 2016-01-18 NOTE — Telephone Encounter (Signed)
Received call from [place], [facility] SN with [facility].   Reports that patient was released from [facility] on 01/18/2016. States that she was scheduled to go out to home for SN visit, but patient states that he will be out of home Christmas shopping. SN will F/U with patient on 01/19/2016.

## 2016-01-22 ENCOUNTER — Ambulatory Visit (INDEPENDENT_AMBULATORY_CARE_PROVIDER_SITE_OTHER): Payer: BLUE CROSS/BLUE SHIELD | Admitting: Cardiology

## 2016-01-22 ENCOUNTER — Encounter: Payer: Self-pay | Admitting: Cardiology

## 2016-01-22 VITALS — BP 114/75 | HR 107 | Ht 70.0 in | Wt 222.0 lb

## 2016-01-22 DIAGNOSIS — I429 Cardiomyopathy, unspecified: Secondary | ICD-10-CM | POA: Diagnosis not present

## 2016-01-22 DIAGNOSIS — I5043 Acute on chronic combined systolic (congestive) and diastolic (congestive) heart failure: Secondary | ICD-10-CM | POA: Diagnosis not present

## 2016-01-22 DIAGNOSIS — I10 Essential (primary) hypertension: Secondary | ICD-10-CM | POA: Diagnosis not present

## 2016-01-22 DIAGNOSIS — E7800 Pure hypercholesterolemia, unspecified: Secondary | ICD-10-CM

## 2016-01-22 DIAGNOSIS — I4819 Other persistent atrial fibrillation: Secondary | ICD-10-CM

## 2016-01-22 DIAGNOSIS — I481 Persistent atrial fibrillation: Secondary | ICD-10-CM | POA: Diagnosis not present

## 2016-01-22 MED ORDER — AMIODARONE HCL 200 MG PO TABS: 200.0000 mg | ORAL_TABLET | Freq: Every day | ORAL | 6 refills | Status: DC

## 2016-01-22 MED ORDER — FUROSEMIDE 40 MG PO TABS: ORAL_TABLET | ORAL | 6 refills | Status: DC

## 2016-01-22 MED ORDER — METOPROLOL SUCCINATE ER 50 MG PO TB24: 50.0000 mg | ORAL_TABLET | Freq: Two times a day (BID) | ORAL | 6 refills | Status: DC
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
## 2016-01-22 NOTE — Patient Instructions (Addendum)
Medication Instructions:   Stop Diltiazem (Cardizem).   Increase Toprol XL to 50mg  twice a day.  Begin Amiodarone 200mg  daily.  Increase Lasix to 80mg  every morning & 40mg  every evening.  Continue all other medications.    Labwork:  BMET - order given today.  Do just prior to next office visit with Dr. Bronson Ing.   Testing/Procedures: none  Follow-Up: Around January 9 with Dr. Bronson Ing in [place] office.   Any Other Special Instructions Will Be Listed Below (If Applicable). If symptoms worsen, proceed to ED [facility] or [facility]).    If you need a refill on your cardiac medications before your next appointment, please call your pharmacy.

## 2016-01-23 ENCOUNTER — Telehealth: Payer: Self-pay | Admitting: Family Medicine

## 2016-01-23 DIAGNOSIS — S80821A Blister (nonthermal), right lower leg, initial encounter: Secondary | ICD-10-CM

## 2016-01-23 DIAGNOSIS — S80822A Blister (nonthermal), left lower leg, initial encounter: Secondary | ICD-10-CM

## 2016-01-23 NOTE — Telephone Encounter (Signed)
MARIA FROM [facility] CALLING REGARDING HIS NURSE AND THERAPIST VISITS, SHE IS DISCHARGING HIM, NIECE WANTS HIM TO [place], [place] CALL WITH QUESTIONS [phone]

## 2016-01-24 NOTE — Telephone Encounter (Signed)
[facility] RN-Maria reports she had to D/C patient due to him not being at home for visits. Requesting he is set up with the [facility] in [place]. States niece is aware of referral location.

## 2016-01-24 NOTE — Telephone Encounter (Signed)
Okay to send referral, blisters legs

## 2016-01-25 ENCOUNTER — Telehealth: Payer: Self-pay | Admitting: Family Medicine

## 2016-01-25 NOTE — Telephone Encounter (Signed)
Received Short Term Disability forms from [phone]~ telephone/ [phone]~ fax.  Call placed to patient for more information.   Job title: Development worker, international aid- Insurance underwriter: clean floors, custodial work  Reason STDrequested: recent hospitalization- medication reaction- edema- CHF exacerbation  Verbalized that fee may be charged and is per provider prerogative.   Forms routed to provider.

## 2016-01-25 NOTE — Telephone Encounter (Signed)
Routed christina fmla forms for this patient

## 2016-01-25 NOTE — Telephone Encounter (Signed)
Done

## 2016-02-02 NOTE — Telephone Encounter (Signed)
Please call and figure out which dates, I completed FMLA for his first hospilization, need specific dates for this one. The previous FMLA had him out of work until through last week. So I also need to know when he is suppose to return.

## 2016-02-04 ENCOUNTER — Telehealth: Payer: Self-pay | Admitting: Family Medicine

## 2016-02-04 NOTE — Telephone Encounter (Signed)
Call placed to patient. No answer. No VM.  

## 2016-02-04 NOTE — Telephone Encounter (Signed)
fmla forms routed to christina on 02/14/16

## 2016-02-04 NOTE — Telephone Encounter (Signed)
Duplicate forms.

## 2016-02-05 ENCOUNTER — Emergency Department (HOSPITAL_COMMUNITY): Payer: BLUE CROSS/BLUE SHIELD

## 2016-02-05 ENCOUNTER — Ambulatory Visit (INDEPENDENT_AMBULATORY_CARE_PROVIDER_SITE_OTHER): Payer: BLUE CROSS/BLUE SHIELD | Admitting: Cardiovascular Disease

## 2016-02-05 ENCOUNTER — Inpatient Hospital Stay (HOSPITAL_COMMUNITY)
Admission: EM | Admit: 2016-02-05 | Discharge: 2016-02-12 | DRG: 291 | Disposition: A | Discharge status: Home / Self Care | Payer: BLUE CROSS/BLUE SHIELD | Attending: Internal Medicine | Admitting: Internal Medicine

## 2016-02-05 ENCOUNTER — Encounter (HOSPITAL_COMMUNITY): Payer: Self-pay

## 2016-02-05 VITALS — BP 138/82 | HR 94 | Ht 68.0 in | Wt 236.0 lb

## 2016-02-05 DIAGNOSIS — R0602 Shortness of breath: Secondary | ICD-10-CM | POA: Diagnosis not present

## 2016-02-05 DIAGNOSIS — I480 Paroxysmal atrial fibrillation: Secondary | ICD-10-CM | POA: Diagnosis present

## 2016-02-05 DIAGNOSIS — E875 Hyperkalemia: Secondary | ICD-10-CM | POA: Diagnosis not present

## 2016-02-05 DIAGNOSIS — Z825 Family history of asthma and other chronic lower respiratory diseases: Secondary | ICD-10-CM

## 2016-02-05 DIAGNOSIS — I509 Heart failure, unspecified: Secondary | ICD-10-CM

## 2016-02-05 DIAGNOSIS — Z833 Family history of diabetes mellitus: Secondary | ICD-10-CM | POA: Diagnosis not present

## 2016-02-05 DIAGNOSIS — D696 Thrombocytopenia, unspecified: Secondary | ICD-10-CM | POA: Diagnosis not present

## 2016-02-05 DIAGNOSIS — Z8249 Family history of ischemic heart disease and other diseases of the circulatory system: Secondary | ICD-10-CM

## 2016-02-05 DIAGNOSIS — M1712 Unilateral primary osteoarthritis, left knee: Secondary | ICD-10-CM | POA: Diagnosis not present

## 2016-02-05 DIAGNOSIS — I428 Other cardiomyopathies: Secondary | ICD-10-CM | POA: Diagnosis not present

## 2016-02-05 DIAGNOSIS — I5043 Acute on chronic combined systolic (congestive) and diastolic (congestive) heart failure: Secondary | ICD-10-CM | POA: Diagnosis present

## 2016-02-05 DIAGNOSIS — I130 Hypertensive heart and chronic kidney disease with heart failure and stage 1 through stage 4 chronic kidney disease, or unspecified chronic kidney disease: Secondary | ICD-10-CM | POA: Diagnosis not present

## 2016-02-05 DIAGNOSIS — I4891 Unspecified atrial fibrillation: Secondary | ICD-10-CM

## 2016-02-05 DIAGNOSIS — E785 Hyperlipidemia, unspecified: Secondary | ICD-10-CM | POA: Diagnosis not present

## 2016-02-05 DIAGNOSIS — I481 Persistent atrial fibrillation: Secondary | ICD-10-CM | POA: Diagnosis not present

## 2016-02-05 DIAGNOSIS — I10 Essential (primary) hypertension: Secondary | ICD-10-CM

## 2016-02-05 DIAGNOSIS — E876 Hypokalemia: Secondary | ICD-10-CM | POA: Diagnosis present

## 2016-02-05 DIAGNOSIS — N182 Chronic kidney disease, stage 2 (mild): Secondary | ICD-10-CM | POA: Diagnosis present

## 2016-02-05 DIAGNOSIS — Z888 Allergy status to other drugs, medicaments and biological substances status: Secondary | ICD-10-CM

## 2016-02-05 DIAGNOSIS — I4819 Other persistent atrial fibrillation: Secondary | ICD-10-CM | POA: Insufficient documentation

## 2016-02-05 DIAGNOSIS — R600 Localized edema: Secondary | ICD-10-CM

## 2016-02-05 DIAGNOSIS — N179 Acute kidney failure, unspecified: Secondary | ICD-10-CM | POA: Diagnosis not present

## 2016-02-05 DIAGNOSIS — K219 Gastro-esophageal reflux disease without esophagitis: Secondary | ICD-10-CM | POA: Diagnosis not present

## 2016-02-05 DIAGNOSIS — I429 Cardiomyopathy, unspecified: Secondary | ICD-10-CM | POA: Diagnosis not present

## 2016-02-05 DIAGNOSIS — Z23 Encounter for immunization: Secondary | ICD-10-CM | POA: Diagnosis not present

## 2016-02-05 DIAGNOSIS — M6281 Muscle weakness (generalized): Secondary | ICD-10-CM

## 2016-02-05 DIAGNOSIS — E1122 Type 2 diabetes mellitus with diabetic chronic kidney disease: Secondary | ICD-10-CM | POA: Diagnosis present

## 2016-02-05 DIAGNOSIS — E7800 Pure hypercholesterolemia, unspecified: Secondary | ICD-10-CM

## 2016-02-05 DIAGNOSIS — I110 Hypertensive heart disease with heart failure: Secondary | ICD-10-CM | POA: Diagnosis not present

## 2016-02-05 DIAGNOSIS — I420 Dilated cardiomyopathy: Secondary | ICD-10-CM

## 2016-02-05 DIAGNOSIS — N183 Chronic kidney disease, stage 3 (moderate): Secondary | ICD-10-CM | POA: Diagnosis not present

## 2016-02-05 HISTORY — DX: Personal history of other diseases of the digestive system: Z87.19

## 2016-02-05 HISTORY — DX: Heart failure, unspecified: I50.9

## 2016-02-05 LAB — CBC
HEMATOCRIT: 42.4 % (ref 39.0–52.0)
HEMOGLOBIN: 13.8 g/dL (ref 13.0–17.0)
MCH: 29.2 pg (ref 26.0–34.0)
MCHC: 32.5 g/dL (ref 30.0–36.0)
MCV: 89.8 fL (ref 78.0–100.0)
Platelets: 104 10*3/uL — ABNORMAL LOW (ref 150–400)
RBC: 4.72 MIL/uL (ref 4.22–5.81)
RDW: 21 % — ABNORMAL HIGH (ref 11.5–15.5)
WBC: 9.4 10*3/uL (ref 4.0–10.5)

## 2016-02-05 LAB — BRAIN NATRIURETIC PEPTIDE: B Natriuretic Peptide: 3339 pg/mL — ABNORMAL HIGH (ref 0.0–100.0)

## 2016-02-05 LAB — BASIC METABOLIC PANEL
ANION GAP: 10 (ref 5–15)
BUN: 35 mg/dL — ABNORMAL HIGH (ref 6–20)
CHLORIDE: 96 mmol/L — AB (ref 101–111)
CO2: 31 mmol/L (ref 22–32)
CREATININE: 1.42 mg/dL — AB (ref 0.61–1.24)
Calcium: 8.7 mg/dL — ABNORMAL LOW (ref 8.9–10.3)
GFR calc non Af Amer: 49 mL/min — ABNORMAL LOW (ref >60)
GFR, EST AFRICAN AMERICAN: 57 mL/min — AB (ref >60)
Glucose, Bld: 103 mg/dL — ABNORMAL HIGH (ref 65–99)
POTASSIUM: 3.3 mmol/L — AB (ref 3.5–5.1)
SODIUM: 137 mmol/L (ref 135–145)

## 2016-02-05 MED ORDER — MAGNESIUM SULFATE 2 GM/50ML IV SOLN
2.0000 g | Freq: Once | INTRAVENOUS | Status: AC
  Administered 2016-02-05: 2 g via INTRAVENOUS
  Filled 2016-02-05: qty 50

## 2016-02-05 MED ORDER — ONDANSETRON HCL 4 MG/2ML IJ SOLN: 4.0000 mg | Freq: Four times a day (QID) | INTRAMUSCULAR | Status: DC | PRN

## 2016-02-05 MED ORDER — SODIUM CHLORIDE 0.9 % IV SOLN: 250.0000 mL | INTRAVENOUS | Status: DC | PRN

## 2016-02-05 MED ORDER — SODIUM CHLORIDE 0.9% FLUSH
3.0000 mL | Freq: Two times a day (BID) | INTRAVENOUS | Status: DC
  Administered 2016-02-06 – 2016-02-12 (×11): 3 mL via INTRAVENOUS

## 2016-02-05 MED ORDER — AMIODARONE HCL IN DEXTROSE 360-4.14 MG/200ML-% IV SOLN
30.0000 mg/h | INTRAVENOUS | Status: DC
  Administered 2016-02-06 – 2016-02-08 (×4): 30 mg/h via INTRAVENOUS
  Filled 2016-02-05 (×6): qty 200

## 2016-02-05 MED ORDER — DIGOXIN 125 MCG PO TABS
250.0000 ug | ORAL_TABLET | Freq: Every day | ORAL | Status: DC
  Administered 2016-02-06 – 2016-02-10 (×5): 250 ug via ORAL
  Filled 2016-02-05 (×7): qty 2
  Filled 2016-02-05: qty 1

## 2016-02-05 MED ORDER — POTASSIUM CHLORIDE CRYS ER 20 MEQ PO TBCR: 40.0000 meq | EXTENDED_RELEASE_TABLET | Freq: Three times a day (TID) | ORAL | Status: DC

## 2016-02-05 MED ORDER — POTASSIUM CHLORIDE CRYS ER 20 MEQ PO TBCR
40.0000 meq | EXTENDED_RELEASE_TABLET | Freq: Three times a day (TID) | ORAL | Status: DC
  Administered 2016-02-05 – 2016-02-08 (×8): 40 meq via ORAL
  Filled 2016-02-05 (×9): qty 2

## 2016-02-05 MED ORDER — METOPROLOL SUCCINATE ER 50 MG PO TB24
50.0000 mg | ORAL_TABLET | Freq: Two times a day (BID) | ORAL | Status: DC
  Administered 2016-02-06 – 2016-02-10 (×9): 50 mg via ORAL
  Filled 2016-02-05 (×15): qty 1

## 2016-02-05 MED ORDER — FUROSEMIDE 10 MG/ML IJ SOLN
60.0000 mg | Freq: Two times a day (BID) | INTRAMUSCULAR | Status: DC
  Administered 2016-02-05 – 2016-02-07 (×4): 60 mg via INTRAVENOUS
  Filled 2016-02-05 (×4): qty 6

## 2016-02-05 MED ORDER — POTASSIUM CHLORIDE CRYS ER 20 MEQ PO TBCR: 40.0000 meq | EXTENDED_RELEASE_TABLET | Freq: Two times a day (BID) | ORAL | Status: DC

## 2016-02-05 MED ORDER — SODIUM CHLORIDE 0.9% FLUSH: 3.0000 mL | INTRAVENOUS | Status: DC | PRN

## 2016-02-05 MED ORDER — AMIODARONE LOAD VIA INFUSION
150.0000 mg | Freq: Once | INTRAVENOUS | Status: AC
  Administered 2016-02-05: 150 mg via INTRAVENOUS

## 2016-02-05 MED ORDER — EDOXABAN TOSYLATE 60 MG PO TABS
60.0000 mg | ORAL_TABLET | Freq: Every day | ORAL | Status: DC
  Administered 2016-02-06 – 2016-02-12 (×7): 60 mg via ORAL
  Filled 2016-02-05 (×8): qty 60

## 2016-02-05 MED ORDER — AMIODARONE HCL IN DEXTROSE 360-4.14 MG/200ML-% IV SOLN
60.0000 mg/h | INTRAVENOUS | Status: AC
  Administered 2016-02-05 – 2016-02-06 (×2): 60 mg/h via INTRAVENOUS
  Filled 2016-02-05: qty 200

## 2016-02-05 MED ORDER — ACETAMINOPHEN 325 MG PO TABS: 650.0000 mg | ORAL_TABLET | ORAL | Status: DC | PRN

## 2016-02-05 MED ORDER — ATORVASTATIN CALCIUM 40 MG PO TABS
40.0000 mg | ORAL_TABLET | Freq: Every day | ORAL | Status: DC
  Administered 2016-02-06 – 2016-02-12 (×7): 40 mg via ORAL
  Filled 2016-02-05 (×9): qty 1

## 2016-02-05 MED ORDER — ALPRAZOLAM 0.25 MG PO TABS
0.2500 mg | ORAL_TABLET | Freq: Two times a day (BID) | ORAL | Status: DC | PRN
  Administered 2016-02-06: 0.25 mg via ORAL
  Filled 2016-02-05: qty 1

## 2016-02-05 NOTE — Patient Instructions (Signed)
Medication Instructions:  Your physician recommends that you continue on your current medications as directed. Please refer to the Current Medication list given to you today.   Labwork: Your physician recommends that you return for lab work in:    Testing/Procedures: none  Follow-Up: Your physician recommends that you schedule a follow-up appointment in: [place] THIS EVENING AS SOON AS YOU PICK YOUR DAUGHTER UP FROM WORK.    Any Other Special Instructions Will Be Listed Below (If Applicable).     If you need a refill on your cardiac medications before your next appointment, please call your pharmacy.
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
## 2016-02-05 NOTE — Telephone Encounter (Signed)
Call placed to patient. No answer. No VM.  
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
## 2016-02-05 NOTE — ED Triage Notes (Signed)
I have fluid in my legs and I am here to have fluid drawn off of my legs per pt.  Cardiologist wanted me to come to the ER tonight due to the fluid in my legs.

## 2016-02-06 ENCOUNTER — Encounter (HOSPITAL_COMMUNITY): Payer: Self-pay

## 2016-02-06 DIAGNOSIS — I5043 Acute on chronic combined systolic (congestive) and diastolic (congestive) heart failure: Secondary | ICD-10-CM

## 2016-02-06 DIAGNOSIS — I4891 Unspecified atrial fibrillation: Secondary | ICD-10-CM

## 2016-02-06 DIAGNOSIS — D696 Thrombocytopenia, unspecified: Secondary | ICD-10-CM

## 2016-02-06 DIAGNOSIS — I10 Essential (primary) hypertension: Secondary | ICD-10-CM

## 2016-02-06 DIAGNOSIS — I519 Heart disease, unspecified: Secondary | ICD-10-CM

## 2016-02-06 DIAGNOSIS — N183 Chronic kidney disease, stage 3 (moderate): Secondary | ICD-10-CM

## 2016-02-06 DIAGNOSIS — E876 Hypokalemia: Secondary | ICD-10-CM

## 2016-02-06 LAB — BLOOD GAS, ARTERIAL
Acid-Base Excess: 0.5 mmol/L (ref 0.0–2.0)
Bicarbonate: 25 mmol/L (ref 20.0–28.0)
Drawn by: 21310
O2 Content: 10 L/min
O2 Saturation: 99.2 %
Patient temperature: 37
pCO2 arterial: 38.5 mmHg (ref 32.0–48.0)
pH, Arterial: 7.419 (ref 7.350–7.450)
pO2, Arterial: 202 mmHg — ABNORMAL HIGH (ref 83.0–108.0)

## 2016-02-06 LAB — BASIC METABOLIC PANEL
Anion gap: 11 (ref 5–15)
BUN: 35 mg/dL — ABNORMAL HIGH (ref 6–20)
CHLORIDE: 99 mmol/L — AB (ref 101–111)
CO2: 28 mmol/L (ref 22–32)
Calcium: 8.5 mg/dL — ABNORMAL LOW (ref 8.9–10.3)
Creatinine, Ser: 1.38 mg/dL — ABNORMAL HIGH (ref 0.61–1.24)
GFR calc non Af Amer: 51 mL/min — ABNORMAL LOW (ref >60)
GFR, EST AFRICAN AMERICAN: 59 mL/min — AB (ref >60)
Glucose, Bld: 118 mg/dL — ABNORMAL HIGH (ref 65–99)
Potassium: 3.7 mmol/L (ref 3.5–5.1)
SODIUM: 138 mmol/L (ref 135–145)

## 2016-02-06 LAB — GLUCOSE, CAPILLARY
GLUCOSE-CAPILLARY: 130 mg/dL — AB (ref 65–99)
Glucose-Capillary: 166 mg/dL — ABNORMAL HIGH (ref 65–99)

## 2016-02-06 LAB — MAGNESIUM: MAGNESIUM: 2.2 mg/dL (ref 1.7–2.4)

## 2016-02-06 LAB — SURGICAL PCR SCREEN
MRSA, PCR: NEGATIVE
Staphylococcus aureus: NEGATIVE

## 2016-02-06 MED ORDER — LORAZEPAM 2 MG/ML IJ SOLN
1.0000 mg | Freq: Once | INTRAMUSCULAR | Status: AC
  Administered 2016-02-06: 1 mg via INTRAVENOUS
  Filled 2016-02-06: qty 1

## 2016-02-06 MED ORDER — METOPROLOL SUCCINATE ER 50 MG PO TB24
50.0000 mg | ORAL_TABLET | Freq: Once | ORAL | Status: DC
  Filled 2016-02-06: qty 1

## 2016-02-06 MED ORDER — LORAZEPAM 2 MG/ML PO CONC: 1.0000 mg | Freq: Four times a day (QID) | ORAL | Status: DC | PRN

## 2016-02-06 MED ORDER — ALBUTEROL SULFATE (2.5 MG/3ML) 0.083% IN NEBU: 5.0000 mg | INHALATION_SOLUTION | RESPIRATORY_TRACT | Status: DC | PRN

## 2016-02-06 MED ORDER — PNEUMOCOCCAL VAC POLYVALENT 25 MCG/0.5ML IJ INJ
0.5000 mL | INJECTION | INTRAMUSCULAR | Status: AC
  Administered 2016-02-07: 0.5 mL via INTRAMUSCULAR
  Filled 2016-02-06: qty 0.5

## 2016-02-06 NOTE — Progress Notes (Signed)
One time dose Metoprolol held at this time for SBP 90s, last B/P  95/70 (80), HR is now low 100s on Amio gtt. Will continue to monitor

## 2016-02-06 NOTE — Progress Notes (Signed)
Pt continuously trying to get out of bed stating he cannot breathe and he is hot. Keeps removing his oxygen. Window opened per pt request. Respiratory and midlevel both called. New order for ativan received and pt placed on 10LPM hiflo [place]

## 2016-02-06 NOTE — Consult Note (Signed)
[facility] Nurse wound consult note Reason for Consult:CHF exacerbation resulting in pitting edema bilateral lower extremity.  Ruptured blistering to left anterior lower leg. Will provide topical therapy and short term compression to manage edema.  Patient and nurse educated to let nursing staff know if he becomes increasingly short of breath and wraps will then be removed.   Wound type:CHf exacerbation with edema Pressure Injury POA: N/A Measurement:Right lower leg:  Distal wound 5 cm x 6 cm x 0.1 cm ruptured serum filled blister.  Proximal wound 4 cm x 2 cm x 0.2 cm  Right leg, just below knee with 0.5 cm x 1 cm x 0.1 cm ruptured serum filled blister.   Wound DJM:EQAS and moist Drainage (amount, consistency, odor) Moderate serous weeping.  Periwound:Erythema and edema Dressing procedure/placement/frequency:Cleanse bilateral lower legs with soap and water.  Apply Aquacel Ag to ruptured blisters Kellie Simmering # [phone]).  Wrap with zinc layer from just below toes to below knee.  Secure with self adherent Coban wrap.  Change twice weekly.  WIll re-assess Friday.  Once edema managed, will discontinue compression and only provide topical care. [place] team will follow.  Domenic Moras RN BSN [place] Pager [phone]
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
## 2016-02-06 NOTE — ED Notes (Signed)
Holding lasix, metoprolol, and digoxin until talked with admitting doctor.

## 2016-02-07 ENCOUNTER — Inpatient Hospital Stay (HOSPITAL_COMMUNITY): Payer: BLUE CROSS/BLUE SHIELD

## 2016-02-07 DIAGNOSIS — J90 Pleural effusion, not elsewhere classified: Secondary | ICD-10-CM

## 2016-02-07 LAB — GLUCOSE, CAPILLARY
GLUCOSE-CAPILLARY: 119 mg/dL — AB (ref 65–99)
Glucose-Capillary: 128 mg/dL — ABNORMAL HIGH (ref 65–99)
Glucose-Capillary: 152 mg/dL — ABNORMAL HIGH (ref 65–99)
Glucose-Capillary: 147 mg/dL — ABNORMAL HIGH (ref 65–99)

## 2016-02-07 LAB — BLOOD GAS, ARTERIAL
ACID-BASE EXCESS: 3.9 mmol/L — AB (ref 0.0–2.0)
BICARBONATE: 28 mmol/L (ref 20.0–28.0)
DRAWN BY: 23534
O2 Content: 6 L/min
O2 SAT: 98.9 %
PCO2 ART: 38.9 mmHg (ref 32.0–48.0)
pH, Arterial: 7.464 — ABNORMAL HIGH (ref 7.350–7.450)
pO2, Arterial: 149 mmHg — ABNORMAL HIGH (ref 83.0–108.0)

## 2016-02-07 LAB — BASIC METABOLIC PANEL
ANION GAP: 10 (ref 5–15)
BUN: 41 mg/dL — ABNORMAL HIGH (ref 6–20)
CALCIUM: 8.4 mg/dL — AB (ref 8.9–10.3)
CHLORIDE: 100 mmol/L — AB (ref 101–111)
CO2: 27 mmol/L (ref 22–32)
Creatinine, Ser: 1.57 mg/dL — ABNORMAL HIGH (ref 0.61–1.24)
GFR calc non Af Amer: 44 mL/min — ABNORMAL LOW (ref >60)
GFR, EST AFRICAN AMERICAN: 51 mL/min — AB (ref >60)
Glucose, Bld: 135 mg/dL — ABNORMAL HIGH (ref 65–99)
Potassium: 4.9 mmol/L (ref 3.5–5.1)
Sodium: 137 mmol/L (ref 135–145)

## 2016-02-07 MED ORDER — FUROSEMIDE 10 MG/ML IJ SOLN
5.0000 mg/h | INTRAMUSCULAR | Status: DC
  Administered 2016-02-07: 10 mg/h via INTRAVENOUS
  Administered 2016-02-08: 7.5 mg/h via INTRAVENOUS
  Administered 2016-02-10: 5 mg/h via INTRAVENOUS
  Filled 2016-02-07 (×5): qty 25

## 2016-02-07 MED ORDER — LORAZEPAM 2 MG/ML IJ SOLN
1.0000 mg | Freq: Four times a day (QID) | INTRAMUSCULAR | Status: DC | PRN
  Administered 2016-02-07 – 2016-02-08 (×2): 1 mg via INTRAVENOUS
  Filled 2016-02-07 (×2): qty 1

## 2016-02-07 MED ORDER — METOLAZONE 5 MG PO TABS
5.0000 mg | ORAL_TABLET | Freq: Once | ORAL | Status: AC
  Administered 2016-02-07: 5 mg via ORAL
  Filled 2016-02-07: qty 1

## 2016-02-07 MED ORDER — FUROSEMIDE 10 MG/ML IJ SOLN: 60.0000 mg | Freq: Three times a day (TID) | INTRAMUSCULAR | Status: DC
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
## 2016-02-07 NOTE — Telephone Encounter (Signed)
Received completed STD forms from provider.   No charge per provider.   Faxed to [employer].
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
## 2016-02-08 DIAGNOSIS — E875 Hyperkalemia: Secondary | ICD-10-CM

## 2016-02-08 LAB — BASIC METABOLIC PANEL
Anion gap: 12 (ref 5–15)
BUN: 45 mg/dL — ABNORMAL HIGH (ref 6–20)
CALCIUM: 8.5 mg/dL — AB (ref 8.9–10.3)
CO2: 25 mmol/L (ref 22–32)
Chloride: 100 mmol/L — ABNORMAL LOW (ref 101–111)
Creatinine, Ser: 1.71 mg/dL — ABNORMAL HIGH (ref 0.61–1.24)
GFR calc Af Amer: 46 mL/min — ABNORMAL LOW (ref >60)
GFR, EST NON AFRICAN AMERICAN: 39 mL/min — AB (ref >60)
Glucose, Bld: 138 mg/dL — ABNORMAL HIGH (ref 65–99)
Potassium: 5.4 mmol/L — ABNORMAL HIGH (ref 3.5–5.1)
SODIUM: 137 mmol/L (ref 135–145)

## 2016-02-08 LAB — GLUCOSE, CAPILLARY
GLUCOSE-CAPILLARY: 119 mg/dL — AB (ref 65–99)
GLUCOSE-CAPILLARY: 126 mg/dL — AB (ref 65–99)
GLUCOSE-CAPILLARY: 154 mg/dL — AB (ref 65–99)
Glucose-Capillary: 109 mg/dL — ABNORMAL HIGH (ref 65–99)

## 2016-02-08 MED ORDER — AMIODARONE HCL 200 MG PO TABS
200.0000 mg | ORAL_TABLET | Freq: Every day | ORAL | Status: DC
  Administered 2016-02-09 – 2016-02-10 (×2): 200 mg via ORAL
  Filled 2016-02-08 (×3): qty 1

## 2016-02-08 MED ORDER — AMIODARONE HCL 200 MG PO TABS
400.0000 mg | ORAL_TABLET | Freq: Two times a day (BID) | ORAL | Status: DC
  Administered 2016-02-08: 400 mg via ORAL
  Filled 2016-02-08: qty 2
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
## 2016-02-08 NOTE — Consult Note (Signed)
[place] Nurse wound follow up Edema remains, has not diuresed well per MD notes.  Compression wraps are intact.  Will keep them in place.  Remains in ICU.  Will re-evaluate Monday and remove.  No changes at this time.  [place] team will follow.  Domenic Moras RN BSN [place] Pager [phone]
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
## 2016-02-09 LAB — BASIC METABOLIC PANEL
ANION GAP: 11 (ref 5–15)
BUN: 43 mg/dL — AB (ref 6–20)
CO2: 31 mmol/L (ref 22–32)
Calcium: 8.5 mg/dL — ABNORMAL LOW (ref 8.9–10.3)
Chloride: 96 mmol/L — ABNORMAL LOW (ref 101–111)
Creatinine, Ser: 1.72 mg/dL — ABNORMAL HIGH (ref 0.61–1.24)
GFR calc Af Amer: 45 mL/min — ABNORMAL LOW (ref >60)
GFR, EST NON AFRICAN AMERICAN: 39 mL/min — AB (ref >60)
GLUCOSE: 105 mg/dL — AB (ref 65–99)
POTASSIUM: 4.5 mmol/L (ref 3.5–5.1)
Sodium: 138 mmol/L (ref 135–145)

## 2016-02-09 LAB — CBC
HEMATOCRIT: 49.2 % (ref 39.0–52.0)
HEMOGLOBIN: 16.1 g/dL (ref 13.0–17.0)
MCH: 29.4 pg (ref 26.0–34.0)
MCHC: 32.7 g/dL (ref 30.0–36.0)
MCV: 89.8 fL (ref 78.0–100.0)
Platelets: 126 10*3/uL — ABNORMAL LOW (ref 150–400)
RBC: 5.48 MIL/uL (ref 4.22–5.81)
RDW: 21.1 % — AB (ref 11.5–15.5)
WBC: 10.2 10*3/uL (ref 4.0–10.5)

## 2016-02-09 LAB — GLUCOSE, CAPILLARY
GLUCOSE-CAPILLARY: 101 mg/dL — AB (ref 65–99)
GLUCOSE-CAPILLARY: 141 mg/dL — AB (ref 65–99)
Glucose-Capillary: 95 mg/dL (ref 65–99)
Glucose-Capillary: 103 mg/dL — ABNORMAL HIGH (ref 65–99)

## 2016-02-09 NOTE — Progress Notes (Signed)
PROGRESS NOTE    OTHER ATIENZA  RCV:[id number] DOB: 08-17-1945 DOA: 02/05/2016 PCP: Vic Blackbird, MD     Brief Narrative:  71 year old man admitted to the hospital from the cardiology clinic on 1/9 with complaints of bilateral leg edema and dyspnea. Also found to be in A. fib with RVR and admitted for further management and treatment.   Assessment & Plan:   Principal Problem:   Acute on chronic combined systolic and diastolic CHF (congestive heart failure) (HCC) Active Problems:   Essential hypertension   Atrial fibrillation with RVR (HCC)   Thrombocytopenia (HCC)   Gastroesophageal reflux disease   Hypokalemia   CKD (chronic kidney disease), stage II   Acute on chronic combined CHF -Echo from June 2017 with ejection fraction of 15-20%. -Volume status improved, will further decrease Lasix drip to 5 mg an hour. -Follow intake and output and strive for negative fluid balance. He is at least 3.6 L negative, although suspect some error in documentation as clinically he appears to have lost significantly more. -Still with significant volume overload.  A. fib with RVR -Rate controlled on oral amiodarone and Toprol-XL. -Anticoagulated on saveysa.  Chronic kidney disease stage II -Creatinine little above baseline of 1.4 at 1.71 today, anticipate some worsening with diuresis.  Hypokalemia/Hyperkalemia -Replaced -Magnesium normal at 2.2 -Is now hyperkalemic, will discontinue potassium supplementation and follow potassium closely. -And 1/13, potassium is 4.5, not currently receiving supplementation.  Thrombocytopenia -Stable at 126   DVT prophylaxis: Saveysa Code Status: Full code Family Communication: patient only Disposition Plan: keep in ICU as he remains on Lasix drip  Consultants:   Cardiology  Procedures:   None  Antimicrobials:  Anti-infectives    None       Subjective: Feels less short of breath, still markedly volume overloaded, although  improved in the past 24 hours.  Objective: Vitals:   02/09/16 1213 02/09/16 1400 02/09/16 1600 02/09/16 1800  BP: 114/62 104/83 90/60 (!) 116/91  Pulse: 92 98 88 88  Resp: 16 18 16 12   Temp: 97.5 F (36.4 C)  97.7 F (36.5 C)   TempSrc: Axillary  Oral   SpO2: 98% 96% 95% 100%  Weight:      Height:        Intake/Output Summary (Last 24 hours) at 02/09/16 1850 Last data filed at 02/09/16 1800  Gross per 24 hour  Intake          1246.88 ml  Output             3290 ml  Net         -2043.12 ml   Filed Weights   02/07/16 0445 02/08/16 0623 02/09/16 0400  Weight: 106.4 kg (234 lb 9.1 oz) 106.3 kg (234 lb 5.6 oz) 101.2 kg (223 lb 1.7 oz)    Examination:  General exam: Alert, awake, oriented x 3 Respiratory system: Clear to auscultation. Respiratory effort normal. Cardiovascular system:RRR. No murmurs, rubs, gallops. Gastrointestinal system: Abdomen is nondistended, soft and nontender. No organomegaly or masses felt. Normal bowel sounds heard. Central nervous system: Alert and oriented. No focal neurological deficits. Extremities: 4+ edema up to the hips bilaterally, +pedal pulses Skin: No rashes, lesions or ulcers Psychiatry: Judgement and insight appear normal. Mood & affect appropriate.     Data Reviewed: I have personally reviewed following labs and imaging studies  CBC:  Recent Labs Lab 02/05/16 2030 02/09/16 0516  WBC 9.4 10.2  HGB 13.8 16.1  HCT 42.4 49.2  MCV 89.8  89.8  PLT 104* 277*   Basic Metabolic Panel:  Recent Labs Lab 02/05/16 2030 02/06/16 0503 02/07/16 0449 02/08/16 0445 02/09/16 0516  NA 137 138 137 137 138  K 3.3* 3.7 4.9 5.4* 4.5  CL 96* 99* 100* 100* 96*  CO2 31 28 27 25 31   GLUCOSE 103* 118* 135* 138* 105*  BUN 35* 35* 41* 45* 43*  CREATININE 1.42* 1.38* 1.57* 1.71* 1.72*  CALCIUM 8.7* 8.5* 8.4* 8.5* 8.5*  MG  --  2.2  --   --   --    GFR: Estimated Creatinine Clearance: 47.4 mL/min (by C-G formula based on SCr of 1.72 mg/dL  (H)). Liver Function Tests: No results for input(s): AST, ALT, ALKPHOS, BILITOT, PROT, ALBUMIN in the last 168 hours. No results for input(s): LIPASE, AMYLASE in the last 168 hours. No results for input(s): AMMONIA in the last 168 hours. Coagulation Profile: No results for input(s): INR, PROTIME in the last 168 hours. Cardiac Enzymes: No results for input(s): CKTOTAL, CKMB, CKMBINDEX, TROPONINI in the last 168 hours. BNP (last 3 results) No results for input(s): PROBNP in the last 8760 hours. HbA1C: No results for input(s): HGBA1C in the last 72 hours. CBG:  Recent Labs Lab 02/08/16 1738 02/08/16 2147 02/09/16 0821 02/09/16 1202 02/09/16 1550  GLUCAP 154* 109* 95 103* 101*   Lipid Profile: No results for input(s): CHOL, HDL, LDLCALC, TRIG, CHOLHDL, LDLDIRECT in the last 72 hours. Thyroid Function Tests: No results for input(s): TSH, T4TOTAL, FREET4, T3FREE, THYROIDAB in the last 72 hours. Anemia Panel: No results for input(s): VITAMINB12, FOLATE, FERRITIN, TIBC, IRON, RETICCTPCT in the last 72 hours. Urine analysis:    Component Value Date/Time   COLORURINE YELLOW 01/21/2011 [place] 01/21/2011 1246   LABSPEC >1.030 (H) 01/21/2011 1246   PHURINE 6.0 01/21/2011 1246   [place] 01/21/2011 1246   HGBUR NEGATIVE 01/21/2011 1246   HGBUR negative 02/14/2008 1349   BILIRUBINUR NEGATIVE 01/21/2011 1246   KETONESUR 40 (A) 01/21/2011 1246   PROTEINUR 100 (A) 01/21/2011 1246   UROBILINOGEN 1.0 01/21/2011 1246   NITRITE NEGATIVE 01/21/2011 1246   LEUKOCYTESUR NEGATIVE 01/21/2011 1246   Sepsis Labs: @LABRCNTIP (procalcitonin:4,lacticidven:4)  ) Recent Results (from the past 240 hour(s))  Surgical pcr screen     Status: None   Collection Time: 02/06/16  1:09 PM  Result Value Ref Range Status   MRSA, PCR NEGATIVE NEGATIVE Final   Staphylococcus aureus NEGATIVE NEGATIVE Final    Comment:        The Xpert SA Assay (FDA approved for NASAL  specimens in patients over 52 years of age), is one component of a comprehensive surveillance program.  Test performance has been validated by [facility] for patients greater than or equal to 47 year old. It is not intended to diagnose infection nor to guide or monitor treatment.          Radiology Studies: No results found.      Scheduled Meds: . amiodarone  200 mg Oral Daily  . atorvastatin  40 mg Oral Daily  . digoxin  250 mcg Oral Daily  . edoxaban  60 mg Oral Daily  . metoprolol succinate  50 mg Oral BID  . metoprolol succinate  50 mg Oral Once  . sodium chloride flush  3 mL Intravenous Q12H   Continuous Infusions: . furosemide (LASIX) infusion 5 mg/hr (02/09/16 1042)     LOS: 4 days    Time spent: 25 minutes. Greater than 50% of  this time was spent in direct contact with the patient coordinating care.     Lelon Frohlich, MD [facility] Pager [phone]  If 7PM-7AM, please contact night-coverage [email or web address] Password [email or web address] 02/09/2016, 6:50 PM

## 2016-02-09 NOTE — Plan of Care (Signed)
Problem: Skin Integrity: Goal: Risk for impaired skin integrity will decrease Outcome: Progressing GENERALIZED EDEMA AND BILATERAL LOWER EXTREMITY EDEMA ARE MUCH IMPROVED. PT STATES HE FELLS "SO MUCH BETTER!"   Problem: Fluid Volume: Goal: Ability to maintain a balanced intake and output will improve Outcome: Progressing PT REMAINS ON LASIX IV DRIP. 1500CC/DAY FLUID RESTRICTION BEING FOLLOWED. GOOD URINARY OUTPUT.

## 2016-02-10 LAB — GLUCOSE, CAPILLARY
Glucose-Capillary: 98 mg/dL (ref 65–99)
Glucose-Capillary: 120 mg/dL — ABNORMAL HIGH (ref 65–99)
Glucose-Capillary: 154 mg/dL — ABNORMAL HIGH (ref 65–99)

## 2016-02-10 LAB — BASIC METABOLIC PANEL
ANION GAP: 9 (ref 5–15)
BUN: 40 mg/dL — ABNORMAL HIGH (ref 6–20)
CALCIUM: 8.4 mg/dL — AB (ref 8.9–10.3)
CO2: 38 mmol/L — ABNORMAL HIGH (ref 22–32)
CREATININE: 1.57 mg/dL — AB (ref 0.61–1.24)
Chloride: 92 mmol/L — ABNORMAL LOW (ref 101–111)
GFR, EST AFRICAN AMERICAN: 51 mL/min — AB (ref >60)
GFR, EST NON AFRICAN AMERICAN: 44 mL/min — AB (ref >60)
GLUCOSE: 96 mg/dL (ref 65–99)
Potassium: 3.5 mmol/L (ref 3.5–5.1)
Sodium: 139 mmol/L (ref 135–145)

## 2016-02-10 MED ORDER — TORSEMIDE 20 MG PO TABS
20.0000 mg | ORAL_TABLET | Freq: Two times a day (BID) | ORAL | Status: DC
  Administered 2016-02-10 – 2016-02-12 (×4): 20 mg via ORAL
  Filled 2016-02-10 (×4): qty 1

## 2016-02-10 NOTE — Progress Notes (Signed)
PROGRESS NOTE    Phillip Wilson  TZG:[id number] DOB: [birth year] DOA: 02/05/2016 PCP: Vic Blackbird, MD     Brief Narrative:  71 year old man admitted to the hospital from the cardiology clinic on 1/9 with complaints of bilateral leg edema and dyspnea. Also found to be in A. fib with RVR and admitted for further management and treatment.   Assessment & Plan:   Principal Problem:   Acute on chronic combined systolic and diastolic CHF (congestive heart failure) (HCC) Active Problems:   Essential hypertension   Atrial fibrillation with RVR (HCC)   Thrombocytopenia (HCC)   Gastroesophageal reflux disease   Hypokalemia   CKD (chronic kidney disease), stage II   Acute on chronic combined CHF -Echo from June 2017 with ejection fraction of 15-20%. -Volume status improved.  -Follow intake and output and strive for negative fluid balance. He is at least 6.3 L negative, although suspect some error in documentation as clinically he appears to have lost significantly more. -Will take off lasix drip and place on PO torsemide.  A. fib with RVR -Rate controlled on oral amiodarone and Toprol-XL. -Anticoagulated on saveysa.  Chronic kidney disease stage II -Cr improving, down to 1.57 from 1.7.  Hypokalemia/Hyperkalemia -Replaced -Magnesium normal at 2.2 -Is now hyperkalemic, will discontinue potassium supplementation and follow potassium closely. -On 1/14 K is 3.5, not currently receiving supplementation.  Thrombocytopenia -Stable at 126   DVT prophylaxis: Saveysa Code Status: Full code Family Communication: patient only Disposition Plan: transfer to floor.  Consultants:   Cardiology  Procedures:   None  Antimicrobials:  Anti-infectives    None       Subjective: Feels less short of breath, still markedly volume overloaded, although improved in the past 24 hours.  Objective: Vitals:   02/10/16 1143 02/10/16 1200 02/10/16 1300 02/10/16 1500  BP: 105/63  115/84  99/68  Pulse: 94  88 83  Resp: 17  16 15   Temp:  97.8 F (36.6 C)    TempSrc:  Oral    SpO2: 100%  100% 97%  Weight:      Height:        Intake/Output Summary (Last 24 hours) at 02/10/16 1559 Last data filed at 02/10/16 1517  Gross per 24 hour  Intake          1396.67 ml  Output             4600 ml  Net         -3203.33 ml   Filed Weights   02/08/16 0623 02/09/16 0400 02/10/16 0500  Weight: 106.3 kg (234 lb 5.6 oz) 101.2 kg (223 lb 1.7 oz) 99.4 kg (219 lb 2.2 oz)    Examination:  General exam: Alert, awake, oriented x 3 Respiratory system: Clear to auscultation. Respiratory effort normal. Cardiovascular system:RRR. No murmurs, rubs, gallops. Gastrointestinal system: Abdomen is nondistended, soft and nontender. No organomegaly or masses felt. Normal bowel sounds heard. Central nervous system: Alert and oriented. No focal neurological deficits. Extremities: 4+ edema up to the hips bilaterally, +pedal pulses Skin: No rashes, lesions or ulcers Psychiatry: Judgement and insight appear normal. Mood & affect appropriate.     Data Reviewed: I have personally reviewed following labs and imaging studies  CBC:  Recent Labs Lab 02/05/16 2030 02/09/16 0516  WBC 9.4 10.2  HGB 13.8 16.1  HCT 42.4 49.2  MCV 89.8 89.8  PLT 104* 759*   Basic Metabolic Panel:  Recent Labs Lab 02/06/16 0503 02/07/16 0449 02/08/16 0445 02/09/16  0516 02/10/16 0442  NA 138 137 137 138 139  K 3.7 4.9 5.4* 4.5 3.5  CL 99* 100* 100* 96* 92*  CO2 28 27 25 31  38*  GLUCOSE 118* 135* 138* 105* 96  BUN 35* 41* 45* 43* 40*  CREATININE 1.38* 1.57* 1.71* 1.72* 1.57*  CALCIUM 8.5* 8.4* 8.5* 8.5* 8.4*  MG 2.2  --   --   --   --    GFR: Estimated Creatinine Clearance: 51.5 mL/min (by C-G formula based on SCr of 1.57 mg/dL (H)). Liver Function Tests: No results for input(s): AST, ALT, ALKPHOS, BILITOT, PROT, ALBUMIN in the last 168 hours. No results for input(s): LIPASE, AMYLASE in the last 168  hours. No results for input(s): AMMONIA in the last 168 hours. Coagulation Profile: No results for input(s): INR, PROTIME in the last 168 hours. Cardiac Enzymes: No results for input(s): CKTOTAL, CKMB, CKMBINDEX, TROPONINI in the last 168 hours. BNP (last 3 results) No results for input(s): PROBNP in the last 8760 hours. HbA1C: No results for input(s): HGBA1C in the last 72 hours. CBG:  Recent Labs Lab 02/09/16 1202 02/09/16 1550 02/09/16 2102 02/10/16 0754 02/10/16 1152  GLUCAP 103* 101* 141* 98 120*   Lipid Profile: No results for input(s): CHOL, HDL, LDLCALC, TRIG, CHOLHDL, LDLDIRECT in the last 72 hours. Thyroid Function Tests: No results for input(s): TSH, T4TOTAL, FREET4, T3FREE, THYROIDAB in the last 72 hours. Anemia Panel: No results for input(s): VITAMINB12, FOLATE, FERRITIN, TIBC, IRON, RETICCTPCT in the last 72 hours. Urine analysis:    Component Value Date/Time   COLORURINE YELLOW 01/21/2011 [place] 01/21/2011 1246   LABSPEC >1.030 (H) 01/21/2011 1246   PHURINE 6.0 01/21/2011 1246   [place] 01/21/2011 1246   HGBUR NEGATIVE 01/21/2011 1246   HGBUR negative 02/14/2008 [place] 01/21/2011 1246   KETONESUR 40 (A) 01/21/2011 1246   PROTEINUR 100 (A) 01/21/2011 1246   UROBILINOGEN 1.0 01/21/2011 1246   NITRITE NEGATIVE 01/21/2011 1246   LEUKOCYTESUR NEGATIVE 01/21/2011 1246   Sepsis Labs: @LABRCNTIP (procalcitonin:4,lacticidven:4)  ) Recent Results (from the past 240 hour(s))  Surgical pcr screen     Status: None   Collection Time: 02/06/16  1:09 PM  Result Value Ref Range Status   MRSA, PCR NEGATIVE NEGATIVE Final   Staphylococcus aureus NEGATIVE NEGATIVE Final    Comment:        The Xpert SA Assay (FDA approved for NASAL specimens in patients over 51 years of age), is one component of a comprehensive surveillance program.  Test performance has been validated by [facility] for patients greater than or  equal to 60 year old. It is not intended to diagnose infection nor to guide or monitor treatment.          Radiology Studies: No results found.      Scheduled Meds: . amiodarone  200 mg Oral Daily  . atorvastatin  40 mg Oral Daily  . digoxin  250 mcg Oral Daily  . edoxaban  60 mg Oral Daily  . metoprolol succinate  50 mg Oral BID  . metoprolol succinate  50 mg Oral Once  . sodium chloride flush  3 mL Intravenous Q12H  . torsemide  20 mg Oral BID   Continuous Infusions:    LOS: 5 days    Time spent: 25 minutes. Greater than 50% of this time was spent in direct contact with the patient coordinating care.     Lelon Frohlich, MD [facility] Pager  [phone]  If 7PM-7AM, please contact night-coverage [email or web address] Password [facility] 02/10/2016, 3:59 PM

## 2016-02-11 LAB — GLUCOSE, CAPILLARY
GLUCOSE-CAPILLARY: 94 mg/dL (ref 65–99)
Glucose-Capillary: 101 mg/dL — ABNORMAL HIGH (ref 65–99)
Glucose-Capillary: 98 mg/dL (ref 65–99)
Glucose-Capillary: 109 mg/dL — ABNORMAL HIGH (ref 65–99)

## 2016-02-11 LAB — BASIC METABOLIC PANEL
Anion gap: 14 (ref 5–15)
BUN: 39 mg/dL — AB (ref 6–20)
CALCIUM: 8.3 mg/dL — AB (ref 8.9–10.3)
CO2: 40 mmol/L — ABNORMAL HIGH (ref 22–32)
CREATININE: 1.54 mg/dL — AB (ref 0.61–1.24)
Chloride: 85 mmol/L — ABNORMAL LOW (ref 101–111)
GFR calc Af Amer: 52 mL/min — ABNORMAL LOW (ref >60)
GFR, EST NON AFRICAN AMERICAN: 45 mL/min — AB (ref >60)
Glucose, Bld: 112 mg/dL — ABNORMAL HIGH (ref 65–99)
Potassium: 3 mmol/L — ABNORMAL LOW (ref 3.5–5.1)
SODIUM: 139 mmol/L (ref 135–145)

## 2016-02-11 MED ORDER — POTASSIUM CHLORIDE CRYS ER 20 MEQ PO TBCR
40.0000 meq | EXTENDED_RELEASE_TABLET | Freq: Two times a day (BID) | ORAL | Status: AC
  Administered 2016-02-11 (×2): 40 meq via ORAL
  Filled 2016-02-11 (×2): qty 2

## 2016-02-11 MED ORDER — METOPROLOL SUCCINATE ER 50 MG PO TB24
75.0000 mg | ORAL_TABLET | Freq: Two times a day (BID) | ORAL | Status: DC
  Administered 2016-02-11 – 2016-02-12 (×3): 75 mg via ORAL
  Filled 2016-02-11 (×3): qty 1

## 2016-02-11 MED ORDER — DIGOXIN 125 MCG PO TABS
0.1250 mg | ORAL_TABLET | Freq: Every day | ORAL | Status: DC
  Administered 2016-02-11 – 2016-02-12 (×2): 0.125 mg via ORAL
  Filled 2016-02-11: qty 1

## 2016-02-11 MED ORDER — AMIODARONE HCL 200 MG PO TABS
400.0000 mg | ORAL_TABLET | Freq: Two times a day (BID) | ORAL | Status: DC
  Administered 2016-02-11 – 2016-02-12 (×3): 400 mg via ORAL
  Filled 2016-02-11 (×3): qty 2

## 2016-02-11 MED ORDER — POTASSIUM CHLORIDE CRYS ER 20 MEQ PO TBCR
40.0000 meq | EXTENDED_RELEASE_TABLET | Freq: Every day | ORAL | Status: DC
  Administered 2016-02-12: 40 meq via ORAL
  Filled 2016-02-11: qty 2

## 2016-02-11 NOTE — Progress Notes (Signed)
Informed by wound care nurse that Unna boot needs to be removed before patient is discharged. Pt has no orders for discharge today. Unna boot still in place.

## 2016-02-11 NOTE — Progress Notes (Signed)
SATURATION QUALIFICATIONS: (This note is used to comply with regulatory documentation for home oxygen)  Patient Saturations on Room Air at Rest = 97 %  Patient Saturations on Room Air while Ambulating = 88 %

## 2016-02-11 NOTE — Consult Note (Signed)
[place] Nurse wound follow up Wound type:Unnas boots due to CHF overload and edema from feet to below knees.  Has been diuresed and pending discharge this afternoon or tomorrow.  Will discontinue compression and switch to topical therapy for resolving blisters to lower legs.  Measurement:Right lower leg:  Distal wound 5 cm x 6 cm x 0.1 cm ruptured serum filled blister.  Proximal wound 4 cm x 2 cm x 0.2 cm  Right leg, just below knee with 0.5 cm x 1 cm x 0.1 cm ruptured serum filled blister.   Wound TKZ:SWFU and moist Drainage (amount, consistency, odor) minimal serosanguinous Periwound:Edema resolving Dressing procedure/placement/frequency:Remove Unna's boots from bilateral lower legs.  Cleanse legs with soap and water.  Non adherent dressings to resolving blisters.  Will follow up with cardiology.  Will not follow at this time.  Please re-consult if needed.  Domenic Moras RN BSN [place] Pager [phone]

## 2016-02-11 NOTE — Progress Notes (Signed)
Patient Name: Phillip Wilson Date of Encounter: 02/11/2016  Primary Cardiologist:   Hospital Problem List     Principal Problem:   Acute on chronic combined systolic and diastolic CHF (congestive heart failure) ([place]) Active Problems:   Essential hypertension   Atrial fibrillation with RVR (HCC)   Thrombocytopenia (HCC)   Gastroesophageal reflux disease   Hypokalemia   CKD (chronic kidney disease), stage II     Subjective   Anxious to go home. Breathing better.   Inpatient Medications    Scheduled Meds: . amiodarone  200 mg Oral Daily  . atorvastatin  40 mg Oral Daily  . digoxin  250 mcg Oral Daily  . edoxaban  60 mg Oral Daily  . metoprolol succinate  50 mg Oral BID  . metoprolol succinate  50 mg Oral Once  . sodium chloride flush  3 mL Intravenous Q12H  . torsemide  20 mg Oral BID   Continuous Infusions:  PRN Meds: sodium chloride, acetaminophen, albuterol, LORazepam, ondansetron (ZOFRAN) IV, sodium chloride flush   Vital Signs    Vitals:   02/10/16 1700 02/10/16 2130 02/11/16 0423 02/11/16 0610  BP: 134/83 122/77  118/73  Pulse: 98 82  84  Resp: 20 20  20   Temp: 98.3 F (36.8 C) 98 F (36.7 C)  98.6 F (37 C)  TempSrc:  Oral  Oral  SpO2: 94% 94%  95%  Weight:   211 lb 14.4 oz (96.1 kg)   Height:        Intake/Output Summary (Last 24 hours) at 02/11/16 0801 Last data filed at 02/11/16 0611  Gross per 24 hour  Intake           631.67 ml  Output             4040 ml  Net         -3408.33 ml   Filed Weights   02/09/16 0400 02/10/16 0500 02/11/16 0423  Weight: 223 lb 1.7 oz (101.2 kg) 219 lb 2.2 oz (99.4 kg) 211 lb 14.4 oz (96.1 kg)    Physical Exam    GEN: Well nourished, well developed, in no acute distress.  HEENT: Grossly normal.  Neck: Supple, positive for  JVD, carotid bruits, or masses. Cardiac: IRRR, distant, 1/6 systolic  murmurs, rubs, or gallops. No clubbing, cyanosis, generalized edema.  Radials/DP/PT 2+ and equal bilaterally.   Respiratory:  Respirations regular and unlabored, on O2, clear to auscultation bilaterally. No wheezes or rhonchi.  GI: Soft, nontender, nondistended, BS + x 4. MS: no deformity or atrophy. Bilateral edema of the feet, ACE wrap to the left leg.   Skin: warm and dry, no rash. Neuro:  Strength and sensation are intact. Psych: AAOx3.  Normal affect.  Labs    CBC  Recent Labs  02/09/16 0516  WBC 10.2  HGB 16.1  HCT 49.2  MCV 89.8  PLT 333*   Basic Metabolic Panel  Recent Labs  02/10/16 0442 02/11/16 0509  NA 139 139  K 3.5 3.0*  CL 92* 85*  CO2 38* 40*  GLUCOSE 96 112*  BUN 40* 39*  CREATININE 1.57* 1.54*  CALCIUM 8.4* 8.3*     Telemetry    Atrial fib with slow ventricular response.  - Personally Reviewed  ECG     - Personally Reviewed  Radiology    No results found.  Cardiac Studies  ------------------------------------------------------------------- Echo 07/06/2015  - Left ventricle: Systolic function is severely reduced, estimated   EF 15-20%. The cavity  size was moderately dilated. Wall thickness   was increased in a pattern of mild LVH. Diffuse hypokinesis with   some preservation of lateral wall contraction. The study is not   technically sufficient to allow evaluation of LV diastolic   function. Doppler parameters are consistent with high ventricular   filling pressure. - Aorta: Mild aortic root dilatation. Aortic root dimension: 43 mm   (ED). - Mitral valve: Mildly thickened leaflets . There was mild to   moderate regurgitation. - Left atrium: The atrium was severely dilated. - Right ventricle: The cavity size was moderately dilated. Systolic   function was mildly to moderately reduced. - Right atrium: The atrium was mildly to moderately dilated. - Tricuspid valve: There was mild regurgitation. - Pulmonic valve: There was mild regurgitation. - Pulmonary arteries: Systolic pressure was moderately increased.   PA peak pressure: 51 mm Hg  (S). - Inferior vena cava: The vessel was dilated. The respirophasic   diameter changes were blunted (< 50%), consistent with elevated   central venous pressure. - Pericardium, extracardiac: A trivial pericardial effusion was   identified.   Patient Profile    71 year old with history of atrial fibrillation started on amiodarone last week and Toprol increased heart rates remain elevated admitted with CHF and 14 pound weight gain from last week. EF 15-20%.   Assessment & Plan    1. Acute on Chronic Mixed CHF:  He has done well on IV lasix gtt and now transitioned to po torsemide. Hypokalemic this am. Will provide replacement, and begin 40 mEq daily. Consider spironolactone but creatinine 1.5.  On metoprolol 50 mg BID. Not on ACE due to renal insufficiency.   2. Atrial fib: Heart rate is controlled on metoprolol 50 mg BID, Digoxin. Creatinine elevated. Will need to check dig level today.   3. NICM: EF severely reduced at 15%. Dietary non-compliance is an issue.   4. Hx of Hypertension: BP low normal for reduced EF. Continue current management,   Signed, Jory Sims, NP  02/11/2016, 8:01 AM     Patient seen and discussed with NP Purcell Nails, I agree with her documentation. Admitted with acute on chronic combined systolic/diastolic HF. Echo 06/2015 LVEF 15-20%, cannot eval diastolic function but evidence of elevated LA pressure, mild to mod RV dysfunction. LVEF had been 25-30% in Jan 2017.  He is negative 3 liters yesterday, negative 7.6 liters since admission. He has been on lasix drip which was stopped yesterday, started on torsemide 20mg  bid last night. Mild uptrend in Cr and BUN. Continue oral torsemide today, follow I/Os and renal function. For his afib he is on amio 200, digoxin 250, toprol XL 50g bid, savaysa. From notes patient started on oral amio 200mg  daily on 01/22/16, this admit completed an IV load of amiodarone and back on 200mg  daily. Rates modestly controlled 80s-low 100s. We  discussed possible cardioversion. Patient reports missing some doses of his edoxaban about 2 weeks ago. I do not see the urgency for a TEE/DCCV. Will discuss with his primary cardiologist, but given his worsening CHF may be worth trying to get him back in SR, scheduling outpatient DCCV in 2-3 weeks. I emphasized to the patient importance of taking anticoag everyday for at least 3 weeks in order to do the procedure. With his borderline GFR lower digoxin to 0.125mg  daily. Change amio to 400mg  bid x 1 week, then 200mg  bid for 2 weeks, then 200mg  daily.Increase Toprol XL to 75mg  bid.  Outpatient f/u in 2 week to reassess rhythm  and reconsider DCCV at that time if compliant with anticoag. Ambulate patient with nursing staff, if does well discharge today in afternoon would be reasonable. AKI on admission, ACE-I held, likely restart low dose at outpatient f/u if renal functions stable and room with bp.     Carlyle Dolly MD     02/11/2016 8:01 AM

## 2016-02-11 NOTE — Progress Notes (Signed)
PROGRESS NOTE    Phillip Wilson  EUM:[id number] DOB: 12-13-1945 DOA: 02/05/2016 PCP: Vic Blackbird, MD     Brief Narrative:  71 year old man admitted to the hospital from the cardiology clinic on 1/9 with complaints of bilateral leg edema and dyspnea. Also found to be in A. fib with RVR and admitted for further management and treatment.   Assessment & Plan:   Principal Problem:   Acute on chronic combined systolic and diastolic CHF (congestive heart failure) (HCC) Active Problems:   Essential hypertension   Atrial fibrillation with RVR (HCC)   Thrombocytopenia (HCC)   Gastroesophageal reflux disease   Hypokalemia   CKD (chronic kidney disease), stage II   Acute on chronic combined CHF -Echo from June 2017 with ejection fraction of 15-20%. -Volume status improved.  -Follow intake and output and strive for negative fluid balance. He is at least 9.3 L negative, about 2 L negative past 24 hours although suspect some error in documentation as clinically he appears to have lost significantly more. -Continue oral torsemide. -Appreciate cardiology input and recommendations.  A. fib with RVR -Rate controlled on oral amiodarone and Toprol-XL. -Cardiology is adjusting amiodarone dose. -Anticoagulated on saveysa. -Cardiology recommends DC CV in 2-3 weeks, patient advised to consistently take his anticoagulation to prepare him for this.  Chronic kidney disease stage II -Cr improving, down to 1.54 from 1.7.  Hypokalemia/Hyperkalemia -On 1/15 is hypokalemic, this will be replaced. -At one point he became hyperkalemic and daily potassium supplementation had been discontinued.  Thrombocytopenia -Stable at 126   DVT prophylaxis: Saveysa Code Status: Full code Family Communication: patient only Disposition Plan: Hope for discharge home in 24-48 hours  Consultants:   Cardiology  Procedures:   None  Antimicrobials:  Anti-infectives    None       Subjective: Feels  less short of breath, Feels much improved, has better mobility in his legs  Objective: Vitals:   02/11/16 0423 02/11/16 0610 02/11/16 1009 02/11/16 1427  BP:  118/73 125/71 114/75  Pulse:  84 73 65  Resp:  20  18  Temp:  98.6 F (37 C)  98.4 F (36.9 C)  TempSrc:  Oral  Oral  SpO2:  95%  98%  Weight: 96.1 kg (211 lb 14.4 oz)     Height:        Intake/Output Summary (Last 24 hours) at 02/11/16 1656 Last data filed at 02/11/16 1555  Gross per 24 hour  Intake              480 ml  Output             3490 ml  Net            -3010 ml   Filed Weights   02/09/16 0400 02/10/16 0500 02/11/16 0423  Weight: 101.2 kg (223 lb 1.7 oz) 99.4 kg (219 lb 2.2 oz) 96.1 kg (211 lb 14.4 oz)    Examination:  General exam: Alert, awake, oriented x 3 Respiratory system: Clear to auscultation. Respiratory effort normal. Cardiovascular system:RRR. No murmurs, rubs, gallops. Gastrointestinal system: Abdomen is nondistended, soft and nontender. No organomegaly or masses felt. Normal bowel sounds heard. Central nervous system: Alert and oriented. No focal neurological deficits. Extremities: 2+ edema , +pedal pulses Skin: No rashes, lesions or ulcers Psychiatry: Judgement and insight appear normal. Mood & affect appropriate.     Data Reviewed: I have personally reviewed following labs and imaging studies  CBC:  Recent Labs Lab 02/05/16 2030  02/09/16 0516  WBC 9.4 10.2  HGB 13.8 16.1  HCT 42.4 49.2  MCV 89.8 89.8  PLT 104* 885*   Basic Metabolic Panel:  Recent Labs Lab 02/06/16 0503 02/07/16 0449 02/08/16 0445 02/09/16 0516 02/10/16 0442 02/11/16 0509  NA 138 137 137 138 139 139  K 3.7 4.9 5.4* 4.5 3.5 3.0*  CL 99* 100* 100* 96* 92* 85*  CO2 28 27 25 31  38* 40*  GLUCOSE 118* 135* 138* 105* 96 112*  BUN 35* 41* 45* 43* 40* 39*  CREATININE 1.38* 1.57* 1.71* 1.72* 1.57* 1.54*  CALCIUM 8.5* 8.4* 8.5* 8.5* 8.4* 8.3*  MG 2.2  --   --   --   --   --    GFR: Estimated Creatinine  Clearance: 51.6 mL/min (by C-G formula based on SCr of 1.54 mg/dL (H)). Liver Function Tests: No results for input(s): AST, ALT, ALKPHOS, BILITOT, PROT, ALBUMIN in the last 168 hours. No results for input(s): LIPASE, AMYLASE in the last 168 hours. No results for input(s): AMMONIA in the last 168 hours. Coagulation Profile: No results for input(s): INR, PROTIME in the last 168 hours. Cardiac Enzymes: No results for input(s): CKTOTAL, CKMB, CKMBINDEX, TROPONINI in the last 168 hours. BNP (last 3 results) No results for input(s): PROBNP in the last 8760 hours. HbA1C: No results for input(s): HGBA1C in the last 72 hours. CBG:  Recent Labs Lab 02/10/16 1152 02/10/16 2129 02/11/16 0722 02/11/16 1116 02/11/16 1614  GLUCAP 120* 154* 94 101* 98   Lipid Profile: No results for input(s): CHOL, HDL, LDLCALC, TRIG, CHOLHDL, LDLDIRECT in the last 72 hours. Thyroid Function Tests: No results for input(s): TSH, T4TOTAL, FREET4, T3FREE, THYROIDAB in the last 72 hours. Anemia Panel: No results for input(s): VITAMINB12, FOLATE, FERRITIN, TIBC, IRON, RETICCTPCT in the last 72 hours. Urine analysis:    Component Value Date/Time   COLORURINE YELLOW 01/21/2011 [place] 01/21/2011 1246   LABSPEC >1.030 (H) 01/21/2011 1246   PHURINE 6.0 01/21/2011 1246   [place] 01/21/2011 1246   HGBUR NEGATIVE 01/21/2011 1246   HGBUR negative 02/14/2008 [place] 01/21/2011 1246   KETONESUR 40 (A) 01/21/2011 1246   PROTEINUR 100 (A) 01/21/2011 1246   UROBILINOGEN 1.0 01/21/2011 1246   NITRITE NEGATIVE 01/21/2011 1246   LEUKOCYTESUR NEGATIVE 01/21/2011 1246   Sepsis Labs: @LABRCNTIP (procalcitonin:4,lacticidven:4)  ) Recent Results (from the past 240 hour(s))  Surgical pcr screen     Status: None   Collection Time: 02/06/16  1:09 PM  Result Value Ref Range Status   MRSA, PCR NEGATIVE NEGATIVE Final   Staphylococcus aureus NEGATIVE NEGATIVE Final    Comment:         The Xpert SA Assay (FDA approved for NASAL specimens in patients over 59 years of age), is one component of a comprehensive surveillance program.  Test performance has been validated by [facility] for patients greater than or equal to 15 year old. It is not intended to diagnose infection nor to guide or monitor treatment.          Radiology Studies: No results found.      Scheduled Meds: . amiodarone  400 mg Oral BID  . atorvastatin  40 mg Oral Daily  . digoxin  0.125 mg Oral Daily  . edoxaban  60 mg Oral Daily  . metoprolol succinate  50 mg Oral Once  . metoprolol succinate  75 mg Oral BID  . potassium chloride  40 mEq Oral BID  Followed by  . [START ON 02/12/2016] potassium chloride  40 mEq Oral Daily  . sodium chloride flush  3 mL Intravenous Q12H  . torsemide  20 mg Oral BID   Continuous Infusions:    LOS: 6 days    Time spent: 25 minutes. Greater than 50% of this time was spent in direct contact with the patient coordinating care.     Lelon Frohlich, MD [facility] Pager [phone]  If 7PM-7AM, please contact night-coverage [email or web address] Password [facility] 02/11/2016, 4:56 PM

## 2016-02-12 LAB — CBC
HCT: 44.8 % (ref 39.0–52.0)
Hemoglobin: 14.5 g/dL (ref 13.0–17.0)
MCH: 29.7 pg (ref 26.0–34.0)
MCHC: 32.4 g/dL (ref 30.0–36.0)
MCV: 91.8 fL (ref 78.0–100.0)
PLATELETS: 125 10*3/uL — AB (ref 150–400)
RBC: 4.88 MIL/uL (ref 4.22–5.81)
RDW: 20.8 % — AB (ref 11.5–15.5)
WBC: 8.9 10*3/uL (ref 4.0–10.5)

## 2016-02-12 LAB — BASIC METABOLIC PANEL
Anion gap: 9 (ref 5–15)
BUN: 33 mg/dL — AB (ref 6–20)
CALCIUM: 8.4 mg/dL — AB (ref 8.9–10.3)
CO2: 41 mmol/L — AB (ref 22–32)
CREATININE: 1.28 mg/dL — AB (ref 0.61–1.24)
Chloride: 89 mmol/L — ABNORMAL LOW (ref 101–111)
GFR calc Af Amer: >60 mL/min (ref >60)
GFR calc non Af Amer: 56 mL/min — ABNORMAL LOW (ref >60)
GLUCOSE: 100 mg/dL — AB (ref 65–99)
Potassium: 3.4 mmol/L — ABNORMAL LOW (ref 3.5–5.1)
Sodium: 139 mmol/L (ref 135–145)

## 2016-02-12 LAB — GLUCOSE, CAPILLARY
GLUCOSE-CAPILLARY: 93 mg/dL (ref 65–99)
Glucose-Capillary: 210 mg/dL — ABNORMAL HIGH (ref 65–99)

## 2016-02-12 MED ORDER — DIGOXIN 125 MCG PO TABS: 0.1250 mg | ORAL_TABLET | Freq: Every day | ORAL | 1 refills | Status: DC

## 2016-02-12 MED ORDER — TORSEMIDE 20 MG PO TABS: 20.0000 mg | ORAL_TABLET | Freq: Every day | ORAL | 2 refills | Status: DC

## 2016-02-12 MED ORDER — METOPROLOL SUCCINATE ER 25 MG PO TB24: 75.0000 mg | ORAL_TABLET | Freq: Two times a day (BID) | ORAL | 2 refills | Status: DC

## 2016-02-12 MED ORDER — AMIODARONE HCL 400 MG PO TABS: 400.0000 mg | ORAL_TABLET | Freq: Two times a day (BID) | ORAL | 0 refills | Status: DC

## 2016-02-12 MED ORDER — TORSEMIDE 20 MG PO TABS: 20.0000 mg | ORAL_TABLET | Freq: Every day | ORAL | Status: DC

## 2016-02-12 MED ORDER — POTASSIUM CHLORIDE CRYS ER 20 MEQ PO TBCR: 40.0000 meq | EXTENDED_RELEASE_TABLET | Freq: Every day | ORAL | 1 refills | Status: DC

## 2016-02-12 NOTE — Progress Notes (Signed)
Sitting RA 91% While walking in Hallway RA 99%

## 2016-02-12 NOTE — Care Management Note (Signed)
Case Management Note  Patient Details  Name: Phillip Wilson MRN: [id number] Date of Birth: 07-06-1945  Expected Discharge Date:  02/12/16               Expected Discharge Plan:  Home/Self Care  In-House Referral:  NA  Discharge planning Services  CM Consult  Post Acute Care Choice:  NA Choice offered to:  NA  DME Arranged:    DME Agency:     HH Arranged:    [place] Agency:     Status of Service:  Completed, signed off  If discussed at [employer] of Stay Meetings, dates discussed:    Additional Comments: Patient discharging home. No CM needs.   Milli Woolridge, Chauncey [place], RN 02/12/2016, 12:13 PM
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
## 2016-02-12 NOTE — Evaluation (Signed)
Physical Therapy Evaluation Patient Details Name: Phillip Wilson MRN: [id number] DOB: 1945-11-22 Today's Date: 02/12/2016   History of Present Illness   71 y.o. male with medical history significant for severe combined chronic CHF, atrial fibrillation, and hyperlipidemia who presents to the emergency department at the direction of his cardiologist for dyspnea, marked increase in peripheral edema, and recent 15 pound weight gain. Patient has noted progressive edema in the bilateral lower extremities and increasing exertional dyspnea over the past couple weeks and was seen in the cardiology clinic for this on 01/22/2016.  Dx: A-fib with RVR, and CHF with EF of 15%  Clinical Impression  Pt received sitting up on the EOB, and was agreeable to PT evaluation.  Pt expressed that he normally does not use any assistive device for ambulation.  He is normally an Industrial/product designer, and works part time Engineer, structural.  Pt does express that he has not worked in about 2 months, and he was receiving HHPT last month.  During PT evaluation, he ambulated 232ft with RW and Mod (I).  He was able to maintain SpO2 >90%, and his HR ranged from 99bpm - 145bpm.  Pt does not demonstrate need for further skilled PT at this time.    Follow Up Recommendations No PT follow up    Equipment Recommendations  None recommended by PT    Recommendations for Other Services       Precautions / Restrictions Precautions Precautions: None Restrictions Weight Bearing Restrictions: No      Mobility  Bed Mobility Overal bed mobility:  (Not observed, pt sitting up on the EOB upon arrival. )                Transfers Overall transfer level: Needs assistance Equipment used: Rolling walker (2 wheeled) Transfers: Sit to/from Stand Sit to Stand: Modified independent (Device/Increase time)            Ambulation/Gait Ambulation/Gait assistance: Modified independent (Device/Increase time) Ambulation  Distance (Feet): 200 Feet Assistive device: Rolling walker (2 wheeled) Gait Pattern/deviations: Step-through pattern     General Gait Details: pt demonstrates good cadence with SpO2 maintained >90% on RA, however HR ranged from 99bpm - 145bpm during ambulation per telemetry monitoring.   Stairs            Wheelchair Mobility    Modified [place] (Stroke Patients Only)       Balance Overall balance assessment: Modified Independent                                           Pertinent Vitals/Pain Pain Assessment: No/denies pain    Home Living   Living Arrangements: Alone Available Help at Discharge: Family (Son and dtr live in mobile homes on either side of him.  ) Type of Home: Mobile home Home Access: Stairs to enter   Entrance Stairs-Number of Steps: 2 Home Layout: One level Home Equipment: Environmental consultant - 2 wheels;Cane - single point;Bedside commode      Prior Function Level of Independence: Independent   Gait / Transfers Assistance Needed: independent with unlimited community ambulation.   ADL's / Homemaking Assistance Needed: independent with dressing and bathing.  Driving, working part time cleaning floors.          Hand Dominance   Dominant Hand: Right    Extremity/Trunk Assessment  Communication   Communication: No difficulties (thick southern accent with missing dentation and difficult to understand at times. )  Cognition Arousal/Alertness: Awake/alert Behavior During Therapy: WFL for tasks assessed/performed Overall Cognitive Status: Within Functional Limits for tasks assessed                      General Comments General comments (skin integrity, edema, etc.): Pt with bandages on L LE, and noted edema in B LE's.      Exercises     Assessment/Plan    PT Assessment Patent does not need any further PT services  PT Problem List Decreased activity tolerance;Decreased balance          PT Treatment  Interventions Gait training;Therapeutic activities;Functional mobility training    PT Goals (Current goals can be found in the Care Plan section)  Acute Rehab PT Goals PT Goal Formulation: All assessment and education complete, DC therapy    Frequency     Barriers to discharge Decreased caregiver support lives alone, but son and dtr both live in mobile homes on either side of him.    Co-evaluation               End of Session Equipment Utilized During Treatment: Gait belt Activity Tolerance: Patient tolerated treatment well Patient left: with call bell/phone within reach (Sitting on the EOB. )      Functional Assessment Tool Used: [employer] "6-clicks"  Functional Limitation: Mobility: Walking and moving around Mobility: Walking and Moving Around Current Status [phone]): At least 20 percent but less than 40 percent impaired, limited or restricted Mobility: Walking and Moving Around Goal Status [phone]): At least 20 percent but less than 40 percent impaired, limited or restricted Mobility: Walking and Moving Around Discharge Status [phone]): At least 20 percent but less than 40 percent impaired, limited or restricted    Time: 1102-1125 PT Time Calculation (min) (ACUTE ONLY): 23 min   Charges:   PT Evaluation $PT Eval Low Complexity: 1 Procedure PT Treatments $Gait Training: 8-22 mins   PT G Codes:   PT G-Codes **NOT FOR INPATIENT CLASS** Functional Assessment Tool Used: [employer] "6-clicks"  Functional Limitation: Mobility: Walking and moving around Mobility: Walking and Moving Around Current Status [phone]): At least 20 percent but less than 40 percent impaired, limited or restricted Mobility: Walking and Moving Around Goal Status [phone]): At least 20 percent but less than 40 percent impaired, limited or restricted Mobility: Walking and Moving Around Discharge Status [phone]): At least 20 percent but less than 40 percent impaired, limited or  restricted    Beth Heinrich Fertig, PT, DPT X: [phone]

## 2016-02-12 NOTE — Progress Notes (Signed)
Subjective:    No SOB this AM  Objective:   Temp:  [97.8 F (36.6 C)-98.4 F (36.9 C)] 97.9 F (36.6 C) (01/16 0545) Pulse Rate:  [60-73] 61 (01/16 0545) Resp:  [18-20] 20 (01/16 0545) BP: (107-125)/(71-77) 114/77 (01/16 0545) SpO2:  [98 %-99 %] 99 % (01/16 0545) Weight:  [201 lb 14.4 oz (91.6 kg)] 201 lb 14.4 oz (91.6 kg) (01/16 0545) Last BM Date: 02/04/16  Filed Weights   02/10/16 0500 02/11/16 0423 02/12/16 0545  Weight: 219 lb 2.2 oz (99.4 kg) 211 lb 14.4 oz (96.1 kg) 201 lb 14.4 oz (91.6 kg)    Intake/Output Summary (Last 24 hours) at 02/12/16 0858 Last data filed at 02/12/16 0546  Gross per 24 hour  Intake              420 ml  Output             4425 ml  Net            -4005 ml    Telemetry: afib 60s-80s  Exam:  General: NAD  HEENT: sclera clear, throat clear  Resp: CTAB  Cardiac: irreg, no m/r/g, no jvd  GI: abdomen soft, NT, ND  MSK: 1+ bilateral LE edema  Neuro: no focal deficits  Psych: appropriate affect  Lab Results:  Basic Metabolic Panel:  Recent Labs Lab 02/06/16 0503  02/10/16 0442 02/11/16 0509 02/12/16 0451  NA 138  < > 139 139 139  K 3.7  < > 3.5 3.0* 3.4*  CL 99*  < > 92* 85* 89*  CO2 28  < > 38* 40* 41*  GLUCOSE 118*  < > 96 112* 100*  BUN 35*  < > 40* 39* 33*  CREATININE 1.38*  < > 1.57* 1.54* 1.28*  CALCIUM 8.5*  < > 8.4* 8.3* 8.4*  MG 2.2  --   --   --   --   < > = values in this interval not displayed.  Liver Function Tests: No results for input(s): AST, ALT, ALKPHOS, BILITOT, PROT, ALBUMIN in the last 168 hours.  CBC:  Recent Labs Lab 02/05/16 2030 02/09/16 0516 02/12/16 0451  WBC 9.4 10.2 8.9  HGB 13.8 16.1 14.5  HCT 42.4 49.2 44.8  MCV 89.8 89.8 91.8  PLT 104* 126* 125*    Cardiac Enzymes: No results for input(s): CKTOTAL, CKMB, CKMBINDEX, TROPONINI in the last 168 hours.  BNP: No results for input(s): PROBNP in the last 8760 hours.  Coagulation: No results for input(s): INR in the last  168 hours.  ECG:   Medications:   Scheduled Medications: . amiodarone  400 mg Oral BID  . atorvastatin  40 mg Oral Daily  . digoxin  0.125 mg Oral Daily  . edoxaban  60 mg Oral Daily  . metoprolol succinate  50 mg Oral Once  . metoprolol succinate  75 mg Oral BID  . potassium chloride  40 mEq Oral Daily  . sodium chloride flush  3 mL Intravenous Q12H  . torsemide  20 mg Oral BID     Infusions:   PRN Medications:  sodium chloride, acetaminophen, albuterol, LORazepam, ondansetron (ZOFRAN) IV, sodium chloride flush     Assessment/Plan    1. Afib with RVR - From notes patient started on oral amio 200mg  daily on 01/22/16, this admit completed an IV load of amiodarone, now on amio 400mg  bid. Toprol increased to 75mg  bid, due to renal function digoxin lowered to 0.125mg  daily.  -  We discussed possible cardioversion. Patient reports missing some doses of his edoxaban about 2 weeks ago. I do not see the urgency for a TEE/DCCV.  - consider outpatient DCCV at follow up. At that time will have completed oral amio load and been on anticoag consistently - rates 60s-80s this AM    2. Acute on chronic combined systolic/diastolic HF  - Echo 07/6806 LVEF 15-20%, cannot eval diastolic function but evidence of elevated LA pressure, mild to mod RV dysfunction. LVEF had been 25-30% in Jan 2017. - he is negative 4.5 liters yesterday, negative 12 liters since admission. He is on torsemide 20mg  bid.  Downtrend in Cr and BUN consistent with venous congestion and CHF. Very negative on current torsemide dose, will change to 20mg  daily.  - ACE-I held, likely restart low dose at outpatient f/u if renal functions stable and room with bp.   - 88% sat with ambulation yesterday, he has diuresed an additional 4 liters since then. Repeat ambulation O2 today, may need home O2.    Patient is reasonable for discharge today. We will arrange outpatient appt for next week along wilth BMET/Mg.     Carlyle Dolly, M.D., F.A.C.C.Patient ID: Phillip Wilson, male   DOB: November 16, 1945, 71 y.o.   MRN: [id number]

## 2016-02-12 NOTE — Progress Notes (Signed)
Patient being d/c home with daughter. IV cath removed and intact. No c/o pain at the site or at this time. Verbalizes understanding of instructions and changes with medications.

## 2016-02-20 ENCOUNTER — Other Ambulatory Visit (HOSPITAL_COMMUNITY)
Admission: RE | Admit: 2016-02-20 | Discharge: 2016-02-20 | Disposition: A | Discharge status: Home / Self Care | Payer: BLUE CROSS/BLUE SHIELD | Source: Ambulatory Visit | Attending: Physician Assistant | Admitting: Physician Assistant

## 2016-02-20 ENCOUNTER — Encounter: Payer: Self-pay | Admitting: *Deleted

## 2016-02-20 ENCOUNTER — Telehealth: Payer: Self-pay | Admitting: *Deleted

## 2016-02-20 ENCOUNTER — Ambulatory Visit (INDEPENDENT_AMBULATORY_CARE_PROVIDER_SITE_OTHER): Payer: BLUE CROSS/BLUE SHIELD | Admitting: Physician Assistant

## 2016-02-20 ENCOUNTER — Encounter: Payer: Self-pay | Admitting: Physician Assistant

## 2016-02-20 VITALS — BP 110/62 | HR 72 | Wt 216.0 lb

## 2016-02-20 DIAGNOSIS — I10 Essential (primary) hypertension: Secondary | ICD-10-CM | POA: Diagnosis not present

## 2016-02-20 DIAGNOSIS — I481 Persistent atrial fibrillation: Secondary | ICD-10-CM | POA: Diagnosis not present

## 2016-02-20 DIAGNOSIS — I5043 Acute on chronic combined systolic (congestive) and diastolic (congestive) heart failure: Secondary | ICD-10-CM

## 2016-02-20 DIAGNOSIS — I4819 Other persistent atrial fibrillation: Secondary | ICD-10-CM

## 2016-02-20 DIAGNOSIS — N182 Chronic kidney disease, stage 2 (mild): Secondary | ICD-10-CM

## 2016-02-20 LAB — BASIC METABOLIC PANEL
ANION GAP: 8 (ref 5–15)
BUN: 23 mg/dL — AB (ref 6–20)
CHLORIDE: 100 mmol/L — AB (ref 101–111)
CO2: 31 mmol/L (ref 22–32)
Calcium: 9.1 mg/dL (ref 8.9–10.3)
Creatinine, Ser: 1.25 mg/dL — ABNORMAL HIGH (ref 0.61–1.24)
GFR calc Af Amer: >60 mL/min (ref >60)
GFR, EST NON AFRICAN AMERICAN: 57 mL/min — AB (ref >60)
GLUCOSE: 98 mg/dL (ref 65–99)
POTASSIUM: 4.4 mmol/L (ref 3.5–5.1)
Sodium: 139 mmol/L (ref 135–145)

## 2016-02-20 LAB — BRAIN NATRIURETIC PEPTIDE

## 2016-02-20 MED ORDER — TORSEMIDE 20 MG PO TABS: 60.0000 mg | ORAL_TABLET | Freq: Every day | ORAL | 6 refills | Status: DC

## 2016-02-20 MED ORDER — POTASSIUM CHLORIDE CRYS ER 20 MEQ PO TBCR: 80.0000 meq | EXTENDED_RELEASE_TABLET | Freq: Every day | ORAL | 6 refills | Status: DC

## 2016-02-20 MED ORDER — AMIODARONE HCL 200 MG PO TABS: 200.0000 mg | ORAL_TABLET | Freq: Every day | ORAL | 3 refills | Status: DC

## 2016-02-20 NOTE — Patient Instructions (Addendum)
Your physician recommends that you schedule a follow-up appointment with Dr. Bronson Ing.  Your physician has recommended you make the following change in your medication:  Decrease Amiodarone to 200 mg Daily  Increase Demadex to 60 mg Daily ( 3 Tablets total)  Increase Potassium 80 mEq Daily (4 Tablets total)  Your physician recommends that you have lab work done today.  You have been referred to [facility]   Call out office on Friday If no weight loss.   If you need a refill on your cardiac medications before your next appointment, please call your pharmacy.  Thank you for choosing [facility]!

## 2016-02-20 NOTE — Telephone Encounter (Signed)
Call placed to patient to schedule F/U.   Call placed to patient. No answer. No VM.
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
## 2016-02-21 ENCOUNTER — Encounter: Payer: Self-pay | Admitting: *Deleted

## 2016-02-21 NOTE — Telephone Encounter (Signed)
Call placed to patient. No answer. No VM.  

## 2016-02-21 NOTE — Telephone Encounter (Signed)
Received call from Rondel Jumbo, Occupational Nurse with [employer]. [phone]~ telephone/ [phone]~ fax.   Reports that she requires an excuse from work note.   Faxed letter with tentative return date of 03/04/2016.

## 2016-02-22 ENCOUNTER — Telehealth: Payer: Self-pay | Admitting: *Deleted

## 2016-02-22 NOTE — Telephone Encounter (Signed)
Called patient with test results. No answer. Unable to leave message to call back.  

## 2016-02-22 NOTE — Telephone Encounter (Signed)
-----   Message from Imogene Burn, PA-C sent at 02/21/2016  6:41 PM EST ----- Heart failure marker very high. Kidneys the same. Please ask him if he's lost any weight since I saw him or if his swelling is down. He may need to be readmitted to the hospital if not improving

## 2016-02-27 ENCOUNTER — Encounter: Payer: Self-pay | Admitting: Cardiovascular Disease

## 2016-02-27 ENCOUNTER — Encounter (HOSPITAL_COMMUNITY): Payer: Self-pay | Admitting: Emergency Medicine

## 2016-02-27 ENCOUNTER — Emergency Department (HOSPITAL_COMMUNITY): Payer: BLUE CROSS/BLUE SHIELD

## 2016-02-27 ENCOUNTER — Inpatient Hospital Stay (HOSPITAL_COMMUNITY)
Admission: EM | Admit: 2016-02-27 | Discharge: 2016-03-03 | DRG: 291 | Disposition: A | Discharge status: Home / Self Care | Payer: BLUE CROSS/BLUE SHIELD | Attending: Internal Medicine | Admitting: Internal Medicine

## 2016-02-27 ENCOUNTER — Ambulatory Visit (INDEPENDENT_AMBULATORY_CARE_PROVIDER_SITE_OTHER): Payer: BLUE CROSS/BLUE SHIELD | Admitting: Cardiovascular Disease

## 2016-02-27 VITALS — BP 136/93 | HR 62 | Ht 68.0 in | Wt 228.0 lb

## 2016-02-27 DIAGNOSIS — I110 Hypertensive heart disease with heart failure: Secondary | ICD-10-CM | POA: Diagnosis not present

## 2016-02-27 DIAGNOSIS — D696 Thrombocytopenia, unspecified: Secondary | ICD-10-CM | POA: Diagnosis present

## 2016-02-27 DIAGNOSIS — I480 Paroxysmal atrial fibrillation: Secondary | ICD-10-CM | POA: Diagnosis present

## 2016-02-27 DIAGNOSIS — N182 Chronic kidney disease, stage 2 (mild): Secondary | ICD-10-CM | POA: Diagnosis not present

## 2016-02-27 DIAGNOSIS — R0602 Shortness of breath: Secondary | ICD-10-CM | POA: Diagnosis not present

## 2016-02-27 DIAGNOSIS — E785 Hyperlipidemia, unspecified: Secondary | ICD-10-CM | POA: Diagnosis not present

## 2016-02-27 DIAGNOSIS — I429 Cardiomyopathy, unspecified: Secondary | ICD-10-CM

## 2016-02-27 DIAGNOSIS — E782 Mixed hyperlipidemia: Secondary | ICD-10-CM | POA: Diagnosis present

## 2016-02-27 DIAGNOSIS — Z833 Family history of diabetes mellitus: Secondary | ICD-10-CM | POA: Diagnosis not present

## 2016-02-27 DIAGNOSIS — E1121 Type 2 diabetes mellitus with diabetic nephropathy: Secondary | ICD-10-CM

## 2016-02-27 DIAGNOSIS — E875 Hyperkalemia: Secondary | ICD-10-CM | POA: Diagnosis present

## 2016-02-27 DIAGNOSIS — R7302 Impaired glucose tolerance (oral): Secondary | ICD-10-CM | POA: Diagnosis present

## 2016-02-27 DIAGNOSIS — I4519 Other right bundle-branch block: Secondary | ICD-10-CM | POA: Diagnosis present

## 2016-02-27 DIAGNOSIS — I481 Persistent atrial fibrillation: Secondary | ICD-10-CM | POA: Diagnosis not present

## 2016-02-27 DIAGNOSIS — I130 Hypertensive heart and chronic kidney disease with heart failure and stage 1 through stage 4 chronic kidney disease, or unspecified chronic kidney disease: Secondary | ICD-10-CM | POA: Diagnosis not present

## 2016-02-27 DIAGNOSIS — R001 Bradycardia, unspecified: Secondary | ICD-10-CM | POA: Diagnosis not present

## 2016-02-27 DIAGNOSIS — Z7901 Long term (current) use of anticoagulants: Secondary | ICD-10-CM

## 2016-02-27 DIAGNOSIS — Z79899 Other long term (current) drug therapy: Secondary | ICD-10-CM

## 2016-02-27 DIAGNOSIS — I509 Heart failure, unspecified: Secondary | ICD-10-CM

## 2016-02-27 DIAGNOSIS — Z9111 Patient's noncompliance with dietary regimen: Secondary | ICD-10-CM | POA: Diagnosis not present

## 2016-02-27 DIAGNOSIS — N183 Chronic kidney disease, stage 3 (moderate): Secondary | ICD-10-CM | POA: Diagnosis present

## 2016-02-27 DIAGNOSIS — Z825 Family history of asthma and other chronic lower respiratory diseases: Secondary | ICD-10-CM

## 2016-02-27 DIAGNOSIS — Z9289 Personal history of other medical treatment: Secondary | ICD-10-CM

## 2016-02-27 DIAGNOSIS — I4510 Unspecified right bundle-branch block: Secondary | ICD-10-CM | POA: Diagnosis present

## 2016-02-27 DIAGNOSIS — E876 Hypokalemia: Secondary | ICD-10-CM | POA: Diagnosis present

## 2016-02-27 DIAGNOSIS — Z8249 Family history of ischemic heart disease and other diseases of the circulatory system: Secondary | ICD-10-CM | POA: Diagnosis not present

## 2016-02-27 DIAGNOSIS — I4891 Unspecified atrial fibrillation: Secondary | ICD-10-CM | POA: Diagnosis not present

## 2016-02-27 DIAGNOSIS — I10 Essential (primary) hypertension: Secondary | ICD-10-CM | POA: Diagnosis present

## 2016-02-27 DIAGNOSIS — R600 Localized edema: Secondary | ICD-10-CM | POA: Diagnosis not present

## 2016-02-27 DIAGNOSIS — K761 Chronic passive congestion of liver: Secondary | ICD-10-CM | POA: Diagnosis present

## 2016-02-27 DIAGNOSIS — I5043 Acute on chronic combined systolic (congestive) and diastolic (congestive) heart failure: Secondary | ICD-10-CM | POA: Diagnosis present

## 2016-02-27 DIAGNOSIS — Z888 Allergy status to other drugs, medicaments and biological substances status: Secondary | ICD-10-CM

## 2016-02-27 DIAGNOSIS — I4819 Other persistent atrial fibrillation: Secondary | ICD-10-CM | POA: Diagnosis present

## 2016-02-27 DIAGNOSIS — E7800 Pure hypercholesterolemia, unspecified: Secondary | ICD-10-CM

## 2016-02-27 DIAGNOSIS — R609 Edema, unspecified: Secondary | ICD-10-CM | POA: Diagnosis not present

## 2016-02-27 LAB — COMPREHENSIVE METABOLIC PANEL
ALT: 22 U/L (ref 17–63)
AST: 38 U/L (ref 15–41)
Albumin: 3.3 g/dL — ABNORMAL LOW (ref 3.5–5.0)
Alkaline Phosphatase: 212 U/L — ABNORMAL HIGH (ref 38–126)
Anion gap: 13 (ref 5–15)
BUN: 27 mg/dL — ABNORMAL HIGH (ref 6–20)
CHLORIDE: 99 mmol/L — AB (ref 101–111)
CO2: 28 mmol/L (ref 22–32)
CREATININE: 1.68 mg/dL — AB (ref 0.61–1.24)
Calcium: 9.2 mg/dL (ref 8.9–10.3)
GFR calc non Af Amer: 40 mL/min — ABNORMAL LOW (ref >60)
GFR, EST AFRICAN AMERICAN: 47 mL/min — AB (ref >60)
Glucose, Bld: 102 mg/dL — ABNORMAL HIGH (ref 65–99)
POTASSIUM: 5.8 mmol/L — AB (ref 3.5–5.1)
SODIUM: 140 mmol/L (ref 135–145)
Total Bilirubin: 3.3 mg/dL — ABNORMAL HIGH (ref 0.3–1.2)
Total Protein: 6.7 g/dL (ref 6.5–8.1)

## 2016-02-27 LAB — CBC WITH DIFFERENTIAL/PLATELET
BASOS ABS: 0 10*3/uL (ref 0.0–0.1)
BASOS PCT: 0 %
EOS ABS: 0.1 10*3/uL (ref 0.0–0.7)
Eosinophils Relative: 1 %
HCT: 45.8 % (ref 39.0–52.0)
Hemoglobin: 14.6 g/dL (ref 13.0–17.0)
Lymphocytes Relative: 8 %
Lymphs Abs: 0.7 10*3/uL (ref 0.7–4.0)
MCH: 29.5 pg (ref 26.0–34.0)
MCHC: 31.9 g/dL (ref 30.0–36.0)
MCV: 92.5 fL (ref 78.0–100.0)
Monocytes Absolute: 0.9 10*3/uL (ref 0.1–1.0)
Monocytes Relative: 10 %
NEUTROS ABS: 7.6 10*3/uL (ref 1.7–7.7)
Neutrophils Relative %: 81 %
Platelets: 152 10*3/uL (ref 150–400)
RBC: 4.95 MIL/uL (ref 4.22–5.81)
RDW: 21.5 % — AB (ref 11.5–15.5)
SMEAR REVIEW: ADEQUATE
WBC: 9.4 10*3/uL (ref 4.0–10.5)

## 2016-02-27 LAB — DIGOXIN LEVEL: Digoxin Level: 2.4 ng/mL — ABNORMAL HIGH (ref 0.8–2.0)

## 2016-02-27 LAB — BRAIN NATRIURETIC PEPTIDE: B Natriuretic Peptide: >4500 pg/mL — ABNORMAL HIGH (ref 0.0–100.0)

## 2016-02-27 LAB — TROPONIN I: TROPONIN I: 0.04 ng/mL — AB (ref <0.03)

## 2016-02-27 MED ORDER — METOPROLOL SUCCINATE ER 50 MG PO TB24: 75.0000 mg | ORAL_TABLET | Freq: Two times a day (BID) | ORAL | Status: DC

## 2016-02-27 MED ORDER — EDOXABAN TOSYLATE 60 MG PO TABS
60.0000 mg | ORAL_TABLET | Freq: Every day | ORAL | Status: DC
  Administered 2016-02-28 – 2016-03-03 (×5): 60 mg via ORAL
  Filled 2016-02-27 (×7): qty 60

## 2016-02-27 MED ORDER — ATORVASTATIN CALCIUM 40 MG PO TABS
40.0000 mg | ORAL_TABLET | Freq: Every day | ORAL | Status: DC
  Administered 2016-02-28 – 2016-03-03 (×5): 40 mg via ORAL
  Filled 2016-02-27 (×6): qty 1

## 2016-02-27 MED ORDER — BUMETANIDE 0.25 MG/ML IJ SOLN
1.0000 mg | Freq: Two times a day (BID) | INTRAMUSCULAR | Status: DC
  Administered 2016-02-28: 1 mg via INTRAVENOUS
  Filled 2016-02-27 (×2): qty 4

## 2016-02-27 MED ORDER — FUROSEMIDE 10 MG/ML IJ SOLN
40.0000 mg | Freq: Once | INTRAMUSCULAR | Status: AC
  Administered 2016-02-27: 40 mg via INTRAVENOUS
  Filled 2016-02-27: qty 4

## 2016-02-27 MED ORDER — AMIODARONE HCL 200 MG PO TABS
200.0000 mg | ORAL_TABLET | Freq: Every day | ORAL | Status: DC
  Administered 2016-02-28 – 2016-03-03 (×5): 200 mg via ORAL
  Filled 2016-02-27 (×6): qty 1
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
## 2016-02-27 NOTE — ED Triage Notes (Signed)
Seen at [facility] today and told to come to ED for possible admission to remove fluid from left knee.  Denies any pain.  C/o SOB for last month.  Was hospital here about 2 weeks ago.
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
## 2016-02-27 NOTE — Patient Instructions (Signed)
Medication Instructions:  Your physician recommends that you continue on your current medications as directed. Please refer to the Current Medication list given to you today.   Labwork: none  Testing/Procedures: none  Follow-Up: Your physician recommends that you schedule a follow-up appointment in: [place].    Any Other Special Instructions Will Be Listed Below (If Applicable).     If you need a refill on your cardiac medications before your next appointment, please call your pharmacy.
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
## 2016-02-27 NOTE — ED Notes (Signed)
ED Provider at bedside. 

## 2016-02-27 NOTE — ED Notes (Signed)
CRITICAL VALUE ALERT  Critical value received:  Troponin 0.04   Date of notification:  02/27/16  Time of notification:  2118 hrs  Critical value read back:Yes.    Nurse who received alert:  Y. Aleda Madl, RN  Responding MD:  Dr Thurnell Garbe  Time MD responded:  2119 hrs

## 2016-02-28 ENCOUNTER — Encounter (HOSPITAL_COMMUNITY): Payer: Self-pay | Admitting: *Deleted

## 2016-02-28 DIAGNOSIS — I10 Essential (primary) hypertension: Secondary | ICD-10-CM

## 2016-02-28 DIAGNOSIS — E119 Type 2 diabetes mellitus without complications: Secondary | ICD-10-CM

## 2016-02-28 DIAGNOSIS — E875 Hyperkalemia: Secondary | ICD-10-CM

## 2016-02-28 DIAGNOSIS — N183 Chronic kidney disease, stage 3 (moderate): Secondary | ICD-10-CM

## 2016-02-28 DIAGNOSIS — R600 Localized edema: Secondary | ICD-10-CM

## 2016-02-28 DIAGNOSIS — I429 Cardiomyopathy, unspecified: Secondary | ICD-10-CM

## 2016-02-28 DIAGNOSIS — E7800 Pure hypercholesterolemia, unspecified: Secondary | ICD-10-CM

## 2016-02-28 DIAGNOSIS — I481 Persistent atrial fibrillation: Secondary | ICD-10-CM

## 2016-02-28 DIAGNOSIS — I5043 Acute on chronic combined systolic (congestive) and diastolic (congestive) heart failure: Secondary | ICD-10-CM

## 2016-02-28 LAB — GLUCOSE, CAPILLARY
GLUCOSE-CAPILLARY: 228 mg/dL — AB (ref 65–99)
Glucose-Capillary: 140 mg/dL — ABNORMAL HIGH (ref 65–99)
Glucose-Capillary: 100 mg/dL — ABNORMAL HIGH (ref 65–99)

## 2016-02-28 LAB — BASIC METABOLIC PANEL
Anion gap: 7 (ref 5–15)
BUN: 27 mg/dL — AB (ref 6–20)
CHLORIDE: 99 mmol/L — AB (ref 101–111)
CO2: 32 mmol/L (ref 22–32)
CREATININE: 1.59 mg/dL — AB (ref 0.61–1.24)
Calcium: 8.6 mg/dL — ABNORMAL LOW (ref 8.9–10.3)
GFR calc Af Amer: 50 mL/min — ABNORMAL LOW (ref >60)
GFR calc non Af Amer: 43 mL/min — ABNORMAL LOW (ref >60)
GLUCOSE: 81 mg/dL (ref 65–99)
POTASSIUM: 5.3 mmol/L — AB (ref 3.5–5.1)
SODIUM: 138 mmol/L (ref 135–145)

## 2016-02-28 LAB — CBG MONITORING, ED
Glucose-Capillary: 67 mg/dL (ref 65–99)
Glucose-Capillary: 70 mg/dL (ref 65–99)

## 2016-02-28 MED ORDER — INSULIN ASPART 100 UNIT/ML ~~LOC~~ SOLN
0.0000 [IU] | Freq: Every day | SUBCUTANEOUS | Status: DC
  Administered 2016-02-28: 2 [IU] via SUBCUTANEOUS

## 2016-02-28 MED ORDER — ISOSORB DINITRATE-HYDRALAZINE 20-37.5 MG PO TABS
1.0000 | ORAL_TABLET | Freq: Three times a day (TID) | ORAL | Status: DC
  Administered 2016-02-28 – 2016-03-03 (×10): 1 via ORAL
  Filled 2016-02-28 (×18): qty 1

## 2016-02-28 MED ORDER — FUROSEMIDE 10 MG/ML IJ SOLN
60.0000 mg | Freq: Two times a day (BID) | INTRAMUSCULAR | Status: DC
Start: 2016-02-28 — End: 2016-03-03
  Administered 2016-02-28 – 2016-03-03 (×8): 60 mg via INTRAVENOUS
  Filled 2016-02-28 (×8): qty 6

## 2016-02-28 MED ORDER — METOPROLOL SUCCINATE ER 50 MG PO TB24
75.0000 mg | ORAL_TABLET | Freq: Two times a day (BID) | ORAL | Status: DC
  Administered 2016-02-28 (×2): 75 mg via ORAL
  Filled 2016-02-28 (×4): qty 1

## 2016-02-28 MED ORDER — INSULIN ASPART 100 UNIT/ML ~~LOC~~ SOLN
0.0000 [IU] | Freq: Three times a day (TID) | SUBCUTANEOUS | Status: DC
  Administered 2016-02-28 – 2016-03-02 (×3): 1 [IU] via SUBCUTANEOUS
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
## 2016-02-28 NOTE — ED Notes (Signed)
Dr. Casper Harrison notified that pt had abnormal troponin last night at 2000 (.0.04) and inquired about needing repeat level.  He states he will come to see pt soon, that no repeat troponin was needed at this time.

## 2016-02-28 NOTE — ED Notes (Signed)
Meal given to pt.

## 2016-02-28 NOTE — ED Notes (Signed)
Gave pt peanut butter crackers
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
## 2016-02-28 NOTE — Telephone Encounter (Signed)
Call placed to patient. No answer. No VM.  

## 2016-02-29 DIAGNOSIS — E784 Other hyperlipidemia: Secondary | ICD-10-CM

## 2016-02-29 DIAGNOSIS — I4891 Unspecified atrial fibrillation: Secondary | ICD-10-CM

## 2016-02-29 DIAGNOSIS — N182 Chronic kidney disease, stage 2 (mild): Secondary | ICD-10-CM

## 2016-02-29 LAB — VITAMIN B12: Vitamin B-12: 653 pg/mL (ref 180–914)

## 2016-02-29 LAB — BASIC METABOLIC PANEL
ANION GAP: 8 (ref 5–15)
BUN: 28 mg/dL — ABNORMAL HIGH (ref 6–20)
CALCIUM: 8.2 mg/dL — AB (ref 8.9–10.3)
CO2: 32 mmol/L (ref 22–32)
Chloride: 98 mmol/L — ABNORMAL LOW (ref 101–111)
Creatinine, Ser: 1.39 mg/dL — ABNORMAL HIGH (ref 0.61–1.24)
GFR, EST AFRICAN AMERICAN: 59 mL/min — AB (ref >60)
GFR, EST NON AFRICAN AMERICAN: 51 mL/min — AB (ref >60)
GLUCOSE: 76 mg/dL (ref 65–99)
Potassium: 3.6 mmol/L (ref 3.5–5.1)
SODIUM: 138 mmol/L (ref 135–145)

## 2016-02-29 LAB — GLUCOSE, CAPILLARY
GLUCOSE-CAPILLARY: 78 mg/dL (ref 65–99)
GLUCOSE-CAPILLARY: 130 mg/dL — AB (ref 65–99)
GLUCOSE-CAPILLARY: 90 mg/dL (ref 65–99)
Glucose-Capillary: 105 mg/dL — ABNORMAL HIGH (ref 65–99)

## 2016-02-29 MED ORDER — METOPROLOL SUCCINATE ER 50 MG PO TB24
50.0000 mg | ORAL_TABLET | Freq: Two times a day (BID) | ORAL | Status: DC
  Filled 2016-02-29 (×2): qty 1

## 2016-02-29 NOTE — Telephone Encounter (Signed)
Call placed to patient daughter.   Advised when patient is released from hospital, he will need OV.

## 2016-02-29 NOTE — Progress Notes (Signed)
Paged MD before giving BP med as patient was bradycardic, MD advised hold on BP medicine for the night. Will continue to monitor.
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
## 2016-03-01 DIAGNOSIS — E1121 Type 2 diabetes mellitus with diabetic nephropathy: Secondary | ICD-10-CM

## 2016-03-01 LAB — CBC
HCT: 38.5 % — ABNORMAL LOW (ref 39.0–52.0)
Hemoglobin: 12.8 g/dL — ABNORMAL LOW (ref 13.0–17.0)
MCH: 29.9 pg (ref 26.0–34.0)
MCHC: 33.2 g/dL (ref 30.0–36.0)
MCV: 90 fL (ref 78.0–100.0)
PLATELETS: 150 10*3/uL (ref 150–400)
RBC: 4.28 MIL/uL (ref 4.22–5.81)
RDW: 20.8 % — ABNORMAL HIGH (ref 11.5–15.5)
WBC: 6.3 10*3/uL (ref 4.0–10.5)

## 2016-03-01 LAB — MAGNESIUM: MAGNESIUM: 1.6 mg/dL — AB (ref 1.7–2.4)

## 2016-03-01 LAB — BASIC METABOLIC PANEL
ANION GAP: 10 (ref 5–15)
BUN: 24 mg/dL — ABNORMAL HIGH (ref 6–20)
CALCIUM: 8 mg/dL — AB (ref 8.9–10.3)
CO2: 32 mmol/L (ref 22–32)
Chloride: 95 mmol/L — ABNORMAL LOW (ref 101–111)
Creatinine, Ser: 1.37 mg/dL — ABNORMAL HIGH (ref 0.61–1.24)
GFR, EST AFRICAN AMERICAN: 60 mL/min — AB (ref >60)
GFR, EST NON AFRICAN AMERICAN: 51 mL/min — AB (ref >60)
Glucose, Bld: 71 mg/dL (ref 65–99)
POTASSIUM: 3 mmol/L — AB (ref 3.5–5.1)
Sodium: 137 mmol/L (ref 135–145)

## 2016-03-01 LAB — GLUCOSE, CAPILLARY
GLUCOSE-CAPILLARY: 79 mg/dL (ref 65–99)
GLUCOSE-CAPILLARY: 118 mg/dL — AB (ref 65–99)
GLUCOSE-CAPILLARY: 117 mg/dL — AB (ref 65–99)
GLUCOSE-CAPILLARY: 186 mg/dL — AB (ref 65–99)

## 2016-03-01 LAB — HEMOGLOBIN A1C
HEMOGLOBIN A1C: 6.7 % — AB (ref 4.8–5.6)
MEAN PLASMA GLUCOSE: 146 mg/dL

## 2016-03-01 LAB — HIV ANTIBODY (ROUTINE TESTING W REFLEX): HIV Screen 4th Generation wRfx: NONREACTIVE

## 2016-03-01 MED ORDER — POTASSIUM CHLORIDE CRYS ER 20 MEQ PO TBCR
40.0000 meq | EXTENDED_RELEASE_TABLET | Freq: Every day | ORAL | Status: DC
  Administered 2016-03-01 – 2016-03-03 (×3): 40 meq via ORAL
  Filled 2016-03-01 (×3): qty 2

## 2016-03-01 MED ORDER — METOPROLOL SUCCINATE ER 25 MG PO TB24
25.0000 mg | ORAL_TABLET | Freq: Two times a day (BID) | ORAL | Status: DC
  Administered 2016-03-01 – 2016-03-02 (×2): 25 mg via ORAL
  Filled 2016-03-01 (×2): qty 1

## 2016-03-01 MED ORDER — MAGNESIUM SULFATE 4 GM/100ML IV SOLN
4.0000 g | Freq: Once | INTRAVENOUS | Status: AC
  Administered 2016-03-01: 4 g via INTRAVENOUS
  Filled 2016-03-01: qty 100
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
## 2016-03-02 LAB — GLUCOSE, CAPILLARY
GLUCOSE-CAPILLARY: 100 mg/dL — AB (ref 65–99)
GLUCOSE-CAPILLARY: 92 mg/dL (ref 65–99)
GLUCOSE-CAPILLARY: 148 mg/dL — AB (ref 65–99)
GLUCOSE-CAPILLARY: 115 mg/dL — AB (ref 65–99)

## 2016-03-02 LAB — BASIC METABOLIC PANEL
ANION GAP: 10 (ref 5–15)
BUN: 22 mg/dL — ABNORMAL HIGH (ref 6–20)
CALCIUM: 8.3 mg/dL — AB (ref 8.9–10.3)
CO2: 36 mmol/L — ABNORMAL HIGH (ref 22–32)
Chloride: 93 mmol/L — ABNORMAL LOW (ref 101–111)
Creatinine, Ser: 1.25 mg/dL — ABNORMAL HIGH (ref 0.61–1.24)
GFR, EST NON AFRICAN AMERICAN: 57 mL/min — AB (ref >60)
GLUCOSE: 85 mg/dL (ref 65–99)
POTASSIUM: 3.4 mmol/L — AB (ref 3.5–5.1)
Sodium: 139 mmol/L (ref 135–145)

## 2016-03-02 LAB — MAGNESIUM: MAGNESIUM: 2.1 mg/dL (ref 1.7–2.4)
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
## 2016-03-03 LAB — BASIC METABOLIC PANEL
ANION GAP: 8 (ref 5–15)
BUN: 20 mg/dL (ref 6–20)
CHLORIDE: 95 mmol/L — AB (ref 101–111)
CO2: 33 mmol/L — ABNORMAL HIGH (ref 22–32)
CREATININE: 1.23 mg/dL (ref 0.61–1.24)
Calcium: 8.2 mg/dL — ABNORMAL LOW (ref 8.9–10.3)
GFR calc non Af Amer: 59 mL/min — ABNORMAL LOW (ref >60)
Glucose, Bld: 81 mg/dL (ref 65–99)
Potassium: 3.2 mmol/L — ABNORMAL LOW (ref 3.5–5.1)
SODIUM: 136 mmol/L (ref 135–145)

## 2016-03-03 LAB — GLUCOSE, CAPILLARY: GLUCOSE-CAPILLARY: 89 mg/dL (ref 65–99)

## 2016-03-03 MED ORDER — POTASSIUM CHLORIDE CRYS ER 20 MEQ PO TBCR: 40.0000 meq | EXTENDED_RELEASE_TABLET | Freq: Every day | ORAL | 0 refills | Status: DC

## 2016-03-03 MED ORDER — METOPROLOL SUCCINATE ER 25 MG PO TB24: 25.0000 mg | ORAL_TABLET | Freq: Every day | ORAL | Status: DC

## 2016-03-03 MED ORDER — ISOSORB DINITRATE-HYDRALAZINE 20-37.5 MG PO TABS: 1.0000 | ORAL_TABLET | Freq: Three times a day (TID) | ORAL | 0 refills | Status: DC

## 2016-03-03 MED ORDER — TORSEMIDE 20 MG PO TABS
40.0000 mg | ORAL_TABLET | Freq: Two times a day (BID) | ORAL | Status: DC
  Administered 2016-03-03: 40 mg via ORAL
  Filled 2016-03-03: qty 2

## 2016-03-03 MED ORDER — TORSEMIDE 20 MG PO TABS: 40.0000 mg | ORAL_TABLET | Freq: Two times a day (BID) | ORAL | 0 refills | Status: DC

## 2016-03-03 MED ORDER — METOPROLOL SUCCINATE ER 25 MG PO TB24: 25.0000 mg | ORAL_TABLET | Freq: Every day | ORAL | 0 refills | Status: DC

## 2016-03-03 NOTE — Progress Notes (Signed)
Discharge instructions given, verbalized understanding, out in stable condition via w/c with staff. 
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
## 2016-03-03 NOTE — Progress Notes (Signed)
Progress Note  Patient Name: Phillip Wilson Date of Encounter: 03/03/2016  Primary Cardiologist: Bronson Ing    Subjective   Breathing OK  NO CP  Wants to go home   Inpatient Medications    Scheduled Meds: . amiodarone  200 mg Oral Daily  . atorvastatin  40 mg Oral Daily  . edoxaban  60 mg Oral Daily  . furosemide  60 mg Intravenous Q12H  . insulin aspart  0-5 Units Subcutaneous QHS  . insulin aspart  0-9 Units Subcutaneous TID WC  . isosorbide-hydrALAZINE  1 tablet Oral TID  . metoprolol succinate  25 mg Oral BID WC  . potassium chloride  40 mEq Oral Daily   Continuous Infusions:  PRN Meds:    Vital Signs    Vitals:   03/02/16 1300 03/02/16 1646 03/02/16 2053 03/03/16 0607  BP: 106/72  133/73 131/76  Pulse: 63 64 (!) 59 61  Resp: 18  18 18   Temp: 98 F (36.7 C)  97.7 F (36.5 C) 98.3 F (36.8 C)  TempSrc: Oral  Oral Oral  SpO2: 98%  100% 96%  Weight:    183 lb 6.4 oz (83.2 kg)  Height:        Intake/Output Summary (Last 24 hours) at 03/03/16 0814 Last data filed at 03/03/16 2458  Gross per 24 hour  Intake              480 ml  Output             5800 ml  Net            -5320 ml   NEt negative 4.2 L   Filed Weights   03/01/16 0508 03/02/16 0212 03/03/16 0607  Weight: 202 lb 9.6 oz (91.9 kg) 201 lb 8 oz (91.4 kg) 183 lb 6.4 oz (83.2 kg)    Telemetry   SR /SB  40s t o50s    ECG     Physical Exam   GEN: No acute distress.   Neck: JVP is increased   Cardiac: RRR, no murmurs, rubs, or gallops.  Respiratory: MIld rales  GI: Soft, nontender, non-distended  MS: 1 + edema with sores   Neuro:  Nonfocal  Psych: Normal affect   Labs    Chemistry Recent Labs Lab 02/27/16 1935  03/01/16 0606 03/02/16 0606 03/03/16 0503  NA 140  < > 137 139 136  K 5.8*  < > 3.0* 3.4* 3.2*  CL 99*  < > 95* 93* 95*  CO2 28  < > 32 36* 33*  GLUCOSE 102*  < > 71 85 81  BUN 27*  < > 24* 22* 20  CREATININE 1.68*  < > 1.37* 1.25* 1.23  CALCIUM 9.2  < > 8.0*  8.3* 8.2*  PROT 6.7  --   --   --   --   ALBUMIN 3.3*  --   --   --   --   AST 38  --   --   --   --   ALT 22  --   --   --   --   ALKPHOS 212*  --   --   --   --   BILITOT 3.3*  --   --   --   --   GFRNONAA 40*  < > 51* 57* 59*  GFRAA 47*  < > 60* >60 >60  ANIONGAP 13  < > 10 10 8   < > = values in this  interval not displayed.   Hematology  Recent Labs Lab 02/27/16 1935 03/01/16 0606  WBC 9.4 6.3  RBC 4.95 4.28  HGB 14.6 12.8*  HCT 45.8 38.5*  MCV 92.5 90.0  MCH 29.5 29.9  MCHC 31.9 33.2  RDW 21.5* 20.8*  PLT 152 150    Cardiac Enzymes  Recent Labs Lab 02/27/16 1959  TROPONINI 0.04*   No results for input(s): TROPIPOC in the last 168 hours.   BNP  Recent Labs Lab 02/27/16 1959  BNP >4,500.0*     DDimer No results for input(s): DDIMER in the last 168 hours.   Radiology    No results found.  Cardiac Studies     Patient Profile      71 y.o. male with progressive exertional dyspnea and bilateral leg swelling hospitalized for acute on chronic combined systolic and diastolicheart failure. Assessment & Plan    1  Acute on chronic systolid /diastolic CHF  Good weight loss and diuresis. Still With some LE edema but ok to d/c on PO lasix will arrange outpatient f/u with Dr  Jacinta Shoe.   2  Atrial fibrilation  Stop digoxen beta blocker decreased continue savaysa and amiodarone  3  HTN  Fair control of BP  4  CKD  Cr improved   5  Hyperkalemia  REsolved     Signed, Jenkins Rouge, MD  03/03/2016, 8:14 AM

## 2016-03-11 ENCOUNTER — Ambulatory Visit (INDEPENDENT_AMBULATORY_CARE_PROVIDER_SITE_OTHER): Payer: BLUE CROSS/BLUE SHIELD | Admitting: Cardiovascular Disease

## 2016-03-11 ENCOUNTER — Encounter: Payer: Self-pay | Admitting: Cardiovascular Disease

## 2016-03-11 VITALS — BP 134/84 | HR 83 | Ht 68.0 in | Wt 183.0 lb

## 2016-03-11 DIAGNOSIS — N182 Chronic kidney disease, stage 2 (mild): Secondary | ICD-10-CM | POA: Diagnosis not present

## 2016-03-11 DIAGNOSIS — I5042 Chronic combined systolic (congestive) and diastolic (congestive) heart failure: Secondary | ICD-10-CM

## 2016-03-11 DIAGNOSIS — E7800 Pure hypercholesterolemia, unspecified: Secondary | ICD-10-CM

## 2016-03-11 DIAGNOSIS — I4819 Other persistent atrial fibrillation: Secondary | ICD-10-CM

## 2016-03-11 DIAGNOSIS — I10 Essential (primary) hypertension: Secondary | ICD-10-CM | POA: Diagnosis not present

## 2016-03-11 DIAGNOSIS — I429 Cardiomyopathy, unspecified: Secondary | ICD-10-CM

## 2016-03-11 DIAGNOSIS — I481 Persistent atrial fibrillation: Secondary | ICD-10-CM | POA: Diagnosis not present

## 2016-03-11 NOTE — Patient Instructions (Signed)
Your physician recommends that you schedule a follow-up appointment in: 2 months Dr Bronson Ing   Your physician recommends that you continue on your current medications as directed. Please refer to the Current Medication list given to you today.     Thank you for choosing [facility] !
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
## 2016-03-12 ENCOUNTER — Ambulatory Visit (INDEPENDENT_AMBULATORY_CARE_PROVIDER_SITE_OTHER): Payer: BLUE CROSS/BLUE SHIELD | Admitting: Family Medicine

## 2016-03-12 ENCOUNTER — Encounter: Payer: Self-pay | Admitting: Family Medicine

## 2016-03-12 VITALS — BP 132/86 | HR 54 | Temp 98.8°F | Resp 14 | Ht 68.0 in | Wt 187.0 lb

## 2016-03-12 DIAGNOSIS — I10 Essential (primary) hypertension: Secondary | ICD-10-CM | POA: Diagnosis not present

## 2016-03-12 DIAGNOSIS — I5042 Chronic combined systolic (congestive) and diastolic (congestive) heart failure: Secondary | ICD-10-CM | POA: Diagnosis not present

## 2016-03-12 DIAGNOSIS — E1121 Type 2 diabetes mellitus with diabetic nephropathy: Secondary | ICD-10-CM

## 2016-03-12 NOTE — Assessment & Plan Note (Signed)
Pressures controlled. Recommend that he go get the BiDil medication that he needs for his heart failure. We'll go ahead and release him back to work he does not do a lot of activity that exerts himself. We'll see how he does has any difficulties he will let me know.

## 2016-03-12 NOTE — Progress Notes (Signed)
   Subjective:    Patient ID: Phillip Wilson, male    DOB: 1945-06-05, 71 y.o.   MRN: [id number]  Patient presents for Hospital F/U (CHF- pt states that he is feeling much improved and would like to return to work) Pt here for hospital follow-up. He has been admitted multiple times over the past couple of months secondary to CHF and A. fib. His last visit was with his cardiologist yesterday and at that time he was noted to be euvolemic. On review of his medications he has everything except for the BiDil which she states is at the pharmacy. He states he feels much rather his breathing is much better and he is trying to watch his diet. He is ready to return to work. He works as a custodian typically driving a buffering machine  He did have his blood sugar checked at his last hospitalization at the end of January his A1c result 6.7% diet-controlled.  Reviewed cardiology note from yesterday and his recent labs. Soft potassium for his hypokalemia    Review Of Systems:  GEN- denies fatigue, fever, weight loss,weakness, recent illness HEENT- denies eye drainage, change in vision, nasal discharge, CVS- denies chest pain, palpitations RESP- denies SOB, cough, wheeze ABD- denies N/V, change in stools, abd pain GU- denies dysuria, hematuria, dribbling, incontinence MSK- denies joint pain, muscle aches, injury Neuro- denies headache, dizziness, syncope, seizure activity       Objective:    BP 132/86   Pulse (!) 54   Temp 98.8 F (37.1 C) (Oral)   Resp 14   Ht 5\' 8"  (1.727 m)   Wt 187 lb (84.8 kg)   SpO2 98%   BMI 28.43 kg/m  GEN- NAD, alert and oriented x3 HEENT- PERRL, EOMI, non injected sclera, pink conjunctiva, MMM, oropharynx clear Neck- Supple, noJVD  CVS- RRR, soft systolic murmur  RESP-CTAB ABD-NABS,soft,NT,ND EXT- pitting edema to mid shins, previous blisters with scabs/healed  Pulses- Radial  2+       Assessment & Plan:      Problem List Items Addressed This Visit     Type 2 diabetes mellitus with diabetic nephropathy, without long-term current use of insulin (HCC) - Primary    Diet control and not adding anything to his regimen at this time. He will just watch his diet      Essential hypertension    Pressures controlled. Recommend that he go get the BiDil medication that he needs for his heart failure. We'll go ahead and release him back to work he does not do a lot of activity that exerts himself. We'll see how he does has any difficulties he will let me know.      CHF (congestive heart failure) ([place])    Is currently compensated today recommended he keep up with his medications which his daughter is helping him with. Follow with cardiology as recommended we'll follow-up my office in 3 months         Note: This dictation was prepared with Dragon dictation along with smaller phrase technology. Any transcriptional errors that result from this process are unintentional.

## 2016-03-12 NOTE — Assessment & Plan Note (Signed)
Diet control and not adding anything to his regimen at this time. He will just watch his diet

## 2016-03-12 NOTE — Patient Instructions (Addendum)
Take the water pill twice a day  Get your BIDIL pill, take three times a day as directed on bottle F/U 3 months

## 2016-03-12 NOTE — Assessment & Plan Note (Signed)
Is currently compensated today recommended he keep up with his medications which his daughter is helping him with. Follow with cardiology as recommended we'll follow-up my office in 3 months

## 2016-03-13 ENCOUNTER — Telehealth: Payer: Self-pay | Admitting: *Deleted

## 2016-03-13 NOTE — Telephone Encounter (Signed)
Received VM from patient.   Reports that he has questions in regards to prescription.   Call placed to patient. [place].

## 2016-03-20 NOTE — Telephone Encounter (Signed)
Multiple calls placed to patient with no answer and no return call.   Message to be closed.  

## 2016-05-19 ENCOUNTER — Encounter: Payer: Self-pay | Admitting: Cardiovascular Disease

## 2016-05-19 ENCOUNTER — Ambulatory Visit: Payer: BLUE CROSS/BLUE SHIELD | Admitting: Cardiovascular Disease

## 2016-06-09 ENCOUNTER — Ambulatory Visit: Payer: Medicare Other | Admitting: Family Medicine

## 2016-08-18 ENCOUNTER — Encounter: Payer: Self-pay | Admitting: Family Medicine

## 2016-08-18 ENCOUNTER — Ambulatory Visit (INDEPENDENT_AMBULATORY_CARE_PROVIDER_SITE_OTHER): Payer: BLUE CROSS/BLUE SHIELD | Admitting: Family Medicine

## 2016-08-18 VITALS — BP 130/74 | HR 64 | Temp 98.1°F | Resp 14 | Ht 68.0 in | Wt 194.0 lb

## 2016-08-18 DIAGNOSIS — I10 Essential (primary) hypertension: Secondary | ICD-10-CM | POA: Diagnosis not present

## 2016-08-18 DIAGNOSIS — I482 Chronic atrial fibrillation, unspecified: Secondary | ICD-10-CM

## 2016-08-18 DIAGNOSIS — N182 Chronic kidney disease, stage 2 (mild): Secondary | ICD-10-CM

## 2016-08-18 DIAGNOSIS — I5042 Chronic combined systolic (congestive) and diastolic (congestive) heart failure: Secondary | ICD-10-CM

## 2016-08-18 DIAGNOSIS — E1121 Type 2 diabetes mellitus with diabetic nephropathy: Secondary | ICD-10-CM | POA: Diagnosis not present

## 2016-08-18 DIAGNOSIS — E784 Other hyperlipidemia: Secondary | ICD-10-CM | POA: Diagnosis not present

## 2016-08-18 DIAGNOSIS — E7849 Other hyperlipidemia: Secondary | ICD-10-CM

## 2016-08-18 MED ORDER — TORSEMIDE 20 MG PO TABS: 40.0000 mg | ORAL_TABLET | Freq: Two times a day (BID) | ORAL | 2 refills | Status: DC

## 2016-08-18 MED ORDER — METOPROLOL SUCCINATE ER 25 MG PO TB24: 25.0000 mg | ORAL_TABLET | Freq: Every day | ORAL | 2 refills | Status: DC

## 2016-08-18 MED ORDER — AMIODARONE HCL 200 MG PO TABS: 200.0000 mg | ORAL_TABLET | Freq: Every day | ORAL | 3 refills | Status: DC

## 2016-08-18 MED ORDER — ATORVASTATIN CALCIUM 40 MG PO TABS: 40.0000 mg | ORAL_TABLET | Freq: Every day | ORAL | 2 refills | Status: DC

## 2016-08-18 MED ORDER — POTASSIUM CHLORIDE CRYS ER 20 MEQ PO TBCR: 40.0000 meq | EXTENDED_RELEASE_TABLET | Freq: Every day | ORAL | 2 refills | Status: DC

## 2016-08-18 MED ORDER — EDOXABAN TOSYLATE 60 MG PO TABS: 60.0000 mg | ORAL_TABLET | Freq: Every day | ORAL | 2 refills | Status: DC

## 2016-08-18 MED ORDER — ISOSORB DINITRATE-HYDRALAZINE 20-37.5 MG PO TABS: 1.0000 | ORAL_TABLET | Freq: Three times a day (TID) | ORAL | 0 refills | Status: DC

## 2016-08-18 NOTE — Assessment & Plan Note (Signed)
He appears compensated but does not have all his medications I think that he needs a smaller pharmacy back and work with him or [facility]. We discussed changing him to [facility]. Our office contacted the pharmacy and let them know about his specific needs. I refill all of his medications including his blood thinner. He is to continue his diuretic and restart the metoprolol 25 mg daily. I will obtain some labs today. His blood pressure overall looks good and he is staying active and feels well

## 2016-08-18 NOTE — Progress Notes (Signed)
Subjective:    Patient ID: Phillip Wilson, male    DOB: May 12, 1945, 71 y.o.   MRN: [id number]  Patient presents for Follow-up  Patient here to follow-up on her medical problems. As review his medications. He is out of most of his medications despite him having some refills. I think he gets very confused at the pharmacy and he is not sure what exactly he is supposed to be on consult them. Reviewed his last cardiology note. He does not have his blood thinner he states it is diuretics or at home he has 2 different bottles of metoprolol although both are empty he also has 2 bottles of digoxin He denies any chest pain shortness of breath states it is fluid levels have been staying down. His weight is up 7 pounds since I seen him in February. He has no new concerns today. He is back to walking. 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  Review Of Systems:  GEN- denies fatigue, fever, weight loss,weakness, recent illness HEENT- denies eye drainage, change in vision, nasal discharge, CVS- denies chest pain, palpitations RESP- denies SOB, cough, wheeze ABD- denies N/V, change in stools, abd pain GU- denies dysuria, hematuria, dribbling, incontinence MSK- denies joint pain, muscle aches, injury Neuro- denies headache, dizziness, syncope, seizure activity       Objective:    BP 130/74   Pulse 64   Temp 98.1 F (36.7 C) (Oral)   Resp 14   Ht 5\' 8"  (1.727 m)   Wt 194 lb (88 kg)   SpO2 97%   BMI 29.50 kg/m  GEN- NAD, alert and oriented x3 HEENT- PERRL, EOMI, non injected sclera, pink conjunctiva, MMM, oropharynx clear Neck- Supple, noJVD  CVS- RRR, soft systolic murmur  RESP-CTAB ABD-NABS,soft,NT,ND Ext- trace pedal edema       Assessment & Plan:      Problem List Items Addressed This Visit    Hyperlipidemia   Relevant Medications   edoxaban (SAVAYSA) 60 MG TABS  tablet   torsemide (DEMADEX) 20 MG tablet   metoprolol succinate (TOPROL-XL) 25 MG 24 hr tablet   isosorbide-hydrALAZINE (BIDIL) 20-37.5 MG tablet   atorvastatin (LIPITOR) 40 MG tablet   amiodarone (PACERONE) 200 MG tablet   Other Relevant Orders   Lipid panel   CKD (chronic kidney disease), stage II   Atrial fibrillation (HCC)   Relevant Medications   edoxaban (SAVAYSA) 60 MG TABS tablet   torsemide (DEMADEX) 20 MG tablet   metoprolol succinate (TOPROL-XL) 25 MG 24 hr tablet   isosorbide-hydrALAZINE (BIDIL) 20-37.5 MG tablet   atorvastatin (LIPITOR) 40 MG tablet   amiodarone (PACERONE) 200 MG tablet   Essential hypertension   Relevant Medications   edoxaban (SAVAYSA) 60 MG TABS tablet   torsemide (DEMADEX) 20 MG tablet   metoprolol succinate (TOPROL-XL) 25 MG 24 hr tablet   isosorbide-hydrALAZINE (BIDIL) 20-37.5 MG tablet   atorvastatin (LIPITOR) 40 MG tablet   amiodarone (PACERONE) 200 MG tablet   Type 2 diabetes mellitus with diabetic nephropathy, without long-term current use of insulin (HCC)    Diet-controlled recheck A1c discuss avoiding fried foods salty foods and sweets      Relevant Medications   atorvastatin (LIPITOR) 40 MG tablet   Other Relevant Orders   CBC with Differential/Platelet   Comprehensive metabolic panel   Hemoglobin A1c   CHF (congestive heart failure) (HCC) -  Primary    He appears compensated but does not have all his medications I think that he needs a smaller pharmacy back and work with him or 101. We discussed changing him to [facility]. Our office contacted the pharmacy and let them know about his specific needs. I refill all of his medications including his blood thinner. He is to continue his diuretic and restart the metoprolol 25 mg daily. I will obtain some labs today. His blood pressure overall looks good and he is staying active and feels well      Relevant Medications   edoxaban (SAVAYSA) 60 MG TABS tablet   torsemide (DEMADEX) 20  MG tablet   metoprolol succinate (TOPROL-XL) 25 MG 24 hr tablet   isosorbide-hydrALAZINE (BIDIL) 20-37.5 MG tablet   atorvastatin (LIPITOR) 40 MG tablet   amiodarone (PACERONE) 200 MG tablet      Note: This dictation was prepared with Dragon dictation along with smaller phrase technology. Any transcriptional errors that result from this process are unintentional.

## 2016-08-18 NOTE — Patient Instructions (Addendum)
F/U 3 months for PHYSICAL

## 2016-08-18 NOTE — Assessment & Plan Note (Signed)
Diet-controlled recheck A1c discuss avoiding fried foods salty foods and sweets

## 2016-08-19 LAB — LIPID PANEL
CHOL/HDL RATIO: 2.9 ratio (ref <5.0)
CHOLESTEROL: 153 mg/dL (ref <200)
HDL: 52 mg/dL (ref >40)
LDL Cholesterol: 68 mg/dL (ref <100)
Triglycerides: 167 mg/dL — ABNORMAL HIGH (ref <150)
VLDL: 33 mg/dL — ABNORMAL HIGH (ref <30)

## 2016-08-19 LAB — CBC WITH DIFFERENTIAL/PLATELET
BASOS ABS: 53 {cells}/uL (ref 0–200)
Basophils Relative: 1 %
EOS PCT: 6 %
Eosinophils Absolute: 318 cells/uL (ref 15–500)
HCT: 41.9 % (ref 38.5–50.0)
HEMOGLOBIN: 13.5 g/dL (ref 13.0–17.0)
LYMPHS ABS: 901 {cells}/uL (ref 850–3900)
LYMPHS PCT: 17 %
MCH: 27.6 pg (ref 27.0–33.0)
MCHC: 32.2 g/dL (ref 32.0–36.0)
MCV: 85.5 fL (ref 80.0–100.0)
Monocytes Absolute: 636 cells/uL (ref 200–950)
Monocytes Relative: 12 %
NEUTROS PCT: 64 %
Neutro Abs: 3392 cells/uL (ref 1500–7800)
Platelets: 135 10*3/uL — ABNORMAL LOW (ref 140–400)
RBC: 4.9 MIL/uL (ref 4.20–5.80)
RDW: 15.8 % — ABNORMAL HIGH (ref 11.0–15.0)
WBC: 5.3 10*3/uL (ref 3.8–10.8)

## 2016-08-19 LAB — COMPREHENSIVE METABOLIC PANEL
ALBUMIN: 3.9 g/dL (ref 3.6–5.1)
ALT: 10 U/L (ref 9–46)
AST: 27 U/L (ref 10–35)
Alkaline Phosphatase: 166 U/L — ABNORMAL HIGH (ref 40–115)
BUN: 10 mg/dL (ref 7–25)
CALCIUM: 8.8 mg/dL (ref 8.6–10.3)
CO2: 30 mmol/L (ref 20–31)
CREATININE: 0.99 mg/dL (ref 0.70–1.25)
Chloride: 105 mmol/L (ref 98–110)
Glucose, Bld: 118 mg/dL — ABNORMAL HIGH (ref 70–99)
Potassium: 4 mmol/L (ref 3.5–5.3)
SODIUM: 140 mmol/L (ref 135–146)
Total Bilirubin: 1.2 mg/dL (ref 0.2–1.2)
Total Protein: 6.7 g/dL (ref 6.1–8.1)

## 2016-08-19 LAB — HEMOGLOBIN A1C
Hgb A1c MFr Bld: 5.9 % — ABNORMAL HIGH (ref <5.7)
MEAN PLASMA GLUCOSE: 123 mg/dL

## 2016-10-07 DIAGNOSIS — I509 Heart failure, unspecified: Secondary | ICD-10-CM | POA: Diagnosis not present

## 2016-10-07 DIAGNOSIS — Z719 Counseling, unspecified: Secondary | ICD-10-CM | POA: Diagnosis not present

## 2016-10-07 DIAGNOSIS — Z008 Encounter for other general examination: Secondary | ICD-10-CM | POA: Diagnosis not present

## 2016-10-07 DIAGNOSIS — K219 Gastro-esophageal reflux disease without esophagitis: Secondary | ICD-10-CM | POA: Diagnosis not present

## 2016-10-07 DIAGNOSIS — E669 Obesity, unspecified: Secondary | ICD-10-CM | POA: Diagnosis not present

## 2016-10-07 DIAGNOSIS — I10 Essential (primary) hypertension: Secondary | ICD-10-CM | POA: Diagnosis not present

## 2016-11-19 ENCOUNTER — Encounter: Payer: Medicare Other | Admitting: Family Medicine

## 2016-11-27 DIAGNOSIS — Z23 Encounter for immunization: Secondary | ICD-10-CM | POA: Diagnosis not present

## 2017-04-10 DIAGNOSIS — Z008 Encounter for other general examination: Secondary | ICD-10-CM | POA: Diagnosis not present

## 2017-04-10 DIAGNOSIS — I10 Essential (primary) hypertension: Secondary | ICD-10-CM | POA: Diagnosis not present

## 2017-04-10 DIAGNOSIS — E669 Obesity, unspecified: Secondary | ICD-10-CM | POA: Diagnosis not present

## 2017-04-10 DIAGNOSIS — K219 Gastro-esophageal reflux disease without esophagitis: Secondary | ICD-10-CM | POA: Diagnosis not present

## 2017-04-23 ENCOUNTER — Ambulatory Visit (INDEPENDENT_AMBULATORY_CARE_PROVIDER_SITE_OTHER): Payer: BLUE CROSS/BLUE SHIELD | Admitting: Cardiovascular Disease

## 2017-04-23 ENCOUNTER — Encounter: Payer: Self-pay | Admitting: Cardiovascular Disease

## 2017-04-23 VITALS — BP 180/110 | HR 68 | Ht 70.0 in | Wt 197.0 lb

## 2017-04-23 DIAGNOSIS — I5042 Chronic combined systolic (congestive) and diastolic (congestive) heart failure: Secondary | ICD-10-CM

## 2017-04-23 DIAGNOSIS — I429 Cardiomyopathy, unspecified: Secondary | ICD-10-CM | POA: Diagnosis not present

## 2017-04-23 DIAGNOSIS — E782 Mixed hyperlipidemia: Secondary | ICD-10-CM | POA: Diagnosis not present

## 2017-04-23 DIAGNOSIS — I482 Chronic atrial fibrillation, unspecified: Secondary | ICD-10-CM

## 2017-04-23 DIAGNOSIS — Z9114 Patient's other noncompliance with medication regimen: Secondary | ICD-10-CM

## 2017-04-23 DIAGNOSIS — I4819 Other persistent atrial fibrillation: Secondary | ICD-10-CM

## 2017-04-23 DIAGNOSIS — Z91148 Patient's other noncompliance with medication regimen for other reason: Secondary | ICD-10-CM

## 2017-04-23 DIAGNOSIS — I10 Essential (primary) hypertension: Secondary | ICD-10-CM

## 2017-04-23 DIAGNOSIS — I481 Persistent atrial fibrillation: Secondary | ICD-10-CM

## 2017-04-23 MED ORDER — ATORVASTATIN CALCIUM 40 MG PO TABS: 40.0000 mg | ORAL_TABLET | Freq: Every day | ORAL | 2 refills | Status: DC

## 2017-04-23 MED ORDER — TORSEMIDE 20 MG PO TABS: 40.0000 mg | ORAL_TABLET | Freq: Two times a day (BID) | ORAL | 2 refills | Status: DC

## 2017-04-23 MED ORDER — METOPROLOL SUCCINATE ER 25 MG PO TB24: 25.0000 mg | ORAL_TABLET | Freq: Every day | ORAL | 2 refills | Status: DC

## 2017-04-23 MED ORDER — POTASSIUM CHLORIDE CRYS ER 20 MEQ PO TBCR: 40.0000 meq | EXTENDED_RELEASE_TABLET | Freq: Every day | ORAL | 2 refills | Status: DC

## 2017-04-23 MED ORDER — AMIODARONE HCL 200 MG PO TABS: 200.0000 mg | ORAL_TABLET | Freq: Every day | ORAL | 3 refills | Status: DC

## 2017-04-23 MED ORDER — EDOXABAN TOSYLATE 60 MG PO TABS: 60.0000 mg | ORAL_TABLET | Freq: Every day | ORAL | 2 refills | Status: DC

## 2017-04-23 MED ORDER — ISOSORB DINITRATE-HYDRALAZINE 20-37.5 MG PO TABS: 1.0000 | ORAL_TABLET | Freq: Three times a day (TID) | ORAL | 11 refills | Status: DC

## 2017-04-23 NOTE — Addendum Note (Signed)
Addended by: Barbarann Ehlers A on: 04/23/2017 04:33 PM   Modules accepted: Orders
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
## 2017-04-23 NOTE — Patient Instructions (Signed)
Your physician recommends that you schedule a follow-up appointment in:  3 months with Dr Bronson Ing   Get lab work today:TSH, LFT's    Your physician recommends that you continue on your current medications as directed. Please refer to the Current Medication list given to you today.    If you need a refill on your cardiac medications before your next appointment, please call your pharmacy.     No tests ordered today      Thank you for choosing [facility] !

## 2017-07-22 DIAGNOSIS — E782 Mixed hyperlipidemia: Secondary | ICD-10-CM | POA: Diagnosis not present

## 2017-07-22 LAB — HEPATIC FUNCTION PANEL
AG RATIO: 1.3 (calc) (ref 1.0–2.5)
ALBUMIN MSPROF: 4 g/dL (ref 3.6–5.1)
ALT: 7 U/L — AB (ref 9–46)
AST: 19 U/L (ref 10–35)
Alkaline phosphatase (APISO): 105 U/L (ref 40–115)
BILIRUBIN DIRECT: 0.3 mg/dL — AB (ref 0.0–0.2)
BILIRUBIN TOTAL: 1.3 mg/dL — AB (ref 0.2–1.2)
GLOBULIN: 3.1 g/dL (ref 1.9–3.7)
Indirect Bilirubin: 1 mg/dL (calc) (ref 0.2–1.2)
Total Protein: 7.1 g/dL (ref 6.1–8.1)

## 2017-07-22 LAB — TSH: TSH: 2.69 mIU/L (ref 0.40–4.50)

## 2017-07-23 ENCOUNTER — Telehealth: Payer: Self-pay | Admitting: *Deleted

## 2017-07-23 NOTE — Telephone Encounter (Signed)
Called pt, no answer, unable to leave msg.

## 2017-07-23 NOTE — Telephone Encounter (Signed)
-----   Message from Herminio Commons, MD sent at 07/23/2017  1:37 PM EDT ----- TSH is normal.  Bilirubin is only mildly elevated and has been known to be elevated much higher than this in the past.  Please send a copy to PCP.

## 2017-07-24 ENCOUNTER — Ambulatory Visit: Payer: BLUE CROSS/BLUE SHIELD | Admitting: Cardiovascular Disease

## 2017-09-29 ENCOUNTER — Encounter: Payer: Self-pay | Admitting: Cardiovascular Disease

## 2017-10-22 DIAGNOSIS — M7989 Other specified soft tissue disorders: Secondary | ICD-10-CM | POA: Diagnosis not present

## 2017-10-22 DIAGNOSIS — M109 Gout, unspecified: Secondary | ICD-10-CM | POA: Diagnosis not present

## 2017-10-22 DIAGNOSIS — I4891 Unspecified atrial fibrillation: Secondary | ICD-10-CM | POA: Diagnosis not present

## 2017-10-22 DIAGNOSIS — Z79899 Other long term (current) drug therapy: Secondary | ICD-10-CM | POA: Diagnosis not present

## 2017-10-22 DIAGNOSIS — I509 Heart failure, unspecified: Secondary | ICD-10-CM | POA: Diagnosis not present

## 2017-10-22 DIAGNOSIS — I110 Hypertensive heart disease with heart failure: Secondary | ICD-10-CM | POA: Diagnosis not present

## 2017-10-27 ENCOUNTER — Ambulatory Visit: Payer: BLUE CROSS/BLUE SHIELD | Admitting: Cardiovascular Disease

## 2017-11-05 ENCOUNTER — Encounter: Payer: Self-pay | Admitting: Internal Medicine

## 2017-12-10 ENCOUNTER — Ambulatory Visit: Payer: BLUE CROSS/BLUE SHIELD | Admitting: Cardiovascular Disease

## 2017-12-11 ENCOUNTER — Encounter: Payer: Self-pay | Admitting: Cardiovascular Disease

## 2017-12-22 ENCOUNTER — Encounter: Payer: Self-pay | Admitting: Family Medicine

## 2017-12-22 ENCOUNTER — Ambulatory Visit (INDEPENDENT_AMBULATORY_CARE_PROVIDER_SITE_OTHER): Payer: BLUE CROSS/BLUE SHIELD | Admitting: Family Medicine

## 2017-12-22 VITALS — BP 130/80 | HR 79 | Temp 97.6°F | Resp 16 | Ht 68.0 in | Wt 212.0 lb

## 2017-12-22 DIAGNOSIS — E669 Obesity, unspecified: Secondary | ICD-10-CM | POA: Diagnosis not present

## 2017-12-22 DIAGNOSIS — I10 Essential (primary) hypertension: Secondary | ICD-10-CM

## 2017-12-22 DIAGNOSIS — Z719 Counseling, unspecified: Secondary | ICD-10-CM | POA: Diagnosis not present

## 2017-12-22 DIAGNOSIS — R7303 Prediabetes: Secondary | ICD-10-CM

## 2017-12-22 DIAGNOSIS — I5042 Chronic combined systolic (congestive) and diastolic (congestive) heart failure: Secondary | ICD-10-CM | POA: Diagnosis not present

## 2017-12-22 DIAGNOSIS — I482 Chronic atrial fibrillation, unspecified: Secondary | ICD-10-CM

## 2017-12-22 DIAGNOSIS — N182 Chronic kidney disease, stage 2 (mild): Secondary | ICD-10-CM | POA: Diagnosis not present

## 2017-12-22 DIAGNOSIS — Z008 Encounter for other general examination: Secondary | ICD-10-CM | POA: Diagnosis not present

## 2017-12-22 MED ORDER — POTASSIUM CHLORIDE CRYS ER 20 MEQ PO TBCR: 40.0000 meq | EXTENDED_RELEASE_TABLET | Freq: Every day | ORAL | 2 refills | Status: DC

## 2017-12-22 MED ORDER — ATORVASTATIN CALCIUM 40 MG PO TABS: 40.0000 mg | ORAL_TABLET | Freq: Every day | ORAL | 2 refills | Status: DC

## 2017-12-22 MED ORDER — AMIODARONE HCL 200 MG PO TABS: 200.0000 mg | ORAL_TABLET | Freq: Every day | ORAL | 3 refills | Status: DC

## 2017-12-22 MED ORDER — TORSEMIDE 20 MG PO TABS: 40.0000 mg | ORAL_TABLET | Freq: Two times a day (BID) | ORAL | 2 refills | Status: DC

## 2017-12-22 MED ORDER — METOPROLOL SUCCINATE ER 25 MG PO TB24: 25.0000 mg | ORAL_TABLET | Freq: Every day | ORAL | 2 refills | Status: DC

## 2017-12-22 NOTE — Patient Instructions (Signed)
Allergies as of 12/22/2017      Reactions   Entresto [sacubitril-valsartan]    BLE Edema/ Blisters      Medication List        Accurate as of 12/22/17 12:12 PM. Always use your most recent med list.          amiodarone 200 MG tablet Commonly known as:  PACERONE Take 1 tablet (200 mg total) by mouth daily.   atorvastatin 40 MG tablet Commonly known as:  LIPITOR Take 1 tablet (40 mg total) by mouth daily.   edoxaban 60 MG Tabs tablet Commonly known as:  SAVAYSA Take 60 mg by mouth daily.   isosorbide-hydrALAZINE 20-37.5 MG tablet Commonly known as:  BIDIL Take 1 tablet by mouth 3 (three) times daily.   metoprolol succinate 25 MG 24 hr tablet Commonly known as:  TOPROL-XL Take 1 tablet (25 mg total) by mouth daily.   potassium chloride SA 20 MEQ tablet Commonly known as:  K-DUR,KLOR-CON Take 2 tablets (40 mEq total) by mouth daily.   torsemide 20 MG tablet Commonly known as:  DEMADEX Take 2 tablets (40 mg total) by mouth 2 (two) times daily.

## 2017-12-22 NOTE — Progress Notes (Signed)
Patient ID: Phillip Wilson, male    DOB: [birth year], 72 y.o.   MRN: [id number]  PCP: Alycia Rossetti, MD  Chief Complaint  Patient presents with  . Leg Swelling    Patient has c/o leg swelling, and sob on exertion    Subjective:   Phillip Wilson is a 72 y.o. male, presents to clinic with CC of leg swelling worsening over the past several weeks.  He has some confusion and is difficult to understand, he daughter comes with him to the appointment and helps give hx.  Swelling is severe and worse than his baseline.  He says he can usually wrap his hands around his ankles but now has b/l ankle and shin swelling and increased weight.  He says his normal weight is 180 lbs but now is > 200 lbs.  His actual weights are in a chart below, but previous weights more 180-190's and currently 212.  He has pertinent MHx of systolic and diastolic CHF, non-ischemic cardiomyopathy with EF 15-20% (per 07/06/2015 ECHO), PAF, HTN, HLD, CKD stage II, and hx of T2DM - diet controlled last A1C 5.9 07/2016.  He says he is out ofmost of his meds for "a while" pt estimates a month.  Daughter with him helps with meds sometimes, the pt's sister actually goes to his home and puts them in pill box, no meds brought in today.   He is established with cardiology - last visit was March 2019, but he forgot his follow up appt earlier this month and he is rescheduled early next year.  The pt nor any of his family members attempted to get meds refilled.   He has mild orthopnea, palpitations and exertional dyspnea associated with his LE edema and weight gain.  The pt and daughter state he also has been eating a lot more and has probably gained some weight that is not fluid. He denies CP, chest pressure, PND, near syncope, fatigue.   He lives alone, still works.  He keeps his cell phone on him.   Daughter : Donella Stade [phone] Call for labs - she will call with meds once she checks with her aunt who helps get pt meds in pill  boxes Pt gives permission to call [facility] re: private health information, meds and lab results.   Wt Readings from Last 5 Encounters:  12/22/17 212 lb (96.2 kg)  04/23/17 197 lb (89.4 kg)  08/18/16 194 lb (88 kg)  03/12/16 187 lb (84.8 kg)  03/11/16 183 lb (83 kg)   Pt also brings in a form from his work where his cholesterol was checked and fasting blood sugar was elevated so they were told to check A1C with PCP.   Patient Active Problem List   Diagnosis Date Noted  . CHF (congestive heart failure) ([place]) 02/27/2016  . Hypokalemia 02/05/2016  . CKD (chronic kidney disease), stage II 02/05/2016  . Bilateral lower extremity edema   . Gastroesophageal reflux disease   . Type 2 diabetes mellitus with diabetic nephropathy, without long-term current use of insulin ([place])   . Cardiomyopathy ([place]) 12/28/2010  . Atrial fibrillation ([place]) 12/28/2010  . Vitamin D deficiency 09/03/2010  . Hyperlipidemia 05/05/2006  . Essential hypertension 04/02/2006     Prior to Admission medications   Medication Sig Start Date End Date Taking? Authorizing Provider  amiodarone (PACERONE) 200 MG tablet Take 1 tablet (200 mg total) by mouth daily. 04/23/17  Yes Herminio Commons, MD  atorvastatin (LIPITOR) 40 MG tablet Take  1 tablet (40 mg total) by mouth daily. 04/23/17  Yes Herminio Commons, MD  edoxaban (SAVAYSA) 60 MG TABS tablet Take 60 mg by mouth daily. 04/23/17  Yes Herminio Commons, MD  isosorbide-hydrALAZINE (BIDIL) 20-37.5 MG tablet Take 1 tablet by mouth 3 (three) times daily. 04/23/17  Yes Herminio Commons, MD  metoprolol succinate (TOPROL-XL) 25 MG 24 hr tablet Take 1 tablet (25 mg total) by mouth daily. 04/23/17  Yes Herminio Commons, MD  potassium chloride SA (K-DUR,KLOR-CON) 20 MEQ tablet Take 2 tablets (40 mEq total) by mouth daily. 04/23/17  Yes Herminio Commons, MD  torsemide (DEMADEX) 20 MG tablet Take 2 tablets (40 mg total) by mouth 2 (two) times daily. 04/23/17  Yes  Herminio Commons, MD     Allergies  Allergen Reactions  . Entresto [Sacubitril-Valsartan]     BLE Edema/ Blisters      Family History  Problem Relation Age of Onset  . Dementia Mother   . COPD Father   . Diabetes Father   . Coronary artery disease Father      Social History   Socioeconomic History  . Marital status: Single    Spouse name: Not on file  . Number of children: 2  . Years of education: Not on file  . Highest education level: Not on file  Occupational History  . Occupation: Maintenance department    Comment: [employer]  . Financial resource strain: Not on file  . Food insecurity:    Worry: Not on file    Inability: Not on file  . Transportation needs:    Medical: Not on file    Non-medical: Not on file  Tobacco Use  . Smoking status: Never Smoker  . Smokeless tobacco: Never Used  Substance and Sexual Activity  . Alcohol use: Yes    Alcohol/week: 20.0 standard drinks    Types: 10 Standard drinks or equivalent, 10 Cans of beer per week    Comment: beer  . Drug use: No  . Sexual activity: Yes  Lifestyle  . Physical activity:    Days per week: Not on file    Minutes per session: Not on file  . Stress: Not on file  Relationships  . Social connections:    Talks on phone: Not on file    Gets together: Not on file    Attends religious service: Not on file    Active member of club or organization: Not on file    Attends meetings of clubs or organizations: Not on file    Relationship status: Not on file  . Intimate partner violence:    Fear of current or ex partner: Not on file    Emotionally abused: Not on file    Physically abused: Not on file    Forced sexual activity: Not on file  Other Topics Concern  . Not on file  Social History Narrative  . Not on file     Review of Systems  Constitutional: Negative.  Negative for appetite change, chills, diaphoresis, fatigue and fever.  HENT: Negative.   Eyes: Negative.     Respiratory: Positive for shortness of breath. Negative for apnea, cough, choking, chest tightness, wheezing and stridor.   Cardiovascular: Positive for leg swelling. Negative for chest pain.  Gastrointestinal: Negative.   Endocrine: Negative.   Genitourinary: Negative.  Negative for decreased urine volume and difficulty urinating.  Musculoskeletal: Negative.   Skin: Negative.  Negative for color change, pallor and rash.  Allergic/Immunologic: Negative.   Neurological: Negative.  Negative for dizziness, syncope, weakness, light-headedness and numbness.  Hematological: Negative.   Psychiatric/Behavioral: Negative.   All other systems reviewed and are negative.      Objective:    Vitals:   12/22/17 1134  BP: 130/80  Pulse: 79  Resp: 16  Temp: 97.6 F (36.4 C)  TempSrc: Oral  SpO2: 97%  Weight: 212 lb (96.2 kg)  Height: 5\' 8"  (1.727 m)      Physical Exam  Constitutional: He appears well-developed and well-nourished. He is cooperative.  Non-toxic appearance. He does not have a sickly appearance. He does not appear ill. No distress.  HENT:  Head: Normocephalic and atraumatic.  Nose: Nose normal.  Eyes: Conjunctivae, EOM and lids are normal. Right eye exhibits no discharge. Left eye exhibits no discharge. No scleral icterus.  Neck: Trachea normal and normal range of motion. Neck supple. JVD present. No hepatojugular reflux present. No tracheal deviation present.  Cardiovascular: Normal rate. An irregularly irregular rhythm present. Exam reveals no friction rub.  Pulses:      Radial pulses are 2+ on the right side, and 2+ on the left side.  Severe b/l ankle and pretibial pitting edema   Pulmonary/Chest: Effort normal. No stridor. No respiratory distress. He has no decreased breath sounds. He has no wheezes. He has no rhonchi. He has no rales.  Musculoskeletal: Normal range of motion.  Neurological: He is alert. He exhibits normal muscle tone. Coordination normal.  Skin: Skin  is warm and dry. No rash noted. He is not diaphoretic.  Psychiatric: He has a normal mood and affect. His behavior is normal.  Speech difficult to understand (baseline)  Nursing note and vitals reviewed.   Ambulatory Pulse ox with 2 laps around clinic SpO2 >96%     Assessment & Plan:      ICD-10-CM   1. CKD (chronic kidney disease), stage II N18.2   2. Chronic combined systolic and diastolic congestive heart failure (HCC) I50.42 Brain natriuretic peptide  3. Essential hypertension Q33 COMPLETE METABOLIC PANEL WITH GFR  4. Chronic atrial fibrillation I48.20   5. Prediabetes R73.03 Hemoglobin A1c    Recheck renal function, BNP, resume meds and close follow up to recheck fluid status, work of breathing (today no desat), recheck LE edema, HR/rhythm.   Today appears fluid overloaded to b/l LE, weight up, some sx of HF, although does not appear to have pulm edema/crackles on exam or desaturation/hypoxia with ambulation in clinic.  Meds refilled today, but will adjust based on labs.  He brought in elevated fasting blood sugar from labs done at work - added on A1C to check that, but will likely wait until next week to start any meds if he needs them.   Per last cardiology note 03/2017 - pt has hx of non-compliance (or at least is confused, not consistently taking meds and lives alone) LVEF was 15-20% with CHF and cardiomyopathy, cardiologist wanted to gets meds stable and repeat ECHO, however pt missed appointment earlier this month, and so has come in today with CC of LE edema, and slowly revealed that he is out of meds.    He lives alone, has his sister and daughter helping - they both are going to talk and call me with any more info about meds at home, what he's been out of and for how long  Patient was urged to stay with somebody over the next several days, encouraged to keep a cell phone on him in  case he has any difficulty with breathing, heart rate, near-syncope.  He was also cautioned  over the holiday weekend to avoid salty foods because any additional fluid overload could be very dangerous or overwhelming to his system.  He was encouraged to call 911 or go to the ER with any passing out episodes, chest pain or pressure, worsening shortness of breath.  Daughter verbalized understanding of these precautions.  And are to follow-up next week   Delsa Grana, PA-C 12/22/17 11:51 AM

## 2017-12-22 NOTE — Progress Notes (Signed)
Patient presents with leg swelling, and SOB on exertion. Walked patient around the office for 2 laps with the pulse oximetry and Oxygen saturation was 99-96%. On the second lap patient became SOB and had to stop to catch breath at that time Oxygen saturation was 96%

## 2017-12-23 ENCOUNTER — Encounter: Payer: Self-pay | Admitting: Family Medicine

## 2017-12-23 LAB — COMPLETE METABOLIC PANEL WITH GFR
AG RATIO: 1.5 (calc) (ref 1.0–2.5)
ALBUMIN MSPROF: 3.3 g/dL — AB (ref 3.6–5.1)
ALT: 49 U/L — ABNORMAL HIGH (ref 9–46)
AST: 80 U/L — ABNORMAL HIGH (ref 10–35)
Alkaline phosphatase (APISO): 160 U/L — ABNORMAL HIGH (ref 40–115)
BUN / CREAT RATIO: 19 (calc) (ref 6–22)
BUN: 48 mg/dL — AB (ref 7–25)
CO2: 24 mmol/L (ref 20–32)
Calcium: 8.8 mg/dL (ref 8.6–10.3)
Chloride: 101 mmol/L (ref 98–110)
Creat: 2.54 mg/dL — ABNORMAL HIGH (ref 0.70–1.18)
GFR, EST AFRICAN AMERICAN: 29 mL/min/{1.73_m2} — AB (ref >=60)
GFR, Est Non African American: 25 mL/min/{1.73_m2} — ABNORMAL LOW (ref >=60)
GLOBULIN: 2.2 g/dL (ref 1.9–3.7)
Glucose, Bld: 105 mg/dL — ABNORMAL HIGH (ref 65–99)
POTASSIUM: 4.2 mmol/L (ref 3.5–5.3)
SODIUM: 138 mmol/L (ref 135–146)
Total Bilirubin: 2.6 mg/dL — ABNORMAL HIGH (ref 0.2–1.2)
Total Protein: 5.5 g/dL — ABNORMAL LOW (ref 6.1–8.1)

## 2017-12-23 LAB — HEMOGLOBIN A1C
Hgb A1c MFr Bld: 6.6 % of total Hgb — ABNORMAL HIGH (ref <5.7)
MEAN PLASMA GLUCOSE: 143 (calc)
eAG (mmol/L): 7.9 (calc)

## 2017-12-23 LAB — BRAIN NATRIURETIC PEPTIDE: Brain Natriuretic Peptide: 3627 pg/mL — ABNORMAL HIGH (ref <100)

## 2017-12-28 ENCOUNTER — Emergency Department (HOSPITAL_COMMUNITY): Payer: BLUE CROSS/BLUE SHIELD

## 2017-12-28 ENCOUNTER — Encounter (HOSPITAL_COMMUNITY): Payer: Self-pay | Admitting: Emergency Medicine

## 2017-12-28 ENCOUNTER — Inpatient Hospital Stay (HOSPITAL_COMMUNITY): Payer: BLUE CROSS/BLUE SHIELD

## 2017-12-28 ENCOUNTER — Other Ambulatory Visit: Payer: Self-pay

## 2017-12-28 ENCOUNTER — Inpatient Hospital Stay (HOSPITAL_COMMUNITY)
Admission: EM | Admit: 2017-12-28 | Discharge: 2018-01-13 | DRG: 291 | Disposition: A | Discharge status: Skilled Nursing Facility | Payer: BLUE CROSS/BLUE SHIELD | Source: Other Acute Inpatient Hospital | Attending: Internal Medicine | Admitting: Internal Medicine

## 2017-12-28 DIAGNOSIS — I2729 Other secondary pulmonary hypertension: Secondary | ICD-10-CM | POA: Diagnosis not present

## 2017-12-28 DIAGNOSIS — Z9911 Dependence on respirator [ventilator] status: Secondary | ICD-10-CM | POA: Diagnosis not present

## 2017-12-28 DIAGNOSIS — I083 Combined rheumatic disorders of mitral, aortic and tricuspid valves: Secondary | ICD-10-CM | POA: Diagnosis not present

## 2017-12-28 DIAGNOSIS — J9601 Acute respiratory failure with hypoxia: Secondary | ICD-10-CM | POA: Diagnosis not present

## 2017-12-28 DIAGNOSIS — J811 Chronic pulmonary edema: Secondary | ICD-10-CM | POA: Diagnosis not present

## 2017-12-28 DIAGNOSIS — J9602 Acute respiratory failure with hypercapnia: Secondary | ICD-10-CM | POA: Diagnosis not present

## 2017-12-28 DIAGNOSIS — E876 Hypokalemia: Secondary | ICD-10-CM | POA: Diagnosis not present

## 2017-12-28 DIAGNOSIS — M1712 Unilateral primary osteoarthritis, left knee: Secondary | ICD-10-CM | POA: Diagnosis present

## 2017-12-28 DIAGNOSIS — R310 Gross hematuria: Secondary | ICD-10-CM | POA: Diagnosis not present

## 2017-12-28 DIAGNOSIS — K761 Chronic passive congestion of liver: Secondary | ICD-10-CM | POA: Diagnosis present

## 2017-12-28 DIAGNOSIS — N1832 Chronic kidney disease, stage 3b: Secondary | ICD-10-CM

## 2017-12-28 DIAGNOSIS — I472 Ventricular tachycardia: Secondary | ICD-10-CM | POA: Diagnosis not present

## 2017-12-28 DIAGNOSIS — K219 Gastro-esophageal reflux disease without esophagitis: Secondary | ICD-10-CM | POA: Diagnosis present

## 2017-12-28 DIAGNOSIS — N183 Chronic kidney disease, stage 3 unspecified: Secondary | ICD-10-CM

## 2017-12-28 DIAGNOSIS — E1121 Type 2 diabetes mellitus with diabetic nephropathy: Secondary | ICD-10-CM | POA: Diagnosis present

## 2017-12-28 DIAGNOSIS — R262 Difficulty in walking, not elsewhere classified: Secondary | ICD-10-CM | POA: Diagnosis not present

## 2017-12-28 DIAGNOSIS — J9622 Acute and chronic respiratory failure with hypercapnia: Secondary | ICD-10-CM | POA: Diagnosis not present

## 2017-12-28 DIAGNOSIS — J9811 Atelectasis: Secondary | ICD-10-CM | POA: Diagnosis not present

## 2017-12-28 DIAGNOSIS — I4819 Other persistent atrial fibrillation: Secondary | ICD-10-CM | POA: Diagnosis not present

## 2017-12-28 DIAGNOSIS — R0989 Other specified symptoms and signs involving the circulatory and respiratory systems: Secondary | ICD-10-CM | POA: Diagnosis not present

## 2017-12-28 DIAGNOSIS — R0602 Shortness of breath: Secondary | ICD-10-CM

## 2017-12-28 DIAGNOSIS — D696 Thrombocytopenia, unspecified: Secondary | ICD-10-CM | POA: Diagnosis present

## 2017-12-28 DIAGNOSIS — R0600 Dyspnea, unspecified: Secondary | ICD-10-CM

## 2017-12-28 DIAGNOSIS — I959 Hypotension, unspecified: Secondary | ICD-10-CM | POA: Diagnosis not present

## 2017-12-28 DIAGNOSIS — R7989 Other specified abnormal findings of blood chemistry: Secondary | ICD-10-CM | POA: Diagnosis not present

## 2017-12-28 DIAGNOSIS — G459 Transient cerebral ischemic attack, unspecified: Secondary | ICD-10-CM | POA: Diagnosis present

## 2017-12-28 DIAGNOSIS — Z888 Allergy status to other drugs, medicaments and biological substances status: Secondary | ICD-10-CM

## 2017-12-28 DIAGNOSIS — Y732 Prosthetic and other implants, materials and accessory gastroenterology and urology devices associated with adverse incidents: Secondary | ICD-10-CM | POA: Diagnosis not present

## 2017-12-28 DIAGNOSIS — I081 Rheumatic disorders of both mitral and tricuspid valves: Secondary | ICD-10-CM | POA: Diagnosis present

## 2017-12-28 DIAGNOSIS — Z79899 Other long term (current) drug therapy: Secondary | ICD-10-CM

## 2017-12-28 DIAGNOSIS — I509 Heart failure, unspecified: Secondary | ICD-10-CM

## 2017-12-28 DIAGNOSIS — E785 Hyperlipidemia, unspecified: Secondary | ICD-10-CM | POA: Diagnosis not present

## 2017-12-28 DIAGNOSIS — E1122 Type 2 diabetes mellitus with diabetic chronic kidney disease: Secondary | ICD-10-CM

## 2017-12-28 DIAGNOSIS — N179 Acute kidney failure, unspecified: Secondary | ICD-10-CM

## 2017-12-28 DIAGNOSIS — Z751 Person awaiting admission to adequate facility elsewhere: Secondary | ICD-10-CM

## 2017-12-28 DIAGNOSIS — T884XXA Failed or difficult intubation, initial encounter: Secondary | ICD-10-CM

## 2017-12-28 DIAGNOSIS — I480 Paroxysmal atrial fibrillation: Secondary | ICD-10-CM | POA: Diagnosis present

## 2017-12-28 DIAGNOSIS — I69891 Dysphagia following other cerebrovascular disease: Secondary | ICD-10-CM | POA: Diagnosis not present

## 2017-12-28 DIAGNOSIS — I130 Hypertensive heart and chronic kidney disease with heart failure and stage 1 through stage 4 chronic kidney disease, or unspecified chronic kidney disease: Principal | ICD-10-CM | POA: Diagnosis present

## 2017-12-28 DIAGNOSIS — I361 Nonrheumatic tricuspid (valve) insufficiency: Secondary | ICD-10-CM | POA: Diagnosis not present

## 2017-12-28 DIAGNOSIS — I110 Hypertensive heart disease with heart failure: Secondary | ICD-10-CM | POA: Diagnosis not present

## 2017-12-28 DIAGNOSIS — I10 Essential (primary) hypertension: Secondary | ICD-10-CM

## 2017-12-28 DIAGNOSIS — Z9114 Patient's other noncompliance with medication regimen: Secondary | ICD-10-CM

## 2017-12-28 DIAGNOSIS — R402143 Coma scale, eyes open, spontaneous, at hospital admission: Secondary | ICD-10-CM | POA: Diagnosis present

## 2017-12-28 DIAGNOSIS — R319 Hematuria, unspecified: Secondary | ICD-10-CM | POA: Diagnosis not present

## 2017-12-28 DIAGNOSIS — I5023 Acute on chronic systolic (congestive) heart failure: Secondary | ICD-10-CM

## 2017-12-28 DIAGNOSIS — R29810 Facial weakness: Secondary | ICD-10-CM | POA: Diagnosis not present

## 2017-12-28 DIAGNOSIS — K449 Diaphragmatic hernia without obstruction or gangrene: Secondary | ICD-10-CM | POA: Diagnosis present

## 2017-12-28 DIAGNOSIS — I639 Cerebral infarction, unspecified: Secondary | ICD-10-CM | POA: Diagnosis not present

## 2017-12-28 DIAGNOSIS — I255 Ischemic cardiomyopathy: Secondary | ICD-10-CM | POA: Diagnosis present

## 2017-12-28 DIAGNOSIS — R600 Localized edema: Secondary | ICD-10-CM | POA: Diagnosis not present

## 2017-12-28 DIAGNOSIS — N400 Enlarged prostate without lower urinary tract symptoms: Secondary | ICD-10-CM | POA: Diagnosis present

## 2017-12-28 DIAGNOSIS — T83098A Other mechanical complication of other indwelling urethral catheter, initial encounter: Secondary | ICD-10-CM | POA: Diagnosis not present

## 2017-12-28 DIAGNOSIS — M109 Gout, unspecified: Secondary | ICD-10-CM | POA: Diagnosis present

## 2017-12-28 DIAGNOSIS — I4891 Unspecified atrial fibrillation: Secondary | ICD-10-CM | POA: Diagnosis not present

## 2017-12-28 DIAGNOSIS — M7989 Other specified soft tissue disorders: Secondary | ICD-10-CM

## 2017-12-28 DIAGNOSIS — E782 Mixed hyperlipidemia: Secondary | ICD-10-CM | POA: Diagnosis present

## 2017-12-28 DIAGNOSIS — I313 Pericardial effusion (noninflammatory): Secondary | ICD-10-CM | POA: Diagnosis present

## 2017-12-28 DIAGNOSIS — I248 Other forms of acute ischemic heart disease: Secondary | ICD-10-CM | POA: Diagnosis not present

## 2017-12-28 DIAGNOSIS — Z452 Encounter for adjustment and management of vascular access device: Secondary | ICD-10-CM | POA: Diagnosis not present

## 2017-12-28 DIAGNOSIS — I340 Nonrheumatic mitral (valve) insufficiency: Secondary | ICD-10-CM | POA: Diagnosis not present

## 2017-12-28 DIAGNOSIS — I5043 Acute on chronic combined systolic (congestive) and diastolic (congestive) heart failure: Secondary | ICD-10-CM | POA: Diagnosis not present

## 2017-12-28 DIAGNOSIS — Z4682 Encounter for fitting and adjustment of non-vascular catheter: Secondary | ICD-10-CM | POA: Diagnosis not present

## 2017-12-28 DIAGNOSIS — R790 Abnormal level of blood mineral: Secondary | ICD-10-CM | POA: Diagnosis present

## 2017-12-28 DIAGNOSIS — Z9111 Patient's noncompliance with dietary regimen: Secondary | ICD-10-CM

## 2017-12-28 DIAGNOSIS — J90 Pleural effusion, not elsewhere classified: Secondary | ICD-10-CM | POA: Diagnosis not present

## 2017-12-28 DIAGNOSIS — M6281 Muscle weakness (generalized): Secondary | ICD-10-CM | POA: Diagnosis not present

## 2017-12-28 DIAGNOSIS — I428 Other cardiomyopathies: Secondary | ICD-10-CM | POA: Diagnosis not present

## 2017-12-28 DIAGNOSIS — J9690 Respiratory failure, unspecified, unspecified whether with hypoxia or hypercapnia: Secondary | ICD-10-CM | POA: Diagnosis not present

## 2017-12-28 DIAGNOSIS — R402363 Coma scale, best motor response, obeys commands, at hospital admission: Secondary | ICD-10-CM | POA: Diagnosis present

## 2017-12-28 DIAGNOSIS — R402253 Coma scale, best verbal response, oriented, at hospital admission: Secondary | ICD-10-CM | POA: Diagnosis present

## 2017-12-28 DIAGNOSIS — I9589 Other hypotension: Secondary | ICD-10-CM | POA: Diagnosis not present

## 2017-12-28 DIAGNOSIS — I429 Cardiomyopathy, unspecified: Secondary | ICD-10-CM | POA: Diagnosis not present

## 2017-12-28 DIAGNOSIS — I4901 Ventricular fibrillation: Secondary | ICD-10-CM | POA: Diagnosis not present

## 2017-12-28 DIAGNOSIS — I519 Heart disease, unspecified: Secondary | ICD-10-CM | POA: Diagnosis not present

## 2017-12-28 DIAGNOSIS — J9600 Acute respiratory failure, unspecified whether with hypoxia or hypercapnia: Secondary | ICD-10-CM | POA: Diagnosis present

## 2017-12-28 DIAGNOSIS — Y92230 Patient room in hospital as the place of occurrence of the external cause: Secondary | ICD-10-CM | POA: Diagnosis not present

## 2017-12-28 DIAGNOSIS — I69922 Dysarthria following unspecified cerebrovascular disease: Secondary | ICD-10-CM | POA: Diagnosis not present

## 2017-12-28 DIAGNOSIS — Z823 Family history of stroke: Secondary | ICD-10-CM | POA: Diagnosis not present

## 2017-12-28 DIAGNOSIS — R682 Dry mouth, unspecified: Secondary | ICD-10-CM | POA: Diagnosis present

## 2017-12-28 DIAGNOSIS — I444 Left anterior fascicular block: Secondary | ICD-10-CM | POA: Diagnosis present

## 2017-12-28 DIAGNOSIS — Z7982 Long term (current) use of aspirin: Secondary | ICD-10-CM

## 2017-12-28 DIAGNOSIS — J690 Pneumonitis due to inhalation of food and vomit: Secondary | ICD-10-CM | POA: Diagnosis not present

## 2017-12-28 DIAGNOSIS — R509 Fever, unspecified: Secondary | ICD-10-CM

## 2017-12-28 DIAGNOSIS — R471 Dysarthria and anarthria: Secondary | ICD-10-CM | POA: Diagnosis present

## 2017-12-28 DIAGNOSIS — T45515A Adverse effect of anticoagulants, initial encounter: Secondary | ICD-10-CM | POA: Diagnosis not present

## 2017-12-28 HISTORY — DX: Chronic kidney disease, stage 3 unspecified: N18.30

## 2017-12-28 HISTORY — DX: Chronic kidney disease, stage 3 (moderate): N18.3

## 2017-12-28 LAB — BASIC METABOLIC PANEL
ANION GAP: 11 (ref 5–15)
BUN: 48 mg/dL — ABNORMAL HIGH (ref 8–23)
CALCIUM: 8.3 mg/dL — AB (ref 8.9–10.3)
CO2: 23 mmol/L (ref 22–32)
Chloride: 102 mmol/L (ref 98–111)
Creatinine, Ser: 2.18 mg/dL — ABNORMAL HIGH (ref 0.61–1.24)
GFR calc Af Amer: 34 mL/min — ABNORMAL LOW (ref >60)
GFR calc non Af Amer: 30 mL/min — ABNORMAL LOW (ref >60)
GLUCOSE: 200 mg/dL — AB (ref 70–99)
POTASSIUM: 3.8 mmol/L (ref 3.5–5.1)
Sodium: 136 mmol/L (ref 135–145)

## 2017-12-28 LAB — CBC WITH DIFFERENTIAL/PLATELET
ABS IMMATURE GRANULOCYTES: 0.02 10*3/uL (ref 0.00–0.07)
BASOS ABS: 0 10*3/uL (ref 0.0–0.1)
BASOS PCT: 1 %
EOS ABS: 0.1 10*3/uL (ref 0.0–0.5)
Eosinophils Relative: 2 %
HCT: 46.3 % (ref 39.0–52.0)
Hemoglobin: 14.6 g/dL (ref 13.0–17.0)
IMMATURE GRANULOCYTES: 0 %
Lymphocytes Relative: 11 %
Lymphs Abs: 0.8 10*3/uL (ref 0.7–4.0)
MCH: 26.4 pg (ref 26.0–34.0)
MCHC: 31.5 g/dL (ref 30.0–36.0)
MCV: 83.9 fL (ref 80.0–100.0)
MONOS PCT: 7 %
Monocytes Absolute: 0.5 10*3/uL (ref 0.1–1.0)
NEUTROS ABS: 5.5 10*3/uL (ref 1.7–7.7)
NEUTROS PCT: 79 %
PLATELETS: 123 10*3/uL — AB (ref 150–400)
RBC: 5.52 MIL/uL (ref 4.22–5.81)
RDW: 17.4 % — AB (ref 11.5–15.5)
WBC: 6.9 10*3/uL (ref 4.0–10.5)
nRBC: 0 % (ref 0.0–0.2)

## 2017-12-28 LAB — TROPONIN I
Troponin I: 0.08 ng/mL (ref <0.03)
Troponin I: 0.08 ng/mL (ref <0.03)

## 2017-12-28 LAB — HEMOGLOBIN A1C
Hgb A1c MFr Bld: 6.6 % — ABNORMAL HIGH (ref 4.8–5.6)
Mean Plasma Glucose: 142.72 mg/dL

## 2017-12-28 LAB — CBG MONITORING, ED: GLUCOSE-CAPILLARY: 132 mg/dL — AB (ref 70–99)

## 2017-12-28 LAB — PROTIME-INR
INR: 1.82
Prothrombin Time: 20.8 seconds — ABNORMAL HIGH (ref 11.4–15.2)

## 2017-12-28 LAB — MAGNESIUM: MAGNESIUM: 2 mg/dL (ref 1.7–2.4)

## 2017-12-28 LAB — TSH: TSH: 7.662 u[IU]/mL — AB (ref 0.350–4.500)

## 2017-12-28 LAB — APTT: APTT: 41 s — AB (ref 24–36)

## 2017-12-28 LAB — BRAIN NATRIURETIC PEPTIDE: B Natriuretic Peptide: 3321 pg/mL — ABNORMAL HIGH (ref 0.0–100.0)

## 2017-12-28 LAB — GLUCOSE, CAPILLARY: Glucose-Capillary: 157 mg/dL — ABNORMAL HIGH (ref 70–99)

## 2017-12-28 MED ORDER — AMIODARONE IV BOLUS ONLY 150 MG/100ML
150.0000 mg | Freq: Once | INTRAVENOUS | Status: AC
  Administered 2017-12-29: 150 mg via INTRAVENOUS

## 2017-12-28 MED ORDER — AMIODARONE IV BOLUS ONLY 150 MG/100ML
INTRAVENOUS | Status: AC
  Filled 2017-12-28: qty 100

## 2017-12-28 MED ORDER — PROPOFOL 1000 MG/100ML IV EMUL
INTRAVENOUS | Status: AC
  Filled 2017-12-28: qty 100

## 2017-12-28 MED ORDER — METOPROLOL TARTRATE 5 MG/5ML IV SOLN
2.5000 mg | Freq: Once | INTRAVENOUS | Status: AC
  Administered 2017-12-28: 2.5 mg via INTRAVENOUS
  Filled 2017-12-28: qty 5

## 2017-12-28 MED ORDER — INSULIN ASPART 100 UNIT/ML ~~LOC~~ SOLN: 0.0000 [IU] | Freq: Three times a day (TID) | SUBCUTANEOUS | Status: DC

## 2017-12-28 MED ORDER — EDOXABAN TOSYLATE 30 MG PO TABS
30.0000 mg | ORAL_TABLET | ORAL | Status: DC
  Administered 2017-12-28: 30 mg via ORAL
  Filled 2017-12-28 (×3): qty 30

## 2017-12-28 MED ORDER — AMIODARONE HCL IN DEXTROSE 360-4.14 MG/200ML-% IV SOLN
30.0000 mg/h | INTRAVENOUS | Status: DC
  Administered 2017-12-29 – 2018-01-11 (×27): 30 mg/h via INTRAVENOUS
  Filled 2017-12-28 (×29): qty 200

## 2017-12-28 MED ORDER — PROPOFOL 1000 MG/100ML IV EMUL
5.0000 ug/kg/min | INTRAVENOUS | Status: DC
  Administered 2017-12-29: 5 ug/kg/min via INTRAVENOUS
  Administered 2017-12-29: 35 ug/kg/min via INTRAVENOUS
  Administered 2017-12-29: 15 ug/kg/min via INTRAVENOUS
  Administered 2017-12-29: 40 ug/kg/min via INTRAVENOUS
  Administered 2017-12-30: 30 ug/kg/min via INTRAVENOUS
  Administered 2017-12-30: 40 ug/kg/min via INTRAVENOUS
  Administered 2017-12-30: 30 ug/kg/min via INTRAVENOUS
  Administered 2017-12-30: 25 ug/kg/min via INTRAVENOUS
  Administered 2017-12-31: 30 ug/kg/min via INTRAVENOUS
  Filled 2017-12-28 (×8): qty 100

## 2017-12-28 MED ORDER — SODIUM CHLORIDE 0.9% FLUSH: 3.0000 mL | INTRAVENOUS | Status: DC | PRN

## 2017-12-28 MED ORDER — AMIODARONE LOAD VIA INFUSION
150.0000 mg | Freq: Once | INTRAVENOUS | Status: DC
  Filled 2017-12-28: qty 83.34

## 2017-12-28 MED ORDER — POTASSIUM CHLORIDE CRYS ER 20 MEQ PO TBCR: 20.0000 meq | EXTENDED_RELEASE_TABLET | Freq: Every day | ORAL | Status: DC

## 2017-12-28 MED ORDER — DIGOXIN 0.25 MG/ML IJ SOLN
0.5000 mg | Freq: Once | INTRAMUSCULAR | Status: AC
  Administered 2017-12-28: 0.5 mg via INTRAVENOUS
  Filled 2017-12-28: qty 2

## 2017-12-28 MED ORDER — SODIUM CHLORIDE 0.9% FLUSH
3.0000 mL | Freq: Two times a day (BID) | INTRAVENOUS | Status: DC
  Administered 2017-12-28 – 2017-12-31 (×6): 3 mL via INTRAVENOUS
  Administered 2018-01-02: 10 mL via INTRAVENOUS
  Administered 2018-01-03 (×2): 3 mL via INTRAVENOUS
  Administered 2018-01-04: 10 mL via INTRAVENOUS

## 2017-12-28 MED ORDER — ACETAMINOPHEN 325 MG PO TABS: 650.0000 mg | ORAL_TABLET | ORAL | Status: DC | PRN

## 2017-12-28 MED ORDER — FUROSEMIDE 10 MG/ML IJ SOLN
40.0000 mg | Freq: Once | INTRAMUSCULAR | Status: AC
  Administered 2017-12-28: 40 mg via INTRAVENOUS
  Filled 2017-12-28: qty 4

## 2017-12-28 MED ORDER — POTASSIUM CHLORIDE CRYS ER 20 MEQ PO TBCR: 30.0000 meq | EXTENDED_RELEASE_TABLET | Freq: Every day | ORAL | Status: DC

## 2017-12-28 MED ORDER — ACETAMINOPHEN 325 MG PO TABS: 650.0000 mg | ORAL_TABLET | Freq: Four times a day (QID) | ORAL | Status: DC | PRN

## 2017-12-28 MED ORDER — ASPIRIN EC 81 MG PO TBEC: 81.0000 mg | DELAYED_RELEASE_TABLET | Freq: Every day | ORAL | Status: DC

## 2017-12-28 MED ORDER — SODIUM CHLORIDE 0.9 % IV SOLN
250.0000 mL | INTRAVENOUS | Status: DC | PRN
  Administered 2017-12-30 – 2018-01-03 (×3): 250 mL via INTRAVENOUS

## 2017-12-28 MED ORDER — ASPIRIN 81 MG PO CHEW
324.0000 mg | CHEWABLE_TABLET | Freq: Once | ORAL | Status: AC
  Administered 2017-12-28: 324 mg via ORAL
  Filled 2017-12-28: qty 4

## 2017-12-28 MED ORDER — ATORVASTATIN CALCIUM 40 MG PO TABS
40.0000 mg | ORAL_TABLET | Freq: Every day | ORAL | Status: DC
  Administered 2017-12-28: 40 mg via ORAL
  Filled 2017-12-28: qty 1

## 2017-12-28 MED ORDER — METOPROLOL TARTRATE 5 MG/5ML IV SOLN
INTRAVENOUS | Status: AC
  Administered 2017-12-28: 2.5 mg
  Filled 2017-12-28: qty 5

## 2017-12-28 MED ORDER — ONDANSETRON HCL 4 MG/2ML IJ SOLN: 4.0000 mg | Freq: Four times a day (QID) | INTRAMUSCULAR | Status: DC | PRN

## 2017-12-28 MED ORDER — PANTOPRAZOLE SODIUM 40 MG PO TBEC
40.0000 mg | DELAYED_RELEASE_TABLET | Freq: Every day | ORAL | Status: DC
  Administered 2017-12-28: 40 mg via ORAL
  Filled 2017-12-28: qty 1

## 2017-12-28 MED ORDER — INSULIN ASPART 100 UNIT/ML ~~LOC~~ SOLN: 0.0000 [IU] | Freq: Every day | SUBCUTANEOUS | Status: DC

## 2017-12-28 MED ORDER — METOPROLOL TARTRATE 25 MG PO TABS: 12.5000 mg | ORAL_TABLET | Freq: Two times a day (BID) | ORAL | Status: DC

## 2017-12-28 MED ORDER — FUROSEMIDE 10 MG/ML IJ SOLN
40.0000 mg | Freq: Two times a day (BID) | INTRAMUSCULAR | Status: DC
  Administered 2017-12-28 – 2017-12-30 (×4): 40 mg via INTRAVENOUS
  Filled 2017-12-28 (×4): qty 4

## 2017-12-28 MED ORDER — AMIODARONE HCL IN DEXTROSE 360-4.14 MG/200ML-% IV SOLN
60.0000 mg/h | INTRAVENOUS | Status: AC
  Administered 2017-12-29: 60 mg/h via INTRAVENOUS
  Filled 2017-12-28: qty 200

## 2017-12-28 MED ORDER — AMIODARONE HCL 200 MG PO TABS
200.0000 mg | ORAL_TABLET | Freq: Every day | ORAL | Status: DC
  Administered 2017-12-28: 200 mg via ORAL
  Filled 2017-12-28: qty 1

## 2017-12-28 NOTE — ED Notes (Signed)
Emptied urinal of 375 ml urine.

## 2017-12-28 NOTE — ED Triage Notes (Signed)
Patient complaining of shortness of breath x 1 week. Denies chest pain.

## 2017-12-28 NOTE — Progress Notes (Addendum)
Pts HR increasing to 150-170's, pt having labored breathing. He is following commands but squeezed with his R hand and not his L. Pt is having some facial droop. Pt placed on oxygen, EKG is in process and Dr. Olevia Bowens paged to come to floor.  EKG states AFIB with RVR\ Dr. Olevia Bowens in room with patient- pt is having periods of apnea.  Pupils round and reactive, L sided weakness and L sided facial droop. Pt is answering some questions and continues to have slurred speech. HR still increased. Metoprolol 2.5 IV and digoxin 0.5IV given per MD order.  Dr. Dina Rich paged and came up to room- Atomidate given @ 2330, ROC given @ 2331. Pt was intubated with 7.5 ETT for protection of airway @ 2334. OG/NG tubed placed- CXR confirms its in stomach.    Foley catheter placed per MD order.
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
## 2017-12-28 NOTE — ED Notes (Signed)
CRITICAL VALUE ALERT  Critical Value:  Troponin - 0.08  Date & Time Notied:  12/28/17    1002  Provider Notified: Evalee Jefferson PA  Orders Received/Actions taken:

## 2017-12-29 ENCOUNTER — Inpatient Hospital Stay (HOSPITAL_COMMUNITY): Payer: BLUE CROSS/BLUE SHIELD

## 2017-12-29 ENCOUNTER — Encounter (HOSPITAL_COMMUNITY): Payer: Self-pay | Admitting: Cardiology

## 2017-12-29 DIAGNOSIS — J9600 Acute respiratory failure, unspecified whether with hypoxia or hypercapnia: Secondary | ICD-10-CM | POA: Diagnosis present

## 2017-12-29 DIAGNOSIS — N1832 Chronic kidney disease, stage 3b: Secondary | ICD-10-CM

## 2017-12-29 DIAGNOSIS — N183 Chronic kidney disease, stage 3 unspecified: Secondary | ICD-10-CM

## 2017-12-29 DIAGNOSIS — I340 Nonrheumatic mitral (valve) insufficiency: Secondary | ICD-10-CM

## 2017-12-29 DIAGNOSIS — I361 Nonrheumatic tricuspid (valve) insufficiency: Secondary | ICD-10-CM

## 2017-12-29 DIAGNOSIS — I959 Hypotension, unspecified: Secondary | ICD-10-CM

## 2017-12-29 LAB — BLOOD GAS, ARTERIAL
ACID-BASE DEFICIT: 3.6 mmol/L — AB (ref 0.0–2.0)
Acid-base deficit: 6 mmol/L — ABNORMAL HIGH (ref 0.0–2.0)
Acid-base deficit: 0.3 mmol/L (ref 0.0–2.0)
Bicarbonate: 18.7 mmol/L — ABNORMAL LOW (ref 20.0–28.0)
Bicarbonate: 20.2 mmol/L (ref 20.0–28.0)
DRAWN BY: 419771
Drawn by: 419771
Drawn by: 234301
FIO2: 100
FIO2: 100
FIO2: 100
MECHVT: 540 mL
MECHVT: 540 mL
MECHVT: 540 mL
O2 Content: 243 L/min
O2 Saturation: 94 %
O2 Saturation: 90.9 %
PEEP: 5 cmH2O
PEEP: 5 cmH2O
PEEP: 5 cmH2O
PH ART: 7.409 (ref 7.350–7.450)
Patient temperature: 97.3
Patient temperature: 37
RATE: 16 resp/min
RATE: 18 resp/min
RATE: 20 resp/min
pCO2 arterial: 47.6 mmHg (ref 32.0–48.0)
pCO2 arterial: 52.4 mmHg — ABNORMAL HIGH (ref 32.0–48.0)
pCO2 arterial: 38.4 mmHg (ref 32.0–48.0)
pH, Arterial: 7.247 — ABNORMAL LOW (ref 7.350–7.450)
pH, Arterial: 7.255 — ABNORMAL LOW (ref 7.350–7.450)
pO2, Arterial: 88.1 mmHg (ref 83.0–108.0)
pO2, Arterial: 73.7 mmHg — ABNORMAL LOW (ref 83.0–108.0)
pO2, Arterial: 243 mmHg — ABNORMAL HIGH (ref 83.0–108.0)

## 2017-12-29 LAB — GLUCOSE, CAPILLARY
GLUCOSE-CAPILLARY: 131 mg/dL — AB (ref 70–99)
Glucose-Capillary: 112 mg/dL — ABNORMAL HIGH (ref 70–99)
Glucose-Capillary: 119 mg/dL — ABNORMAL HIGH (ref 70–99)
Glucose-Capillary: 169 mg/dL — ABNORMAL HIGH (ref 70–99)
Glucose-Capillary: 203 mg/dL — ABNORMAL HIGH (ref 70–99)
Glucose-Capillary: 94 mg/dL (ref 70–99)

## 2017-12-29 LAB — CBC
HCT: 50.4 % (ref 39.0–52.0)
Hemoglobin: 15.6 g/dL (ref 13.0–17.0)
MCH: 26.7 pg (ref 26.0–34.0)
MCHC: 31 g/dL (ref 30.0–36.0)
MCV: 86.3 fL (ref 80.0–100.0)
PLATELETS: 137 10*3/uL — AB (ref 150–400)
RBC: 5.84 MIL/uL — ABNORMAL HIGH (ref 4.22–5.81)
RDW: 18.5 % — ABNORMAL HIGH (ref 11.5–15.5)
WBC: 8.3 10*3/uL (ref 4.0–10.5)
nRBC: 0 % (ref 0.0–0.2)

## 2017-12-29 LAB — HEPATIC FUNCTION PANEL
ALT: 40 U/L (ref 0–44)
AST: 94 U/L — ABNORMAL HIGH (ref 15–41)
Albumin: 3.3 g/dL — ABNORMAL LOW (ref 3.5–5.0)
Alkaline Phosphatase: 197 U/L — ABNORMAL HIGH (ref 38–126)
BILIRUBIN INDIRECT: 1.5 mg/dL — AB (ref 0.3–0.9)
Bilirubin, Direct: 0.8 mg/dL — ABNORMAL HIGH (ref 0.0–0.2)
Total Bilirubin: 2.3 mg/dL — ABNORMAL HIGH (ref 0.3–1.2)
Total Protein: 6.8 g/dL (ref 6.5–8.1)

## 2017-12-29 LAB — APTT: aPTT: 40 seconds — ABNORMAL HIGH (ref 24–36)

## 2017-12-29 LAB — TROPONIN I
Troponin I: 0.08 ng/mL (ref <0.03)
Troponin I: 0.09 ng/mL (ref <0.03)

## 2017-12-29 LAB — BASIC METABOLIC PANEL
Anion gap: 11 (ref 5–15)
BUN: 46 mg/dL — ABNORMAL HIGH (ref 8–23)
CO2: 25 mmol/L (ref 22–32)
Calcium: 8.2 mg/dL — ABNORMAL LOW (ref 8.9–10.3)
Chloride: 103 mmol/L (ref 98–111)
Creatinine, Ser: 2.57 mg/dL — ABNORMAL HIGH (ref 0.61–1.24)
GFR calc Af Amer: 28 mL/min — ABNORMAL LOW (ref >60)
GFR calc non Af Amer: 24 mL/min — ABNORMAL LOW (ref >60)
GLUCOSE: 195 mg/dL — AB (ref 70–99)
Potassium: 4.2 mmol/L (ref 3.5–5.1)
Sodium: 139 mmol/L (ref 135–145)

## 2017-12-29 LAB — TRIGLYCERIDES: Triglycerides: 93 mg/dL (ref <150)

## 2017-12-29 LAB — MRSA PCR SCREENING: MRSA by PCR: NEGATIVE

## 2017-12-29 LAB — ECHOCARDIOGRAM COMPLETE
Height: 68 in
Weight: 3280 oz

## 2017-12-29 LAB — HEPARIN LEVEL (UNFRACTIONATED): Heparin Unfractionated: 1.32 IU/mL — ABNORMAL HIGH (ref 0.30–0.70)

## 2017-12-29 LAB — HIV ANTIBODY (ROUTINE TESTING W REFLEX): HIV Screen 4th Generation wRfx: NONREACTIVE

## 2017-12-29 MED ORDER — POTASSIUM CHLORIDE 20 MEQ/15ML (10%) PO SOLN
20.0000 meq | Freq: Every day | ORAL | Status: DC
  Administered 2017-12-29 – 2017-12-31 (×3): 20 meq
  Filled 2017-12-29 (×3): qty 30

## 2017-12-29 MED ORDER — METHYLPREDNISOLONE SODIUM SUCC 125 MG IJ SOLR
125.0000 mg | Freq: Once | INTRAMUSCULAR | Status: AC
  Administered 2017-12-29: 125 mg via INTRAVENOUS
  Filled 2017-12-29: qty 2

## 2017-12-29 MED ORDER — PROPOFOL 1000 MG/100ML IV EMUL: 0.0000 ug/kg/min | INTRAVENOUS | Status: DC

## 2017-12-29 MED ORDER — INSULIN ASPART 100 UNIT/ML ~~LOC~~ SOLN
0.0000 [IU] | SUBCUTANEOUS | Status: DC
  Administered 2017-12-29: 3 [IU] via SUBCUTANEOUS
  Administered 2017-12-29 – 2017-12-30 (×4): 1 [IU] via SUBCUTANEOUS
  Administered 2017-12-30 (×4): 2 [IU] via SUBCUTANEOUS
  Administered 2017-12-31 (×2): 1 [IU] via SUBCUTANEOUS

## 2017-12-29 MED ORDER — IPRATROPIUM BROMIDE 0.02 % IN SOLN
0.5000 mg | Freq: Four times a day (QID) | RESPIRATORY_TRACT | Status: DC
  Administered 2017-12-29 – 2018-01-01 (×14): 0.5 mg via RESPIRATORY_TRACT
  Filled 2017-12-29 (×14): qty 2.5

## 2017-12-29 MED ORDER — CHLORHEXIDINE GLUCONATE CLOTH 2 % EX PADS
6.0000 | MEDICATED_PAD | Freq: Every day | CUTANEOUS | Status: DC
  Administered 2017-12-29 – 2018-01-01 (×4): 6 via TOPICAL

## 2017-12-29 MED ORDER — LEVALBUTEROL HCL 1.25 MG/0.5ML IN NEBU: 1.2500 mg | INHALATION_SOLUTION | Freq: Four times a day (QID) | RESPIRATORY_TRACT | Status: DC

## 2017-12-29 MED ORDER — FENTANYL CITRATE (PF) 100 MCG/2ML IJ SOLN: 50.0000 ug | INTRAMUSCULAR | Status: DC | PRN

## 2017-12-29 MED ORDER — CHLORHEXIDINE GLUCONATE 0.12% ORAL RINSE (MEDLINE KIT): 15.0000 mL | Freq: Two times a day (BID) | OROMUCOSAL | Status: DC

## 2017-12-29 MED ORDER — FUROSEMIDE 10 MG/ML IJ SOLN: 40.0000 mg | Freq: Once | INTRAMUSCULAR | Status: DC

## 2017-12-29 MED ORDER — ASPIRIN 81 MG PO CHEW
81.0000 mg | CHEWABLE_TABLET | Freq: Every day | ORAL | Status: DC
  Administered 2017-12-29 – 2018-01-03 (×6): 81 mg
  Filled 2017-12-29 (×6): qty 1

## 2017-12-29 MED ORDER — PANTOPRAZOLE SODIUM 40 MG IV SOLR
40.0000 mg | INTRAVENOUS | Status: DC
  Administered 2017-12-29 – 2018-01-02 (×5): 40 mg via INTRAVENOUS
  Filled 2017-12-29 (×5): qty 40

## 2017-12-29 MED ORDER — SODIUM CHLORIDE 0.9% FLUSH: 10.0000 mL | INTRAVENOUS | Status: DC | PRN

## 2017-12-29 MED ORDER — CHLORHEXIDINE GLUCONATE 0.12% ORAL RINSE (MEDLINE KIT)
15.0000 mL | Freq: Two times a day (BID) | OROMUCOSAL | Status: DC
  Administered 2017-12-29 – 2017-12-31 (×6): 15 mL via OROMUCOSAL

## 2017-12-29 MED ORDER — SODIUM CHLORIDE 0.9 % IV SOLN
3.0000 g | Freq: Three times a day (TID) | INTRAVENOUS | Status: AC
  Administered 2017-12-29 – 2018-01-02 (×13): 3 g via INTRAVENOUS
  Filled 2017-12-29 (×18): qty 3

## 2017-12-29 MED ORDER — LEVALBUTEROL HCL 0.63 MG/3ML IN NEBU
0.6300 mg | INHALATION_SOLUTION | Freq: Four times a day (QID) | RESPIRATORY_TRACT | Status: DC | PRN
  Administered 2017-12-29: 0.63 mg via RESPIRATORY_TRACT
  Filled 2017-12-29: qty 3

## 2017-12-29 MED ORDER — LEVALBUTEROL HCL 0.63 MG/3ML IN NEBU
0.6300 mg | INHALATION_SOLUTION | RESPIRATORY_TRACT | Status: DC | PRN
  Administered 2017-12-29: 0.63 mg via RESPIRATORY_TRACT
  Filled 2017-12-29: qty 3

## 2017-12-29 MED ORDER — MORPHINE SULFATE (PF) 2 MG/ML IV SOLN: 1.0000 mg | INTRAVENOUS | Status: DC | PRN

## 2017-12-29 MED ORDER — IPRATROPIUM BROMIDE 0.02 % IN SOLN: 0.5000 mg | Freq: Four times a day (QID) | RESPIRATORY_TRACT | Status: DC

## 2017-12-29 MED ORDER — ORAL CARE MOUTH RINSE
15.0000 mL | OROMUCOSAL | Status: DC
  Administered 2017-12-29 – 2018-01-01 (×28): 15 mL via OROMUCOSAL

## 2017-12-29 MED ORDER — LEVALBUTEROL HCL 1.25 MG/0.5ML IN NEBU
1.2500 mg | INHALATION_SOLUTION | Freq: Four times a day (QID) | RESPIRATORY_TRACT | Status: DC
  Administered 2017-12-29: 1.25 mg via RESPIRATORY_TRACT
  Filled 2017-12-29: qty 0.5

## 2017-12-29 MED ORDER — PHENYLEPHRINE HCL-NACL 10-0.9 MG/250ML-% IV SOLN
0.0000 ug/min | INTRAVENOUS | Status: DC
  Administered 2017-12-29 (×2): 20 ug/min via INTRAVENOUS
  Administered 2017-12-30 (×2): 50 ug/min via INTRAVENOUS
  Filled 2017-12-29 (×4): qty 250

## 2017-12-29 MED ORDER — SODIUM CHLORIDE 0.9% FLUSH
10.0000 mL | Freq: Two times a day (BID) | INTRAVENOUS | Status: DC
  Administered 2017-12-29 – 2017-12-30 (×3): 10 mL
  Administered 2017-12-31: 20 mL
  Administered 2017-12-31 – 2018-01-01 (×2): 10 mL

## 2017-12-29 MED ORDER — ACETAMINOPHEN 325 MG PO TABS
650.0000 mg | ORAL_TABLET | ORAL | Status: DC | PRN
  Administered 2018-01-03: 650 mg
  Filled 2017-12-29: qty 2

## 2017-12-29 MED ORDER — ORAL CARE MOUTH RINSE: 15.0000 mL | OROMUCOSAL | Status: DC

## 2017-12-29 MED ORDER — HEPARIN (PORCINE) 25000 UT/250ML-% IV SOLN
900.0000 [IU]/h | INTRAVENOUS | Status: DC
  Administered 2017-12-29: 1050 [IU]/h via INTRAVENOUS
  Administered 2017-12-30 – 2017-12-31 (×2): 900 [IU]/h via INTRAVENOUS
  Filled 2017-12-29 (×3): qty 250

## 2017-12-29 MED ORDER — FENTANYL CITRATE (PF) 100 MCG/2ML IJ SOLN
50.0000 ug | INTRAMUSCULAR | Status: DC | PRN
  Administered 2017-12-29 – 2018-01-02 (×3): 50 ug via INTRAVENOUS
  Filled 2017-12-29 (×3): qty 2

## 2017-12-29 MED ORDER — INSULIN ASPART 100 UNIT/ML ~~LOC~~ SOLN: 0.0000 [IU] | Freq: Three times a day (TID) | SUBCUTANEOUS | Status: DC

## 2017-12-29 MED ORDER — ATORVASTATIN CALCIUM 40 MG PO TABS
40.0000 mg | ORAL_TABLET | Freq: Every day | ORAL | Status: DC
  Administered 2017-12-29 – 2018-01-03 (×6): 40 mg
  Filled 2017-12-29 (×6): qty 1

## 2017-12-29 MED ORDER — SODIUM CHLORIDE 0.9 % IV SOLN
3.0000 g | Freq: Four times a day (QID) | INTRAVENOUS | Status: DC
  Administered 2017-12-29: 3 g via INTRAVENOUS
  Filled 2017-12-29 (×8): qty 3

## 2017-12-29 NOTE — Progress Notes (Signed)
*  PRELIMINARY RESULTS* Echocardiogram 2D Echocardiogram has been performed.  Phillip Wilson 12/29/2017, 4:24 PM

## 2017-12-29 NOTE — Progress Notes (Signed)
Addendum:  The patient's ABG worsened despite being at 100% FIO2 and a rate of 18 BPM. He was started on Unasyn earlier for possible aspiration pneumonia. Will give solumedrol 125 mg IVP x1 dose and start on Xopenex+Atrovent nebs. Given severe condition in the setting of Class IV SCHF with possible aspiration pneumonia and new neurological changes, his prognosis is poor.   Tennis Must, MD

## 2017-12-29 NOTE — Progress Notes (Signed)
Pharmacy Antibiotic Note  Phillip Wilson is a 72 y.o. male admitted on 12/28/2017 with aspiration  pneumonia.  Pharmacy has been consulted for unasyn dosing.  Plan: Unasyn 3gm IV q8h F/U cxs and clinical progress Monitor V/S, labs  Height: 5\' 8"  (172.7 cm) Weight: 205 lb (93 kg) IBW/kg (Calculated) : 68.4  Temp (24hrs), Avg:97.9 F (36.6 C), Min:97.3 F (36.3 C), Max:98.5 F (36.9 C)  Recent Labs  Lab 12/22/17 1209 12/28/17 0906 12/29/17 0514  WBC  --  6.9 8.3  CREATININE 2.54* 2.18* 2.57*    Estimated Creatinine Clearance: 29.6 mL/min (A) (by C-G formula based on SCr of 2.57 mg/dL (H)).    Allergies  Allergen Reactions  . Entresto [Sacubitril-Valsartan]     BLE Edema/ Blisters     Antimicrobials this admission: Unasyn 12/3 >>   Dose adjustments this admission: n/a  Microbiology results:  BCx:  12/2 MRSA PCR: negative  Thank you for allowing pharmacy to be a part of this patient's care.  Isac Sarna, BS Pharm D, [place] Clinical Pharmacist Pager [phone] 12/29/2017 11:06 AM

## 2017-12-29 NOTE — Progress Notes (Signed)
Dr. Olevia Bowens made aware of no urine output since foley catheter placement- Lasix just given per MD order. Also notified about sig amt of blood in subglottic. Will continue to monitor pt
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
## 2017-12-29 NOTE — Care Management (Signed)
CM consult noted for CHF. CM will follow and address needs when appropriate.

## 2017-12-29 NOTE — ED Provider Notes (Signed)
I was called emergently to the ICU for intubation.  Patient found to be in respiratory distress.  In atrial fibrillation with RVR and developed acute strokelike symptoms.  Hospitalist is the primary team.  He is requesting intubation.  O2 sats 100% prior to intubation.  Patient chart reviewed.  He is full code.  He has some acute kidney injury.  He is drowsy but arousable and responsive.  He understands that he will be placed on a breathing tube.  Physical Exam  BP (!) 80/67 (BP Location: Left Arm)   Pulse (!) 106   Temp 98.5 F (36.9 C)   Resp 10   Ht 1.727 m (5\' 8" )   Wt 93 kg   SpO2 100%   BMI 31.17 kg/m   Physical Exam  Critically ill-appearing patient Increased work of breathing with shallow respirations and rails Tachycardia with a regular rate Diffuse lower extremity edema  ED Course/Procedures     Procedure Name: Intubation Date/Time: 12/29/2017 12:09 AM Performed by: Merryl Hacker, MD Pre-anesthesia Checklist: Patient identified, Emergency Drugs available and Patient being monitored Oxygen Delivery Method: Non-rebreather mask Induction Type: Rapid sequence Laryngoscope Size: Glidescope and 4 Grade View: Grade I Tube size: 7.5 mm Number of attempts: 1 Placement Confirmation: ETT inserted through vocal cords under direct vision,  Positive ETCO2 and Breath sounds checked- equal and bilateral Secured at: 24 cm Tube secured with: ETT holder Dental Injury: Teeth and Oropharynx as per pre-operative assessment        MDM   Patient evaluated.  Found critically ill and in respiratory distress.  Patient intubated without difficulty.  X-ray reviewed and shows good placement of ET tube.  Full care handed back over to the hospitalist, Olevia Bowens.       Merryl Hacker, MD 12/29/17 0010
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
## 2017-12-29 NOTE — Progress Notes (Signed)
RN spoke with pts daughter Dirk Dress and adv that pt is intubated d/t protection of his airway. Pts daughter stated she would be here in the am.

## 2017-12-29 NOTE — Progress Notes (Signed)
Pts BP reading high with automatic cuff- Bp cuff has been switched several times as well as patients position. Manual BP reading between 84 & 90. Will continue manual BP q30 min as needed
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
## 2017-12-29 NOTE — Progress Notes (Signed)
Dr. Olevia Bowens paged and made aware Unasyn is unable to be hung d/t limited IV access. Propofol and Amiodarone are incompatible with one another and Unasyn. 3 RN's have tried a 3rd IV access without success. Waiting for orders/call back. Will continue to monitor pt

## 2017-12-29 NOTE — Procedures (Signed)
Procedure Note  12/29/17    Preoperative Diagnosis: Hypotension, Chronic kidney disease stage III, Chronic systolic heart failure, A fib with rapid ventricular rate    Postoperative Diagnosis: Same   Procedure(s) Performed: Central Line placement, left femoral    Surgeon: Lanell Matar. Constance Haw, MD   Assistants: None   Anesthesia: 1% lidocaine    Complications: None    Indications: Phillip Wilson is a 72 y.o. with hypotension related to heart failure, A fib with RVR and CKD Stage III who came to the hospital with volume overload with pulmonary edema and possible aspiration pneumonia. He has been intubated and needs treatment for his volume overload and heart rate control, and is starting to develop hypotension which will likely require pressors given his volume status . I discussed the risk and benefits of placement of the central line with the daughter, Phillip Wilson, including but not limited to bleeding, infection, and risk of injury to surround structures. His risk of bleeding is also higher due to being on Savaysa which we presume he took yesterday.  She understood these risk and the reasoning for attempting a femoral line under ultrasound guidance and has given verbal consent for the procedure.    Procedure: The patient placed supine. The left groin was assessed with the ultrasound demonstrating a good sized and patent femoral vein.  The left groin was prepped and draped in the usual sterile fashion.  Wearing full gown and gloves, I performed the procedure.  One percent lidocaine was used for local anesthesia. An ultrasound was utilized to assess the femoral vein.  The needle with syringe was advanced into the vein with dark venous return, and a wire was placed using the Seldinger technique without difficulty. The ultrasound was used to ensure the wire passed into the vein and continued intraluminal. The skin was knicked and a dilator was placed, and the three lumen catheter was placed over the wire  with continued control of the wire.  There was good draw back of blood from all three lumens and each flushed easily with saline.  The catheter was secured in 4 points with 2-0 silk and a biopatch and dressing was placed.     The patient tolerated the procedure well. The line is able to be used immediately.  When the line is pulled the patient's heparin gtt should be held for 6 hours prior to removing and 10 minutes of pressure should be held continuously at the site.   Curlene Labrum, MD [facility] [address] [place], [place] [address] [phone] (office)

## 2017-12-29 NOTE — Progress Notes (Signed)
Patient transported from ICU 11 to CT and back without any complications.

## 2017-12-29 NOTE — Progress Notes (Signed)
Night shift stepdown coverage note.  The patient was seen due to transient left-sided weakness, A. fib with RVR and respiratory distress.  The chart was reviewed.  He was admitted for acute systolic CHF and A. fib with RVR due to noncompliance with his medications.  He was unable to verbalize much, but was able to follow most commands.  He was initially 1 out of 5 on his left side strength, but subsequently recovered and was 4/5 on his handgrip.  However, the patient continued to be tachycardic, became dyspneic with his respiratory pattern alternating from the single digits to more than 48-minute.  He had at least 3 cycles of this respiratory pattern.  He had received earlier metoprolol 2.5 mg IVP x2.  Metoprolol 2.5 mg IVP was given without significant results.  He had to be subsequently intubated by Dr. Dina Rich and put on mechanical ventilation due to impending respiratory failure.  Amiodarone 150 mg loading dose, followed by amiodarone infusion plus digoxin 500 mcg IVP x1 was given.  The Case was discussed with Dr. Pulcifer Lions, who is on-call for cardiology.  He recommended to avoid using beta-blockers or calcium channel blockers given the patient's low EF.  He agreed with continuing amiodarone and digoxin.  His vital signs abdominal movement were temperature 98.5 F, pulse 154, respirations 27, blood pressure 87/65 mmHg and O2 sat 89% on room air.  Manual blood pressure rechecked by myself was 140/84 mmHg prior to any medications being administered.  General: Anxious and tachypneic. HEENT: Normocephalic Neck: Supple, no JVD. Lungs: Mostly tachypneic, but with some periods of bradypnea.  Positive bibasilar rales. CV: S1-S2, tachycardic in the 140s and 150s, irregularly irregular, no murmurs. Abdomen: soft nontender. Extremities: 2+ lower extremity edema. Neuro: Transient left facial droop and left-sided weakness.  Back to baseline now.  PT 28.8, INR 1.82 and PTT was 41.  Troponin was 0.08 ng/mL.   ABG showed a pH of 7.24, PCO2 and PO2 were normal.  Bicarbonate was 18.7 and acid base deficit of 6.0 mmol/L.  CT head without contrast code stroke was negative. Chest radiograph showed cardiomegaly with vascular congestion.  CT chest w/out contrast was done to rule out aspiration pneumonia, but it was inconclusive.  Assessment: Acute respiratory failure in the presence of severe heart failure and atrial fibrillation with RVR. Continue mechanical ventilation.  Continue sedation.I would like to thank Dr. Dina Rich for her help and efforts. Continue amiodarone infusion.  Consult cardiology for valuation and further treatment.  Consult pulmonology for mechanical ventilation management.  Tennis Must, MD  About 65 minutes of critical care time were spent during this emergent event.  This document was prepared using Dragon voice recognition software and may contain some unintended transcription errors.

## 2017-12-29 NOTE — Progress Notes (Signed)
[facility]  Femoral line in the left without complication under ultrasound guidance. Ok to use line. When the line is pulled the patient's heparin gtt should be held for 6 hours prior to removing and 10 minutes of pressure should be held continuously at the site after removal.  Please notify me with question or concerns.  Curlene Labrum, MD [facility] [address] [place], [place] [address] [phone] (office)

## 2017-12-29 NOTE — Progress Notes (Signed)
Dr. Dyann Kief made aware of patients blood pressures throughout the shift, titration of IV medications and of there noted to be some bleeding with clotting at the insertions site of the femoral line. Pressure applied for 15 minutes and dressing changed. No noted bleeding currently. Reported to night nurse to monitor site per Dr. Dyann Kief and he stated to keep heparin gtt going at current rate.

## 2017-12-29 NOTE — Progress Notes (Signed)
Initial Nutrition Assessment  DOCUMENTATION CODES:   Obesity unspecified  INTERVENTION:  If patient unable to wean 24-48 hr and tube feeding initiated recommend: Vital High Protein @ 60 ml/hr via OGT.   Propofol adds: 147 kcal lipids q 24 hr at current rate.  Tube feeding + sedative provides 1587 kcal, 126 grams of protein, and 1204 ml of H2O.  Meets 100% of estimated energy and protein needs.   NUTRITION DIAGNOSIS:  Inadequate oral intake related to inability to eat, respiratory failure as evidenced by intubation, NPO status.   GOAL:  Provide needs based on ASPEN/SCCM guidelines  MONITOR:   Vent status, Labs, I & O's, Weight trends, TF tolerance  REASON FOR ASSESSMENT:   Ventilator    ASSESSMENT:  Patient is a 72 yo male currently intubated (12/2 @ 2335) on ventilator support. He has a hx of DM-2, CHF, Cardiomyopathy (LVEF-15-20%), GERD, HTN and CKD. Patient presents with acute hypoxic/hypercapnic respiratory failure, acute on chronic CHF. Deep pitting edema to bilateral feet. Discussed pt with RN.   MV: 10.7  L/min Temp (24hrs), Avg:97.9 F (36.6 C), Min:97.3 F (36.3 C), Max:98.5 F (36.9 C)  Propofol: 5.58 ml/hr (provides-147 kcal lipids q 24 hr).   Medications reviewed and include: lipitor, lasix, SSI, Protonix, KCL,   Labs: BMP Latest Ref Rng & Units 12/29/2017 12/28/2017 12/22/2017  Glucose 70 - 99 mg/dL 195(H) 200(H) 105(H)  BUN 8 - 23 mg/dL 46(H) 48(H) 48(H)  Creatinine 0.61 - 1.24 mg/dL 2.57(H) 2.18(H) 2.54(H)  BUN/Creat Ratio 6 - 22 (calc) - - 19  Sodium 135 - 145 mmol/L 139 136 138  Potassium 3.5 - 5.1 mmol/L 4.2 3.8 4.2  Chloride 98 - 111 mmol/L 103 102 101  CO2 22 - 32 mmol/L 25 23 24   Calcium 8.9 - 10.3 mg/dL 8.2(L) 8.3(L) 8.8     NUTRITION - FOCUSED PHYSICAL EXAM: pending   Diet Order:   Diet Order            Diet NPO time specified  Diet effective now              EDUCATION NEEDS:   No education needs have been identified at this  time Skin:  Skin Assessment: Reviewed RN Assessment-pitting edema bilateral lower extremities  Last BM:  12/2   Height:   Ht Readings from Last 1 Encounters:  12/29/17 5\' 8"  (1.727 m)    Weight:   Wt Readings from Last 1 Encounters:  12/28/17 93 kg    Ideal Body Weight:  70 kg  BMI:  Body mass index is 31.17 kg/m.  Estimated Nutritional Needs:   Kcal:  1571 (PSU-)  Protein:  126-140 (1.8-2.0 gr/kg/ibw)  Fluid:  per MD goals   Colman Cater MS,RD,CSG,LDN Office: [phone] Pager: [phone]

## 2017-12-29 NOTE — Progress Notes (Signed)
No output after foley catheter placed- RN bladder scanned pt to make sure there wasn't any retention. 21mL Will continue to monitor

## 2017-12-30 ENCOUNTER — Ambulatory Visit: Payer: BLUE CROSS/BLUE SHIELD | Admitting: Family Medicine

## 2017-12-30 ENCOUNTER — Inpatient Hospital Stay (HOSPITAL_COMMUNITY): Payer: BLUE CROSS/BLUE SHIELD

## 2017-12-30 DIAGNOSIS — N179 Acute kidney failure, unspecified: Secondary | ICD-10-CM

## 2017-12-30 DIAGNOSIS — N183 Chronic kidney disease, stage 3 (moderate): Secondary | ICD-10-CM

## 2017-12-30 DIAGNOSIS — J9602 Acute respiratory failure with hypercapnia: Secondary | ICD-10-CM

## 2017-12-30 DIAGNOSIS — J9601 Acute respiratory failure with hypoxia: Secondary | ICD-10-CM

## 2017-12-30 LAB — APTT
aPTT: 189 seconds (ref 24–36)
aPTT: 178 seconds (ref 24–36)

## 2017-12-30 LAB — GLUCOSE, CAPILLARY
GLUCOSE-CAPILLARY: 160 mg/dL — AB (ref 70–99)
Glucose-Capillary: 135 mg/dL — ABNORMAL HIGH (ref 70–99)
Glucose-Capillary: 137 mg/dL — ABNORMAL HIGH (ref 70–99)
Glucose-Capillary: 148 mg/dL — ABNORMAL HIGH (ref 70–99)
Glucose-Capillary: 174 mg/dL — ABNORMAL HIGH (ref 70–99)
Glucose-Capillary: 182 mg/dL — ABNORMAL HIGH (ref 70–99)

## 2017-12-30 LAB — CBC
HCT: 43.7 % (ref 39.0–52.0)
Hemoglobin: 14.1 g/dL (ref 13.0–17.0)
MCH: 26.6 pg (ref 26.0–34.0)
MCHC: 32.3 g/dL (ref 30.0–36.0)
MCV: 82.3 fL (ref 80.0–100.0)
PLATELETS: 117 10*3/uL — AB (ref 150–400)
RBC: 5.31 MIL/uL (ref 4.22–5.81)
RDW: 16.7 % — AB (ref 11.5–15.5)
WBC: 8.6 10*3/uL (ref 4.0–10.5)
nRBC: 0 % (ref 0.0–0.2)

## 2017-12-30 LAB — BASIC METABOLIC PANEL
Anion gap: 11 (ref 5–15)
BUN: 44 mg/dL — ABNORMAL HIGH (ref 8–23)
CO2: 23 mmol/L (ref 22–32)
CREATININE: 2.47 mg/dL — AB (ref 0.61–1.24)
Calcium: 8.1 mg/dL — ABNORMAL LOW (ref 8.9–10.3)
Chloride: 106 mmol/L (ref 98–111)
GFR calc Af Amer: 29 mL/min — ABNORMAL LOW (ref >60)
GFR calc non Af Amer: 25 mL/min — ABNORMAL LOW (ref >60)
Glucose, Bld: 151 mg/dL — ABNORMAL HIGH (ref 70–99)
Potassium: 3.7 mmol/L (ref 3.5–5.1)
Sodium: 140 mmol/L (ref 135–145)

## 2017-12-30 LAB — HEPARIN LEVEL (UNFRACTIONATED)
Heparin Unfractionated: 0.94 IU/mL — ABNORMAL HIGH (ref 0.30–0.70)
Heparin Unfractionated: 0.61 IU/mL (ref 0.30–0.70)

## 2017-12-30 LAB — PHOSPHORUS
Phosphorus: 4.2 mg/dL (ref 2.5–4.6)
Phosphorus: 3.8 mg/dL (ref 2.5–4.6)

## 2017-12-30 LAB — MAGNESIUM
Magnesium: 1.9 mg/dL (ref 1.7–2.4)
Magnesium: 1.8 mg/dL (ref 1.7–2.4)

## 2017-12-30 MED ORDER — FUROSEMIDE 10 MG/ML IJ SOLN
40.0000 mg | Freq: Three times a day (TID) | INTRAMUSCULAR | Status: DC
  Administered 2017-12-30 – 2018-01-04 (×15): 40 mg via INTRAVENOUS
  Filled 2017-12-30 (×16): qty 4

## 2017-12-30 MED ORDER — NOREPINEPHRINE 4 MG/250ML-% IV SOLN
0.0000 ug/min | INTRAVENOUS | Status: DC
  Administered 2017-12-30: 6 ug/min via INTRAVENOUS
  Administered 2017-12-30: 2 ug/min via INTRAVENOUS
  Filled 2017-12-30 (×2): qty 250

## 2017-12-30 MED ORDER — PRO-STAT SUGAR FREE PO LIQD
30.0000 mL | Freq: Two times a day (BID) | ORAL | Status: DC
  Administered 2017-12-30: 30 mL
  Filled 2017-12-30: qty 30

## 2017-12-30 MED ORDER — VITAL HIGH PROTEIN PO LIQD
1000.0000 mL | ORAL | Status: DC
  Administered 2017-12-30: 1000 mL
  Filled 2017-12-30 (×3): qty 1000

## 2017-12-30 MED ORDER — VITAL HIGH PROTEIN PO LIQD
1000.0000 mL | ORAL | Status: DC
  Filled 2017-12-30: qty 1000

## 2017-12-30 MED ORDER — VITAL HIGH PROTEIN PO LIQD
1000.0000 mL | ORAL | Status: DC
  Filled 2017-12-30 (×2): qty 1000
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
## 2017-12-30 NOTE — Progress Notes (Signed)
CRITICAL VALUE ALERT  Critical Value:   APTT 189  Date & Time Notied:  12/30/17 @ 0200  [facility]  Orders Received/Actions taken: Decrease Heparin to 59ml/hr- per [facility] pharmacist

## 2017-12-30 NOTE — Progress Notes (Signed)
Follow up- Consult tube feeding adjustment/ management  DOCUMENTATION CODES:   Obesity unspecified  INTERVENTION:  Increase tube feeding rate -Vital High Protein @ 60 ml/hr via OGT.   Discontinue ProStat 30 ml BID   Propofol adds: 221 kcal lipids q 24 hr at current rate.  Tube feeding + sedative provides 1661 kcal, 126 grams of protein, and 1204 ml of H2O.  Meets 100% of estimated energy and protein needs.   NUTRITION DIAGNOSIS:  Inadequate oral intake related to inability to eat, respiratory failure as evidenced by intubation, NPO status.   GOAL:  Provide needs based on ASPEN/SCCM guidelines  MONITOR:   Vent status, Labs, I & O's, Weight trends, TF tolerance  REASON FOR ASSESSMENT:   Ventilator    ASSESSMENT:  Patient is a 72 yo Wilson currently intubated (12/2 @ 2335) on ventilator support. He has a hx of DM-2, CHF, Cardiomyopathy (LVEF-15-20%), GERD, HTN and CKD. Patient presents with acute hypoxic/hypercapnic respiratory failure, acute on chronic CHF. Deep pitting edema to bilateral feet. Discussed pt with RN.   Tube feeding started today. Rate adjusted to goal.  MV: 10.7  L/min Temp (24hrs), Avg:98.2 F (36.8 C), Min:97.7 F (36.5 C), Max:98.5 F (36.9 C)  Propofol: 8.37 ml/hr (provides-221 kcal lipids q 24 hr).   Medications reviewed and include: lipitor, lasix, SSI, Protonix, KCL,   Labs: BMP Latest Ref Rng & Units 12/30/2017 12/29/2017 12/28/2017  Glucose 70 - 99 mg/dL 151(H) 195(H) 200(H)  BUN 8 - 23 mg/dL 44(H) 46(H) 48(H)  Creatinine 0.61 - 1.24 mg/dL 2.47(H) 2.57(H) 2.18(H)  BUN/Creat Ratio 6 - 22 (calc) - - -  Sodium 135 - 145 mmol/L 140 139 136  Potassium 3.5 - 5.1 mmol/L 3.7 4.2 3.8  Chloride 98 - 111 mmol/L 106 103 102  CO2 22 - 32 mmol/L 23 25 23   Calcium 8.9 - 10.3 mg/dL 8.1(L) 8.2(L) 8.3(L)     NUTRITION - FOCUSED PHYSICAL EXAM: pending   Diet Order:   Diet Order            Diet NPO time specified  Diet effective now               EDUCATION NEEDS:   No education needs have been identified at this time Skin:  Skin Assessment: Reviewed RN Assessment-pitting edema bilateral lower extremities  Last BM:  12/2   Height:   Ht Readings from Last 1 Encounters:  12/29/17 5\' 8"  (1.727 m)    Weight:   Wt Readings from Last 1 Encounters:  12/28/17 93 kg    Ideal Body Weight:  70 kg  BMI:  Body mass index is 31.17 kg/m.  Estimated Nutritional Needs:   Kcal:  1571 (PSU-)  Protein:  126-140 (1.8-2.0 gr/kg/ibw)  Fluid:  per MD goals   Colman Cater MS,RD,CSG,LDN Office: [phone] Pager: [phone]
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
## 2017-12-31 ENCOUNTER — Inpatient Hospital Stay (HOSPITAL_COMMUNITY): Payer: BLUE CROSS/BLUE SHIELD

## 2017-12-31 LAB — GLUCOSE, CAPILLARY
GLUCOSE-CAPILLARY: 135 mg/dL — AB (ref 70–99)
Glucose-Capillary: 142 mg/dL — ABNORMAL HIGH (ref 70–99)
Glucose-Capillary: 106 mg/dL — ABNORMAL HIGH (ref 70–99)
Glucose-Capillary: 101 mg/dL — ABNORMAL HIGH (ref 70–99)
Glucose-Capillary: 121 mg/dL — ABNORMAL HIGH (ref 70–99)

## 2017-12-31 LAB — BLOOD GAS, ARTERIAL
Acid-Base Excess: 4.1 mmol/L — ABNORMAL HIGH (ref 0.0–2.0)
Acid-Base Excess: 4.2 mmol/L — ABNORMAL HIGH (ref 0.0–2.0)
Bicarbonate: 28.7 mmol/L — ABNORMAL HIGH (ref 20.0–28.0)
Bicarbonate: 28.4 mmol/L — ABNORMAL HIGH (ref 20.0–28.0)
DRAWN BY: 270161
Drawn by: 382351
FIO2: 40
FIO2: 40
MECHVT: 540 mL
MODE: POSITIVE
O2 SAT: 99.5 %
O2 Saturation: 99.4 %
PCO2 ART: 37.5 mmHg (ref 32.0–48.0)
PEEP: 5 cmH2O
PEEP: 5 cmH2O
PH ART: 7.511 — AB (ref 7.350–7.450)
Patient temperature: 37
Patient temperature: 37
Pressure support: 10 cmH2O
RATE: 20 resp/min
pCO2 arterial: 34.3 mmHg (ref 32.0–48.0)
pH, Arterial: 7.48 — ABNORMAL HIGH (ref 7.350–7.450)
pO2, Arterial: 138 mmHg — ABNORMAL HIGH (ref 83.0–108.0)
pO2, Arterial: 142 mmHg — ABNORMAL HIGH (ref 83.0–108.0)

## 2017-12-31 LAB — COMPREHENSIVE METABOLIC PANEL
ALT: 22 U/L (ref 0–44)
AST: 28 U/L (ref 15–41)
Albumin: 2.2 g/dL — ABNORMAL LOW (ref 3.5–5.0)
Alkaline Phosphatase: 115 U/L (ref 38–126)
Anion gap: 8 (ref 5–15)
BUN: 45 mg/dL — ABNORMAL HIGH (ref 8–23)
CHLORIDE: 107 mmol/L (ref 98–111)
CO2: 28 mmol/L (ref 22–32)
Calcium: 7.7 mg/dL — ABNORMAL LOW (ref 8.9–10.3)
Creatinine, Ser: 2.15 mg/dL — ABNORMAL HIGH (ref 0.61–1.24)
GFR calc Af Amer: 35 mL/min — ABNORMAL LOW (ref >60)
GFR calc non Af Amer: 30 mL/min — ABNORMAL LOW (ref >60)
Glucose, Bld: 146 mg/dL — ABNORMAL HIGH (ref 70–99)
POTASSIUM: 3.2 mmol/L — AB (ref 3.5–5.1)
SODIUM: 143 mmol/L (ref 135–145)
Total Bilirubin: 1.3 mg/dL — ABNORMAL HIGH (ref 0.3–1.2)
Total Protein: 4.8 g/dL — ABNORMAL LOW (ref 6.5–8.1)

## 2017-12-31 LAB — CBC
HCT: 44.5 % (ref 39.0–52.0)
Hemoglobin: 14.3 g/dL (ref 13.0–17.0)
MCH: 26.5 pg (ref 26.0–34.0)
MCHC: 32.1 g/dL (ref 30.0–36.0)
MCV: 82.4 fL (ref 80.0–100.0)
Platelets: 110 10*3/uL — ABNORMAL LOW (ref 150–400)
RBC: 5.4 MIL/uL (ref 4.22–5.81)
RDW: 16.3 % — ABNORMAL HIGH (ref 11.5–15.5)
WBC: 10 10*3/uL (ref 4.0–10.5)
nRBC: 0 % (ref 0.0–0.2)

## 2017-12-31 LAB — PHOSPHORUS: Phosphorus: 3.6 mg/dL (ref 2.5–4.6)

## 2017-12-31 LAB — MAGNESIUM: Magnesium: 1.9 mg/dL (ref 1.7–2.4)

## 2017-12-31 LAB — HEPARIN LEVEL (UNFRACTIONATED): Heparin Unfractionated: 0.47 IU/mL (ref 0.30–0.70)

## 2017-12-31 LAB — CORTISOL-AM, BLOOD: Cortisol - AM: 4.7 ug/dL — ABNORMAL LOW (ref 6.7–22.6)

## 2017-12-31 MED ORDER — METOPROLOL TARTRATE 25 MG PO TABS
12.5000 mg | ORAL_TABLET | Freq: Two times a day (BID) | ORAL | Status: DC
  Administered 2017-12-31 (×2): 12.5 mg via ORAL
  Filled 2017-12-31 (×3): qty 1

## 2017-12-31 MED ORDER — INSULIN ASPART 100 UNIT/ML ~~LOC~~ SOLN
0.0000 [IU] | Freq: Three times a day (TID) | SUBCUTANEOUS | Status: DC
  Administered 2018-01-02 – 2018-01-04 (×6): 1 [IU] via SUBCUTANEOUS
  Administered 2018-01-06: 2 [IU] via SUBCUTANEOUS
  Administered 2018-01-06: 1 [IU] via SUBCUTANEOUS
  Administered 2018-01-06: 2 [IU] via SUBCUTANEOUS
  Administered 2018-01-07: 1 [IU] via SUBCUTANEOUS
  Administered 2018-01-07 (×2): 2 [IU] via SUBCUTANEOUS
  Administered 2018-01-08 – 2018-01-10 (×8): 1 [IU] via SUBCUTANEOUS
  Administered 2018-01-11 (×2): 2 [IU] via SUBCUTANEOUS
  Administered 2018-01-12 – 2018-01-13 (×2): 1 [IU] via SUBCUTANEOUS

## 2017-12-31 MED ORDER — POTASSIUM CHLORIDE 20 MEQ/15ML (10%) PO SOLN
20.0000 meq | Freq: Once | ORAL | Status: AC
  Administered 2017-12-31: 20 meq via ORAL
  Filled 2017-12-31: qty 30

## 2017-12-31 MED ORDER — COSYNTROPIN 0.25 MG IJ SOLR
0.2500 mg | Freq: Once | INTRAMUSCULAR | Status: AC
  Administered 2018-01-01: 0.25 mg via INTRAVENOUS
  Filled 2017-12-31: qty 0.25

## 2017-12-31 MED ORDER — LEVALBUTEROL HCL 0.63 MG/3ML IN NEBU
0.6300 mg | INHALATION_SOLUTION | Freq: Four times a day (QID) | RESPIRATORY_TRACT | Status: DC
  Administered 2017-12-31 – 2018-01-01 (×4): 0.63 mg via RESPIRATORY_TRACT
  Filled 2017-12-31 (×4): qty 3

## 2017-12-31 NOTE — Progress Notes (Signed)
Subjective: He remains intubated and on the ventilator.  No new problems or complaints are noted.  He has been able to diurese some yesterday.  He remains on pressors but the dose is decreased.  Objective: Vital signs in last 24 hours: Temp:  [97.5 F (36.4 C)-98.3 F (36.8 C)] 98.3 F (36.8 C) (12/05 0726) Pulse Rate:  [51-102] 70 (12/05 0745) Resp:  [18-23] 20 (12/05 0745) BP: (77-126)/(52-99) 103/92 (12/05 0745) SpO2:  [97 %-100 %] 100 % (12/05 0745) FiO2 (%):  [40 %-50 %] 40 % (12/05 0307) Weight:  [94.9 kg] 94.9 kg (12/05 0427) Weight change:  Last BM Date: 12/28/17  Intake/Output from previous day: 12/04 0701 - 12/05 0700 In: 3047.7 [I.V.:1643.8; NG/GT:1104; IV Piggyback:300] Out: 4150 [Urine:4150]  PHYSICAL EXAM General appearance: Intubated sedated looks comfortable Resp: rales bilaterally Cardio: irregularly irregular rhythm GI: soft, non-tender; bowel sounds normal; no masses,  no organomegaly Extremities: extremities normal, atraumatic, no cyanosis or edema  Lab Results:  Results for orders placed or performed during the hospital encounter of 12/28/17 (from the past 48 hour(s))  Draw ABG 1 hour after initiation of ventilator     Status: Abnormal   Collection Time: 12/29/17  8:50 AM  Result Value Ref Range   FIO2 100.00    O2 Content 243.0 L/min   Delivery systems VENTILATOR    Mode PRESSURE REGULATED VOLUME CONTROL    VT 540 mL   LHR 20 resp/min   Peep/cpap 5.0 cm H20   pH, Arterial 7.409 7.350 - 7.450   pCO2 arterial 38.4 32.0 - 48.0 mmHg   pO2, Arterial 243 (H) 83.0 - 108.0 mmHg   Acid-base deficit 0.3 0.0 - 2.0 mmol/L   O2 Saturation  %    CRITICAL RESULT CALLED TO, READ BACK BY AND VERIFIED WITH:    Comment: MURPHY,E.RN AT 0911 12/29/17 BY Brann,L.RRT   Patient temperature 37.0    Collection site LEFT BRACHIAL    Drawn by [id number]    Sample type ARTERIAL DRAW     Comment: Performed at [facility], [address], [place], [place] [address]   Heparin level (unfractionated)     Status: Abnormal   Collection Time: 12/29/17 11:13 AM  Result Value Ref Range   Heparin Unfractionated 1.32 (H) 0.30 - 0.70 IU/mL    Comment: RESULTS CONFIRMED BY MANUAL DILUTION Performed at [facility], [address], [place], [place] [address]   APTT     Status: Abnormal   Collection Time: 12/29/17 11:13 AM  Result Value Ref Range   aPTT 40 (H) 24 - 36 seconds    Comment:        IF BASELINE aPTT IS ELEVATED, SUGGEST PATIENT RISK ASSESSMENT BE USED TO DETERMINE APPROPRIATE ANTICOAGULANT THERAPY. Performed at [facility], [address], [place], [place] [address]   Glucose, capillary     Status: Abnormal   Collection Time: 12/29/17 11:35 AM  Result Value Ref Range   Glucose-Capillary 119 (H) 70 - 99 mg/dL  Glucose, capillary     Status: None   Collection Time: 12/29/17  4:35 PM  Result Value Ref Range   Glucose-Capillary 94 70 - 99 mg/dL  Glucose, capillary     Status: Abnormal   Collection Time: 12/29/17  7:49 PM  Result Value Ref Range   Glucose-Capillary 131 (H) 70 - 99 mg/dL   Comment 1 Notify RN    Comment 2 Document in Chart   Glucose, capillary     Status: Abnormal   Collection  Time: 12/29/17 11:33 PM  Result Value Ref Range   Glucose-Capillary 169 (H) 70 - 99 mg/dL   Comment 1 Notify RN    Comment 2 Document in Chart   Heparin level (unfractionated)     Status: Abnormal   Collection Time: 12/30/17  1:07 AM  Result Value Ref Range   Heparin Unfractionated 0.94 (H) 0.30 - 0.70 IU/mL    Comment: (NOTE) If heparin results are below expected values, and patient dosage has  been confirmed, suggest follow up testing of antithrombin III levels. Performed at [facility], [address] [place], [place] [address]   APTT     Status: Abnormal   Collection Time: 12/30/17  1:07 AM  Result Value Ref Range   aPTT 189 (HH) 24 - 36 seconds    Comment:        IF BASELINE aPTT IS ELEVATED, SUGGEST PATIENT RISK ASSESSMENT BE USED TO  DETERMINE APPROPRIATE ANTICOAGULANT THERAPY. CRITICAL RESULT CALLED TO, READ BACK BY AND VERIFIED WITH: R WAGONER,RN @0153 12/30/17 MKELLY Performed at [facility], [address] [place], [place] [address]   Glucose, capillary     Status: Abnormal   Collection Time: 12/30/17  3:39 AM  Result Value Ref Range   Glucose-Capillary 135 (H) 70 - 99 mg/dL   Comment 1 Notify RN    Comment 2 Document in Chart   Basic metabolic panel     Status: Abnormal   Collection Time: 12/30/17  4:00 AM  Result Value Ref Range   Sodium 140 135 - 145 mmol/L   Potassium 3.7 3.5 - 5.1 mmol/L   Chloride 106 98 - 111 mmol/L   CO2 23 22 - 32 mmol/L   Glucose, Bld 151 (H) 70 - 99 mg/dL   BUN 44 (H) 8 - 23 mg/dL   Creatinine, Ser 2.47 (H) 0.61 - 1.24 mg/dL   Calcium 8.1 (L) 8.9 - 10.3 mg/dL   GFR calc non Af Amer 25 (L) >60 mL/min   GFR calc Af Amer 29 (L) >60 mL/min   Anion gap 11 5 - 15    Comment: Performed at [facility], [address] [place], [place] [address]  CBC     Status: Abnormal   Collection Time: 12/30/17  4:00 AM  Result Value Ref Range   WBC 8.6 4.0 - 10.5 K/uL   RBC 5.31 4.22 - 5.81 MIL/uL   Hemoglobin 14.1 13.0 - 17.0 g/dL   HCT 43.7 39.0 - 52.0 %   MCV 82.3 80.0 - 100.0 fL   MCH 26.6 26.0 - 34.0 pg   MCHC 32.3 30.0 - 36.0 g/dL   RDW 16.7 (H) 11.5 - 15.5 %   Platelets 117 (L) 150 - 400 K/uL    Comment: PLATELET COUNT CONFIRMED BY SMEAR SPECIMEN CHECKED FOR CLOTS Immature Platelet Fraction may be clinically indicated, consider ordering this additional test [id number]    nRBC 0.0 0.0 - 0.2 %    Comment: Performed at [facility], [address] [place], [place] [address]  Glucose, capillary     Status: Abnormal   Collection Time: 12/30/17  8:16 AM  Result Value Ref Range   Glucose-Capillary 148 (H) 70 - 99 mg/dL  Magnesium     Status: None   Collection Time: 12/30/17  8:32 AM  Result Value Ref Range   Magnesium 1.9 1.7 - 2.4 mg/dL    Comment: Performed at [facility], [address] [place], [place] [address]  Phosphorus       Status: None   Collection Time: 12/30/17  8:32 AM  Result Value Ref Range   Phosphorus 4.2 2.5 - 4.6 mg/dL    Comment: Performed at [facility], [address] [place], [place] [address]  Heparin level (unfractionated)     Status: None   Collection Time: 12/30/17 10:24 AM  Result Value Ref Range   Heparin Unfractionated 0.61 0.30 - 0.70 IU/mL    Comment: (NOTE) If heparin results are below expected values, and patient dosage has  been confirmed, suggest follow up testing of antithrombin III levels. Performed at [facility], [address] [place], [place] [address]   APTT     Status: Abnormal   Collection Time: 12/30/17 10:24 AM  Result Value Ref Range   aPTT 178 (HH) 24 - 36 seconds    Comment:        IF BASELINE aPTT IS ELEVATED, SUGGEST PATIENT RISK ASSESSMENT BE USED TO DETERMINE APPROPRIATE ANTICOAGULANT THERAPY. REPEATED TO VERIFY CRITICAL RESULT CALLED TO, READ BACK BY AND VERIFIED WITH: ASHLEY SHELTON AT 1052 BY HFLYNT 12/30/17 Performed at [facility], [address] [place], [place] [address]   Glucose, capillary     Status: Abnormal   Collection Time: 12/30/17 11:18 AM  Result Value Ref Range   Glucose-Capillary 137 (H) 70 - 99 mg/dL  Glucose, capillary     Status: Abnormal   Collection Time: 12/30/17  4:26 PM  Result Value Ref Range   Glucose-Capillary 160 (H) 70 - 99 mg/dL  Magnesium     Status: None   Collection Time: 12/30/17  5:33 PM  Result Value Ref Range   Magnesium 1.8 1.7 - 2.4 mg/dL    Comment: Performed at [facility], [address] [place], [place] [address]  Phosphorus     Status: None   Collection Time: 12/30/17  5:33 PM  Result Value Ref Range   Phosphorus 3.8 2.5 - 4.6 mg/dL    Comment: Performed at [facility], [address] [place], [place] [address]  Glucose, capillary     Status: Abnormal   Collection Time: 12/30/17  7:38 PM  Result Value Ref Range   Glucose-Capillary  182 (H) 70 - 99 mg/dL  Glucose, capillary     Status: Abnormal   Collection Time: 12/30/17 11:41 PM  Result Value Ref Range   Glucose-Capillary 174 (H) 70 - 99 mg/dL  Glucose, capillary     Status: Abnormal   Collection Time: 12/31/17  4:14 AM  Result Value Ref Range   Glucose-Capillary 135 (H) 70 - 99 mg/dL   Comment 1 Notify RN    Comment 2 Document in Chart   CBC     Status: Abnormal   Collection Time: 12/31/17  4:15 AM  Result Value Ref Range   WBC 10.0 4.0 - 10.5 K/uL   RBC 5.40 4.22 - 5.81 MIL/uL   Hemoglobin 14.3 13.0 - 17.0 g/dL   HCT 44.5 39.0 - 52.0 %   MCV 82.4 80.0 - 100.0 fL   MCH 26.5 26.0 - 34.0 pg   MCHC 32.1 30.0 - 36.0 g/dL   RDW 16.3 (H) 11.5 - 15.5 %   Platelets 110 (L) 150 - 400 K/uL    Comment: CONSISTENT WITH PREVIOUS RESULT   nRBC 0.0 0.0 - 0.2 %    Comment: Performed at [facility], [address] [place],  [address]  Magnesium     Status: None   Collection Time: 12/31/17  4:15 AM  Result Value Ref Range     Magnesium 1.9 1.7 - 2.4 mg/dL    Comment: Performed at [facility], [address] [place], [place] [address]  Phosphorus     Status: None   Collection Time: 12/31/17  4:15 AM  Result Value Ref Range   Phosphorus 3.6 2.5 - 4.6 mg/dL    Comment: Performed at [facility], [address] [place],  [place] [address]  Heparin level (unfractionated)     Status: None   Collection Time: 12/31/17  4:15 AM  Result Value Ref Range   Heparin Unfractionated 0.47 0.30 - 0.70 IU/mL    Comment: (NOTE) If heparin results are below expected values, and patient dosage has  been confirmed, suggest follow up testing of antithrombin III levels. Performed at [facility], [address] [place], [place] [address]   Comprehensive metabolic panel     Status: Abnormal   Collection Time: 12/31/17  4:15 AM  Result Value Ref Range   Sodium 143 135 - 145 mmol/L   Potassium 3.2 (L) 3.5 - 5.1 mmol/L   Chloride 107 98 - 111 mmol/L   CO2 28 22 - 32 mmol/L    Glucose, Bld 146 (H) 70 - 99 mg/dL   BUN 45 (H) 8 - 23 mg/dL   Creatinine, Ser 2.15 (H) 0.61 - 1.24 mg/dL   Calcium 7.7 (L) 8.9 - 10.3 mg/dL   Total Protein 4.8 (L) 6.5 - 8.1 g/dL   Albumin 2.2 (L) 3.5 - 5.0 g/dL   AST 28 15 - 41 U/L   ALT 22 0 - 44 U/L   Alkaline Phosphatase 115 38 - 126 U/L   Total Bilirubin 1.3 (H) 0.3 - 1.2 mg/dL   GFR calc non Af Amer 30 (L) >60 mL/min   GFR calc Af Amer 35 (L) >60 mL/min   Anion gap 8 5 - 15    Comment: Performed at [facility], [address] [place], [place] [address]  Glucose, capillary     Status: Abnormal   Collection Time: 12/31/17  7:27 AM  Result Value Ref Range   Glucose-Capillary 142 (H) 70 - 99 mg/dL    ABGS Recent Labs    12/29/17 0454 12/29/17 0850  PHART 7.255* 7.409  PO2ART 73.7* 243*  HCO3 20.2  --    CULTURES Recent Results (from the past 240 hour(s))  MRSA PCR Screening     Status: None   Collection Time: 12/28/17 12:42 PM  Result Value Ref Range Status   MRSA by PCR NEGATIVE NEGATIVE Final    Comment:        The GeneXpert MRSA Assay (FDA approved for NASAL specimens only), is one component of a comprehensive MRSA colonization surveillance program. It is not intended to diagnose MRSA infection nor to guide or monitor treatment for MRSA infections. Performed at [facility], [address] , [place] [address]    Studies/Results: Portable Chest Xray  Result Date: 12/30/2017 CLINICAL DATA:  Respiratory failure. EXAM: PORTABLE CHEST 1 VIEW COMPARISON:  Chest radiograph 12/28/2017 and CT 12/29/2017 FINDINGS: Endotracheal tube terminates 2.4 cm above the carina. Enteric tube courses into the upper abdomen. Enlargement of the cardiac silhouette is slightly less prominent than on the prior study. Perihilar and bibasilar opacities have mildly improved. No large pleural effusion or pneumothorax is identified. IMPRESSION: Mild improvement of bilateral lung opacities suggesting improved edema. Superimposed  pneumonia is possible in the bases. Electronically Signed   By: Allen  Grady M.D.   On: 12/30/2017 08:37    Medications:  Prior to   Admission:  Medications Prior to Admission  Medication Sig Dispense Refill Last Dose  . acetaminophen (TYLENOL) 325 MG tablet Take 650 mg by mouth every 6 (six) hours as needed for headache.   Past Week at Unknown time  . amiodarone (PACERONE) 200 MG tablet Take 1 tablet (200 mg total) by mouth daily. 90 tablet 3 12/27/2017 at Unknown time  . aspirin EC 81 MG tablet Take 81 mg by mouth daily.   12/28/2017 at Unknown time  . atorvastatin (LIPITOR) 40 MG tablet Take 1 tablet (40 mg total) by mouth daily. 90 tablet 2 12/27/2017 at Unknown time  . isosorbide-hydrALAZINE (BIDIL) 20-37.5 MG tablet Take 1 tablet by mouth 3 (three) times daily. (Patient taking differently: Take 1 tablet by mouth 2 (two) times daily. ) 90 tablet 11 12/27/2017 at Unknown time  . metoprolol succinate (TOPROL-XL) 25 MG 24 hr tablet Take 1 tablet (25 mg total) by mouth daily. 90 tablet 2 12/27/2017 at 0700am  . potassium chloride SA (K-DUR,KLOR-CON) 20 MEQ tablet Take 2 tablets (40 mEq total) by mouth daily. (Patient taking differently: Take 20 mEq by mouth 2 (two) times daily. ) 180 tablet 2 12/27/2017 at Unknown time  . torsemide (DEMADEX) 20 MG tablet Take 2 tablets (40 mg total) by mouth 2 (two) times daily. 180 tablet 2 12/27/2017 at Unknown time  . edoxaban (SAVAYSA) 60 MG TABS tablet Take 60 mg by mouth daily. (Patient not taking: Reported on 12/28/2017) 90 tablet 2 Not Taking at Unknown time   Scheduled: . aspirin  81 mg Per Tube Daily  . atorvastatin  40 mg Per Tube q1800  . chlorhexidine gluconate (MEDLINE KIT)  15 mL Mouth Rinse BID  . Chlorhexidine Gluconate Cloth  6 each Topical Daily  . feeding supplement (VITAL HIGH PROTEIN)  1,000 mL Per Tube Q24H  . furosemide  40 mg Intravenous TID  . insulin aspart  0-9 Units Subcutaneous Q4H  . ipratropium  0.5 mg Nebulization Q6H  . mouth rinse   15 mL Mouth Rinse 10 times per day  . pantoprazole (PROTONIX) IV  40 mg Intravenous Q24H  . potassium chloride  20 mEq Per Tube Daily  . sodium chloride flush  10-40 mL Intracatheter Q12H  . sodium chloride flush  3 mL Intravenous Q12H   Continuous: . sodium chloride Stopped (12/31/17 0001)  . amiodarone 30 mg/hr (12/31/17 0745)  . ampicillin-sulbactam (UNASYN) IV 200 mL/hr at 12/31/17 0745  . heparin 900 Units/hr (12/31/17 0745)  . norepinephrine (LEVOPHED) Adult infusion 2 mcg/min (12/31/17 0745)  . phenylephrine (NEO-SYNEPHRINE) Adult infusion Stopped (12/30/17 0906)  . propofol (DIPRIVAN) infusion 10 mcg/kg/min (12/31/17 0745)   PRN:sodium chloride, acetaminophen, fentaNYL (SUBLIMAZE) injection, fentaNYL (SUBLIMAZE) injection, levalbuterol, morphine injection, ondansetron (ZOFRAN) IV, sodium chloride flush, sodium chloride flush  Assesment: He was admitted with acute on chronic combined heart failure with atrial fib and rapid ventricular response.  He developed increasing respiratory failure and ended up having to be intubated and placed on mechanical ventilation.  He is improving.  He has been able to diurese somewhat.  He has been on pressors to maintain his blood pressure but the dose of that has been able to be decreased.  He is now on 40% oxygen.  Chest x-ray has not been officially read yet but my personal review would indicate that it looks better  He has atrial fib and his heart rate is much better controlled with amiodarone  He has acute on chronic renal failure and his renal function   is better.  He remains critically ill with multisystem failure  He has diabetes on sliding scale Principal Problem:   Acute on chronic systolic HF (heart failure) (HCC) Active Problems:   Hyperlipidemia   HTN (hypertension)   Gastroesophageal reflux disease   Type 2 diabetes mellitus with diabetic nephropathy, without long-term current use of insulin (HCC)   Atrial fibrillation with rapid  ventricular response (HCC)   Acute on chronic combined systolic and diastolic CHF (congestive heart failure) (HCC)   Acute respiratory failure (HCC)   Stage 3 chronic kidney disease (HCC)   Hypotension   Acute respiratory failure with hypoxia and hypercarbia (HCC)   Acute renal failure superimposed on stage 3 chronic kidney disease (HCC)    Plan: Continue treatments.  If we can get him off the pressor we can try to wean him today.      LOS: 3 days   HAWKINS,EDWARD L 12/31/2017, 7:53 AM  
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
## 2017-12-31 NOTE — Progress Notes (Signed)
Pt extubated at this time to 2lpm [place]. There was no complication noted during procedure. Hemodynamically stable throughout. HR 120/ SPO2 99.
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
## 2017-12-31 NOTE — Progress Notes (Signed)
Progress Note  Patient Name: Phillip Wilson Date of Encounter: 12/31/2017  Primary Cardiologist: Dr. Kate Sable  Subjective   Patient sedated on ventilator.  Inpatient Medications    Scheduled Meds: . aspirin  81 mg Per Tube Daily  . atorvastatin  40 mg Per Tube q1800  . chlorhexidine gluconate (MEDLINE KIT)  15 mL Mouth Rinse BID  . Chlorhexidine Gluconate Cloth  6 each Topical Daily  . feeding supplement (VITAL HIGH PROTEIN)  1,000 mL Per Tube Q24H  . furosemide  40 mg Intravenous TID  . insulin aspart  0-9 Units Subcutaneous Q4H  . ipratropium  0.5 mg Nebulization Q6H  . mouth rinse  15 mL Mouth Rinse 10 times per day  . pantoprazole (PROTONIX) IV  40 mg Intravenous Q24H  . potassium chloride  20 mEq Per Tube Daily  . sodium chloride flush  10-40 mL Intracatheter Q12H  . sodium chloride flush  3 mL Intravenous Q12H   Continuous Infusions: . sodium chloride Stopped (12/31/17 0001)  . amiodarone 30 mg/hr (12/31/17 0745)  . ampicillin-sulbactam (UNASYN) IV 200 mL/hr at 12/31/17 0745  . heparin 900 Units/hr (12/31/17 0745)  . norepinephrine (LEVOPHED) Adult infusion 2 mcg/min (12/31/17 0745)  . phenylephrine (NEO-SYNEPHRINE) Adult infusion Stopped (12/30/17 0906)  . propofol (DIPRIVAN) infusion 10 mcg/kg/min (12/31/17 0745)   PRN Meds: sodium chloride, acetaminophen, fentaNYL (SUBLIMAZE) injection, fentaNYL (SUBLIMAZE) injection, levalbuterol, morphine injection, ondansetron (ZOFRAN) IV, sodium chloride flush, sodium chloride flush   Vital Signs    Vitals:   12/31/17 0726 12/31/17 0730 12/31/17 0745 12/31/17 0819  BP:  94/70 (!) 103/92   Pulse:  65 70   Resp:  20 20   Temp: 98.3 F (36.8 C)     TempSrc: Oral     SpO2:  100% 100% 100%  Weight:      Height:        Intake/Output Summary (Last 24 hours) at 12/31/2017 0847 Last data filed at 12/31/2017 0745 Gross per 24 hour  Intake 3138.79 ml  Output 4350 ml  Net -1211.21 ml   Filed Weights   12/28/17 0844 12/31/17 0427  Weight: 93 kg 94.9 kg    Telemetry    Atrial fibrillation, currently rate controlled.  Personally reviewed.  Physical Exam   GEN:  Sedated on ventilator.   Neck: No JVD. Cardiac:  Irregularly irregular, no gallop.  Respiratory: Nonlabored.  Basilar crackles. GI: Soft, nontender, bowel sounds present. MS: 2-3+ leg edema; No deformity. Neuro:   Sedated. Psych: Sedated.  Labs    Chemistry Recent Labs  Lab 12/29/17 0514 12/30/17 0400 12/31/17 0415  NA 139 140 143  K 4.2 3.7 3.2*  CL 103 106 107  CO2 _0 GLUCOSE 195* 151* 146*  BUN 46* 44* 45*  CREATININE 2.57* 2.47* 2.15*  CALCIUM 8.2* 8.1* 7.7*  PROT 6.8  --  4.8*  ALBUMIN 3.3*  --  2.2*  AST 94*  --  28  ALT 40  --  22  ALKPHOS 197*  --  115  BILITOT 2.3*  --  1.3*  GFRNONAA 24* 25* 30*  GFRAA 28* 29* 35*  ANIONGAP _1 Hematology Recent Labs  Lab 12/29/17 0514 12/30/17 0400 12/31/17 0415  WBC 8.3 8.6 10.0  RBC 5.84* 5.31 5.40  HGB 15.6 14.1 14.3  HCT 50.4 43.7 44.5  MCV 86.3 82.3 82.4  MCH 26.7 26.6 26.5  MCHC 31.0 32.3 32.1  RDW 18.5* 16.7* 16.3*  PLT 137* 117* 110*    Cardiac Enzymes Recent Labs  Lab 12/28/17 0906 12/28/17 1812 12/28/17 2319 12/29/17 0514  TROPONINI 0.08* 0.08* 0.08* 0.09*   No results for input(s): TROPIPOC in the last 168 hours.   BNP Recent Labs  Lab 12/28/17 0906  BNP 3,321.0*     Radiology    Dg Chest Port 1 View  Result Date: 12/31/2017 CLINICAL DATA:  Ventilator dependent respiratory failure. Follow-up pulmonary edema and bibasilar atelectasis and/or pneumonia. EXAM: PORTABLE CHEST 1 VIEW COMPARISON:  12/30/2017, 12/28/2017 and earlier, including CT chest 12/29/2017. FINDINGS: Endotracheal tube tip in satisfactory position projecting approximately 4 cm above the carina. Nasogastric tube courses below the diaphragm into the stomach. Interval resolution of pulmonary edema. Persistent dense consolidation in the LEFT  LOWER LOBE and mild to moderate atelectasis at the RIGHT lung base. No new pulmonary parenchymal abnormalities. IMPRESSION: 1.  Support apparatus satisfactory. 2. Resolution of pulmonary edema. 3. Stable dense LEFT LOWER LOBE atelectasis and pneumonia and mild to moderate RIGHT basilar atelectasis. 4. No new abnormalities. Electronically Signed   By: Evangeline Dakin M.D.   On: 12/31/2017 08:42   Portable Chest Xray  Result Date: 12/30/2017 CLINICAL DATA:  Respiratory failure. EXAM: PORTABLE CHEST 1 VIEW COMPARISON:  Chest radiograph 12/28/2017 and CT 12/29/2017 FINDINGS: Endotracheal tube terminates 2.4 cm above the carina. Enteric tube courses into the upper abdomen. Enlargement of the cardiac silhouette is slightly less prominent than on the prior study. Perihilar and bibasilar opacities have mildly improved. No large pleural effusion or pneumothorax is identified. IMPRESSION: Mild improvement of bilateral lung opacities suggesting improved edema. Superimposed pneumonia is possible in the bases. Electronically Signed   By: Logan Bores M.D.   On: 12/30/2017 08:37    Cardiac Studies   Echocardiogram 12/29/2017: Study Conclusions  - Left ventricle: The cavity size was normal. Wall thickness was increased in a pattern of mild LVH. Systolic function was severely reduced. The estimated ejection fraction was in the range of 15% to 20%. Diffuse hypokinesis. The study was not technically sufficient to allow evaluation of LV diastolic dysfunction due to atrial fibrillation. - Aortic valve: Mildly calcified annulus. Trileaflet. - Mitral valve: Mildly calcified annulus. There was mild regurgitation. - Right ventricle: The cavity size was moderately dilated. Systolic function was severely reduced. - Right atrium: The atrium was mildly dilated. - Tricuspid valve: There was mild regurgitation. Peak RV-RA gradient (S): 36 mm Hg. - Pulmonary arteries: Systolic pressure could not be  accurately estimated. - Inferior vena cava: The vessel was dilated. Patient on ventilator. - Pericardium, extracardiac: A small pericardial effusion was identified posterior to the heart.  Patient Profile     72 y.o. male with a history of nonischemic cardiomyopathy, paroxysmal to persistent atrial fibrillation, hypertension, type 2 diabetes mellitus, CKD stage III, and hyperlipidemiawho is being seen for the evaluation ofrapid atrial fibrillation and acute on chronic systolic heart failure.  Assessment & Plan    1.  Acute on chronic systolic heart failure.  IV Lasix was increased yesterday with increasing urine output and actually creatinine starting to come down at this point.  Chest x-ray today shows improved pulmonary edema, stable left lower lobe infiltrate and mild to moderate right basilar atelectasis.  2.  Persistent atrial fibrillation.  Heart rate control is adequate on IV amiodarone.  Also continues on IV heparin in lieu of Savaysa.  3.  Acute on chronic renal insufficiency with CKD stage 3.  Creatinine has come down from 2.4-2.1.  4.  Nonischemic cardiomyopathy with LVEF 15 to 20% and severe RV dysfunction.  5.  Minor increase in troponin I with flat pattern not consistent with ACS.  6.  Acute hypoxic and hypercapnic respiratory failure, still requiring mechanical ventilation with management per Dr. Luan Pulling.  Continue current dose of IV Lasix with improved pulmonary edema, increased urine output, and improving creatinine.  Patient has not required pressor support since coming off Neo-Synephrine yesterday.  Continue aspirin and Lipitor as well as IV amiodarone and IV heparin.  Signed, Rozann Lesches, MD  12/31/2017, 8:47 AM

## 2017-12-31 NOTE — Progress Notes (Signed)
Began wean at this time. Pt is following commands and alert. He is getting good tidal volumes and all vitals are stable. RN removed all sedation. RT will cont to monitor patient.

## 2018-01-01 DIAGNOSIS — I5023 Acute on chronic systolic (congestive) heart failure: Secondary | ICD-10-CM

## 2018-01-01 DIAGNOSIS — N179 Acute kidney failure, unspecified: Secondary | ICD-10-CM

## 2018-01-01 DIAGNOSIS — I4819 Other persistent atrial fibrillation: Secondary | ICD-10-CM

## 2018-01-01 DIAGNOSIS — N183 Chronic kidney disease, stage 3 (moderate): Secondary | ICD-10-CM

## 2018-01-01 LAB — URINALYSIS, COMPLETE (UACMP) WITH MICROSCOPIC
Bilirubin Urine: NEGATIVE
Glucose, UA: NEGATIVE mg/dL
Ketones, ur: NEGATIVE mg/dL
Leukocytes, UA: NEGATIVE
Nitrite: NEGATIVE
Protein, ur: 30 mg/dL — AB
RBC / HPF: >50 RBC/hpf — ABNORMAL HIGH (ref 0–5)
Specific Gravity, Urine: 1.013 (ref 1.005–1.030)
pH: 6 (ref 5.0–8.0)

## 2018-01-01 LAB — COMPREHENSIVE METABOLIC PANEL
ALT: 22 U/L (ref 0–44)
AST: 42 U/L — ABNORMAL HIGH (ref 15–41)
Albumin: 2.3 g/dL — ABNORMAL LOW (ref 3.5–5.0)
Alkaline Phosphatase: 109 U/L (ref 38–126)
Anion gap: 10 (ref 5–15)
BUN: 41 mg/dL — ABNORMAL HIGH (ref 8–23)
CO2: 29 mmol/L (ref 22–32)
Calcium: 7.6 mg/dL — ABNORMAL LOW (ref 8.9–10.3)
Chloride: 107 mmol/L (ref 98–111)
Creatinine, Ser: 1.82 mg/dL — ABNORMAL HIGH (ref 0.61–1.24)
GFR calc Af Amer: 43 mL/min — ABNORMAL LOW (ref >60)
GFR calc non Af Amer: 37 mL/min — ABNORMAL LOW (ref >60)
Glucose, Bld: 104 mg/dL — ABNORMAL HIGH (ref 70–99)
POTASSIUM: 3.2 mmol/L — AB (ref 3.5–5.1)
Sodium: 146 mmol/L — ABNORMAL HIGH (ref 135–145)
Total Bilirubin: 1.3 mg/dL — ABNORMAL HIGH (ref 0.3–1.2)
Total Protein: 5 g/dL — ABNORMAL LOW (ref 6.5–8.1)

## 2018-01-01 LAB — HEPARIN LEVEL (UNFRACTIONATED): HEPARIN UNFRACTIONATED: 0.3 [IU]/mL (ref 0.30–0.70)

## 2018-01-01 LAB — CBC
HCT: 43 % (ref 39.0–52.0)
Hemoglobin: 13.7 g/dL (ref 13.0–17.0)
MCH: 26.8 pg (ref 26.0–34.0)
MCHC: 31.9 g/dL (ref 30.0–36.0)
MCV: 84 fL (ref 80.0–100.0)
PLATELETS: 77 10*3/uL — AB (ref 150–400)
RBC: 5.12 MIL/uL (ref 4.22–5.81)
RDW: 16.3 % — ABNORMAL HIGH (ref 11.5–15.5)
WBC: 7.2 10*3/uL (ref 4.0–10.5)
nRBC: 0 % (ref 0.0–0.2)

## 2018-01-01 LAB — GLUCOSE, CAPILLARY
GLUCOSE-CAPILLARY: 138 mg/dL — AB (ref 70–99)
Glucose-Capillary: 105 mg/dL — ABNORMAL HIGH (ref 70–99)
Glucose-Capillary: 120 mg/dL — ABNORMAL HIGH (ref 70–99)
Glucose-Capillary: 160 mg/dL — ABNORMAL HIGH (ref 70–99)
Glucose-Capillary: 86 mg/dL (ref 70–99)

## 2018-01-01 MED ORDER — METOPROLOL SUCCINATE ER 25 MG PO TB24
25.0000 mg | ORAL_TABLET | Freq: Every day | ORAL | Status: DC
  Administered 2018-01-01 – 2018-01-02 (×2): 25 mg via ORAL
  Filled 2018-01-01 (×2): qty 1

## 2018-01-01 MED ORDER — POTASSIUM CHLORIDE CRYS ER 20 MEQ PO TBCR
20.0000 meq | EXTENDED_RELEASE_TABLET | Freq: Every day | ORAL | Status: DC
  Administered 2018-01-01 – 2018-01-02 (×2): 20 meq via ORAL
  Filled 2018-01-01 (×2): qty 1

## 2018-01-01 MED ORDER — EDOXABAN TOSYLATE 30 MG PO TABS
30.0000 mg | ORAL_TABLET | ORAL | Status: DC
  Administered 2018-01-01 – 2018-01-12 (×12): 30 mg via ORAL
  Filled 2018-01-01 (×16): qty 1

## 2018-01-01 MED ORDER — LEVALBUTEROL HCL 0.63 MG/3ML IN NEBU: 0.6300 mg | INHALATION_SOLUTION | Freq: Four times a day (QID) | RESPIRATORY_TRACT | Status: DC | PRN
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
## 2018-01-01 NOTE — NC FL2 (Signed)
[place] MEDICAID FL2 LEVEL OF CARE SCREENING TOOL     IDENTIFICATION  Patient Name: Phillip Wilson Birthdate: January 23, 1946 Sex: male Admission Date (Current Location): 12/28/2017  [facility] and [place] Number:  [employer] and Address:  [facility] [address], [place]      Provider Number: [id number]  Attending Physician Name and Address:  Orson Eva, MD  Relative Name and Phone Number:  Dirk Dress [phone]    Current Level of Care: Hospital Recommended Level of Care: [place] Prior Approval Number:    Date Approved/Denied:   PASRR Number: [id number]  Discharge Plan: SNF    Current Diagnoses: Patient Active Problem List   Diagnosis Date Noted  . Acute respiratory failure with hypoxia and hypercarbia ([place]) 12/30/2017  . Acute renal failure superimposed on stage 3 chronic kidney disease ([place]) 12/30/2017  . Acute respiratory failure ([place]) 12/29/2017  . Stage 3 chronic kidney disease ([place])   . Hypotension   . Acute on chronic systolic HF (heart failure) ([place]) 12/28/2017  . CHF (congestive heart failure) ([place]) 02/27/2016  . Hypokalemia 02/05/2016  . CKD (chronic kidney disease), stage II 02/05/2016  . Bilateral lower extremity edema   . Acute on chronic combined systolic and diastolic CHF (congestive heart failure) ([place]) 01/06/2016  . Gastroesophageal reflux disease   . Type 2 diabetes mellitus with diabetic nephropathy, without long-term current use of insulin ([place])   . Cardiomyopathy ([place]) 12/28/2010  . Atrial fibrillation with rapid ventricular response ([place]) 12/28/2010  . Vitamin D deficiency 09/03/2010  . Hyperlipidemia 05/05/2006  . HTN (hypertension) 04/02/2006    Orientation RESPIRATION BLADDER Height & Weight     Self, Time, Situation, Place  Normal Continent Weight: 207 lb 0.2 oz (93.9 kg) Height:  5\' 8"  (172.7 cm)  BEHAVIORAL SYMPTOMS/MOOD NEUROLOGICAL BOWEL NUTRITION STATUS      Continent  Diet(see dc summary)  AMBULATORY STATUS COMMUNICATION OF NEEDS Skin   Extensive Assist Verbally Normal                       Personal Care Assistance Level of Assistance  Bathing, Feeding, Dressing Bathing Assistance: Limited assistance Feeding assistance: Independent Dressing Assistance: Limited assistance     Functional Limitations Info  Sight, Hearing, Speech Sight Info: Adequate Hearing Info: Adequate Speech Info: Impaired    SPECIAL CARE FACTORS FREQUENCY  PT (By licensed PT)     PT Frequency: 5 times week              Contractures Contractures Info: Not present    Additional Factors Info  Code Status, Allergies Code Status Info: full Allergies Info: Entresto           Current Medications (01/01/2018):  This is the current hospital active medication list Current Facility-Administered Medications  Medication Dose Route Frequency Provider Last Rate Last Dose  . 0.9 %  sodium chloride infusion  250 mL Intravenous PRN Reubin Milan, MD   Stopped at 12/31/17 0001  . acetaminophen (TYLENOL) tablet 650 mg  650 mg Per Tube Q4H PRN Barton Dubois, MD      . amiodarone (NEXTERONE PREMIX) 360-4.14 MG/200ML-% (1.8 mg/mL) IV infusion  30 mg/hr Intravenous Continuous Reubin Milan, MD 16.67 mL/hr at 01/01/18 1121 30 mg/hr at 01/01/18 1121  . Ampicillin-Sulbactam (UNASYN) 3 g in sodium chloride 0.9 % 100 mL IVPB  3 g Intravenous Q8H Barton Dubois, MD 200 mL/hr at 01/01/18 0744 3 g at 01/01/18  20  . aspirin chewable tablet 81 mg  81 mg Per Tube Daily Barton Dubois, MD   81 mg at 01/01/18 1036  . atorvastatin (LIPITOR) tablet 40 mg  40 mg Per Tube q1800 Barton Dubois, MD   40 mg at 12/31/17 1838  . Chlorhexidine Gluconate Cloth 2 % PADS 6 each  6 each Topical Daily Virl Cagey, MD   6 each at 12/31/17 1025  . fentaNYL (SUBLIMAZE) injection 50 mcg  50 mcg Intravenous Q15 min PRN Sinda Du, MD      . fentaNYL (SUBLIMAZE) injection 50 mcg  50 mcg  Intravenous Q2H PRN Sinda Du, MD   50 mcg at 12/29/17 0814  . furosemide (LASIX) injection 40 mg  40 mg Intravenous TID Satira Sark, MD   40 mg at 01/01/18 1036  . heparin ADULT infusion 100 units/mL (25000 units/261mL sodium chloride 0.45%)  900 Units/hr Intravenous Continuous Erenest Blank, RPH 9 mL/hr at 01/01/18 0700 900 Units/hr at 01/01/18 0700  . insulin aspart (novoLOG) injection 0-9 Units  0-9 Units Subcutaneous TID WC Tat, David, MD      . ipratropium (ATROVENT) nebulizer solution 0.5 mg  0.5 mg Nebulization Q6H Barton Dubois, MD   0.5 mg at 01/01/18 0915  . levalbuterol (XOPENEX) nebulizer solution 0.63 mg  0.63 mg Nebulization Q6H Orson Eva, MD   0.63 mg at 01/01/18 0915  . metoprolol succinate (TOPROL-XL) 24 hr tablet 25 mg  25 mg Oral Daily Satira Sark, MD   25 mg at 01/01/18 1036  . morphine 2 MG/ML injection 1 mg  1 mg Intravenous Q3H PRN Reubin Milan, MD      . norepinephrine (LEVOPHED) 4mg  in D5W 247mL premix infusion  0-40 mcg/min Intravenous Titrated Sinda Du, MD   Stopped at 12/31/17 [phone]  . ondansetron (ZOFRAN) injection 4 mg  4 mg Intravenous Q6H PRN Reubin Milan, MD      . pantoprazole (PROTONIX) injection 40 mg  40 mg Intravenous Q24H Barton Dubois, MD   40 mg at 01/01/18 1036  . phenylephrine (NEOSYNEPHRINE) 10-0.9 MG/250ML-% infusion  0-400 mcg/min Intravenous Titrated Barton Dubois, MD   Stopped at 12/30/17 [phone]  . potassium chloride SA (K-DUR,KLOR-CON) CR tablet 20 mEq  20 mEq Oral Daily Tat, David, MD      . propofol (DIPRIVAN) 1000 MG/100ML infusion  5-80 mcg/kg/min Intravenous Titrated Reubin Milan, MD   Stopped at 12/31/17 [phone]  . sodium chloride flush (NS) 0.9 % injection 10-40 mL  10-40 mL Intracatheter Q12H Virl Cagey, MD   10 mL at 01/01/18 1037  . sodium chloride flush (NS) 0.9 % injection 10-40 mL  10-40 mL Intracatheter PRN Virl Cagey, MD      . sodium chloride flush (NS) 0.9 % injection 3  mL  3 mL Intravenous Q12H Reubin Milan, MD   3 mL at 12/31/17 2256  . sodium chloride flush (NS) 0.9 % injection 3 mL  3 mL Intravenous PRN Reubin Milan, MD         Discharge Medications: Please see discharge summary for a list of discharge medications.  Relevant Imaging Results:  Relevant Lab Results:   Additional Information SSN: [id number]  Shade Flood, [place]

## 2018-01-01 NOTE — Evaluation (Signed)
Physical Therapy Evaluation Patient Details Name: Phillip Wilson MRN: [id number] DOB: [birth year] Today's Date: 01/01/2018   History of Present Illness  Phillip Wilson is a 72 y.o. male with past medical history significant for nonischemic cardiomyopathy (last echo demonstrating ejection fraction 15-20%; 2017), type 2 diabetes mellitus with nephropathy, chronic kidney disease is stage III, essential hypertension, gastroesophageal reflux disease hyperlipidemia and paroxysmal atrial fibrillation (on [place]); who presented to the emergency department with complaints of shortness of breath and worsening swelling.  Patient reports symptoms have been present for the last week or so and worsening in the last 3 days prior to admission. Patient expressed running out of his medication approximately 6-7 days prior to hospitalization and reported not the best compliance with low-sodium diet.  He was seen by primary care physician approximately about 6 days prior to this visit and at that time had an elevated BNP and was short of breath; patient was instructed to come to the hospital for admission and to receive acute diuresis; his medications were refilled and he will decide took 10 by mouth instead of being admitted at that time.  Symptoms failed to improve after restarting medication and he presented to the emergency department for further evaluation and management.  Patient reported orthopnea    Clinical Impression  Patient very unsteady on feet and unable to come to complete standing, limited to 3-4 labored side steps at bedside due to BLE weakness.  Unable to attempt transferring to chair or using RW due to patient's HR increasing above 150 BPM with exertion - RN aware.  Patient put back to bed after therapy.  Patient will benefit from continued physical therapy in hospital and recommended venue below to increase strength, balance, endurance for safe ADLs and gait.    Follow Up Recommendations SNF     Equipment Recommendations  None recommended by PT    Recommendations for Other Services       Precautions / Restrictions Precautions Precautions: Fall Restrictions Weight Bearing Restrictions: No      Mobility  Bed Mobility Overal bed mobility: Needs Assistance Bed Mobility: Supine to Sit;Sit to Supine     Supine to sit: Min assist Sit to supine: Min assist   General bed mobility comments: slow labored movement  Transfers Overall transfer level: Needs assistance Equipment used: 1 person hand held assist Transfers: Sit to/from Stand Sit to Stand: Mod assist            Ambulation/Gait Ambulation/Gait assistance: Mod assist;Max assist Gait Distance (Feet): 3 Feet Assistive device: 1 person hand held assist Gait Pattern/deviations: Decreased step length - right;Decreased step length - left;Decreased stride length Gait velocity: slow   General Gait Details: limited to 3-4 unsteady labored side steps, unable to support body weight due to BLE weakness  Stairs            Wheelchair Mobility    Modified Rankin (Stroke Patients Only)       Balance Overall balance assessment: Needs assistance Sitting-balance support: Feet supported;No upper extremity supported Sitting balance-Leahy Scale: Fair     Standing balance support: No upper extremity supported;During functional activity Standing balance-Leahy Scale: Poor Standing balance comment: hand held assist                             Pertinent Vitals/Pain Pain Assessment: No/denies pain    Home Living Family/patient expects to be discharged to:: Private residence Living Arrangements: Alone Available Help at Discharge:  Neighbor Type of Home: Mobile home Home Access: Level entry     Home Layout: One level Home Equipment: [place] - 2 wheels;Cane - single point;Bedside commode      Prior Function Level of Independence: Independent         Comments: community ambulator, drives      Hand Dominance   Dominant Hand: Right    Extremity/Trunk Assessment   Upper Extremity Assessment Upper Extremity Assessment: Generalized weakness    Lower Extremity Assessment Lower Extremity Assessment: Generalized weakness    Cervical / Trunk Assessment Cervical / Trunk Assessment: Normal  Communication   Communication: No difficulties  Cognition Arousal/Alertness: Awake/alert Behavior During Therapy: WFL for tasks assessed/performed Overall Cognitive Status: Within Functional Limits for tasks assessed                                        General Comments      Exercises     Assessment/Plan    PT Assessment Patient needs continued PT services  PT Problem List Decreased strength;Decreased activity tolerance;Decreased balance;Decreased mobility       PT Treatment Interventions Gait training;Stair training;Functional mobility training;Therapeutic activities;Therapeutic exercise;Patient/family education    PT Goals (Current goals can be found in the Care Plan section)  Acute Rehab PT Goals Patient Stated Goal: return home PT Goal Formulation: With patient Time For Goal Achievement: 01/15/18 Potential to Achieve Goals: Good    Frequency Min 3X/week   Barriers to discharge        Co-evaluation               AM-PAC PT "6 Clicks" Mobility  Outcome Measure Help needed turning from your back to your side while in a flat bed without using bedrails?: Total Help needed moving from lying on your back to sitting on the side of a flat bed without using bedrails?: Total Help needed moving to and from a bed to a chair (including a wheelchair)?: Total Help needed standing up from a chair using your arms (e.g., wheelchair or bedside chair)?: A Lot Help needed to walk in hospital room?: A Lot Help needed climbing 3-5 steps with a railing? : Total 6 Click Score: 8    End of Session   Activity Tolerance: Patient limited by fatigue;Treatment  limited secondary to medical complications (Comment)(Patient limited secondary to increased HR above 150 BPM with exertion) Patient left: in bed;with call bell/phone within reach Nurse Communication: Mobility status PT Visit Diagnosis: Unsteadiness on feet (R26.81);Other abnormalities of gait and mobility (R26.89);Muscle weakness (generalized) (M62.81)    Time: 1657-9038 PT Time Calculation (min) (ACUTE ONLY): 28 min   Charges:   PT Evaluation $PT Eval Moderate Complexity: 1 Mod PT Treatments $Therapeutic Activity: 23-37 mins        2:06 PM, 01/01/18 Lonell Grandchild, MPT Physical Therapist with [facility] 336 [phone] office [phone] mobile phone

## 2018-01-01 NOTE — Progress Notes (Signed)
Nutrition Follow-up (change in status-)  DOCUMENTATION CODES:   Obesity unspecified  INTERVENTION:  Dysphagia 3 diet   Ensure Enlive po BID, each supplement provides 350 kcal and 20 grams of protein   Last BM -12/2    NUTRITION DIAGNOSIS:  Inadequate oral intake related to acute respiratory failure; continues   GOAL:  Provide needs based on ASPEN/SCCM guidelines  MONITOR:  Po intake, labs and wt trends    ASSESSMENT:  Patient is a 72 yo male currently intubated (12/2 @ 2335) on ventilator support. He has a hx of DM-2, CHF, Cardiomyopathy (LVEF-15-20%), GERD, HTN and CKD. Patient presents with acute hypoxic/hypercapnic respiratory failure, acute on chronic CHF. Deep pitting edema to bilateral feet. Discussed pt with RN.   Patient extubated yesterday- and diet fully advanced. Able to feed himself.   Re-calculated estimated needs due to change in status. Urine output noted below.  Medications reviewed and include: lipitor, lasix, SSI, Protonix, KCL,    Intake/Output Summary (Last 24 hours) at 01/01/2018 1149 Last data filed at 01/01/2018 0700 Gross per 24 hour  Intake 2043.04 ml  Output 3900 ml  Net -1856.96 ml     Labs: hypokalemia BMP Latest Ref Rng & Units 01/01/2018 12/31/2017 12/30/2017  Glucose 70 - 99 mg/dL 104(H) 146(H) 151(H)  BUN 8 - 23 mg/dL 41(H) 45(H) 44(H)  Creatinine 0.61 - 1.24 mg/dL 1.82(H) 2.15(H) 2.47(H)  BUN/Creat Ratio 6 - 22 (calc) - - -  Sodium 135 - 145 mmol/L 146(H) 143 140  Potassium 3.5 - 5.1 mmol/L 3.2(L) 3.2(L) 3.7  Chloride 98 - 111 mmol/L 107 107 106  CO2 22 - 32 mmol/L 29 28 23   Calcium 8.9 - 10.3 mg/dL 7.6(L) 7.7(L) 8.1(L)     NUTRITION - FOCUSED PHYSICAL EXAM: pending   Diet Order:   Diet Order            DIET DYS 3 Room service appropriate? Yes; Fluid consistency: Thin  Diet effective now              EDUCATION NEEDS:   No education needs have been identified at this time   Skin:  Skin Assessment: Reviewed RN  Assessment-pitting edema bilateral lower extremities  Last BM:  12/2   Height:   Ht Readings from Last 1 Encounters:  12/29/17 5\' 8"  (1.727 m)    Weight:   Wt Readings from Last 1 Encounters:  01/01/18 93.9 kg    Ideal Body Weight:  70 kg  BMI:  Body mass index is 31.48 kg/m.  Estimated Nutritional Needs:   Kcal:  1103-1594 (22-24 kcal/kg/bw)  Protein:  126-140 (1.8-2.0 gr/kg/ibw)  Fluid:  per MD goals   Colman Cater MS,RD,CSG,LDN Office: [phone] Pager: [phone]

## 2018-01-01 NOTE — Plan of Care (Signed)
  Problem: Acute Rehab PT Goals(only PT should resolve) Goal: Pt Will Go Supine/Side To Sit Outcome: Progressing Flowsheets (Taken 01/01/2018 1407) Pt will go Supine/Side to Sit: with min guard assist Goal: Patient Will Transfer Sit To/From Stand Outcome: Progressing Flowsheets (Taken 01/01/2018 1407) Patient will transfer sit to/from stand: with minimal assist Goal: Pt Will Transfer Bed To Chair/Chair To Bed Outcome: Progressing Flowsheets (Taken 01/01/2018 1407) Pt will Transfer Bed to Chair/Chair to Bed: with min assist Goal: Pt Will Ambulate Outcome: Progressing Flowsheets (Taken 01/01/2018 1407) Pt will Ambulate: 25 feet; with minimal assist; with rolling walker   2:07 PM, 01/01/18 Lonell Grandchild, MPT Physical Therapist with [facility] 336 [phone] office [phone] mobile phone

## 2018-01-01 NOTE — Progress Notes (Signed)
Pharmacy Antibiotic Note  Today is day #4 of Unasyn therapy for this 47 yom with aspiration pneumonia.  Patient is afebrile and WBC count is within normal limits. Patient was extubated yesterday.  Plan: Continue Unasyn 3g IV q8h for CrClest~42 mL/min  Pharmacy will continue to monitor renal function,  cultures and patient progress.      Height: 5\' 8"  (172.7 cm) Weight: 207 lb 0.2 oz (93.9 kg) IBW/kg (Calculated) : 68.4  Temp (24hrs), Avg:98.2 F (36.8 C), Min:97.8 F (36.6 C), Max:98.4 F (36.9 C)  Recent Labs  Lab 12/28/17 0906 12/29/17 0514 12/30/17 0400 12/31/17 0415 01/01/18 0420  WBC 6.9 8.3 8.6 10.0 7.2  CREATININE 2.18* 2.57* 2.47* 2.15* 1.82*    Estimated Creatinine Clearance: 42 mL/min (A) (by C-G formula based on SCr of 1.82 mg/dL (H)).    Allergies  Allergen Reactions  . Entresto [Sacubitril-Valsartan]     BLE Edema/ Blisters     Antimicrobials this admission: Unasyn 12/3 >>    Dose adjustments this admission: n/a   Microbiology results: 12/2 MRSA PCR: negative  Thank you for allowing pharmacy to participate in  this patient's care.  Despina Pole 01/01/2018 8:50 AM

## 2018-01-01 NOTE — Progress Notes (Signed)
Subjective: He says he feels well.  He has no new complaints.  He was able to be extubated yesterday without difficulty.  No complaints of shortness of breath  Objective: Vital signs in last 24 hours: Temp:  [97.8 F (36.6 C)-98.4 F (36.9 C)] (P) 98.4 F (36.9 C) (12/06 0000) Pulse Rate:  [58-140] 91 (12/06 0700) Resp:  [11-34] 17 (12/06 0700) BP: (80-183)/(54-146) 135/80 (12/06 0700) SpO2:  [95 %-100 %] 100 % (12/06 0700) FiO2 (%):  [24 %-40 %] 24 % (12/06 0300) Weight:  [93.9 kg] 93.9 kg (12/06 0500) Weight change: -1 kg Last BM Date: 12/28/17  Intake/Output from previous day: 12/05 0701 - 12/06 0700 In: 2310.1 [I.V.:587.2; NG/GT:176; IV Piggyback:1546.9] Out: 4410 [Urine:4410]  PHYSICAL EXAM General appearance: alert, cooperative and no distress Resp: He still has basilar rales but generally much better Cardio: His heart is still in atrial fib but heart rates about 90 GI: soft, non-tender; bowel sounds normal; no masses,  no organomegaly Extremities: Still edema but better  Lab Results:  Results for orders placed or performed during the hospital encounter of 12/28/17 (from the past 48 hour(s))  Glucose, capillary     Status: Abnormal   Collection Time: 12/30/17  8:16 AM  Result Value Ref Range   Glucose-Capillary 148 (H) 70 - 99 mg/dL  Magnesium     Status: None   Collection Time: 12/30/17  8:32 AM  Result Value Ref Range   Magnesium 1.9 1.7 - 2.4 mg/dL    Comment: Performed at [facility], [address], [place], [place] [address]  Phosphorus     Status: None   Collection Time: 12/30/17  8:32 AM  Result Value Ref Range   Phosphorus 4.2 2.5 - 4.6 mg/dL    Comment: Performed at [facility], [address], [place], [place] [address]  Heparin level (unfractionated)     Status: None   Collection Time: 12/30/17 10:24 AM  Result Value Ref Range   Heparin Unfractionated 0.61 0.30 - 0.70 IU/mL    Comment: (NOTE) If heparin results are below expected values, and  patient dosage has  been confirmed, suggest follow up testing of antithrombin III levels. Performed at [facility], [address], [place], [place] [address]   APTT     Status: Abnormal   Collection Time: 12/30/17 10:24 AM  Result Value Ref Range   aPTT 178 (HH) 24 - 36 seconds    Comment:        IF BASELINE aPTT IS ELEVATED, SUGGEST PATIENT RISK ASSESSMENT BE USED TO DETERMINE APPROPRIATE ANTICOAGULANT THERAPY. REPEATED TO VERIFY CRITICAL RESULT CALLED TO, READ BACK BY AND VERIFIED WITH: ASHLEY SHELTON AT 1224 BY HFLYNT 12/30/17 Performed at [facility], [address], [place], [place] [address]   Glucose, capillary     Status: Abnormal   Collection Time: 12/30/17 11:18 AM  Result Value Ref Range   Glucose-Capillary 137 (H) 70 - 99 mg/dL  Glucose, capillary     Status: Abnormal   Collection Time: 12/30/17  4:26 PM  Result Value Ref Range   Glucose-Capillary 160 (H) 70 - 99 mg/dL  Magnesium     Status: None   Collection Time: 12/30/17  5:33 PM  Result Value Ref Range   Magnesium 1.8 1.7 - 2.4 mg/dL    Comment: Performed at [facility], [address] [place], [place] [address]  Phosphorus     Status: None   Collection Time: 12/30/17  5:33 PM  Result Value Ref Range  Phosphorus 3.8 2.5 - 4.6 mg/dL    Comment: Performed at [facility], [address] [place], [place] [address]  Glucose, capillary     Status: Abnormal   Collection Time: 12/30/17  7:38 PM  Result Value Ref Range   Glucose-Capillary 182 (H) 70 - 99 mg/dL  Glucose, capillary     Status: Abnormal   Collection Time: 12/30/17 11:41 PM  Result Value Ref Range   Glucose-Capillary 174 (H) 70 - 99 mg/dL  Glucose, capillary     Status: Abnormal   Collection Time: 12/31/17  4:14 AM  Result Value Ref Range   Glucose-Capillary 135 (H) 70 - 99 mg/dL   Comment 1 Notify RN    Comment 2 Document in Chart   CBC     Status: Abnormal   Collection Time: 12/31/17  4:15 AM  Result Value Ref Range   WBC 10.0 4.0 - 10.5  K/uL   RBC 5.40 4.22 - 5.81 MIL/uL   Hemoglobin 14.3 13.0 - 17.0 g/dL   HCT 44.5 39.0 - 52.0 %   MCV 82.4 80.0 - 100.0 fL   MCH 26.5 26.0 - 34.0 pg   MCHC 32.1 30.0 - 36.0 g/dL   RDW 16.3 (H) 11.5 - 15.5 %   Platelets 110 (L) 150 - 400 K/uL    Comment: CONSISTENT WITH PREVIOUS RESULT   nRBC 0.0 0.0 - 0.2 %    Comment: Performed at [facility], [address] [place], [place] [address]  Magnesium     Status: None   Collection Time: 12/31/17  4:15 AM  Result Value Ref Range   Magnesium 1.9 1.7 - 2.4 mg/dL    Comment: Performed at [facility], [address] [place], [place] [address]  Phosphorus     Status: None   Collection Time: 12/31/17  4:15 AM  Result Value Ref Range   Phosphorus 3.6 2.5 - 4.6 mg/dL    Comment: Performed at [facility], [address], [place], [place] [address]  Heparin level (unfractionated)     Status: None   Collection Time: 12/31/17  4:15 AM  Result Value Ref Range   Heparin Unfractionated 0.47 0.30 - 0.70 IU/mL    Comment: (NOTE) If heparin results are below expected values, and patient dosage has  been confirmed, suggest follow up testing of antithrombin III levels. Performed at [facility], [address], [place], [place] [address]   Comprehensive metabolic panel     Status: Abnormal   Collection Time: 12/31/17  4:15 AM  Result Value Ref Range   Sodium 143 135 - 145 mmol/L   Potassium 3.2 (L) 3.5 - 5.1 mmol/L   Chloride 107 98 - 111 mmol/L   CO2 28 22 - 32 mmol/L   Glucose, Bld 146 (H) 70 - 99 mg/dL   BUN 45 (H) 8 - 23 mg/dL   Creatinine, Ser 2.15 (H) 0.61 - 1.24 mg/dL   Calcium 7.7 (L) 8.9 - 10.3 mg/dL   Total Protein 4.8 (L) 6.5 - 8.1 g/dL   Albumin 2.2 (L) 3.5 - 5.0 g/dL   AST 28 15 - 41 U/L   ALT 22 0 - 44 U/L   Alkaline Phosphatase 115 38 - 126 U/L   Total Bilirubin 1.3 (H) 0.3 - 1.2 mg/dL   GFR calc non Af Amer 30 (L) >60 mL/min   GFR calc Af Amer 35 (L) >60 mL/min   Anion gap 8 5 - 15    Comment: Performed at [facility], [address], [place], [place] [address]  Cortisol-am, blood     Status: Abnormal   Collection Time: 12/31/17  4:15 AM  Result Value Ref Range   Cortisol - AM 4.7 (L) 6.7 - 22.6 ug/dL    Comment: Performed at [facility] [address], [place], [place] [address]  Glucose, capillary     Status: Abnormal   Collection Time: 12/31/17  7:27 AM  Result Value Ref Range   Glucose-Capillary 142 (H) 70 - 99 mg/dL  Blood gas, arterial     Status: Abnormal   Collection Time: 12/31/17  8:35 AM  Result Value Ref Range   FIO2 40.00    Delivery systems VENTILATOR    Mode PRESSURE REGULATED VOLUME CONTROL    VT 540 mL   LHR 20 resp/min   Peep/cpap 5.0 cm H20   pH, Arterial 7.511 (H) 7.350 - 7.450   pCO2 arterial 34.3 32.0 - 48.0 mmHg   pO2, Arterial 138 (H) 83.0 - 108.0 mmHg   Bicarbonate 28.7 (H) 20.0 - 28.0 mmol/L   Acid-Base Excess 4.1 (H) 0.0 - 2.0 mmol/L   O2 Saturation 99.5 %   Patient temperature 37.0    Collection site BRACHIAL ARTERY    Drawn by [id number]    Sample type ARTERIAL DRAW    Allens test (pass/fail) PASS PASS    Comment: Performed at [facility], [address], [place], [place] [address]  Blood gas, arterial     Status: Abnormal   Collection Time: 12/31/17 10:25 AM  Result Value Ref Range   FIO2 40.00    Delivery systems VENTILATOR    Mode CONTINUOUS POSITIVE AIRWAY PRESSURE    Peep/cpap 5.0 cm H20   Pressure support 10.0 cm H20   pH, Arterial 7.48 (H) 7.350 - 7.450   pCO2 arterial 37.5 32.0 - 48.0 mmHg   pO2, Arterial 142 (H) 83.0 - 108.0 mmHg   Bicarbonate 28.4 (H) 20.0 - 28.0 mmol/L   Acid-Base Excess 4.2 (H) 0.0 - 2.0 mmol/L   O2 Saturation 99.4 %   Patient temperature 37.0    Collection site LEFT RADIAL    Drawn by [id number]    Sample type ARTERIAL DRAW    Allens test (pass/fail) PASS PASS    Comment: Performed at [facility], [address] [place], [place] [address]  Glucose, capillary     Status: Abnormal   Collection Time: 12/31/17 11:04 AM   Result Value Ref Range   Glucose-Capillary 106 (H) 70 - 99 mg/dL  Glucose, capillary     Status: Abnormal   Collection Time: 12/31/17  5:24 PM  Result Value Ref Range   Glucose-Capillary 101 (H) 70 - 99 mg/dL  Glucose, capillary     Status: Abnormal   Collection Time: 12/31/17  8:39 PM  Result Value Ref Range   Glucose-Capillary 121 (H) 70 - 99 mg/dL  Glucose, capillary     Status: Abnormal   Collection Time: 01/01/18 12:37 AM  Result Value Ref Range   Glucose-Capillary 160 (H) 70 - 99 mg/dL  CBC     Status: Abnormal   Collection Time: 01/01/18  4:20 AM  Result Value Ref Range   WBC 7.2 4.0 - 10.5 K/uL   RBC 5.12 4.22 - 5.81 MIL/uL   Hemoglobin 13.7 13.0 - 17.0 g/dL   HCT 43.0 39.0 - 52.0 %   MCV 84.0 80.0 - 100.0 fL   MCH 26.8 26.0 - 34.0 pg   MCHC 31.9 30.0 -  36.0 g/dL   RDW 16.3 (H) 11.5 - 15.5 %   Platelets 77 (L) 150 - 400 K/uL    Comment: SPECIMEN CHECKED FOR CLOTS Immature Platelet Fraction may be clinically indicated, consider ordering this additional test [id number] CONSISTENT WITH PREVIOUS RESULT    nRBC 0.0 0.0 - 0.2 %    Comment: Performed at [facility], [address] [place], [place] [address]  Heparin level (unfractionated)     Status: None   Collection Time: 01/01/18  4:20 AM  Result Value Ref Range   Heparin Unfractionated 0.30 0.30 - 0.70 IU/mL    Comment: (NOTE) If heparin results are below expected values, and patient dosage has  been confirmed, suggest follow up testing of antithrombin III levels. Performed at [facility], [address], [place], [place] [address]   Comprehensive metabolic panel     Status: Abnormal   Collection Time: 01/01/18  4:20 AM  Result Value Ref Range   Sodium 146 (H) 135 - 145 mmol/L   Potassium 3.2 (L) 3.5 - 5.1 mmol/L   Chloride 107 98 - 111 mmol/L   CO2 29 22 - 32 mmol/L   Glucose, Bld 104 (H) 70 - 99 mg/dL   BUN 41 (H) 8 - 23 mg/dL   Creatinine, Ser 1.82 (H) 0.61 - 1.24 mg/dL   Calcium 7.6 (L) 8.9 - 10.3 mg/dL    Total Protein 5.0 (L) 6.5 - 8.1 g/dL   Albumin 2.3 (L) 3.5 - 5.0 g/dL   AST 42 (H) 15 - 41 U/L   ALT 22 0 - 44 U/L   Alkaline Phosphatase 109 38 - 126 U/L   Total Bilirubin 1.3 (H) 0.3 - 1.2 mg/dL   GFR calc non Af Amer 37 (L) >60 mL/min   GFR calc Af Amer 43 (L) >60 mL/min   Anion gap 10 5 - 15    Comment: Performed at [facility], [address], [place], [place] [address]  Glucose, capillary     Status: None   Collection Time: 01/01/18  4:25 AM  Result Value Ref Range   Glucose-Capillary 86 70 - 99 mg/dL    ABGS Recent Labs    12/31/17 1025  PHART 7.48*  PO2ART 142*  HCO3 28.4*   CULTURES Recent Results (from the past 240 hour(s))  MRSA PCR Screening     Status: None   Collection Time: 12/28/17 12:42 PM  Result Value Ref Range Status   MRSA by PCR NEGATIVE NEGATIVE Final    Comment:        The GeneXpert MRSA Assay (FDA approved for NASAL specimens only), is one component of a comprehensive MRSA colonization surveillance program. It is not intended to diagnose MRSA infection nor to guide or monitor treatment for MRSA infections. Performed at [facility], [address] [place], [place] [address]    Studies/Results: Dg Chest Port 1 View  Result Date: 12/31/2017 CLINICAL DATA:  Ventilator dependent respiratory failure. Follow-up pulmonary edema and bibasilar atelectasis and/or pneumonia. EXAM: PORTABLE CHEST 1 VIEW COMPARISON:  12/30/2017, 12/28/2017 and earlier, including CT chest 12/29/2017. FINDINGS: Endotracheal tube tip in satisfactory position projecting approximately 4 cm above the carina. Nasogastric tube courses below the diaphragm into the stomach. Interval resolution of pulmonary edema. Persistent dense consolidation in the LEFT LOWER LOBE and mild to moderate atelectasis at the RIGHT lung base. No new pulmonary parenchymal abnormalities. IMPRESSION: 1.  Support apparatus satisfactory. 2. Resolution of pulmonary edema. 3. Stable dense LEFT LOWER LOBE  atelectasis and pneumonia  and mild to moderate RIGHT basilar atelectasis. 4. No new abnormalities. Electronically Signed   By: Evangeline Dakin M.D.   On: 12/31/2017 08:42    Medications:  Prior to Admission:  Medications Prior to Admission  Medication Sig Dispense Refill Last Dose  . acetaminophen (TYLENOL) 325 MG tablet Take 650 mg by mouth every 6 (six) hours as needed for headache.   Past Week at Unknown time  . amiodarone (PACERONE) 200 MG tablet Take 1 tablet (200 mg total) by mouth daily. 90 tablet 3 12/27/2017 at Unknown time  . aspirin EC 81 MG tablet Take 81 mg by mouth daily.   12/28/2017 at Unknown time  . atorvastatin (LIPITOR) 40 MG tablet Take 1 tablet (40 mg total) by mouth daily. 90 tablet 2 12/27/2017 at Unknown time  . isosorbide-hydrALAZINE (BIDIL) 20-37.5 MG tablet Take 1 tablet by mouth 3 (three) times daily. (Patient taking differently: Take 1 tablet by mouth 2 (two) times daily. ) 90 tablet 11 12/27/2017 at Unknown time  . metoprolol succinate (TOPROL-XL) 25 MG 24 hr tablet Take 1 tablet (25 mg total) by mouth daily. 90 tablet 2 12/27/2017 at 0700am  . potassium chloride SA (K-DUR,KLOR-CON) 20 MEQ tablet Take 2 tablets (40 mEq total) by mouth daily. (Patient taking differently: Take 20 mEq by mouth 2 (two) times daily. ) 180 tablet 2 12/27/2017 at Unknown time  . torsemide (DEMADEX) 20 MG tablet Take 2 tablets (40 mg total) by mouth 2 (two) times daily. 180 tablet 2 12/27/2017 at Unknown time  . edoxaban (SAVAYSA) 60 MG TABS tablet Take 60 mg by mouth daily. (Patient not taking: Reported on 12/28/2017) 90 tablet 2 Not Taking at Unknown time   Scheduled: . aspirin  81 mg Per Tube Daily  . atorvastatin  40 mg Per Tube q1800  . chlorhexidine gluconate (MEDLINE KIT)  15 mL Mouth Rinse BID  . Chlorhexidine Gluconate Cloth  6 each Topical Daily  . furosemide  40 mg Intravenous TID  . insulin aspart  0-9 Units Subcutaneous TID WC  . ipratropium  0.5 mg Nebulization Q6H  .  levalbuterol  0.63 mg Nebulization Q6H  . mouth rinse  15 mL Mouth Rinse 10 times per day  . metoprolol tartrate  12.5 mg Oral BID  . pantoprazole (PROTONIX) IV  40 mg Intravenous Q24H  . potassium chloride  20 mEq Per Tube Daily  . sodium chloride flush  10-40 mL Intracatheter Q12H  . sodium chloride flush  3 mL Intravenous Q12H   Continuous: . sodium chloride Stopped (12/31/17 0001)  . amiodarone 30 mg/hr (01/01/18 0300)  . ampicillin-sulbactam (UNASYN) IV 3 g (01/01/18 0744)  . heparin 900 Units/hr (01/01/18 0700)  . norepinephrine (LEVOPHED) Adult infusion Stopped (12/31/17 0750)  . phenylephrine (NEO-SYNEPHRINE) Adult infusion Stopped (12/30/17 0906)  . propofol (DIPRIVAN) infusion Stopped (12/31/17 0917)   CHE:NIDPOE chloride, acetaminophen, fentaNYL (SUBLIMAZE) injection, fentaNYL (SUBLIMAZE) injection, morphine injection, ondansetron (ZOFRAN) IV, sodium chloride flush, sodium chloride flush  Assesment: He was admitted with acute on chronic systolic heart failure and this culminated in him being intubated and placed on mechanical ventilation.  He was intubated on 12/28/2017 extubated on 12/31/2017 and he is doing well.  He does not have lung disease at baseline.  His breathing is doing okay.  He has multiple other medical problems including atrial fib with RVR heart failure, chronic kidney disease managed by his primary and cardiology.  Cortrosyn stimulation test was ordered but he was given ACTH at around 5:00 this  morning.  I asked the lab to add on baseline cortisol level to blood they already have in the lab this morning but it is too late now to draw the blood for the stimulation test so he may need another dose of ACTH tomorrow to allow [place] to time the cortisol levels Principal Problem:   Acute on chronic systolic HF (heart failure) (HCC) Active Problems:   Hyperlipidemia   HTN (hypertension)   Gastroesophageal reflux disease   Type 2 diabetes mellitus with diabetic  nephropathy, without long-term current use of insulin (HCC)   Atrial fibrillation with rapid ventricular response (HCC)   Acute on chronic combined systolic and diastolic CHF (congestive heart failure) (HCC)   Acute respiratory failure (HCC)   Stage 3 chronic kidney disease (HCC)   Hypotension   Acute respiratory failure with hypoxia and hypercarbia (HCC)   Acute renal failure superimposed on stage 3 chronic kidney disease ([place])    Plan: I will plan to sign off.  Thanks for allowing me to see him with you    LOS: 4 days   Tranae Laramie L 01/01/2018, 7:59 AM
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
## 2018-01-01 NOTE — Progress Notes (Signed)
Progress Note  Patient Name: Phillip Wilson Date of Encounter: 01/01/2018  Primary Cardiologist: Dr. Kate Sable  Subjective   Patient awake, eating breakfast, extubated yesterday.  Still has significant leg edema.  Inpatient Medications    Scheduled Meds: . aspirin  81 mg Per Tube Daily  . atorvastatin  40 mg Per Tube q1800  . chlorhexidine gluconate (MEDLINE KIT)  15 mL Mouth Rinse BID  . Chlorhexidine Gluconate Cloth  6 each Topical Daily  . furosemide  40 mg Intravenous TID  . insulin aspart  0-9 Units Subcutaneous TID WC  . ipratropium  0.5 mg Nebulization Q6H  . levalbuterol  0.63 mg Nebulization Q6H  . mouth rinse  15 mL Mouth Rinse 10 times per day  . metoprolol tartrate  12.5 mg Oral BID  . pantoprazole (PROTONIX) IV  40 mg Intravenous Q24H  . potassium chloride  20 mEq Per Tube Daily  . sodium chloride flush  10-40 mL Intracatheter Q12H  . sodium chloride flush  3 mL Intravenous Q12H   Continuous Infusions: . sodium chloride Stopped (12/31/17 0001)  . amiodarone 30 mg/hr (01/01/18 0300)  . ampicillin-sulbactam (UNASYN) IV 3 g (01/01/18 0744)  . heparin 900 Units/hr (01/01/18 0700)  . norepinephrine (LEVOPHED) Adult infusion Stopped (12/31/17 0750)  . phenylephrine (NEO-SYNEPHRINE) Adult infusion Stopped (12/30/17 0906)  . propofol (DIPRIVAN) infusion Stopped (12/31/17 0917)   PRN Meds: sodium chloride, acetaminophen, fentaNYL (SUBLIMAZE) injection, fentaNYL (SUBLIMAZE) injection, morphine injection, ondansetron (ZOFRAN) IV, sodium chloride flush, sodium chloride flush   Vital Signs    Vitals:   01/01/18 0400 01/01/18 0500 01/01/18 0600 01/01/18 0700  BP: 107/76  (!) 183/146 135/80  Pulse: 81 86 91 91  Resp: 11 (!) 34 15 17  Temp:      TempSrc:      SpO2: 100% 100% 99% 100%  Weight:  93.9 kg    Height:        Intake/Output Summary (Last 24 hours) at 01/01/2018 0921 Last data filed at 01/01/2018 0700 Gross per 24 hour  Intake 2219.04 ml    Output 4210 ml  Net -1990.96 ml   Filed Weights   12/28/17 0844 12/31/17 0427 01/01/18 0500  Weight: 93 kg 94.9 kg 93.9 kg    Telemetry    Atrial fibrillation with RVR.  Personally reviewed.  Physical Exam   GEN: No acute distress.   Neck: No JVD. Cardiac:  Irregularly irregular, no gallop.  Respiratory: Nonlabored.  Basilar crackles. GI: Soft, nontender, bowel sounds present. MS: 2-3+ leg edema; No deformity. Neuro:  Nonfocal. Psych: Alert and oriented x 3. Somewhat flat affect.  Labs    Chemistry Recent Labs  Lab 12/29/17 0514 12/30/17 0400 12/31/17 0415 01/01/18 0420  NA 139 140 143 146*  K 4.2 3.7 3.2* 3.2*  CL 103 106 107 107  CO2 25 23 28 29   GLUCOSE 195* 151* 146* 104*  BUN 46* 44* 45* 41*  CREATININE 2.57* 2.47* 2.15* 1.82*  CALCIUM 8.2* 8.1* 7.7* 7.6*  PROT 6.8  --  4.8* 5.0*  ALBUMIN 3.3*  --  2.2* 2.3*  AST 94*  --  28 42*  ALT 40  --  22 22  ALKPHOS 197*  --  115 109  BILITOT 2.3*  --  1.3* 1.3*  GFRNONAA 24* 25* 30* 37*  GFRAA 28* 29* 35* 43*  ANIONGAP 11 11 8 10      Hematology Recent Labs  Lab 12/30/17 0400 12/31/17 0415 01/01/18 0420  WBC 8.6 10.0  7.2  RBC 5.31 5.40 5.12  HGB 14.1 14.3 13.7  HCT 43.7 44.5 43.0  MCV 82.3 82.4 84.0  MCH 26.6 26.5 26.8  MCHC 32.3 32.1 31.9  RDW 16.7* 16.3* 16.3*  PLT 117* 110* 77*    Cardiac Enzymes Recent Labs  Lab 12/28/17 0906 12/28/17 1812 12/28/17 2319 12/29/17 0514  TROPONINI 0.08* 0.08* 0.08* 0.09*   No results for input(s): TROPIPOC in the last 168 hours.   BNP Recent Labs  Lab 12/28/17 0906  BNP 3,321.0*     Radiology    Dg Chest Port 1 View  Result Date: 12/31/2017 CLINICAL DATA:  Ventilator dependent respiratory failure. Follow-up pulmonary edema and bibasilar atelectasis and/or pneumonia. EXAM: PORTABLE CHEST 1 VIEW COMPARISON:  12/30/2017, 12/28/2017 and earlier, including CT chest 12/29/2017. FINDINGS: Endotracheal tube tip in satisfactory position projecting  approximately 4 cm above the carina. Nasogastric tube courses below the diaphragm into the stomach. Interval resolution of pulmonary edema. Persistent dense consolidation in the LEFT LOWER LOBE and mild to moderate atelectasis at the RIGHT lung base. No new pulmonary parenchymal abnormalities. IMPRESSION: 1.  Support apparatus satisfactory. 2. Resolution of pulmonary edema. 3. Stable dense LEFT LOWER LOBE atelectasis and pneumonia and mild to moderate RIGHT basilar atelectasis. 4. No new abnormalities. Electronically Signed   By: Evangeline Dakin M.D.   On: 12/31/2017 08:42    Cardiac Studies   Echocardiogram 12/29/2017: Study Conclusions  - Left ventricle: The cavity size was normal. Wall thickness was   increased in a pattern of mild LVH. Systolic function was   severely reduced. The estimated ejection fraction was in the   range of 15% to 20%. Diffuse hypokinesis. The study was not   technically sufficient to allow evaluation of LV diastolic   dysfunction due to atrial fibrillation. - Aortic valve: Mildly calcified annulus. Trileaflet. - Mitral valve: Mildly calcified annulus. There was mild   regurgitation. - Right ventricle: The cavity size was moderately dilated. Systolic   function was severely reduced. - Right atrium: The atrium was mildly dilated. - Tricuspid valve: There was mild regurgitation. Peak RV-RA   gradient (S): 36 mm Hg. - Pulmonary arteries: Systolic pressure could not be accurately   estimated. - Inferior vena cava: The vessel was dilated. Patient on   ventilator. - Pericardium, extracardiac: A small pericardial effusion was   identified posterior to the heart.   Patient Profile     72 y.o. male with a history of nonischemic cardiomyopathy, paroxysmal to persistent atrial fibrillation, hypertension, type 2 diabetes mellitus, CKD stage III, and hyperlipidemiawho is being seen for the evaluation ofrapid atrial fibrillation and acute on chronic systolic heart  failure.  Assessment & Plan    1.  Acute on chronic systolic heart failure.  He is diuresing well on IV Lasix, still remains volume overloaded however.  Net output of approximately 2100 cc last 24 hours.  Renal function improving.  2.  Acute hypoxic and hypercapnic respiratory failure, extubated yesterday with follow-up per Dr. Luan Pulling.  Chest x-ray from yesterday shows left lower lobe atelectasis and possible infiltrate with mild to moderate right basilar atelectasis.  3.  Persistent atrial fibrillation.  He has been on IV heparin most recently.  On [facility] is an outpatient.  Remains on IV amiodarone for heart rate control.  4.  Ischemic cardiomyopathy with LVEF 15 to 20% range.  5.  Minor increase in troponin I with flat pattern not consistent with ACS.  6.  Acute on chronic renal insufficiency with CKD  stage 3.  Creatinine is down to 1.82.  Continue aspirin, Lipitor, change Lopressor to Toprol-XL, and continue IV Lasix with good diuresis and improving renal function.  He is not ready for discharge with further volume removal needed.  Would not add ARB with recent renal insufficiency and CKD stage 3.  Advance beta-blocker and ultimately discontinue IV amiodarone when heart rate is better optimized.  He can also be transitioned from IV heparin back to oral Savaysa once central venous access has been removed.  Signed, Rozann Lesches, MD  01/01/2018, 9:21 AM

## 2018-01-01 NOTE — Care Management (Signed)
PT notes sent to SNF and CM requested auth be started.

## 2018-01-01 NOTE — Progress Notes (Addendum)
[facility] for Heparin--->transition to oral edoxaban Indication: atrial fibrillation  Allergies  Allergen Reactions  . Entresto [Sacubitril-Valsartan]     BLE Edema/ Blisters     Patient Measurements: Height: 5\' 8"  (172.7 cm) Weight: 207 lb 0.2 oz (93.9 kg) IBW/kg (Calculated) : 68.4 HEPARIN DW (KG): 87.7  Vital Signs: Temp: (P) 98.4 F (36.9 C) (12/06 0000) BP: 135/80 (12/06 0700) Pulse Rate: 91 (12/06 0700)  Labs: Recent Labs    12/29/17 1113 12/30/17 0107  12/30/17 0400 12/30/17 1024 12/31/17 0415 01/01/18 0420  HGB  --   --    < > 14.1  --  14.3 13.7  HCT  --   --   --  43.7  --  44.5 43.0  PLT  --   --   --  117*  --  110* 77*  APTT 40* 189*  --   --  178*  --   --   HEPARINUNFRC 1.32* 0.94*  --   --  0.61 0.47 0.30  CREATININE  --   --   --  2.47*  --  2.15* 1.82*   < > = values in this interval not displayed.    Estimated Creatinine Clearance: 42 mL/min (A) (by C-G formula based on SCr of 1.82 mg/dL (H)).    Assessment: 72 y.o. male with a history of nonischemic cardiomyopathy, paroxysmal to persistent atrial fibrillation, hypertension, type 2 diabetes mellitus, CKD stage III, and hyperlipidemia who was admitted with rapid atrial fibrillation and acute on chronic systolic heart failure.   Heparin level of 0.3 IU/mL today is at low end of therapeutic range. Platelets have dropped by 60K since 12/3.    Plan: re-start edoxaban 30mg  daily for CrClest~42 mL/min Continue heparin at 900 units/hr until edoxaban arrives from [facility]  Continue to monitor H&H and platelets  Despina Pole, Pharm. D. Clinical Pharmacist 01/01/2018 8:45 AM

## 2018-01-01 NOTE — Clinical Social Work Note (Signed)
Clinical Social Work Assessment  Patient Details  Name: Phillip Wilson MRN: [id number] Date of Birth: 25-Jul-1945  Date of referral:  01/01/18               Reason for consult:  Facility Placement, Discharge Planning                Permission sought to share information with:  Chartered certified accountant granted to share information::  Yes, Verbal Permission Granted  Name::        Agency::  [facility], [facility]  Relationship::     Contact Information:     Housing/Transportation Living arrangements for the past 2 months:  [place] of Information:  Patient Patient Interpreter Needed:  None Criminal Activity/Legal Involvement Pertinent to Current Situation/Hospitalization:  No - Comment as needed Significant Relationships:    Lives with:    Do you feel safe going back to the place where you live?  Yes Need for family participation in patient care:  No (Coment)  Care giving concerns: PT recommending SNF rehab.    Social Worker assessment / plan: Pt is a 72 year old male referred to CSW for SNF rehab placement. Met with pt and family to assess. Pt works full time and is normally independent in ADLs. Provided CMS SNF list to pt. He is agreeable to SNF. Will refer to pt's requested facilities and will follow. Pt will need insurance authorization before he can dc. MD indicated pt would remain hospitalized through the weekend.  Employment status:  [employer] information:  Other (Comment Required)([employer]) PT Recommendations:  [place] / Referral to community resources:  [place]  Patient/Family's Response to care: Pt accepting of care.  Patient/Family's Understanding of and Emotional Response to Diagnosis, Current Treatment, and Prognosis: Pt appears to have a good understanding of diagnosis and treatment recommendations. Pt asks appropriate questions and responds appropriately. Pt  presents with flat affect. Emotional support provided.  Emotional Assessment Appearance:  Appears stated age Attitude/Demeanor/Rapport:  Engaged Affect (typically observed):  Pleasant, Flat Orientation:  Oriented to Self, Oriented to Place, Oriented to  Time, Oriented to Situation Alcohol / Substance use:  Not Applicable Psych involvement (Current and /or in the community):  No (Comment)  Discharge Needs  Concerns to be addressed:  Discharge Planning Concerns Readmission within the last 30 days:  No Current discharge risk:  Physical Impairment Barriers to Discharge:  [place], LCSW 01/01/2018, 12:43 PM

## 2018-01-02 LAB — BASIC METABOLIC PANEL
Anion gap: 8 (ref 5–15)
BUN: 30 mg/dL — ABNORMAL HIGH (ref 8–23)
CO2: 29 mmol/L (ref 22–32)
CREATININE: 1.56 mg/dL — AB (ref 0.61–1.24)
Calcium: 7.7 mg/dL — ABNORMAL LOW (ref 8.9–10.3)
Chloride: 107 mmol/L (ref 98–111)
GFR calc Af Amer: 51 mL/min — ABNORMAL LOW (ref >60)
GFR calc non Af Amer: 44 mL/min — ABNORMAL LOW (ref >60)
Glucose, Bld: 139 mg/dL — ABNORMAL HIGH (ref 70–99)
Potassium: 3.2 mmol/L — ABNORMAL LOW (ref 3.5–5.1)
Sodium: 144 mmol/L (ref 135–145)

## 2018-01-02 LAB — CBC
HCT: 46.1 % (ref 39.0–52.0)
Hemoglobin: 14.4 g/dL (ref 13.0–17.0)
MCH: 26.6 pg (ref 26.0–34.0)
MCHC: 31.2 g/dL (ref 30.0–36.0)
MCV: 85.1 fL (ref 80.0–100.0)
Platelets: 81 10*3/uL — ABNORMAL LOW (ref 150–400)
RBC: 5.42 MIL/uL (ref 4.22–5.81)
RDW: 16.3 % — ABNORMAL HIGH (ref 11.5–15.5)
WBC: 10.2 10*3/uL (ref 4.0–10.5)
nRBC: 0 % (ref 0.0–0.2)

## 2018-01-02 LAB — GLUCOSE, CAPILLARY
Glucose-Capillary: 127 mg/dL — ABNORMAL HIGH (ref 70–99)
Glucose-Capillary: 144 mg/dL — ABNORMAL HIGH (ref 70–99)
Glucose-Capillary: 107 mg/dL — ABNORMAL HIGH (ref 70–99)
Glucose-Capillary: 157 mg/dL — ABNORMAL HIGH (ref 70–99)

## 2018-01-02 LAB — ACTH: C206 ACTH: 16 pg/mL (ref 7.2–63.3)

## 2018-01-02 MED ORDER — COSYNTROPIN 0.25 MG IJ SOLR
0.2500 mg | Freq: Once | INTRAMUSCULAR | Status: AC
  Administered 2018-01-03: 0.25 mg via INTRAVENOUS
  Filled 2018-01-02: qty 0.25

## 2018-01-02 MED ORDER — POTASSIUM CHLORIDE CRYS ER 20 MEQ PO TBCR
40.0000 meq | EXTENDED_RELEASE_TABLET | Freq: Every day | ORAL | Status: DC
  Administered 2018-01-03 – 2018-01-12 (×10): 40 meq via ORAL
  Filled 2018-01-02 (×10): qty 2

## 2018-01-02 MED ORDER — METOPROLOL SUCCINATE ER 25 MG PO TB24
25.0000 mg | ORAL_TABLET | Freq: Once | ORAL | Status: AC
  Administered 2018-01-02: 25 mg via ORAL
  Filled 2018-01-02: qty 1

## 2018-01-02 MED ORDER — POTASSIUM CHLORIDE CRYS ER 20 MEQ PO TBCR
20.0000 meq | EXTENDED_RELEASE_TABLET | Freq: Once | ORAL | Status: AC
  Administered 2018-01-02: 20 meq via ORAL
  Filled 2018-01-02: qty 1

## 2018-01-02 MED ORDER — METOPROLOL SUCCINATE ER 50 MG PO TB24
50.0000 mg | ORAL_TABLET | Freq: Every day | ORAL | Status: DC
  Administered 2018-01-03 – 2018-01-05 (×3): 50 mg via ORAL
  Filled 2018-01-02 (×3): qty 1

## 2018-01-02 MED ORDER — PANTOPRAZOLE SODIUM 40 MG PO TBEC
40.0000 mg | DELAYED_RELEASE_TABLET | Freq: Every day | ORAL | Status: DC
  Administered 2018-01-03 – 2018-01-13 (×11): 40 mg via ORAL
  Filled 2018-01-02 (×11): qty 1

## 2018-01-02 MED ORDER — AMOXICILLIN-POT CLAVULANATE 875-125 MG PO TABS
1.0000 | ORAL_TABLET | Freq: Two times a day (BID) | ORAL | Status: DC
  Administered 2018-01-02 – 2018-01-05 (×6): 1 via ORAL
  Filled 2018-01-02 (×6): qty 1

## 2018-01-02 MED ORDER — SALINE SPRAY 0.65 % NA SOLN
1.0000 | NASAL | Status: DC | PRN
  Administered 2018-01-02: 1 via NASAL
  Filled 2018-01-02: qty 44

## 2018-01-02 MED ORDER — METOPROLOL TARTRATE 5 MG/5ML IV SOLN
5.0000 mg | Freq: Once | INTRAVENOUS | Status: AC
  Administered 2018-01-02: 5 mg via INTRAVENOUS
  Filled 2018-01-02: qty 5
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
## 2018-01-03 LAB — BASIC METABOLIC PANEL
Anion gap: 10 (ref 5–15)
BUN: 24 mg/dL — ABNORMAL HIGH (ref 8–23)
CO2: 31 mmol/L (ref 22–32)
CREATININE: 1.53 mg/dL — AB (ref 0.61–1.24)
Calcium: 8 mg/dL — ABNORMAL LOW (ref 8.9–10.3)
Chloride: 102 mmol/L (ref 98–111)
GFR calc Af Amer: 53 mL/min — ABNORMAL LOW (ref >60)
GFR calc non Af Amer: 45 mL/min — ABNORMAL LOW (ref >60)
Glucose, Bld: 110 mg/dL — ABNORMAL HIGH (ref 70–99)
Potassium: 2.9 mmol/L — ABNORMAL LOW (ref 3.5–5.1)
Sodium: 143 mmol/L (ref 135–145)

## 2018-01-03 LAB — CBC
HCT: 49.4 % (ref 39.0–52.0)
Hemoglobin: 15.3 g/dL (ref 13.0–17.0)
MCH: 26.8 pg (ref 26.0–34.0)
MCHC: 31 g/dL (ref 30.0–36.0)
MCV: 86.7 fL (ref 80.0–100.0)
Platelets: 74 10*3/uL — ABNORMAL LOW (ref 150–400)
RBC: 5.7 MIL/uL (ref 4.22–5.81)
RDW: 17.2 % — AB (ref 11.5–15.5)
WBC: 11.1 10*3/uL — ABNORMAL HIGH (ref 4.0–10.5)
nRBC: 0 % (ref 0.0–0.2)

## 2018-01-03 LAB — GLUCOSE, CAPILLARY
Glucose-Capillary: 116 mg/dL — ABNORMAL HIGH (ref 70–99)
Glucose-Capillary: 143 mg/dL — ABNORMAL HIGH (ref 70–99)
Glucose-Capillary: 138 mg/dL — ABNORMAL HIGH (ref 70–99)
Glucose-Capillary: 128 mg/dL — ABNORMAL HIGH (ref 70–99)

## 2018-01-03 LAB — ACTH STIMULATION, 3 TIME POINTS
Cortisol, 30 Min: 36.8 ug/dL
Cortisol, 60 Min: 44.2 ug/dL
Cortisol, Base: 33.7 ug/dL

## 2018-01-03 LAB — URINE CULTURE: Culture: NO GROWTH

## 2018-01-03 MED ORDER — POTASSIUM CHLORIDE CRYS ER 20 MEQ PO TBCR
40.0000 meq | EXTENDED_RELEASE_TABLET | Freq: Once | ORAL | Status: AC
  Administered 2018-01-03: 40 meq via ORAL
  Filled 2018-01-03: qty 2

## 2018-01-03 MED ORDER — ATORVASTATIN CALCIUM 40 MG PO TABS
40.0000 mg | ORAL_TABLET | Freq: Every day | ORAL | Status: DC
  Administered 2018-01-04 – 2018-01-12 (×9): 40 mg via ORAL
  Filled 2018-01-03 (×10): qty 1

## 2018-01-03 MED ORDER — ACETAMINOPHEN 325 MG PO TABS: 650.0000 mg | ORAL_TABLET | ORAL | Status: DC | PRN

## 2018-01-03 MED ORDER — ASPIRIN 81 MG PO CHEW
81.0000 mg | CHEWABLE_TABLET | Freq: Every day | ORAL | Status: DC
  Administered 2018-01-04 – 2018-01-13 (×10): 81 mg via ORAL
  Filled 2018-01-03 (×10): qty 1

## 2018-01-03 MED ORDER — METOPROLOL TARTRATE 5 MG/5ML IV SOLN
2.5000 mg | Freq: Once | INTRAVENOUS | Status: AC
  Administered 2018-01-03: 2.5 mg via INTRAVENOUS
  Filled 2018-01-03: qty 5
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
## 2018-01-04 ENCOUNTER — Inpatient Hospital Stay (HOSPITAL_COMMUNITY): Payer: BLUE CROSS/BLUE SHIELD

## 2018-01-04 DIAGNOSIS — I519 Heart disease, unspecified: Secondary | ICD-10-CM

## 2018-01-04 DIAGNOSIS — I5043 Acute on chronic combined systolic (congestive) and diastolic (congestive) heart failure: Secondary | ICD-10-CM

## 2018-01-04 DIAGNOSIS — J9601 Acute respiratory failure with hypoxia: Secondary | ICD-10-CM

## 2018-01-04 DIAGNOSIS — E1121 Type 2 diabetes mellitus with diabetic nephropathy: Secondary | ICD-10-CM

## 2018-01-04 DIAGNOSIS — R7989 Other specified abnormal findings of blood chemistry: Secondary | ICD-10-CM

## 2018-01-04 DIAGNOSIS — I9589 Other hypotension: Secondary | ICD-10-CM

## 2018-01-04 DIAGNOSIS — I4891 Unspecified atrial fibrillation: Secondary | ICD-10-CM

## 2018-01-04 DIAGNOSIS — J9602 Acute respiratory failure with hypercapnia: Secondary | ICD-10-CM

## 2018-01-04 LAB — CBC
HCT: 44.6 % (ref 39.0–52.0)
Hemoglobin: 13.9 g/dL (ref 13.0–17.0)
MCH: 26 pg (ref 26.0–34.0)
MCHC: 31.2 g/dL (ref 30.0–36.0)
MCV: 83.4 fL (ref 80.0–100.0)
NRBC: 0 % (ref 0.0–0.2)
Platelets: 70 10*3/uL — ABNORMAL LOW (ref 150–400)
RBC: 5.35 MIL/uL (ref 4.22–5.81)
RDW: 16.3 % — ABNORMAL HIGH (ref 11.5–15.5)
WBC: 12.6 10*3/uL — ABNORMAL HIGH (ref 4.0–10.5)

## 2018-01-04 LAB — HEPATIC FUNCTION PANEL
ALT: 17 U/L (ref 0–44)
AST: 24 U/L (ref 15–41)
Albumin: 2.2 g/dL — ABNORMAL LOW (ref 3.5–5.0)
Alkaline Phosphatase: 102 U/L (ref 38–126)
BILIRUBIN INDIRECT: 0.8 mg/dL (ref 0.3–0.9)
Bilirubin, Direct: 0.5 mg/dL — ABNORMAL HIGH (ref 0.0–0.2)
Total Bilirubin: 1.3 mg/dL — ABNORMAL HIGH (ref 0.3–1.2)
Total Protein: 5.4 g/dL — ABNORMAL LOW (ref 6.5–8.1)

## 2018-01-04 LAB — BASIC METABOLIC PANEL
Anion gap: 10 (ref 5–15)
BUN: 25 mg/dL — ABNORMAL HIGH (ref 8–23)
CO2: 30 mmol/L (ref 22–32)
Calcium: 8 mg/dL — ABNORMAL LOW (ref 8.9–10.3)
Chloride: 103 mmol/L (ref 98–111)
Creatinine, Ser: 1.53 mg/dL — ABNORMAL HIGH (ref 0.61–1.24)
GFR calc Af Amer: 53 mL/min — ABNORMAL LOW (ref >60)
GFR, EST NON AFRICAN AMERICAN: 45 mL/min — AB (ref >60)
Glucose, Bld: 123 mg/dL — ABNORMAL HIGH (ref 70–99)
Potassium: 3.4 mmol/L — ABNORMAL LOW (ref 3.5–5.1)
SODIUM: 143 mmol/L (ref 135–145)

## 2018-01-04 LAB — GLUCOSE, CAPILLARY
GLUCOSE-CAPILLARY: 114 mg/dL — AB (ref 70–99)
Glucose-Capillary: 116 mg/dL — ABNORMAL HIGH (ref 70–99)
Glucose-Capillary: 128 mg/dL — ABNORMAL HIGH (ref 70–99)
Glucose-Capillary: 123 mg/dL — ABNORMAL HIGH (ref 70–99)

## 2018-01-04 LAB — URINALYSIS, COMPLETE (UACMP) WITH MICROSCOPIC

## 2018-01-04 MED ORDER — AMIODARONE IV BOLUS ONLY 150 MG/100ML
150.0000 mg | Freq: Once | INTRAVENOUS | Status: AC
  Administered 2018-01-04: 150 mg via INTRAVENOUS
  Filled 2018-01-04: qty 100

## 2018-01-04 MED ORDER — TORSEMIDE 20 MG PO TABS
40.0000 mg | ORAL_TABLET | Freq: Two times a day (BID) | ORAL | Status: DC
  Administered 2018-01-04 – 2018-01-06 (×4): 40 mg via ORAL
  Filled 2018-01-04 (×4): qty 2

## 2018-01-04 MED ORDER — SODIUM CHLORIDE 0.9% FLUSH: 10.0000 mL | INTRAVENOUS | Status: DC | PRN

## 2018-01-04 MED ORDER — CHLORHEXIDINE GLUCONATE CLOTH 2 % EX PADS
6.0000 | MEDICATED_PAD | Freq: Every day | CUTANEOUS | Status: DC
  Administered 2018-01-04 – 2018-01-11 (×8): 6 via TOPICAL

## 2018-01-04 MED ORDER — AMIODARONE HCL 150 MG/3ML IV SOLN: 150.0000 mg | Freq: Once | INTRAVENOUS | Status: DC

## 2018-01-04 MED ORDER — SODIUM CHLORIDE 0.9% FLUSH
10.0000 mL | Freq: Two times a day (BID) | INTRAVENOUS | Status: DC
  Administered 2018-01-04 – 2018-01-07 (×6): 10 mL
  Administered 2018-01-08: 20 mL
  Administered 2018-01-09 – 2018-01-11 (×5): 10 mL

## 2018-01-04 NOTE — Progress Notes (Addendum)
Progress Note  Patient Name: Phillip Wilson Date of Encounter: 01/04/2018  Primary Cardiologist: Kate Sable, MD    Subjective   Feels fine, says he's going to [facility] from here  Inpatient Medications    Scheduled Meds: . amoxicillin-clavulanate  1 tablet Oral Q12H  . aspirin  81 mg Oral Daily  . atorvastatin  40 mg Oral q1800  . edoxaban  30 mg Oral Q24H  . furosemide  40 mg Intravenous TID  . insulin aspart  0-9 Units Subcutaneous TID WC  . metoprolol succinate  50 mg Oral Daily  . pantoprazole  40 mg Oral Daily  . potassium chloride  40 mEq Oral Daily  . sodium chloride flush  3 mL Intravenous Q12H   Continuous Infusions: . sodium chloride 250 mL (01/03/18 1136)  . amiodarone 30 mg/hr (01/04/18 0634)  . norepinephrine (LEVOPHED) Adult infusion Stopped (12/31/17 0750)  . phenylephrine (NEO-SYNEPHRINE) Adult infusion Stopped (12/30/17 0906)   PRN Meds: sodium chloride, acetaminophen, fentaNYL (SUBLIMAZE) injection, fentaNYL (SUBLIMAZE) injection, levalbuterol, morphine injection, ondansetron (ZOFRAN) IV, sodium chloride, sodium chloride flush   Vital Signs    Vitals:   01/04/18 0700 01/04/18 0730 01/04/18 0738 01/04/18 0801  BP: (!) 102/91 110/78    Pulse: (!) 106 (!) 115 (!) 117   Resp: (!) 21 20 16    Temp:    98.4 F (36.9 C)  TempSrc:    Axillary  SpO2: 97% 93% 93%   Weight:      Height:        Intake/Output Summary (Last 24 hours) at 01/04/2018 1014 Last data filed at 01/04/2018 0300 Gross per 24 hour  Intake 3 ml  Output 600 ml  Net -597 ml   Filed Weights   01/01/18 0500 01/03/18 0621 01/04/18 0605  Weight: 93.9 kg 88.2 kg 88 kg    Telemetry    Afib at 115-125/m - Personally Reviewed  ECG       Physical Exam    GEN: No acute distress.   Neck: No JVD Cardiac: irreg irreg at 125/m Respiratory: Clear to auscultation bilaterally. GI: Soft, nontender, non-distended  MS: No edema; No deformity. Neuro:  Nonfocal  Psych:  Normal affect   Labs    Chemistry Recent Labs  Lab 12/31/17 0415 01/01/18 0420 01/02/18 1019 01/03/18 0431 01/04/18 0435  NA 143 146* 144 143 143  K 3.2* 3.2* 3.2* 2.9* 3.4*  CL 107 107 107 102 103  CO2 28 29 29 31 30   GLUCOSE 146* 104* 139* 110* 123*  BUN 45* 41* 30* 24* 25*  CREATININE 2.15* 1.82* 1.56* 1.53* 1.53*  CALCIUM 7.7* 7.6* 7.7* 8.0* 8.0*  PROT 4.8* 5.0*  --   --  5.4*  ALBUMIN 2.2* 2.3*  --   --  2.2*  AST 28 42*  --   --  24  ALT 22 22  --   --  17  ALKPHOS 115 109  --   --  102  BILITOT 1.3* 1.3*  --   --  1.3*  GFRNONAA 30* 37* 44* 45* 45*  GFRAA 35* 43* 51* 53* 53*  ANIONGAP 8 10 8 10 10      Hematology Recent Labs  Lab 01/02/18 1019 01/03/18 0431 01/04/18 0435  WBC 10.2 11.1* 12.6*  RBC 5.42 5.70 5.35  HGB 14.4 15.3 13.9  HCT 46.1 49.4 44.6  MCV 85.1 86.7 83.4  MCH 26.6 26.8 26.0  MCHC 31.2 31.0 31.2  RDW 16.3* 17.2* 16.3*  PLT 81*  74* 70*    Cardiac Enzymes Recent Labs  Lab 12/28/17 1812 12/28/17 2319 12/29/17 0514  TROPONINI 0.08* 0.08* 0.09*   No results for input(s): TROPIPOC in the last 168 hours.   BNPNo results for input(s): BNP, PROBNP in the last 168 hours.   DDimer No results for input(s): DDIMER in the last 168 hours.   Radiology    No results found.  Cardiac Studies    Echocardiogram 12/29/2017: Study Conclusions   - Left ventricle: The cavity size was normal. Wall thickness was   increased in a pattern of mild LVH. Systolic function was   severely reduced. The estimated ejection fraction was in the   range of 15% to 20%. Diffuse hypokinesis. The study was not   technically sufficient to allow evaluation of LV diastolic   dysfunction due to atrial fibrillation. - Aortic valve: Mildly calcified annulus. Trileaflet. - Mitral valve: Mildly calcified annulus. There was mild   regurgitation. - Right ventricle: The cavity size was moderately dilated. Systolic   function was severely reduced. - Right atrium: The  atrium was mildly dilated. - Tricuspid valve: There was mild regurgitation. Peak RV-RA   gradient (S): 36 mm Hg. - Pulmonary arteries: Systolic pressure could not be accurately   estimated. - Inferior vena cava: The vessel was dilated. Patient on   ventilator. - Pericardium, extracardiac: A small pericardial effusion was   identified posterior to the heart.     Patient Profile      72 y.o. male with a history of nonischemic cardiomyopathy EF 15-20%, paroxysmal to persistent atrial fibrillation, hypertension, type 2 diabetes mellitus, CKD stage III, and hyperlipidemia who is being seen for the evaluation of rapid atrial fibrillation and acute on chronic systolic heart failure.     Assessment & Plan       Acute on chronic systolic heart failure.  He is diuresing well on IV Lasix 40 mg TID, negative 8.4 L since admission. Negative 1.2L overnight. Was on Demadex 40 mg BID PTA. Can try to transition over to po demadex.  2.  Acute hypoxic and hypercapnic respiratory failure requiring intubation, now extubated   3.  Persistent atrial fibrillation.  He has been on Savaysa.  Remains on IV amiodarone for heart rate control but rates still 115-125/m. Also on Toprol XL 50 mg daily. PTA he was on Amio 200 mg daily and toprol 25 daily. H&P says he ran out of his meds a week PTA so can't cardiovert. Will discuss with Dr. Bronson Ing   4.  Ischemic cardiomyopathy with LVEF 15 to 20%    5.  Minor increase in troponin I with flat pattern not consistent with ACS.   6.  Acute on chronic renal insufficiency with CKD stage 3.  Creatinine is down to 1.53.        For questions or updates, please contact [place] Please consult [email or web address] for contact info under        Signed, Ermalinda Barrios, PA-C  01/04/2018, 10:14 AM    The patient was seen and examined, and I agree with the history, physical exam, assessment and plan as documented above, with modifications as noted below.  The patient is  well-known to me from the outpatient setting.  He was hospitalized with acute on chronic systolic heart failure and acute hypoxic and hypercapnic respiratory failure requiring intubation.  He has since been extubated.  He has been diuresed with IV Lasix.  He still has some evidence of volume overload radiographically.  He  has chronic kidney disease stage III.  He has been having issues with heart rate control and is currently on Toprol-XL 50 mg daily and IV amiodarone.  He is not a candidate for digoxin loading given his renal insufficiency.  Low blood pressures have limited advancement of AV nodal blocking therapy.  LVEF 15 to 20% and left atrial size is reportedly normal by most recent echocardiogram.  I will rebolus IV amiodarone 150 mg today and reassess heart rates tomorrow.  He may need TEE with cardioversion if he is able to lie flat and is more euvolemic tomorrow.  He has a long history of medication noncompliance and had been out of his medications for a week prior to admission.  I discussed possibility of TEE/DCCV with the patient and he is agreeable.   Kate Sable, MD, [facility]  01/04/2018 11:26 AM

## 2018-01-04 NOTE — Progress Notes (Signed)
Peripherally Inserted Central Catheter/Midline Placement  The IV Nurse has discussed with the patient and/or persons authorized to consent for the patient, the purpose of this procedure and the potential benefits and risks involved with this procedure.  The benefits include less needle sticks, lab draws from the catheter, and the patient may be discharged home with the catheter. Risks include, but not limited to, infection, bleeding, blood clot (thrombus formation), and puncture of an artery; nerve damage and irregular heartbeat and possibility to perform a PICC exchange if needed/ordered by physician.  Alternatives to this procedure were also discussed.  Bard Power PICC patient education guide, fact sheet on infection prevention and patient information card has been provided to patient /or left at bedside.  After information given to patient for PICC he was agreeable without questions.  I asked him if he would like me to call any family to explain everything.  He requested that I call his daughter which I did.  She verbalized understanding of the procedure, purpose, risk and benefit.  Confirmation of consent with Heath Lark, Therapist, sports.    PICC/Midline Placement Documentation  PICC Double Lumen 14/23/95 PICC Right Basilic 42 cm 0 cm (Active)  Indication for Insertion or Continuance of Line Vasoactive infusions 01/04/2018  9:10 PM  Exposed Catheter (cm) 0 cm 01/04/2018  9:10 PM  Site Assessment Clean;Dry;Intact 01/04/2018  9:10 PM  Lumen #1 Status Flushed;Saline locked;Blood return noted 01/04/2018  9:10 PM  Lumen #2 Status Flushed;Saline locked;Blood return noted 01/04/2018  9:10 PM  Dressing Type Transparent 01/04/2018  9:10 PM  Dressing Status Clean;Dry;Intact 01/04/2018  9:10 PM  Dressing Intervention New dressing 01/04/2018  9:10 PM  Dressing Change Due 01/11/18 01/04/2018  9:10 PM       Latreece Mochizuki, Nicolette Bang 01/04/2018, 9:11 PM

## 2018-01-04 NOTE — Progress Notes (Signed)
Noted patient appears to have some sleep apnea or decreased saturation, placed on 1 lpm.
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
## 2018-01-04 NOTE — Clinical Social Work Note (Signed)
LCSW following. Pt discussed in Progression today. Per MD, pt appears to have a new infection source. He is running a fever, and is on a drip. Plan is for dc to [facility]. Will follow.

## 2018-01-05 ENCOUNTER — Encounter (HOSPITAL_COMMUNITY)
Admission: EM | Disposition: A | Discharge status: Skilled Nursing Facility | Payer: Self-pay | Source: Other Acute Inpatient Hospital | Attending: Internal Medicine

## 2018-01-05 ENCOUNTER — Inpatient Hospital Stay (HOSPITAL_COMMUNITY): Payer: BLUE CROSS/BLUE SHIELD

## 2018-01-05 ENCOUNTER — Other Ambulatory Visit (HOSPITAL_COMMUNITY): Payer: BLUE CROSS/BLUE SHIELD

## 2018-01-05 DIAGNOSIS — E785 Hyperlipidemia, unspecified: Secondary | ICD-10-CM

## 2018-01-05 DIAGNOSIS — I428 Other cardiomyopathies: Secondary | ICD-10-CM

## 2018-01-05 LAB — CBC
HCT: 43.3 % (ref 39.0–52.0)
Hemoglobin: 13.4 g/dL (ref 13.0–17.0)
MCH: 25.7 pg — ABNORMAL LOW (ref 26.0–34.0)
MCHC: 30.9 g/dL (ref 30.0–36.0)
MCV: 83.1 fL (ref 80.0–100.0)
NRBC: 0 % (ref 0.0–0.2)
Platelets: 63 10*3/uL — ABNORMAL LOW (ref 150–400)
RBC: 5.21 MIL/uL (ref 4.22–5.81)
RDW: 15.8 % — ABNORMAL HIGH (ref 11.5–15.5)
WBC: 9.9 10*3/uL (ref 4.0–10.5)

## 2018-01-05 LAB — BASIC METABOLIC PANEL
Anion gap: 10 (ref 5–15)
BUN: 29 mg/dL — ABNORMAL HIGH (ref 8–23)
CO2: 30 mmol/L (ref 22–32)
Calcium: 7.7 mg/dL — ABNORMAL LOW (ref 8.9–10.3)
Chloride: 102 mmol/L (ref 98–111)
Creatinine, Ser: 1.6 mg/dL — ABNORMAL HIGH (ref 0.61–1.24)
GFR calc Af Amer: 50 mL/min — ABNORMAL LOW (ref >60)
GFR calc non Af Amer: 43 mL/min — ABNORMAL LOW (ref >60)
Glucose, Bld: 106 mg/dL — ABNORMAL HIGH (ref 70–99)
Potassium: 3.5 mmol/L (ref 3.5–5.1)
Sodium: 142 mmol/L (ref 135–145)

## 2018-01-05 LAB — GLUCOSE, CAPILLARY
GLUCOSE-CAPILLARY: 104 mg/dL — AB (ref 70–99)
GLUCOSE-CAPILLARY: 97 mg/dL (ref 70–99)
Glucose-Capillary: 147 mg/dL — ABNORMAL HIGH (ref 70–99)
Glucose-Capillary: 151 mg/dL — ABNORMAL HIGH (ref 70–99)

## 2018-01-05 LAB — URIC ACID: Uric Acid, Serum: 10.2 mg/dL — ABNORMAL HIGH (ref 3.7–8.6)

## 2018-01-05 SURGERY — ECHOCARDIOGRAM, TRANSESOPHAGEAL
Anesthesia: Monitor Anesthesia Care

## 2018-01-05 MED ORDER — PREDNISONE 20 MG PO TABS
50.0000 mg | ORAL_TABLET | Freq: Every day | ORAL | Status: DC
  Administered 2018-01-05 – 2018-01-06 (×2): 50 mg via ORAL
  Filled 2018-01-05 (×2): qty 1

## 2018-01-05 MED ORDER — AMIODARONE IV BOLUS ONLY 150 MG/100ML
150.0000 mg | Freq: Once | INTRAVENOUS | Status: AC
  Administered 2018-01-05: 150 mg via INTRAVENOUS
  Filled 2018-01-05: qty 100

## 2018-01-05 MED ORDER — METOPROLOL SUCCINATE ER 25 MG PO TB24
25.0000 mg | ORAL_TABLET | Freq: Every evening | ORAL | Status: DC
  Administered 2018-01-05 – 2018-01-10 (×4): 25 mg via ORAL
  Filled 2018-01-05 (×5): qty 1

## 2018-01-05 MED ORDER — METOPROLOL SUCCINATE ER 50 MG PO TB24
50.0000 mg | ORAL_TABLET | Freq: Every morning | ORAL | Status: DC
  Administered 2018-01-06: 50 mg via ORAL
  Filled 2018-01-05 (×6): qty 1

## 2018-01-05 NOTE — Care Management Note (Signed)
Case Management Note  Patient Details  Name: GIBRIL MASTRO MRN: [id number] Date of Birth: [birth year]   If discussed at Long Length of Stay Meetings, dates discussed:  01/05/2018  Additional Comments:  Knowledge Escandon, Chauncey Reading, RN 01/05/2018, 12:12 PM
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
## 2018-01-06 ENCOUNTER — Encounter (HOSPITAL_COMMUNITY): Payer: Self-pay | Admitting: Anesthesiology

## 2018-01-06 ENCOUNTER — Encounter (HOSPITAL_COMMUNITY)
Admission: EM | Disposition: A | Discharge status: Skilled Nursing Facility | Payer: Self-pay | Source: Other Acute Inpatient Hospital | Attending: Internal Medicine

## 2018-01-06 ENCOUNTER — Other Ambulatory Visit (HOSPITAL_COMMUNITY): Payer: BLUE CROSS/BLUE SHIELD

## 2018-01-06 DIAGNOSIS — I472 Ventricular tachycardia: Secondary | ICD-10-CM

## 2018-01-06 LAB — BASIC METABOLIC PANEL
Anion gap: 9 (ref 5–15)
BUN: 35 mg/dL — AB (ref 8–23)
CO2: 32 mmol/L (ref 22–32)
Calcium: 7.8 mg/dL — ABNORMAL LOW (ref 8.9–10.3)
Chloride: 100 mmol/L (ref 98–111)
Creatinine, Ser: 1.74 mg/dL — ABNORMAL HIGH (ref 0.61–1.24)
GFR calc non Af Amer: 39 mL/min — ABNORMAL LOW (ref >60)
GFR, EST AFRICAN AMERICAN: 45 mL/min — AB (ref >60)
GLUCOSE: 156 mg/dL — AB (ref 70–99)
Potassium: 4 mmol/L (ref 3.5–5.1)
Sodium: 141 mmol/L (ref 135–145)

## 2018-01-06 LAB — URINE CULTURE: CULTURE: NO GROWTH

## 2018-01-06 LAB — CBC
HCT: 49.2 % (ref 39.0–52.0)
Hemoglobin: 15.4 g/dL (ref 13.0–17.0)
MCH: 26.3 pg (ref 26.0–34.0)
MCHC: 31.3 g/dL (ref 30.0–36.0)
MCV: 84.1 fL (ref 80.0–100.0)
Platelets: 86 10*3/uL — ABNORMAL LOW (ref 150–400)
RBC: 5.85 MIL/uL — ABNORMAL HIGH (ref 4.22–5.81)
RDW: 15.6 % — ABNORMAL HIGH (ref 11.5–15.5)
WBC: 8.5 10*3/uL (ref 4.0–10.5)
nRBC: 0 % (ref 0.0–0.2)

## 2018-01-06 LAB — GLUCOSE, CAPILLARY
Glucose-Capillary: 138 mg/dL — ABNORMAL HIGH (ref 70–99)
Glucose-Capillary: 168 mg/dL — ABNORMAL HIGH (ref 70–99)
Glucose-Capillary: 160 mg/dL — ABNORMAL HIGH (ref 70–99)
Glucose-Capillary: 166 mg/dL — ABNORMAL HIGH (ref 70–99)

## 2018-01-06 SURGERY — CARDIOVERSION
Anesthesia: Monitor Anesthesia Care

## 2018-01-06 MED ORDER — PREDNISONE 20 MG PO TABS
30.0000 mg | ORAL_TABLET | Freq: Every day | ORAL | Status: DC
  Administered 2018-01-07 – 2018-01-11 (×5): 30 mg via ORAL
  Filled 2018-01-06 (×5): qty 1

## 2018-01-06 MED ORDER — SODIUM CHLORIDE 0.9 % IV SOLN
250.0000 mL | INTRAVENOUS | Status: DC
  Administered 2018-01-11: 250 mL via INTRAVENOUS

## 2018-01-06 MED ORDER — SODIUM CHLORIDE 0.9% FLUSH: 3.0000 mL | INTRAVENOUS | Status: DC | PRN

## 2018-01-06 MED ORDER — SODIUM CHLORIDE 0.9% FLUSH
3.0000 mL | Freq: Two times a day (BID) | INTRAVENOUS | Status: DC
  Administered 2018-01-07 – 2018-01-12 (×6): 3 mL via INTRAVENOUS

## 2018-01-06 MED ORDER — TORSEMIDE 20 MG PO TABS: 40.0000 mg | ORAL_TABLET | Freq: Every day | ORAL | Status: DC
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
## 2018-01-06 NOTE — Clinical Social Work Note (Signed)
LCSW following. Per MD, pt not yet stable for dc. Updated Gerald Stabs at [facility]. Will follow.
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
## 2018-01-06 NOTE — Progress Notes (Signed)
Physical Therapy Treatment Patient Details Name: Phillip Wilson MRN: [id number] DOB: 05-20-1945 Today's Date: 01/06/2018    History of Present Illness [name] is a 72 y.o. male with past medical history significant for nonischemic cardiomyopathy (last echo demonstrating ejection fraction 15-20%; 2017), type 2 diabetes mellitus with nephropathy, chronic kidney disease is stage III, essential hypertension, gastroesophageal reflux disease hyperlipidemia and paroxysmal atrial fibrillation (on Savaysa); who presented to the emergency department with complaints of shortness of breath and worsening swelling.  Patient reports symptoms have been present for the last week or so and worsening in the last 3 days prior to admission. Patient expressed running out of his medication approximately 6-7 days prior to hospitalization and reported not the best compliance with low-sodium diet.  He was seen by primary care physician approximately about 6 days prior to this visit and at that time had an elevated BNP and was short of breath; patient was instructed to come to the hospital for admission and to receive acute diuresis; his medications were refilled and he will decide took 10 by mouth instead of being admitted at that time.  Symptoms failed to improve after restarting medication and he presented to the emergency department for further evaluation and management.  Patient reported orthopnea    PT Comments    Patient demonstrates increased endurance/tolerance for sitting up at bedside and able to transfer to chair using RW.  Patient requires repeated verbal cueing and demonstration to complete BLE  ROM/strengthening exercises while seated at bedside, limited to a few steps using RW due to BLE weakness with buckling of knees.  Patient tolerated sitting up at bedside after therapy - RN notified.  Patient will benefit from continued physical therapy in hospital and recommended venue below to increase strength,  balance, endurance for safe ADLs and gait.    Follow Up Recommendations  SNF     Equipment Recommendations  None recommended by PT    Recommendations for Other Services       Precautions / Restrictions Precautions Precautions: Fall Restrictions Weight Bearing Restrictions: No    Mobility  Bed Mobility Overal bed mobility: Needs Assistance Bed Mobility: Supine to Sit     Supine to sit: Min assist     General bed mobility comments: labored movement  Transfers Overall transfer level: Needs assistance Equipment used: Rolling walker (2 wheeled) Transfers: Sit to/from [facility] Sit to Stand: Mod assist;Min assist Stand pivot transfers: Mod assist       General transfer comment: slow labored movement  Ambulation/Gait Ambulation/Gait assistance: Mod assist;Max assist Gait Distance (Feet): 3 Feet Assistive device: Rolling walker (2 wheeled) Gait Pattern/deviations: Decreased step length - right;Decreased step length - left;Decreased stride length Gait velocity: slow   General Gait Details: limited to 4-5 slow unsteady steps with buckling of knees due to BLE weakness   Stairs             Wheelchair Mobility    Modified Rankin (Stroke Patients Only)       Balance Overall balance assessment: Needs assistance Sitting-balance support: Feet supported;No upper extremity supported Sitting balance-Leahy Scale: Good     Standing balance support: During functional activity;Bilateral upper extremity supported Standing balance-Leahy Scale: Poor Standing balance comment: fair/poor with RW                            Cognition Arousal/Alertness: Awake/alert Behavior During Therapy: WFL for tasks assessed/performed Overall Cognitive Status: Within Functional Limits  for tasks assessed                                        Exercises General Exercises - Lower Extremity Long Arc Quad:  AROM;Seated;Strengthening;Both;10 reps Hip Flexion/Marching: Seated;AROM;Strengthening;Both;10 reps Toe Raises: Seated;AROM;Strengthening;10 reps;Both Heel Raises: Seated;AROM;Strengthening;Both;10 reps    General Comments        Pertinent Vitals/Pain Pain Assessment: No/denies pain    Home Living                      Prior Function            PT Goals (current goals can now be found in the care plan section) Acute Rehab PT Goals Patient Stated Goal: return home PT Goal Formulation: With patient Time For Goal Achievement: 01/15/18 Potential to Achieve Goals: Good Progress towards PT goals: Progressing toward goals    Frequency    Min 3X/week      PT Plan Current plan remains appropriate    Co-evaluation              AM-PAC PT "6 Clicks" Mobility   Outcome Measure  Help needed turning from your back to your side while in a flat bed without using bedrails?: Total Help needed moving from lying on your back to sitting on the side of a flat bed without using bedrails?: Total Help needed moving to and from a bed to a chair (including a wheelchair)?: Total Help needed standing up from a chair using your arms (e.g., wheelchair or bedside chair)?: A Lot Help needed to walk in hospital room?: A Lot Help needed climbing 3-5 steps with a railing? : Total 6 Click Score: 8    End of Session   Activity Tolerance: Patient tolerated treatment well;Patient limited by fatigue Patient left: in chair;with call bell/phone within reach Nurse Communication: Mobility status PT Visit Diagnosis: Unsteadiness on feet (R26.81);Other abnormalities of gait and mobility (R26.89);Muscle weakness (generalized) (M62.81)     Time: 5400-8676 PT Time Calculation (min) (ACUTE ONLY): 29 min  Charges:  $Therapeutic Exercise: 8-22 mins $Therapeutic Activity: 8-22 mins                     3:02 PM, 01/06/18 Lonell Grandchild, MPT Physical Therapist with [facility] [phone] office [phone] mobile phone

## 2018-01-06 NOTE — Progress Notes (Signed)
I was called by anesthesiology earlier this afternoon.  The anesthesiologist had a lot of concerns about the safety of administering anesthesia and proceeding with TEE/DCCV at [facility] as this patient would be at high risk for complications/coding.  They would prefer that if the procedure has to be done, then it should be performed at [facility].  The plan will be to transfer the patient to [facility] on a hospitalist service and I will ask my electrophysiology colleagues to evaluate the patient in order to obtain recommendations regarding the best management strategy. Hopefully, TEE/DCCV can be attempted there.

## 2018-01-06 NOTE — Progress Notes (Signed)
Cardioversion procedure has not been performed. EKG is still on hold
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
## 2018-01-07 LAB — BASIC METABOLIC PANEL
Anion gap: 10 (ref 5–15)
Anion gap: 12 (ref 5–15)
BUN: 53 mg/dL — ABNORMAL HIGH (ref 8–23)
BUN: 60 mg/dL — ABNORMAL HIGH (ref 8–23)
CO2: 30 mmol/L (ref 22–32)
CO2: 30 mmol/L (ref 22–32)
Calcium: 8 mg/dL — ABNORMAL LOW (ref 8.9–10.3)
Calcium: 8 mg/dL — ABNORMAL LOW (ref 8.9–10.3)
Chloride: 99 mmol/L (ref 98–111)
Chloride: 95 mmol/L — ABNORMAL LOW (ref 98–111)
Creatinine, Ser: 2.36 mg/dL — ABNORMAL HIGH (ref 0.61–1.24)
Creatinine, Ser: 2.71 mg/dL — ABNORMAL HIGH (ref 0.61–1.24)
GFR calc Af Amer: 31 mL/min — ABNORMAL LOW (ref >60)
GFR calc Af Amer: 26 mL/min — ABNORMAL LOW (ref >60)
GFR calc non Af Amer: 27 mL/min — ABNORMAL LOW (ref >60)
GFR calc non Af Amer: 23 mL/min — ABNORMAL LOW (ref >60)
Glucose, Bld: 148 mg/dL — ABNORMAL HIGH (ref 70–99)
Glucose, Bld: 164 mg/dL — ABNORMAL HIGH (ref 70–99)
Potassium: 4.1 mmol/L (ref 3.5–5.1)
Potassium: 4.3 mmol/L (ref 3.5–5.1)
Sodium: 139 mmol/L (ref 135–145)
Sodium: 137 mmol/L (ref 135–145)

## 2018-01-07 LAB — CBC
HEMATOCRIT: 48.3 % (ref 39.0–52.0)
Hemoglobin: 15.2 g/dL (ref 13.0–17.0)
MCH: 25.9 pg — ABNORMAL LOW (ref 26.0–34.0)
MCHC: 31.5 g/dL (ref 30.0–36.0)
MCV: 82.4 fL (ref 80.0–100.0)
Platelets: 108 10*3/uL — ABNORMAL LOW (ref 150–400)
RBC: 5.86 MIL/uL — ABNORMAL HIGH (ref 4.22–5.81)
RDW: 16.8 % — ABNORMAL HIGH (ref 11.5–15.5)
WBC: 14.9 10*3/uL — ABNORMAL HIGH (ref 4.0–10.5)
nRBC: 0 % (ref 0.0–0.2)

## 2018-01-07 LAB — GLUCOSE, CAPILLARY
Glucose-Capillary: 141 mg/dL — ABNORMAL HIGH (ref 70–99)
Glucose-Capillary: 181 mg/dL — ABNORMAL HIGH (ref 70–99)
Glucose-Capillary: 185 mg/dL — ABNORMAL HIGH (ref 70–99)

## 2018-01-07 NOTE — Progress Notes (Signed)
[facility] here to pick up patient to transfer to [place], bed 7. Crystal (daughter) called and made aware. Sister of patient took patient belongings home per patient request. Patient alert and oriented x4. No complaints of pain, shortness of breath, chest pain, dizziness, nausea or vomiting. Patient's systolic B/P's in the 31'G and 90's with Maps above 60. Dr. Bronson Ing aware and ok to hold Toprol due to B/P's, patient asymptomatic and sitting up in chair alert. Dr. Dyann Kief updated earlier in shift. Patient voided in commode and had a BM. Dr. Dyann Kief aware that patient has voided since foley removed last night.  Report called and given to [place] at [facility] on 6E.

## 2018-01-07 NOTE — Clinical Social Work Note (Signed)
LCSW following. Pt discussed in Progression today. Per MD, pt will need to transfer to [facility] for his TEE/EP eval/etc. Unsure if pt will return to [facility] or remain at [facility]. At this time, plan remains for dc to [facility] for rehab after hospitalization if they have bed availability and pt remains appropriate. [facility] CSW will follow as needed. Updated Gerald Stabs at [facility]. Pt will likely need a new insurance authorization if he needs SNF rehab.

## 2018-01-07 NOTE — Progress Notes (Signed)
PROGRESS NOTE    Phillip Wilson  JSE:[id number] DOB: 1945/02/27 DOA: 12/28/2017 PCP: Alycia Rossetti, MD    Brief Narrative:  72 y.o. male with past medical history significant for nonischemic cardiomyopathy (last echo demonstrating ejection fraction 15-20%; 2017), type 2 diabetes mellitus with nephropathy, chronic kidney disease is stage III, essential hypertension, gastroesophageal reflux disease hyperlipidemia and paroxysmal atrial fibrillation (on Savaysa); who presented to the emergency department with complaints of shortness of breath and worsening swelling.  Patient reports symptoms have been present for the last week or so and worsening in the last 3 days prior to admission. Patient expressed running out of his medication approximately 6-7 days prior to hospitalization and reported not the best compliance with low-sodium diet.  He was seen by primary care physician approximately about 6 days prior to this visit and at that time had an elevated BNP and was short of breath; patient was instructed to come to the hospital for admission and to receive acute diuresis; his medications were refilled and he will decide took 10 by mouth instead of being admitted at that time.  Symptoms failed to improve after restarting medication and he presented to the emergency department for further evaluation and management.  Patient reported orthopnea Patient denies chest pain, fever, chills, nausea/vomiting, abdominal pain, palpitations, headache, blurred vision, focal weakness, dysuria, hematuria, melena, hematochezia or any other acute complaints.  Patient continue having difficult to control A. fib with RVR, experienced acute respiratory failure with hypoxia and required intubation consult December 30, 2017.  After aggressive diuresis fluid overload status has not significantly improved but patient continued to have uncontrolled A. fib.  Cardiology is on board and assisting with management and has recommended  transfer to [facility] for TEE and EP evaluation.  Assessment & Plan: 1-acute respiratory failure with hypoxia in the setting of acute on chronic systolic heart failure and aspiration pneumonia -Patient required to be intubated 12/2-12/5 -Has completed antibiotic therapy using Unasyn and Augmentin -Currently afebrile and with normal WBCs -Overall breathing significantly improve and denying orthopnea -In the setting of acute on chronic renal failure diuretics will be placed on hold for now. -follow daily weights and low sodium diet.   2-Acute on chronic systolic HF (heart failure) (HCC) -Patient ejection fraction 15 to 20% -Continue beta-blocker -No ACE/ARB in the setting of acute on chronic renal failure -Prior history of allergic reaction to [facility] -Cardiology is on board and will continue following rec's and guided treatment -will hold diuretics for now -Good urine output and overall great diuresis accomplished . -Patient will need evaluation by EP for ICD -Continue daily weights and strict intake and output.  3-A. fib with RVR -Despite correction predisposing/triggering causes, patient continued to be in A. fib with RVR. -Cardiology has recommended TEE with cardioversion -Will transfer to [facility] to accomplish this procedure and to have evaluation by EP -For now continue amiodarone drip and current dose of oral beta-blocker. -CHADSVASC score 4; continue edoxaban  4-Hyperlipidemia -continue statins  5-HTN (hypertension) -soft but stable and tolerating current dose of b-blocker  -Diuretics will be discontinued -patient needed transiently the use of pressors; will watch closely and if needed will resume for pressure support.  6-type 2 diabetes with nephropathy -Continue sliding scale insulin and Lantus -Follow CBGs and further adjust hypoglycemic regimen as needed. -last A1C 6.6 -not using any outpatient hypoglycemic regimen at this  time.  7-Gastroesophageal reflux disease -continue PPI  8-acute kidney injury on stage 3 chronic kidney disease (  [name]) -In the setting of diuresis, hypertension and concern for cardiorenal syndrome. -Stop diuretics -Follow trend closely -If needed get nephrology consult to see patient.  9-left hand gout flare -improving -continue prednisone, follow response and complete 5-7 days therapy.  10-hematuria -seen by urology, felt most likely irritation by foley, use of anticoagulation and underlying BPH. -recommendations given for outpatient work up -foley removed on 12/10 and patient able to urinate. -hematuria is almost resolved now.    DVT prophylaxis: edoxaban  Code Status: Full code Family Communication: No family at bedside. Disposition Plan: Patient will be transferred to [facility] to pursued TEE with cardioversion and further evaluation/management by EP.  Giving you acute on chronic renal failure diuretics will be discontinued.  Follow renal function trend closely and if needed involve nephrology service.   Consultants:   Pulmonary service  Cardiology  Procedures:   See below for x-ray reports  2D echo - Left ventricle: The cavity size was normal. Wall thickness was   increased in a pattern of mild LVH. Systolic function was   severely reduced. The estimated ejection fraction was in the   range of 15% to 20%. Diffuse hypokinesis. The study was not   technically sufficient to allow evaluation of LV diastolic   dysfunction due to atrial fibrillation. - Aortic valve: Mildly calcified annulus. Trileaflet. - Mitral valve: Mildly calcified annulus. There was mild   regurgitation. - Right ventricle: The cavity size was moderately dilated. Systolic   function was severely reduced. - Right atrium: The atrium was mildly dilated. - Tricuspid valve: There was mild regurgitation. Peak RV-RA   gradient (S): 36 mm Hg. - Pulmonary arteries: Systolic pressure could not  be accurately   estimated. - Inferior vena cava: The vessel was dilated. Patient on   ventilator. - Pericardium, extracardiac: A small pericardial effusion was   identified posterior to the heart.  Planning TEE and cardioversion  Antimicrobials:  Anti-infectives (From admission, onward)   Start     Dose/Rate Route Frequency Ordered Stop   01/02/18 2200  amoxicillin-clavulanate (AUGMENTIN) 875-125 MG per tablet 1 tablet  Status:  Discontinued     1 tablet Oral Every 12 hours 01/02/18 1025 01/05/18 1452   12/29/17 1600  Ampicillin-Sulbactam (UNASYN) 3 g in sodium chloride 0.9 % 100 mL IVPB     3 g 200 mL/hr over 30 Minutes Intravenous Every 8 hours 12/29/17 0950 01/02/18 1641   12/29/17 0200  Ampicillin-Sulbactam (UNASYN) 3 g in sodium chloride 0.9 % 100 mL IVPB  Status:  Discontinued     3 g 200 mL/hr over 30 Minutes Intravenous Every 6 hours 12/29/17 0145 12/29/17 0950      Subjective: Patient reports some improvement in his breathing.  Denies chest pain or palpitations at this time.  Expressed feeling weak, deconditioned and is still experiencing shortness of breath with minimal exertion.  He has been able to urinate after having Foley catheter removed.  Objective: Vitals:   01/07/18 0800 01/07/18 0830 01/07/18 0900 01/07/18 1003  BP: 98/67 (!) 83/68  (!) 87/50  Pulse:    (!) 122  Resp: 14 15    Temp:      TempSrc:      SpO2:   100%   Weight:      Height:        Intake/Output Summary (Last 24 hours) at 01/07/2018 1254 Last data filed at 01/07/2018 1005 Gross per 24 hour  Intake 481.45 ml  Output 200 ml  Net 281.45 ml  Filed Weights   01/05/18 0500 01/06/18 0500 01/07/18 0436  Weight: 89.4 kg 89.6 kg 91.9 kg    Examination: General exam: Alert, awake, oriented x 3; seated on chair, reports improvement in his breathing, overall weakness, deconditioning and shortness of breath on exertion.  He denies chest pain or palpitation at this time. Respiratory system: Fine  crackles at the bases, no wheezing, no using accessory muscles. Cardiovascular system: Irregular irregular.  Soft murmur appreciated on exam, no rubs, no gallops, no JVD appreciated.  Gastrointestinal system: Abdomen is nondistended, soft and nontender. No organomegaly or masses felt. Normal bowel sounds heard. Central nervous system: Alert and oriented.  Moving 4 limbs spontaneously. Extremities: No cyanosis or clubbing; trace to 1+ edema appreciated bilaterally.  TED hoses in place. Skin: No rashes, lesions or ulcers Psychiatry: Judgement and insight appear normal. Mood & affect appropriate.   Data Reviewed: I have personally reviewed following labs and imaging studies  CBC: Recent Labs  Lab 01/03/18 0431 01/04/18 0435 01/05/18 0352 01/06/18 0408 01/07/18 0358  WBC 11.1* 12.6* 9.9 8.5 14.9*  HGB 15.3 13.9 13.4 15.4 15.2  HCT 49.4 44.6 43.3 49.2 48.3  MCV 86.7 83.4 83.1 84.1 82.4  PLT 74* 70* 63* 86* 947*   Basic Metabolic Panel: Recent Labs  Lab 01/03/18 0431 01/04/18 0435 01/05/18 0352 01/06/18 0408 01/07/18 0358  NA 143 143 142 141 139  K 2.9* 3.4* 3.5 4.0 4.1  CL 102 103 102 100 99  CO2 31 30 30  32 30  GLUCOSE 110* 123* 106* 156* 148*  BUN 24* 25* 29* 35* 53*  CREATININE 1.53* 1.53* 1.60* 1.74* 2.36*  CALCIUM 8.0* 8.0* 7.7* 7.8* 8.0*   GFR: Estimated Creatinine Clearance: 32.1 mL/min (A) (by C-G formula based on SCr of 2.36 mg/dL (H)).   Liver Function Tests: Recent Labs  Lab 01/01/18 0420 01/04/18 0435  AST 42* 24  ALT 22 17  ALKPHOS 109 102  BILITOT 1.3* 1.3*  PROT 5.0* 5.4*  ALBUMIN 2.3* 2.2*   CBG: Recent Labs  Lab 01/06/18 1152 01/06/18 1623 01/06/18 2119 01/07/18 0730 01/07/18 1132  GLUCAP 168* 160* 166* 141* 181*   Urine analysis:    Component Value Date/Time   COLORURINE RED (A) 01/04/2018 1306   APPEARANCEUR TURBID (A) 01/04/2018 1306   LABSPEC  01/04/2018 1306    TEST NOT REPORTED DUE TO COLOR INTERFERENCE OF URINE PIGMENT    PHURINE  01/04/2018 1306    TEST NOT REPORTED DUE TO COLOR INTERFERENCE OF URINE PIGMENT   GLUCOSEU (A) 01/04/2018 1306    TEST NOT REPORTED DUE TO COLOR INTERFERENCE OF URINE PIGMENT   HGBUR (A) 01/04/2018 1306    TEST NOT REPORTED DUE TO COLOR INTERFERENCE OF URINE PIGMENT   HGBUR negative 02/14/2008 1349   BILIRUBINUR (A) 01/04/2018 1306    TEST NOT REPORTED DUE TO COLOR INTERFERENCE OF URINE PIGMENT   KETONESUR (A) 01/04/2018 1306    TEST NOT REPORTED DUE TO COLOR INTERFERENCE OF URINE PIGMENT   PROTEINUR (A) 01/04/2018 1306    TEST NOT REPORTED DUE TO COLOR INTERFERENCE OF URINE PIGMENT   UROBILINOGEN 1.0 01/21/2011 1246   NITRITE (A) 01/04/2018 1306    TEST NOT REPORTED DUE TO COLOR INTERFERENCE OF URINE PIGMENT   LEUKOCYTESUR (A) 01/04/2018 1306    TEST NOT REPORTED DUE TO COLOR INTERFERENCE OF URINE PIGMENT    Recent Results (from the past 240 hour(s))  Culture, Urine     Status: None   Collection Time: 01/01/18  9:00 PM  Result Value Ref Range Status   Specimen Description   Final    URINE, CLEAN CATCH Performed at [facility], [address], [place], [place] [address]    Special Requests   Final    NONE Performed at [facility], [address] [place], [place] [address]    Culture   Final    NO GROWTH Performed at [facility], [place] [address] [place], [place] [address]    Report Status 01/03/2018 FINAL  Final  Culture, blood (Routine X 2) w Reflex to ID Panel     Status: None (Preliminary result)   Collection Time: 01/04/18 10:53 AM  Result Value Ref Range Status   Specimen Description LEFT ANTECUBITAL  Final   Special Requests   Final    BOTTLES DRAWN AEROBIC ONLY Blood Culture adequate volume   Culture   Final    NO GROWTH 3 DAYS Performed at [facility], [address], [place], [place] [address]    Report Status PENDING  Incomplete  Culture, blood (Routine X 2) w Reflex to ID Panel     Status: None (Preliminary result)   Collection Time:  01/04/18 11:00 AM  Result Value Ref Range Status   Specimen Description BLOOD LEFT HAND  Final   Special Requests   Final    BOTTLES DRAWN AEROBIC ONLY Blood Culture adequate volume   Culture   Final    NO GROWTH 3 DAYS Performed at [facility], [address], [place], [place] [address]    Report Status PENDING  Incomplete  Culture, Urine     Status: None   Collection Time: 01/04/18  1:06 PM  Result Value Ref Range Status   Specimen Description   Final    URINE, CATHETERIZED Performed at [facility], [address] [place], [place] [address]    Special Requests   Final    NONE Performed at [facility], [address] [place], [place] [address]    Culture   Final    NO GROWTH Performed at [facility], [place] [address] [place],  [address]    Report Status 01/06/2018 FINAL  Final     Radiology Studies: No results found. Scheduled Meds: . aspirin  81 mg Oral Daily  . atorvastatin  40 mg Oral q1800  . Chlorhexidine Gluconate Cloth  6 each Topical Daily  . edoxaban  30 mg Oral Q24H  . insulin aspart  0-9 Units Subcutaneous TID WC  . metoprolol succinate  25 mg Oral QPM  . metoprolol succinate  50 mg Oral q morning - 10a  . pantoprazole  40 mg Oral Daily  . potassium chloride  40 mEq Oral Daily  . predniSONE  30 mg Oral Q breakfast  . sodium chloride flush  10-40 mL Intracatheter Q12H  . sodium chloride flush  3 mL Intravenous Q12H   Continuous Infusions: . sodium chloride    . amiodarone 30 mg/hr (01/07/18 1016)  . norepinephrine (LEVOPHED) Adult infusion Stopped (12/31/17 0750)  . phenylephrine (NEO-SYNEPHRINE) Adult infusion Stopped (12/30/17 0906)     LOS: 10 days    Time spent: 35 minutes.  Greater than 50% of this time was spent in direct contact with the patient, coordinating care and discussing relevant ongoing clinical issues, including continue ongoing uncontrolled heart rate and rhythm; need for TEE with cardioversion to further stabilize his  condition and explaining further recommendations by urology service regarding history of hematuria and urinary retention.  Case has been discussed with cardiology in details and will follow further recommendations.  Patient continued to be critically ill with guarded prognosis.  At this moment he has developed acute on chronic renal failure with concerns of ATN due to use of nephrotoxic agents and hypotension, versus worsening function in the setting of cardiorenal syndrome.   Barton Dubois, MD [facility] Pager [phone]  If 7PM-7AM, please contact night-coverage [email or web address] Password [facility] 01/07/2018, 12:54 PM

## 2018-01-07 NOTE — Progress Notes (Signed)
Progress Note  Patient Name: Phillip Wilson Date of Encounter: 01/07/2018  Primary Cardiologist: Kate Sable, MD   Subjective   No new complaints.  Heart rates remain elevated.  Anesthesiology preferred TEE/DCCV be performed [facility] as they felt patient would be at high risk for complications/coding. He is awaiting transfer as there is a bed shortage at [facility].  Inpatient Medications    Scheduled Meds: . aspirin  81 mg Oral Daily  . atorvastatin  40 mg Oral q1800  . Chlorhexidine Gluconate Cloth  6 each Topical Daily  . edoxaban  30 mg Oral Q24H  . insulin aspart  0-9 Units Subcutaneous TID WC  . metoprolol succinate  25 mg Oral QPM  . metoprolol succinate  50 mg Oral q morning - 10a  . pantoprazole  40 mg Oral Daily  . potassium chloride  40 mEq Oral Daily  . predniSONE  30 mg Oral Q breakfast  . sodium chloride flush  10-40 mL Intracatheter Q12H  . sodium chloride flush  3 mL Intravenous Q12H  . torsemide  40 mg Oral Daily   Continuous Infusions: . sodium chloride    . amiodarone 30 mg/hr (01/07/18 1950)  . norepinephrine (LEVOPHED) Adult infusion Stopped (12/31/17 0750)  . phenylephrine (NEO-SYNEPHRINE) Adult infusion Stopped (12/30/17 0906)   PRN Meds: acetaminophen, fentaNYL (SUBLIMAZE) injection, fentaNYL (SUBLIMAZE) injection, levalbuterol, morphine injection, ondansetron (ZOFRAN) IV, sodium chloride, sodium chloride flush, sodium chloride flush   Vital Signs    Vitals:   01/07/18 0715 01/07/18 0800 01/07/18 0830 01/07/18 0900  BP:  98/67 (!) 83/68   Pulse: 80     Resp: (!) 25 14 15    Temp:      TempSrc:      SpO2: 94%   100%  Weight:      Height:        Intake/Output Summary (Last 24 hours) at 01/07/2018 0925 Last data filed at 01/07/2018 9326 Gross per 24 hour  Intake 458.45 ml  Output 200 ml  Net 258.45 ml   Filed Weights   01/05/18 0500 01/06/18 0500 01/07/18 0436  Weight: 89.4 kg 89.6 kg 91.9 kg    Telemetry    A Fib,  HR high 90's-120 range - Personally Reviewed  ECG    No new tracings- Personally Reviewed  Physical Exam   GEN: No acute distress.   Neck: No JVD Cardiac:  Tachycardic, irregular, no murmurs, rubs, or gallops.  Respiratory: Clear to auscultation bilaterally. GI: Soft, nontender, non-distended  MS: No edema; No deformity. Neuro:  Nonfocal  Psych: Normal affect   Labs    Chemistry Recent Labs  Lab 01/01/18 0420  01/04/18 0435 01/05/18 0352 01/06/18 0408 01/07/18 0358  NA 146*   < > 143 142 141 139  K 3.2*   < > 3.4* 3.5 4.0 4.1  CL 107   < > 103 102 100 99  CO2 29   < > 30 30 32 30  GLUCOSE 104*   < > 123* 106* 156* 148*  BUN 41*   < > 25* 29* 35* 53*  CREATININE 1.82*   < > 1.53* 1.60* 1.74* 2.36*  CALCIUM 7.6*   < > 8.0* 7.7* 7.8* 8.0*  PROT 5.0*  --  5.4*  --   --   --   ALBUMIN 2.3*  --  2.2*  --   --   --   AST 42*  --  24  --   --   --  ALT 22  --  17  --   --   --   ALKPHOS 109  --  102  --   --   --   BILITOT 1.3*  --  1.3*  --   --   --   GFRNONAA 37*   < > 45* 43* 39* 27*  GFRAA 43*   < > 53* 50* 45* 31*  ANIONGAP 10   < > 10 10 9 10    < > = values in this interval not displayed.     Hematology Recent Labs  Lab 01/05/18 0352 01/06/18 0408 01/07/18 0358  WBC 9.9 8.5 14.9*  RBC 5.21 5.85* 5.86*  HGB 13.4 15.4 15.2  HCT 43.3 49.2 48.3  MCV 83.1 84.1 82.4  MCH 25.7* 26.3 25.9*  MCHC 30.9 31.3 31.5  RDW 15.8* 15.6* 16.8*  PLT 63* 86* 108*    Cardiac EnzymesNo results for input(s): TROPONINI in the last 168 hours. No results for input(s): TROPIPOC in the last 168 hours.   BNPNo results for input(s): BNP, PROBNP in the last 168 hours.   DDimer No results for input(s): DDIMER in the last 168 hours.   Radiology    US Venous Img Upper Uni Left  Result Date: 01/05/2018 CLINICAL DATA:  Left hand swelling since 01/04/2018. Shortness of breath. History of central line placement (right upper extremity approach PICC line). Evaluate for DVT. EXAM:  LEFT UPPER EXTREMITY VENOUS DOPPLER ULTRASOUND TECHNIQUE: Gray-scale sonography with graded compression, as well as color Doppler and duplex ultrasound were performed to evaluate the upper extremity deep venous system from the level of the subclavian vein and including the jugular, axillary, basilic, radial, ulnar and upper cephalic vein. Spectral Doppler was utilized to evaluate flow at rest and with distal augmentation maneuvers. COMPARISON:  None. FINDINGS: Contralateral Subclavian Vein: Respiratory phasicity is normal and symmetric with the symptomatic side. No evidence of thrombus. Normal compressibility. Internal Jugular Vein: No evidence of thrombus. Normal compressibility, respiratory phasicity and response to augmentation. Subclavian Vein: No evidence of thrombus. Normal compressibility, respiratory phasicity and response to augmentation. Axillary Vein: No evidence of thrombus. Normal compressibility, respiratory phasicity and response to augmentation. Cephalic Vein: No evidence of thrombus. Normal compressibility, respiratory phasicity and response to augmentation. Basilic Vein: No evidence of thrombus. Normal compressibility, respiratory phasicity and response to augmentation. Brachial Veins: No evidence of thrombus within either of the duplicated left brachial veins. Normal compressibility, respiratory phasicity and response to augmentation. Radial Veins: No evidence of thrombus. Normal compressibility, respiratory phasicity and response to augmentation. Ulnar Veins: No evidence of thrombus. Normal compressibility, respiratory phasicity and response to augmentation. Venous Reflux:  None visualized. Other Findings: Subcutaneous edema is noted at the level of the forearm. IMPRESSION: No evidence of DVT within the left upper extremity. Electronically Signed   By: Sandi Mariscal M.D.   On: 01/05/2018 15:37    Cardiac Studies   Echocardiogram 12/29/2017: Study Conclusions  - Left ventricle: The cavity  size was normal. Wall thickness was increased in a pattern of mild LVH. Systolic function was severely reduced. The estimated ejection fraction was in the range of 15% to 20%. Diffuse hypokinesis. The study was not technically sufficient to allow evaluation of LV diastolic dysfunction due to atrial fibrillation. - Aortic valve: Mildly calcified annulus. Trileaflet. - Mitral valve: Mildly calcified annulus. There was mild regurgitation. - Right ventricle: The cavity size was moderately dilated. Systolic function was severely reduced. - Right atrium: The atrium was mildly dilated. - Tricuspid  valve: There was mild regurgitation. Peak RV-RA gradient (S): 36 mm Hg. - Pulmonary arteries: Systolic pressure could not be accurately estimated. - Inferior vena cava: The vessel was dilated. Patient on ventilator. - Pericardium, extracardiac: A small pericardial effusion was identified posterior to the heart.  Patient Profile     72 y.o. male with a history of nonischemic cardiomyopathyEF 15-20%, paroxysmal to persistent atrial fibrillation, hypertension, type 2 diabetes mellitus, CKD stage III, and hyperlipidemiawho is being seen for the evaluation ofrapid atrial fibrillation and acute on chronic systolic heart failure.  Assessment & Plan    1.Acute on chronic systolic heart failure. He has been transitioned to torsemide 40 mg daily on 12/11 due to rising creatinine and has not received any today. Uncertain about I/O accuracy. Creatinine now up to 2.36 reflective of cardiorenal syndrome. Also possibly a component of ATN due to hypotension. I will hold torsemide altogether. May need nephrology involvement. LVEF 15-20%, nonischemic cardiomyopathy.  2.Acute hypoxic and hypercapnic respiratory failurerequiring intubation: Nowextubated. Secondary to decompensated CHF and aspiration pneumonitis. Breathing much improved.  3.Persistent rapidatrial fibrillation:He is  anticoagulated withSavaysa. Remains on IV amiodarone for heart rate controlwhich I rebolused 150 mg on 12/9 and 12/10.HR's high 90's to 120 range with 26 beat run of VT on 12/10 at 1501.Also on Toprol XL 50 mg q am and 25 mg q pm. He is not a candidate for digoxin loading given his renal insufficiency. Low blood pressures have limited advancement of AV nodal blocking therapy. I think he will need an attempt at TEE with cardioversion but anesthesiology felt it was too high risk to be performed at [facility].  For this reason he is awaiting transfer to [facility] where I will ask my EP colleagues to evaluate him to determine the best management strategy.  He has a long history of medication noncompliance and had been out of his medications for a week prior to admission. If cardioversion is unsuccessful, may need to consider AV node ablation and pacemaker once other medical issues resolved.  4.Nonischemic cardiomyopathy with LVEF 15 to 20%: Presumably nonischemic based on chart review.LVEF has fluctuated over time. Could be component of tachycardia-mediated cardiomyopathy.  5.Minor increase in troponin I with flat pattern not consistent with ACS.  6.Acute on chronic renal insufficiency with CKD stage 3. Creatinine is up toto 2.36 reflective of cardiorenal syndrome and possibly a component of ATN due to hypotension. I will hold torsemide altogether. He did not receive any today.  7. Ventricular tachycardia: This occurred while on IV amiodarone with multiple boluses. He should get an ICD but he will first need to demonstrate medication compliance.  Overalll prognosis is poor.      For questions or updates, please contact [place] Please consult [email or web address] for contact info under Cardiology/STEMI.      Signed, Kate Sable, MD  01/07/2018, 9:25 AM

## 2018-01-07 NOTE — Care Management Note (Signed)
Case Management Note  Patient Details  Name: AVION KUTZER MRN: [id number] Date of Birth: 13-Jul-1945   If discussed at Long Length of Stay Meetings, dates discussed:  01/07/2018    Additional Comments:  Jaeli Grubb, Chauncey Reading, RN 01/07/2018, 3:35 PM

## 2018-01-08 LAB — GLUCOSE, CAPILLARY
GLUCOSE-CAPILLARY: 182 mg/dL — AB (ref 70–99)
Glucose-Capillary: 101 mg/dL — ABNORMAL HIGH (ref 70–99)
Glucose-Capillary: 136 mg/dL — ABNORMAL HIGH (ref 70–99)
Glucose-Capillary: 141 mg/dL — ABNORMAL HIGH (ref 70–99)
Glucose-Capillary: 145 mg/dL — ABNORMAL HIGH (ref 70–99)

## 2018-01-08 LAB — BASIC METABOLIC PANEL
Anion gap: 12 (ref 5–15)
BUN: 59 mg/dL — ABNORMAL HIGH (ref 8–23)
CHLORIDE: 95 mmol/L — AB (ref 98–111)
CO2: 30 mmol/L (ref 22–32)
CREATININE: 2.48 mg/dL — AB (ref 0.61–1.24)
Calcium: 7.9 mg/dL — ABNORMAL LOW (ref 8.9–10.3)
GFR calc Af Amer: 29 mL/min — ABNORMAL LOW (ref >60)
GFR calc non Af Amer: 25 mL/min — ABNORMAL LOW (ref >60)
Glucose, Bld: 173 mg/dL — ABNORMAL HIGH (ref 70–99)
Potassium: 4.4 mmol/L (ref 3.5–5.1)
Sodium: 137 mmol/L (ref 135–145)

## 2018-01-08 MED ORDER — ENSURE ENLIVE PO LIQD
237.0000 mL | Freq: Two times a day (BID) | ORAL | Status: DC
  Administered 2018-01-08 – 2018-01-13 (×10): 237 mL via ORAL
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
## 2018-01-08 NOTE — Progress Notes (Signed)
Nutrition Follow-up  DOCUMENTATION CODES:   Obesity unspecified  INTERVENTION:    Add Ensure Enlive po BID, each supplement provides 350 kcal and 20 grams of protein  NUTRITION DIAGNOSIS:   Inadequate oral intake related to decreased appetite as evidenced by meal completion < 50%.  Ongoing  GOAL:   Patient will meet greater than or equal to 90% of their needs  Unmet  MONITOR:   PO intake, Supplement acceptance  ASSESSMENT:   Patient is a 72 yo male currently intubated (12/2 @ 2335) on ventilator support. He has a hx of DM-2, CHF, Cardiomyopathy (LVEF-15-20%), GERD, HTN and CKD. Patient presents with acute hypoxic/hypercapnic respiratory failure, acute on chronic CHF. Deep pitting edema to bilateral feet. Discussed pt with RN.   Patient was [facility]. He reports that he has been eating well. He likes Ensure; has drank it in the past and thinks it gave him energy.   Per flow sheets, patient consumed 50% of breakfast and lunch today. Labs and medications reviewed.  CBG's: [phone]   Diet Order:   Diet Order            Diet heart healthy/carb modified Room service appropriate? Yes; Fluid consistency: Thin  Diet effective now              EDUCATION NEEDS:   No education needs have been identified at this time  Skin:  Skin Assessment: Reviewed RN Assessment  Last BM:  12/12 type 5  Height:   Ht Readings from Last 1 Encounters:  01/07/18 5\' 8"  (1.727 m)    Weight:   Wt Readings from Last 1 Encounters:  01/08/18 94.1 kg    Ideal Body Weight:  70 kg  BMI:  Body mass index is 31.54 kg/m.  Estimated Nutritional Needs:   Kcal:  1800-2000  Protein:  95-115 gm  Fluid:  1.8 L    Molli Barrows, RD, LDN, CNSC Pager [phone] After Hours Pager [phone]

## 2018-01-08 NOTE — Progress Notes (Signed)
PROGRESS NOTE    Phillip Wilson  YIR:[id number] DOB: 1945/10/14 DOA: 12/28/2017 PCP: Alycia Rossetti, MD    Brief Narrative:  72 y.o. male with past medical history significant for nonischemic cardiomyopathy (last echo demonstrating ejection fraction 15-20%; 2017), type 2 diabetes mellitus with nephropathy, chronic kidney disease is stage III, essential hypertension, gastroesophageal reflux disease hyperlipidemia and paroxysmal atrial fibrillation (on Savaysa); who presented to the emergency department with complaints of shortness of breath and worsening swelling.  Patient reports symptoms have been present for the last week or so and worsening in the last 3 days prior to admission. Patient expressed running out of his medication approximately 6-7 days prior to hospitalization and reported not the best compliance with low-sodium diet.  He was seen by primary care physician approximately about 6 days prior to this visit and at that time had an elevated BNP and was short of breath; patient was instructed to come to the hospital for admission and to receive acute diuresis; his medications were refilled and he will decide took 10 by mouth instead of being admitted at that time.  Symptoms failed to improve after restarting medication and he presented to the emergency department for further evaluation and management.  Patient reported orthopnea Patient denies chest pain, fever, chills, nausea/vomiting, abdominal pain, palpitations, headache, blurred vision, focal weakness, dysuria, hematuria, melena, hematochezia or any other acute complaints.  Patient continue having difficult to control A. fib with RVR, experienced acute respiratory failure with hypoxia and required intubation consult December 30, 2017.  After aggressive diuresis fluid overload status has not significantly improved but patient continued to have uncontrolled A. fib.  Cardiology is on board and assisting with management and has recommended  transfer to [facility] for TEE and EP evaluation.  01/08/2018: Patient seen.  No new complaints.  Patient is a poor historian.  Assessment & Plan: 1-acute respiratory failure with hypoxia in the setting of acute on chronic systolic heart failure and aspiration pneumonia -Patient required to be intubated 12/2-12/5 -Has completed antibiotic therapy using Unasyn and Augmentin -Currently afebrile and with normal WBCs -Overall breathing significantly improve and denying orthopnea -In the setting of acute on chronic renal failure diuretics will be placed on hold for now. -follow daily weights and low sodium diet.  01/08/2018: Continue to optimize heart failure treatment.  2-Acute on chronic systolic HF (heart failure) (HCC) -Patient ejection fraction 15 to 20% -Continue beta-blocker -No ACE/ARB in the setting of acute on chronic renal failure -Prior history of allergic reaction to [facility] -Cardiology is on board and will continue following rec's and guided treatment -will hold diuretics for now -Good urine output and overall great diuresis accomplished . -Patient will need evaluation by EP for ICD -Continue daily weights and strict intake and output.  3-A. fib with RVR -Despite correction predisposing/triggering causes, patient continued to be in A. fib with RVR. -Cardiology has recommended TEE with cardioversion -Will transfer to [facility] to accomplish this procedure and to have evaluation by EP -For now continue amiodarone drip and current dose of oral beta-blocker. -CHADSVASC score 4; continue edoxaban 01/08/2018: Likely TEE and cardioversion on Monday, 01/11/2018  4-Hyperlipidemia -continue statins  5-HTN (hypertension) -soft but stable and tolerating current dose of b-blocker  -Diuretics will be discontinued -patient needed transiently the use of pressors; will watch closely and if needed will resume for pressure support.  6-type 2 diabetes with  nephropathy -Continue sliding scale insulin and Lantus -Follow CBGs and further adjust hypoglycemic regimen as  needed. -last A1C 6.6 -not using any outpatient hypoglycemic regimen at this time.  7-Gastroesophageal reflux disease -continue PPI  8-acute kidney injury on stage 3 chronic kidney disease ([place]) -In the setting of diuresis, hypertension and concern for cardiorenal syndrome. -Stop diuretics -Follow trend closely -If needed get nephrology consult to see patient.  9-left hand gout flare -improving -continue prednisone, follow response and complete 5-7 days therapy.  10-hematuria -seen by urology, felt most likely irritation by foley, use of anticoagulation and underlying BPH. -recommendations given for outpatient work up -foley removed on 12/10 and patient able to urinate. -hematuria is almost resolved now.    DVT prophylaxis: edoxaban  Code Status: Full code Family Communication: No family at bedside. Disposition Plan: This will depend on hospital course Consultants:   Pulmonary service  Cardiology  EP  Procedures:   See below for x-ray reports  2D echo - Left ventricle: The cavity size was normal. Wall thickness was   increased in a pattern of mild LVH. Systolic function was   severely reduced. The estimated ejection fraction was in the   range of 15% to 20%. Diffuse hypokinesis. The study was not   technically sufficient to allow evaluation of LV diastolic   dysfunction due to atrial fibrillation. - Aortic valve: Mildly calcified annulus. Trileaflet. - Mitral valve: Mildly calcified annulus. There was mild   regurgitation. - Right ventricle: The cavity size was moderately dilated. Systolic   function was severely reduced. - Right atrium: The atrium was mildly dilated. - Tricuspid valve: There was mild regurgitation. Peak RV-RA   gradient (S): 36 mm Hg. - Pulmonary arteries: Systolic pressure could not be accurately   estimated. - Inferior vena cava:  The vessel was dilated. Patient on   ventilator. - Pericardium, extracardiac: A small pericardial effusion was   identified posterior to the heart.  Planning TEE and cardioversion  Antimicrobials:  Anti-infectives (From admission, onward)   Start     Dose/Rate Route Frequency Ordered Stop   01/02/18 2200  amoxicillin-clavulanate (AUGMENTIN) 875-125 MG per tablet 1 tablet  Status:  Discontinued     1 tablet Oral Every 12 hours 01/02/18 1025 01/05/18 1452   12/29/17 1600  Ampicillin-Sulbactam (UNASYN) 3 g in sodium chloride 0.9 % 100 mL IVPB     3 g 200 mL/hr over 30 Minutes Intravenous Every 8 hours 12/29/17 0950 01/02/18 1641   12/29/17 0200  Ampicillin-Sulbactam (UNASYN) 3 g in sodium chloride 0.9 % 100 mL IVPB  Status:  Discontinued     3 g 200 mL/hr over 30 Minutes Intravenous Every 6 hours 12/29/17 0145 12/29/17 0950      Subjective: Patient reports some improvement in his breathing.  Denies chest pain or palpitations at this time.  Expressed feeling weak, deconditioned and is still experiencing shortness of breath with minimal exertion.  He has been able to urinate after having Foley catheter removed.  Objective: Vitals:   01/08/18 1106 01/08/18 1715 01/08/18 1742 01/08/18 1853  BP: 91/78 (!) 79/69  114/65  Pulse: 90     Resp: 20     Temp:   (!) 97.5 F (36.4 C)   TempSrc:   Oral   SpO2: 100%  100%   Weight:      Height:        Intake/Output Summary (Last 24 hours) at 01/08/2018 1903 Last data filed at 01/08/2018 1800 Gross per 24 hour  Intake 1048.34 ml  Output 600 ml  Net 448.34 ml   [employer]  01/07/18 0436 01/07/18 1930 01/08/18 0429  Weight: 91.9 kg 94.3 kg 94.1 kg    Examination: General exam: Alert, awake, oriented x 3; seated on chair, reports improvement in his breathing, overall weakness, deconditioning and shortness of breath on exertion.  He denies chest pain or palpitation at this time. Respiratory system: Fine crackles at the bases, no  wheezing, no using accessory muscles. Cardiovascular system: Irregular irregular.  Soft murmur appreciated on exam, no rubs, no gallops, no JVD appreciated.  Gastrointestinal system: Abdomen is nondistended, soft and nontender. No organomegaly or masses felt. Normal bowel sounds heard. Central nervous system: Alert and oriented.  Moving 4 limbs spontaneously. Extremities: No cyanosis or clubbing; trace to 1+ edema appreciated bilaterally.  TED hoses in place. Skin: No rashes, lesions or ulcers Psychiatry: Judgement and insight appear normal. Mood & affect appropriate.   Data Reviewed: I have personally reviewed following labs and imaging studies  CBC: Recent Labs  Lab 01/03/18 0431 01/04/18 0435 01/05/18 0352 01/06/18 0408 01/07/18 0358  WBC 11.1* 12.6* 9.9 8.5 14.9*  HGB 15.3 13.9 13.4 15.4 15.2  HCT 49.4 44.6 43.3 49.2 48.3  MCV 86.7 83.4 83.1 84.1 82.4  PLT 74* 70* 63* 86* 791*   Basic Metabolic Panel: Recent Labs  Lab 01/05/18 0352 01/06/18 0408 01/07/18 0358 01/07/18 2209 01/08/18 1430  NA 142 141 139 137 137  K 3.5 4.0 4.1 4.3 4.4  CL 102 100 99 95* 95*  CO2 30 32 30 30 30   GLUCOSE 106* 156* 148* 164* 173*  BUN 29* 35* 53* 60* 59*  CREATININE 1.60* 1.74* 2.36* 2.71* 2.48*  CALCIUM 7.7* 7.8* 8.0* 8.0* 7.9*   GFR: Estimated Creatinine Clearance: 30.9 mL/min (A) (by C-G formula based on SCr of 2.48 mg/dL (H)).   Liver Function Tests: Recent Labs  Lab 01/04/18 0435  AST 24  ALT 17  ALKPHOS 102  BILITOT 1.3*  PROT 5.4*  ALBUMIN 2.2*   CBG: Recent Labs  Lab 01/07/18 1622 01/08/18 0038 01/08/18 0739 01/08/18 1125 01/08/18 1636  GLUCAP 185* 136* 101* 141* 145*   Urine analysis:    Component Value Date/Time   COLORURINE RED (A) 01/04/2018 1306   APPEARANCEUR TURBID (A) 01/04/2018 1306   LABSPEC  01/04/2018 1306    TEST NOT REPORTED DUE TO COLOR INTERFERENCE OF URINE PIGMENT   PHURINE  01/04/2018 1306    TEST NOT REPORTED DUE TO COLOR INTERFERENCE  OF URINE PIGMENT   GLUCOSEU (A) 01/04/2018 1306    TEST NOT REPORTED DUE TO COLOR INTERFERENCE OF URINE PIGMENT   HGBUR (A) 01/04/2018 1306    TEST NOT REPORTED DUE TO COLOR INTERFERENCE OF URINE PIGMENT   HGBUR negative 02/14/2008 1349   BILIRUBINUR (A) 01/04/2018 1306    TEST NOT REPORTED DUE TO COLOR INTERFERENCE OF URINE PIGMENT   KETONESUR (A) 01/04/2018 1306    TEST NOT REPORTED DUE TO COLOR INTERFERENCE OF URINE PIGMENT   PROTEINUR (A) 01/04/2018 1306    TEST NOT REPORTED DUE TO COLOR INTERFERENCE OF URINE PIGMENT   UROBILINOGEN 1.0 01/21/2011 1246   NITRITE (A) 01/04/2018 1306    TEST NOT REPORTED DUE TO COLOR INTERFERENCE OF URINE PIGMENT   LEUKOCYTESUR (A) 01/04/2018 1306    TEST NOT REPORTED DUE TO COLOR INTERFERENCE OF URINE PIGMENT    Recent Results (from the past 240 hour(s))  Culture, Urine     Status: None   Collection Time: 01/01/18  9:00 PM  Result Value Ref Range Status   Specimen Description  Final    URINE, CLEAN CATCH Performed at [facility], [address], [place], [place] [address]    Special Requests   Final    NONE Performed at [facility], [address], [place], [place] [address]    Culture   Final    NO GROWTH Performed at [facility], [place] [address], [place], [place] [address]    Report Status 01/03/2018 FINAL  Final  Culture, blood (Routine X 2) w Reflex to ID Panel     Status: None (Preliminary result)   Collection Time: 01/04/18 10:53 AM  Result Value Ref Range Status   Specimen Description LEFT ANTECUBITAL  Final   Special Requests   Final    BOTTLES DRAWN AEROBIC ONLY Blood Culture adequate volume   Culture   Final    NO GROWTH 4 DAYS Performed at [facility], [address], [place], [place] [address]    Report Status PENDING  Incomplete  Culture, blood (Routine X 2) w Reflex to ID Panel     Status: None (Preliminary result)   Collection Time: 01/04/18 11:00 AM  Result Value Ref Range Status   Specimen Description BLOOD  LEFT HAND  Final   Special Requests   Final    BOTTLES DRAWN AEROBIC ONLY Blood Culture adequate volume   Culture   Final    NO GROWTH 4 DAYS Performed at [facility], [address] [place] [address], [place], [place] [address]    Report Status PENDING  Incomplete  Culture, Urine     Status: None   Collection Time: 01/04/18  1:06 PM  Result Value Ref Range Status   Specimen Description   Final    URINE, CATHETERIZED Performed at [facility], [address] [place], [place] [address]    Special Requests   Final    NONE Performed at [facility], [address], [place], [place] [address]    Culture   Final    NO GROWTH Performed at [facility], [place] [address] [place], [place] [address]    Report Status 01/06/2018 FINAL  Final     Radiology Studies: No results found. Scheduled Meds: . aspirin  81 mg Oral Daily  . atorvastatin  40 mg Oral q1800  . Chlorhexidine Gluconate Cloth  6 each Topical Daily  . edoxaban  30 mg Oral Q24H  . feeding supplement (ENSURE ENLIVE)  237 mL Oral BID BM  . insulin aspart  0-9 Units Subcutaneous TID WC  . metoprolol succinate  25 mg Oral QPM  . metoprolol succinate  50 mg Oral q morning - 10a  . pantoprazole  40 mg Oral Daily  . potassium chloride  40 mEq Oral Daily  . predniSONE  30 mg Oral Q breakfast  . sodium chloride flush  10-40 mL Intracatheter Q12H  . sodium chloride flush  3 mL Intravenous Q12H   Continuous Infusions: . sodium chloride    . amiodarone 30 mg/hr (01/08/18 1018)     LOS: 11 days    Time spent: 35 minutes.  Greater than 50% of this time was spent in direct contact with the patient, coordinating care and discussing relevant ongoing clinical issues, including continue ongoing uncontrolled heart rate and rhythm; need for TEE with cardioversion to further stabilize his condition and explaining further recommendations by urology service regarding history of hematuria and urinary retention.  Case has been discussed with cardiology  in details and will follow further recommendations.  Patient continued to be critically ill  with guarded prognosis.  At this moment he has developed acute on chronic renal failure with concerns of ATN due to use of nephrotoxic agents and hypotension, versus worsening function in the setting of cardiorenal syndrome.   Bonnell Public, MD [facility] Pager [phone]  If 7PM-7AM, please contact night-coverage [email or web address] Password [email or web address] 01/08/2018, 7:03 PM

## 2018-01-08 NOTE — Progress Notes (Signed)
CSW spoke to [facility] regarding insurance auth for SNF. SNF will need updated PT/OT notes for insurance. Requested updated PT/OT orders from MD. CSW to follow and support with discharge planning.  [name], LCSW [phone]

## 2018-01-08 NOTE — Progress Notes (Signed)
Pt has a left facial droop that was seen on assessment earlier toay. Pt stated his face has had a droop for at least 40 years. Spoke with pt's brother on the phone who confirmed that pt's face has had a droop for a "long time". Pt denies feeling weaker on side. Pt able to call pt's brother on the phone with minimal assistance. Will cont to monitor pt.

## 2018-01-09 LAB — RENAL FUNCTION PANEL
Albumin: 2.2 g/dL — ABNORMAL LOW (ref 3.5–5.0)
Anion gap: 13 (ref 5–15)
BUN: 62 mg/dL — ABNORMAL HIGH (ref 8–23)
CO2: 30 mmol/L (ref 22–32)
Calcium: 7.9 mg/dL — ABNORMAL LOW (ref 8.9–10.3)
Chloride: 94 mmol/L — ABNORMAL LOW (ref 98–111)
Creatinine, Ser: 2.32 mg/dL — ABNORMAL HIGH (ref 0.61–1.24)
GFR calc Af Amer: 32 mL/min — ABNORMAL LOW (ref >60)
GFR calc non Af Amer: 27 mL/min — ABNORMAL LOW (ref >60)
Glucose, Bld: 124 mg/dL — ABNORMAL HIGH (ref 70–99)
Phosphorus: 3.2 mg/dL (ref 2.5–4.6)
Potassium: 4.2 mmol/L (ref 3.5–5.1)
Sodium: 137 mmol/L (ref 135–145)

## 2018-01-09 LAB — CBC WITH DIFFERENTIAL/PLATELET
Abs Immature Granulocytes: 0.03 10*3/uL (ref 0.00–0.07)
Basophils Absolute: 0 10*3/uL (ref 0.0–0.1)
Basophils Relative: 0 %
Eosinophils Absolute: 0 10*3/uL (ref 0.0–0.5)
Eosinophils Relative: 0 %
HCT: 43.9 % (ref 39.0–52.0)
Hemoglobin: 13.7 g/dL (ref 13.0–17.0)
Immature Granulocytes: 0 %
Lymphocytes Relative: 5 %
Lymphs Abs: 0.5 10*3/uL — ABNORMAL LOW (ref 0.7–4.0)
MCH: 25.4 pg — ABNORMAL LOW (ref 26.0–34.0)
MCHC: 31.2 g/dL (ref 30.0–36.0)
MCV: 81.4 fL (ref 80.0–100.0)
Monocytes Absolute: 0.5 10*3/uL (ref 0.1–1.0)
Monocytes Relative: 5 %
Neutro Abs: 8.6 10*3/uL — ABNORMAL HIGH (ref 1.7–7.7)
Neutrophils Relative %: 90 %
Platelets: 111 10*3/uL — ABNORMAL LOW (ref 150–400)
RBC: 5.39 MIL/uL (ref 4.22–5.81)
RDW: 15.6 % — ABNORMAL HIGH (ref 11.5–15.5)
WBC: 9.6 10*3/uL (ref 4.0–10.5)
nRBC: 0 % (ref 0.0–0.2)

## 2018-01-09 LAB — CULTURE, BLOOD (ROUTINE X 2)
CULTURE: NO GROWTH
Culture: NO GROWTH
Special Requests: ADEQUATE
Special Requests: ADEQUATE

## 2018-01-09 LAB — GLUCOSE, CAPILLARY
Glucose-Capillary: 142 mg/dL — ABNORMAL HIGH (ref 70–99)
Glucose-Capillary: 149 mg/dL — ABNORMAL HIGH (ref 70–99)
Glucose-Capillary: 129 mg/dL — ABNORMAL HIGH (ref 70–99)
Glucose-Capillary: 143 mg/dL — ABNORMAL HIGH (ref 70–99)

## 2018-01-09 LAB — MAGNESIUM: Magnesium: 2.2 mg/dL (ref 1.7–2.4)

## 2018-01-09 NOTE — Progress Notes (Signed)
Progress Note  Patient Name: Phillip Wilson Date of Encounter: 01/09/2018  Primary Cardiologist: Kate Sable, MD    Subjective   72 year old gentleman with a history of a nonischemic cardiomyopathy, hypertension, diabetes mellitus, chronic kidney disease, hyperlipidemia and medication noncompliance.  He has a history of atrial fibrillation.  He was seen yesterday in consultation for further evaluation of his atrial fibrillation.  Patient was admitted to the hospital at [facility] approximately 11 days ago with increasing shortness of breath and leg swelling.  He had run out of his medications for about a week.  He was in rapid atrial fibrillation, acute heart failure. Respiratory status worsened and he was ultimately intubated. He was transferred from [facility] to [facility] several days ago.  He has diuresed 5.5 liters since his admission.  Inpatient Medications    Scheduled Meds: . aspirin  81 mg Oral Daily  . atorvastatin  40 mg Oral q1800  . Chlorhexidine Gluconate Cloth  6 each Topical Daily  . edoxaban  30 mg Oral Q24H  . feeding supplement (ENSURE ENLIVE)  237 mL Oral BID BM  . insulin aspart  0-9 Units Subcutaneous TID WC  . metoprolol succinate  25 mg Oral QPM  . metoprolol succinate  50 mg Oral q morning - 10a  . pantoprazole  40 mg Oral Daily  . potassium chloride  40 mEq Oral Daily  . predniSONE  30 mg Oral Q breakfast  . sodium chloride flush  10-40 mL Intracatheter Q12H  . sodium chloride flush  3 mL Intravenous Q12H   Continuous Infusions: . sodium chloride    . amiodarone 30 mg/hr (01/09/18 0925)   PRN Meds: acetaminophen, fentaNYL (SUBLIMAZE) injection, fentaNYL (SUBLIMAZE) injection, levalbuterol, morphine injection, ondansetron (ZOFRAN) IV, sodium chloride, sodium chloride flush, sodium chloride flush   Vital Signs    Vitals:   01/09/18 0100 01/09/18 0517 01/09/18 0825 01/09/18 0915  BP: 95/68 107/87 116/82 (!) 82/65    Pulse: 90 97  98  Resp: 14     Temp:  98 F (36.7 C) (!) 97.4 F (36.3 C)   TempSrc:  Oral Axillary   SpO2: 98% 96% 95%   Weight:  95.3 kg    Height:        Intake/Output Summary (Last 24 hours) at 01/09/2018 4132 Last data filed at 01/09/2018 0025 Gross per 24 hour  Intake 960 ml  Output 250 ml  Net 710 ml   Filed Weights   01/07/18 1930 01/08/18 0429 01/09/18 0517  Weight: 94.3 kg 94.1 kg 95.3 kg    Telemetry    Atrial fibrillation with a controlled ventricular response- Personally Reviewed  ECG      Physical Exam   GEN:  Elderly gentleman, no acute distress dressed Neck: No JVD Cardiac:  Regularly irregular Respiratory:  Lungs are clear. GI: Soft, nontender, non-distended  MS:  1-2+ bilateral pitting edema. Neuro:  Nonfocal  Psych: Normal affect   Labs    Chemistry Recent Labs  Lab 01/04/18 0435  01/07/18 2209 01/08/18 1430 01/09/18 0511  NA 143   < > 137 137 137  K 3.4*   < > 4.3 4.4 4.2  CL 103   < > 95* 95* 94*  CO2 30   < > 30 30 30   GLUCOSE 123*   < > 164* 173* 124*  BUN 25*   < > 60* 59* 62*  CREATININE 1.53*   < > 2.71* 2.48* 2.32*  CALCIUM 8.0*   < >  8.0* 7.9* 7.9*  PROT 5.4*  --   --   --   --   ALBUMIN 2.2*  --   --   --  2.2*  AST 24  --   --   --   --   ALT 17  --   --   --   --   ALKPHOS 102  --   --   --   --   BILITOT 1.3*  --   --   --   --   GFRNONAA 45*   < > 23* 25* 27*  GFRAA 53*   < > 26* 29* 32*  ANIONGAP 10   < > 12 12 13    < > = values in this interval not displayed.     Hematology Recent Labs  Lab 01/06/18 0408 01/07/18 0358 01/09/18 0500  WBC 8.5 14.9* 9.6  RBC 5.85* 5.86* 5.39  HGB 15.4 15.2 13.7  HCT 49.2 48.3 43.9  MCV 84.1 82.4 81.4  MCH 26.3 25.9* 25.4*  MCHC 31.3 31.5 31.2  RDW 15.6* 16.8* 15.6*  PLT 86* 108* 111*    Cardiac EnzymesNo results for input(s): TROPONINI in the last 168 hours. No results for input(s): TROPIPOC in the last 168 hours.   BNPNo results for input(s): BNP, PROBNP in  the last 168 hours.   DDimer No results for input(s): DDIMER in the last 168 hours.   Radiology    No results found.  Cardiac Studies      Patient Profile     72 y.o. male with a history of chronic systolic and diastolic congestive heart failure and atrial fibrillation.  He was admitted with respiratory distress and rapid atrial fibrillation.  Assessment & Plan    1.  Atrial fibrillation: The patient's rate is much better controlled.  He is scheduled for a TEE/cardioversion on Monday. Continue Sayaysa 60 mg a day   2.  Acute on chronic systolic congestive heart failure: Continue diuresis.  Creatinine has stabilized and is 2.32 today..         For questions or updates, please contact [place] Please consult [email or web address] for contact info under        Signed, Mertie Moores, MD  01/09/2018, 9:52 AM

## 2018-01-09 NOTE — Progress Notes (Signed)
BP 82//65 and 91/71.  Metoprolol 5omg held.  MD notified.

## 2018-01-09 NOTE — Progress Notes (Signed)
Physical Therapy Treatment Patient Details Name: Phillip Wilson MRN: [id number] DOB: 1945/10/19 Today's Date: 01/09/2018    History of Present Illness Pt is a 72 y.o. male admitted to [facility] 12/28/17 with worsening SOB an swelling, recently running out of his medication. Worked up for acute respiratory failure secondary to HF and aspiration pneumonia. Hospital course complicated by a-fib with RVR and hypxia; ETT 12/4-12/5. Likely TEE and cardioversion 12/16. PMH includes HTN, CHF, CKD3, DJD, DM2, GERD.   PT Comments    Pt progressing with mobility. Able to perform multiple sit<>stand transfers and ambulate short bouts using RW with min guard to minA for balance. Pt very motivated to participate and return to PLOF; remains agreeable to SNF. Likely plan for TEE/DCCV on Monday 12/16. Will continue to follow acutely.  SpO2 92-94% on RA  HR 107-148 with mobility  Seated BP 91/71 Standing BP 116/82 Post-mobility BP 118/102   Follow Up Recommendations  SNF;Supervision for mobility/OOB     Equipment Recommendations  None recommended by PT    Recommendations for Other Services       Precautions / Restrictions Precautions Precautions: Fall Precaution Comments: Watch HR Restrictions Weight Bearing Restrictions: No    Mobility  Bed Mobility               General bed mobility comments: Received sitting in recliner  Transfers Overall transfer level: Needs assistance Equipment used: Rolling walker (2 wheeled) Transfers: Sit to/from Stand   Stand pivot transfers: Min assist;Min guard       General transfer comment: Initial minA to assist trunk elevation as pt pulling on RW requiring stabilization. Cues for UE to push from chair, able to stand 4-5x more during session with min guard, no physical assist required; increased time and effort, heavy reliance on UE support  Ambulation/Gait Ambulation/Gait assistance: Min guard Gait Distance (Feet): 40 Feet Assistive device:  Rolling walker (2 wheeled) Gait Pattern/deviations: Step-to pattern;Shuffle;Trunk flexed Gait velocity: Decreased Gait velocity interpretation: <1.31 ft/sec, indicative of household ambulator General Gait Details: Amb 16' forwards and backwards + seated rest + 12' forwards/backwards + seated rest + 12' forwards backwards; close min guard for balance; slow, steady, slightly shuffling amb with RW. Self-corrected bilat knee instability   Stairs             Wheelchair Mobility    Modified Rankin (Stroke Patients Only)       Balance Overall balance assessment: Needs assistance Sitting-balance support: Feet supported;No upper extremity supported Sitting balance-Leahy Scale: Good     Standing balance support: During functional activity;Bilateral upper extremity supported Standing balance-Leahy Scale: Poor Standing balance comment: Reliant on UE support                            Cognition Arousal/Alertness: Awake/alert Behavior During Therapy: WFL for tasks assessed/performed Overall Cognitive Status: Within Functional Limits for tasks assessed                                 General Comments: WFL for simple tasks; likely baseline cognition      Exercises      General Comments General comments (skin integrity, edema, etc.): SpO2 92-96% on RA. HR 107-148 a-fib      Pertinent Vitals/Pain Pain Assessment: No/denies pain    Home Living  Prior Function            PT Goals (current goals can now be found in the care plan section) Acute Rehab PT Goals Patient Stated Goal: Get stronger at SNF and return home PT Goal Formulation: With patient Time For Goal Achievement: 01/15/18 Potential to Achieve Goals: Good Progress towards PT goals: Progressing toward goals    Frequency    Min 2X/week      PT Plan Frequency needs to be updated    Co-evaluation              AM-PAC PT "6 Clicks" Mobility    Outcome Measure  Help needed turning from your back to your side while in a flat bed without using bedrails?: A Little Help needed moving from lying on your back to sitting on the side of a flat bed without using bedrails?: A Little Help needed moving to and from a bed to a chair (including a wheelchair)?: A Little Help needed standing up from a chair using your arms (e.g., wheelchair or bedside chair)?: A Little Help needed to walk in hospital room?: A Little Help needed climbing 3-5 steps with a railing? : A Lot 6 Click Score: 17    End of Session Equipment Utilized During Treatment: Gait belt Activity Tolerance: Patient tolerated treatment well Patient left: in chair;with call bell/phone within reach;with chair alarm set Nurse Communication: Mobility status PT Visit Diagnosis: Unsteadiness on feet (R26.81);Other abnormalities of gait and mobility (R26.89);Muscle weakness (generalized) (M62.81)     Time: 8182-9937 PT Time Calculation (min) (ACUTE ONLY): 24 min  Charges:  $Therapeutic Exercise: 8-22 mins $Therapeutic Activity: 8-22 mins                    Mabeline Caras, PT, DPT Acute Rehabilitation Services  Pager [phone] Office [place] 01/09/2018, 8:57 AM

## 2018-01-09 NOTE — Progress Notes (Signed)
PROGRESS NOTE    Phillip Wilson  XTG:[id number] DOB: 1945/07/01 DOA: 12/28/2017 PCP: Alycia Rossetti, MD    Brief Narrative:  72 y.o. male with past medical history significant for nonischemic cardiomyopathy (last echo demonstrating ejection fraction 15-20%; 2017), type 2 diabetes mellitus with nephropathy, chronic kidney disease is stage III, essential hypertension, gastroesophageal reflux disease hyperlipidemia and paroxysmal atrial fibrillation (on [place]); who presented to the emergency department with complaints of shortness of breath and worsening swelling.  Patient reports symptoms have been present for the last week or so and worsening in the last 3 days prior to admission. Patient expressed running out of his medication approximately 6-7 days prior to hospitalization and reported not the best compliance with low-sodium diet.  He was seen by primary care physician approximately about 6 days prior to this visit and at that time had an elevated BNP and was short of breath; patient was instructed to come to the hospital for admission and to receive acute diuresis; his medications were refilled and he will decide took 10 by mouth instead of being admitted at that time.  Symptoms failed to improve after restarting medication and he presented to the emergency department for further evaluation and management.  Patient reported orthopnea Patient denies chest pain, fever, chills, nausea/vomiting, abdominal pain, palpitations, headache, blurred vision, focal weakness, dysuria, hematuria, melena, hematochezia or any other acute complaints.  Patient continue having difficult to control A. fib with RVR, experienced acute respiratory failure with hypoxia and required intubation consult December 30, 2017.  After aggressive diuresis fluid overload status has not significantly improved but patient continued to have uncontrolled A. fib.  Cardiology is on board and assisting with management and has recommended  transfer to [facility] for TEE and EP evaluation.  01/09/2018: Patient seen.  No new complaints.  Patient remains stable.  For TEE and cardioversion on Monday, 01/11/2018.  Assessment & Plan: 1-acute respiratory failure with hypoxia in the setting of acute on chronic systolic heart failure and aspiration pneumonia -Patient required to be intubated 12/2-12/5 -Has completed antibiotic therapy using Unasyn and Augmentin -Currently afebrile and with normal WBCs -Overall breathing significantly improve and denying orthopnea -In the setting of acute on chronic renal failure diuretics will be placed on hold for now. -follow daily weights and low sodium diet.  01/09/2018: Continue to optimize heart failure treatment.  2-Acute on chronic systolic HF (heart failure) (HCC) -Patient ejection fraction 15 to 20% -Continue beta-blocker -No ACE/ARB in the setting of acute on chronic renal failure -Prior history of allergic reaction to [facility] -Cardiology is on board and will continue following rec's and guided treatment -will hold diuretics for now -Good urine output and overall great diuresis accomplished . -Patient will need evaluation by EP for ICD -Continue daily weights and strict intake and output.  3-A. fib with RVR -Despite correction predisposing/triggering causes, patient continued to be in A. fib with RVR. -Cardiology has recommended TEE with cardioversion -Will transfer to [facility] to accomplish this procedure and to have evaluation by EP -For now continue amiodarone drip and current dose of oral beta-blocker. -CHADSVASC score 4; continue edoxaban 01/09/2018: Likely TEE and cardioversion on Monday, 01/11/2018  4-Hyperlipidemia -continue statins  5-HTN (hypertension) -soft but stable and tolerating current dose of b-blocker  -Diuretics will be discontinued -patient needed transiently the use of pressors; will watch closely and if needed will resume for pressure  support.  6-type 2 diabetes with nephropathy -Continue sliding scale insulin and Lantus -Follow CBGs  and further adjust hypoglycemic regimen as needed. -last A1C 6.6 -not using any outpatient hypoglycemic regimen at this time.  7-Gastroesophageal reflux disease -continue PPI  8-acute kidney injury on stage 3 chronic kidney disease ([place]) -In the setting of diuresis, hypertension and concern for cardiorenal syndrome. -Stop diuretics -Follow trend closely -If needed get nephrology consult to see patient.  9-left hand gout flare -improving -continue prednisone, follow response and complete 5-7 days therapy.  10-hematuria -seen by urology, felt most likely irritation by foley, use of anticoagulation and underlying BPH. -recommendations given for outpatient work up -foley removed on 12/10 and patient able to urinate. -hematuria is almost resolved now.    DVT prophylaxis: edoxaban  Code Status: Full code Family Communication: No family at bedside. Disposition Plan: This will depend on hospital course Consultants:   Pulmonary service  Cardiology  EP  Procedures:   See below for x-ray reports  2D echo - Left ventricle: The cavity size was normal. Wall thickness was   increased in a pattern of mild LVH. Systolic function was   severely reduced. The estimated ejection fraction was in the   range of 15% to 20%. Diffuse hypokinesis. The study was not   technically sufficient to allow evaluation of LV diastolic   dysfunction due to atrial fibrillation. - Aortic valve: Mildly calcified annulus. Trileaflet. - Mitral valve: Mildly calcified annulus. There was mild   regurgitation. - Right ventricle: The cavity size was moderately dilated. Systolic   function was severely reduced. - Right atrium: The atrium was mildly dilated. - Tricuspid valve: There was mild regurgitation. Peak RV-RA   gradient (S): 36 mm Hg. - Pulmonary arteries: Systolic pressure could not be accurately    estimated. - Inferior vena cava: The vessel was dilated. Patient on   ventilator. - Pericardium, extracardiac: A small pericardial effusion was   identified posterior to the heart.  Planning TEE and cardioversion  Antimicrobials:  Anti-infectives (From admission, onward)   Start     Dose/Rate Route Frequency Ordered Stop   01/02/18 2200  amoxicillin-clavulanate (AUGMENTIN) 875-125 MG per tablet 1 tablet  Status:  Discontinued     1 tablet Oral Every 12 hours 01/02/18 1025 01/05/18 1452   12/29/17 1600  Ampicillin-Sulbactam (UNASYN) 3 g in sodium chloride 0.9 % 100 mL IVPB     3 g 200 mL/hr over 30 Minutes Intravenous Every 8 hours 12/29/17 0950 01/02/18 1641   12/29/17 0200  Ampicillin-Sulbactam (UNASYN) 3 g in sodium chloride 0.9 % 100 mL IVPB  Status:  Discontinued     3 g 200 mL/hr over 30 Minutes Intravenous Every 6 hours 12/29/17 0145 12/29/17 0950      Subjective: No new complaints. No fever or chills. No worsening shortness of breath No chest pain.  Objective: Vitals:   01/09/18 0100 01/09/18 0517 01/09/18 0825 01/09/18 0915  BP: 95/68 107/87 116/82 (!) 82/65  Pulse: 90 97  98  Resp: 14     Temp:  98 F (36.7 C) (!) 97.4 F (36.3 C)   TempSrc:  Oral Axillary   SpO2: 98% 96% 95%   Weight:  95.3 kg    Height:        Intake/Output Summary (Last 24 hours) at 01/09/2018 1808 Last data filed at 01/09/2018 1351 Gross per 24 hour  Intake 360 ml  Output 500 ml  Net -140 ml   Filed Weights   01/07/18 1930 01/08/18 0429 01/09/18 0517  Weight: 94.3 kg 94.1 kg 95.3 kg  Examination: General exam: Alert, awake, oriented x 3; seated on chair, reports improvement in his breathing, overall weakness, deconditioning and shortness of breath on exertion.  He denies chest pain or palpitation at this time. Respiratory system: Fine crackles at the bases, no wheezing, no using accessory muscles. Cardiovascular system: Irregular irregular.  Soft murmur appreciated on exam, no  rubs, no gallops, no JVD appreciated.  Gastrointestinal system: Abdomen is nondistended, soft and nontender. No organomegaly or masses felt. Normal bowel sounds heard. Central nervous system: Alert and oriented.  Moving 4 limbs spontaneously. Extremities: No cyanosis or clubbing; trace to 1+ edema appreciated bilaterally.  TED hoses in place. Skin: No rashes, lesions or ulcers Psychiatry: Judgement and insight appear normal. Mood & affect appropriate.   Data Reviewed: I have personally reviewed following labs and imaging studies  CBC: Recent Labs  Lab 01/04/18 0435 01/05/18 0352 01/06/18 0408 01/07/18 0358 01/09/18 0500  WBC 12.6* 9.9 8.5 14.9* 9.6  NEUTROABS  --   --   --   --  8.6*  HGB 13.9 13.4 15.4 15.2 13.7  HCT 44.6 43.3 49.2 48.3 43.9  MCV 83.4 83.1 84.1 82.4 81.4  PLT 70* 63* 86* 108* 242*   Basic Metabolic Panel: Recent Labs  Lab 01/06/18 0408 01/07/18 0358 01/07/18 2209 01/08/18 1430 01/09/18 0511  NA 141 139 137 137 137  K 4.0 4.1 4.3 4.4 4.2  CL 100 99 95* 95* 94*  CO2 32 30 30 30 30   GLUCOSE 156* 148* 164* 173* 124*  BUN 35* 53* 60* 59* 62*  CREATININE 1.74* 2.36* 2.71* 2.48* 2.32*  CALCIUM 7.8* 8.0* 8.0* 7.9* 7.9*  MG  --   --   --   --  2.2  PHOS  --   --   --   --  3.2   GFR: Estimated Creatinine Clearance: 33.2 mL/min (A) (by C-G formula based on SCr of 2.32 mg/dL (H)).   Liver Function Tests: Recent Labs  Lab 01/04/18 0435 01/09/18 0511  AST 24  --   ALT 17  --   ALKPHOS 102  --   BILITOT 1.3*  --   PROT 5.4*  --   ALBUMIN 2.2* 2.2*   CBG: Recent Labs  Lab 01/08/18 1636 01/08/18 2130 01/09/18 0734 01/09/18 1101 01/09/18 1626  GLUCAP 145* 182* 142* 149* 129*   Urine analysis:    Component Value Date/Time   COLORURINE RED (A) 01/04/2018 1306   APPEARANCEUR TURBID (A) 01/04/2018 1306   LABSPEC  01/04/2018 1306    TEST NOT REPORTED DUE TO COLOR INTERFERENCE OF URINE PIGMENT   PHURINE  01/04/2018 1306    TEST NOT REPORTED DUE  TO COLOR INTERFERENCE OF URINE PIGMENT   GLUCOSEU (A) 01/04/2018 1306    TEST NOT REPORTED DUE TO COLOR INTERFERENCE OF URINE PIGMENT   HGBUR (A) 01/04/2018 1306    TEST NOT REPORTED DUE TO COLOR INTERFERENCE OF URINE PIGMENT   HGBUR negative 02/14/2008 1349   BILIRUBINUR (A) 01/04/2018 1306    TEST NOT REPORTED DUE TO COLOR INTERFERENCE OF URINE PIGMENT   KETONESUR (A) 01/04/2018 1306    TEST NOT REPORTED DUE TO COLOR INTERFERENCE OF URINE PIGMENT   PROTEINUR (A) 01/04/2018 1306    TEST NOT REPORTED DUE TO COLOR INTERFERENCE OF URINE PIGMENT   UROBILINOGEN 1.0 01/21/2011 1246   NITRITE (A) 01/04/2018 1306    TEST NOT REPORTED DUE TO COLOR INTERFERENCE OF URINE PIGMENT   LEUKOCYTESUR (A) 01/04/2018 1306    TEST  NOT REPORTED DUE TO COLOR INTERFERENCE OF URINE PIGMENT    Recent Results (from the past 240 hour(s))  Culture, Urine     Status: None   Collection Time: 01/01/18  9:00 PM  Result Value Ref Range Status   Specimen Description   Final    URINE, CLEAN CATCH Performed at [facility], [address], [place], [place] [address]    Special Requests   Final    NONE Performed at [facility], [address] [place], [place] [address]    Culture   Final    NO GROWTH Performed at [facility], [place] [address], [place], [place] [address]    Report Status 01/03/2018 FINAL  Final  Culture, blood (Routine X 2) w Reflex to ID Panel     Status: None   Collection Time: 01/04/18 10:53 AM  Result Value Ref Range Status   Specimen Description LEFT ANTECUBITAL  Final   Special Requests   Final    BOTTLES DRAWN AEROBIC ONLY Blood Culture adequate volume   Culture   Final    NO GROWTH 5 DAYS Performed at [facility], [address], [place], [place] [address]    Report Status 01/09/2018 FINAL  Final  Culture, blood (Routine X 2) w Reflex to ID Panel     Status: None   Collection Time: 01/04/18 11:00 AM  Result Value Ref Range Status   Specimen Description BLOOD LEFT HAND  Final    Special Requests   Final    BOTTLES DRAWN AEROBIC ONLY Blood Culture adequate volume   Culture   Final    NO GROWTH 5 DAYS Performed at [facility], [address] [place], [place] [address]    Report Status 01/09/2018 FINAL  Final  Culture, Urine     Status: None   Collection Time: 01/04/18  1:06 PM  Result Value Ref Range Status   Specimen Description   Final    URINE, CATHETERIZED Performed at [facility], [address], [place], [place] [address]    Special Requests   Final    NONE Performed at [facility], [address], [place], [place] [address]    Culture   Final    NO GROWTH Performed at [facility], [place] [address] [place],  [address]    Report Status 01/06/2018 FINAL  Final     Radiology Studies: No results found. Scheduled Meds: . aspirin  81 mg Oral Daily  . atorvastatin  40 mg Oral q1800  . Chlorhexidine Gluconate Cloth  6 each Topical Daily  . edoxaban  30 mg Oral Q24H  . feeding supplement (ENSURE ENLIVE)  237 mL Oral BID BM  . insulin aspart  0-9 Units Subcutaneous TID WC  . metoprolol succinate  25 mg Oral QPM  . metoprolol succinate  50 mg Oral q morning - 10a  . pantoprazole  40 mg Oral Daily  . potassium chloride  40 mEq Oral Daily  . predniSONE  30 mg Oral Q breakfast  . sodium chloride flush  10-40 mL Intracatheter Q12H  . sodium chloride flush  3 mL Intravenous Q12H   Continuous Infusions: . sodium chloride    . amiodarone 30 mg/hr (01/09/18 0925)     LOS: 12 days    Time spent: 35 minutes.  Greater than 50% of this time was spent in direct contact with the patient, coordinating care and discussing relevant ongoing clinical issues, including continue ongoing uncontrolled heart rate and rhythm;  need for TEE with cardioversion to further stabilize his condition and explaining further recommendations by urology service regarding history of hematuria and urinary retention.  Case has been discussed with cardiology in details and  will follow further recommendations.  Patient continued to be critically ill with guarded prognosis.  At this moment he has developed acute on chronic renal failure with concerns of ATN due to use of nephrotoxic agents and hypotension, versus worsening function in the setting of cardiorenal syndrome.   Bonnell Public, MD [facility] Pager [phone]  If 7PM-7AM, please contact night-coverage [email or web address] Password [facility] 01/09/2018, 6:08 PM

## 2018-01-10 LAB — RENAL FUNCTION PANEL
Albumin: 2 g/dL — ABNORMAL LOW (ref 3.5–5.0)
Anion gap: 10 (ref 5–15)
BUN: 59 mg/dL — ABNORMAL HIGH (ref 8–23)
CO2: 31 mmol/L (ref 22–32)
Calcium: 8 mg/dL — ABNORMAL LOW (ref 8.9–10.3)
Chloride: 96 mmol/L — ABNORMAL LOW (ref 98–111)
Creatinine, Ser: 2.17 mg/dL — ABNORMAL HIGH (ref 0.61–1.24)
GFR calc Af Amer: 34 mL/min — ABNORMAL LOW (ref >60)
GFR calc non Af Amer: 30 mL/min — ABNORMAL LOW (ref >60)
Glucose, Bld: 128 mg/dL — ABNORMAL HIGH (ref 70–99)
Phosphorus: 2.5 mg/dL (ref 2.5–4.6)
Potassium: 4.2 mmol/L (ref 3.5–5.1)
Sodium: 137 mmol/L (ref 135–145)

## 2018-01-10 LAB — CBC WITH DIFFERENTIAL/PLATELET
Abs Immature Granulocytes: 0.03 10*3/uL (ref 0.00–0.07)
Basophils Absolute: 0 10*3/uL (ref 0.0–0.1)
Basophils Relative: 0 %
Eosinophils Absolute: 0 10*3/uL (ref 0.0–0.5)
Eosinophils Relative: 0 %
HCT: 42.1 % (ref 39.0–52.0)
Hemoglobin: 13.3 g/dL (ref 13.0–17.0)
Immature Granulocytes: 0 %
Lymphocytes Relative: 5 %
Lymphs Abs: 0.6 10*3/uL — ABNORMAL LOW (ref 0.7–4.0)
MCH: 25.8 pg — ABNORMAL LOW (ref 26.0–34.0)
MCHC: 31.6 g/dL (ref 30.0–36.0)
MCV: 81.6 fL (ref 80.0–100.0)
Monocytes Absolute: 0.8 10*3/uL (ref 0.1–1.0)
Monocytes Relative: 8 %
Neutro Abs: 9.1 10*3/uL — ABNORMAL HIGH (ref 1.7–7.7)
Neutrophils Relative %: 87 %
Platelets: 113 10*3/uL — ABNORMAL LOW (ref 150–400)
RBC: 5.16 MIL/uL (ref 4.22–5.81)
RDW: 15.3 % (ref 11.5–15.5)
WBC: 10.4 10*3/uL (ref 4.0–10.5)
nRBC: 0 % (ref 0.0–0.2)

## 2018-01-10 LAB — GLUCOSE, CAPILLARY
Glucose-Capillary: 142 mg/dL — ABNORMAL HIGH (ref 70–99)
Glucose-Capillary: 144 mg/dL — ABNORMAL HIGH (ref 70–99)

## 2018-01-10 LAB — MAGNESIUM: Magnesium: 2.2 mg/dL (ref 1.7–2.4)
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
## 2018-01-10 NOTE — Evaluation (Signed)
Occupational Therapy Evaluation Patient Details Name: Phillip Wilson MRN: [id number] DOB: 02-17-1945 Today's Date: 01/10/2018    History of Present Illness Pt is a 72 y.o. male admitted to [facility] 12/28/17 with worsening SOB an swelling, recently running out of his medication. Worked up for acute respiratory failure secondary to HF and aspiration pneumonia. Hospital course complicated by a-fib with RVR and hypxia; ETT 12/4-12/5. Likely TEE and cardioversion 12/16. PMH includes HTN, CHF, CKD3, DJD, DM2, GERD.   Clinical Impression   PTA patient reports independent, working and driving.  Currently admitted for above and limited by problem list below, including impaired balance, generalized weakness, decreased activity tolerance.  Patient requires mod assist for LB ADLs, setup assist for UB ADLs and min assist for transfers using RW.  Session limited by a-fib, HR increasing to 151 during session with standing at EOB and taking a few steps.  Patient is highly motivated to return home, and eager to participate in SNF rehab to regain strength.  Will continue to follow while admitted.     Follow Up Recommendations  SNF;Supervision/Assistance - 24 hour    Equipment Recommendations  Other (comment)(TBD at next venue of care)    Recommendations for Other Services       Precautions / Restrictions Precautions Precautions: Fall Precaution Comments: Watch HR Restrictions Weight Bearing Restrictions: No      Mobility Bed Mobility Overal bed mobility: Needs Assistance Bed Mobility: Supine to Sit;Sit to Supine     Supine to sit: Supervision Sit to supine: Supervision   General bed mobility comments: supervision for safety  Transfers Overall transfer level: Needs assistance Equipment used: Rolling walker (2 wheeled) Transfers: Sit to/from Stand Sit to Stand: Min assist         General transfer comment: min assist to min guard assist with cueing for hand placement and safety    Balance  Overall balance assessment: Needs assistance Sitting-balance support: Feet supported;No upper extremity supported Sitting balance-Leahy Scale: Good     Standing balance support: During functional activity;Bilateral upper extremity supported Standing balance-Leahy Scale: Poor Standing balance comment: Reliant on UE support                           ADL either performed or assessed with clinical judgement   ADL Overall ADL's : Needs assistance/impaired     Grooming: Set up;Sitting   Upper Body Bathing: Set up;Sitting   Lower Body Bathing: Moderate assistance;Sit to/from stand   Upper Body Dressing : Set up;Sitting   Lower Body Dressing: Moderate assistance;Sit to/from stand   Toilet Transfer: Minimal assistance;Ambulation;RW Toilet Transfer Details (indicate cue type and reason): simulated in room Toileting- Clothing Manipulation and Hygiene: Min guard;Sit to/from stand       Functional mobility during ADLs: Minimal assistance;Rolling walker General ADL Comments: patient limited by increased HR throughout session, EOB standing and short distance mobility with RW increased HR to 151. RN aware     Vision Patient Visual Report: No change from baseline Vision Assessment?: No apparent visual deficits     Perception     Praxis      Pertinent Vitals/Pain Pain Assessment: No/denies pain     Hand Dominance Right   Extremity/Trunk Assessment Upper Extremity Assessment Upper Extremity Assessment: Generalized weakness   Lower Extremity Assessment Lower Extremity Assessment: Defer to PT evaluation   Cervical / Trunk Assessment Cervical / Trunk Assessment: Normal   Communication Communication Communication: No difficulties   Cognition Arousal/Alertness: Awake/alert  Behavior During Therapy: WFL for tasks assessed/performed Overall Cognitive Status: Within Functional Limits for tasks assessed                                 General Comments:  WFL for simple tasks; likely baseline cognition   General Comments  HR increased to 151 during session, RN aware     Exercises     Shoulder Instructions      Home Living Family/patient expects to be discharged to:: Private residence Living Arrangements: Alone Available Help at Discharge: Neighbor Type of Home: Mobile home Home Access: Level entry     Home Layout: One level     Bathroom Shower/Tub: Occupational psychologist: Standard Bathroom Accessibility: Yes   Home Equipment: Environmental consultant - 2 wheels;Cane - single point;Bedside commode;Shower seat          Prior Functioning/Environment Level of Independence: Independent        Comments: independent, working, driving         OT Problem List: Decreased strength;Decreased activity tolerance;Impaired balance (sitting and/or standing);Decreased safety awareness;Decreased knowledge of use of DME or AE;Cardiopulmonary status limiting activity      OT Treatment/Interventions: Self-care/ADL training;Therapeutic exercise;DME and/or AE instruction;Energy conservation;Therapeutic activities;Patient/family education;Balance training    OT Goals(Current goals can be found in the care plan section) Acute Rehab OT Goals Patient Stated Goal: Get stronger at SNF and return home OT Goal Formulation: With patient Time For Goal Achievement: 01/24/18 Potential to Achieve Goals: Good  OT Frequency: Min 2X/week   Barriers to D/C:            Co-evaluation              AM-PAC OT "6 Clicks" Daily Activity     Outcome Measure Help from another person eating meals?: None Help from another person taking care of personal grooming?: None(seated) Help from another person toileting, which includes using toliet, bedpan, or urinal?: A Little Help from another person bathing (including washing, rinsing, drying)?: A Lot Help from another person to put on and taking off regular upper body clothing?: None Help from another person to  put on and taking off regular lower body clothing?: A Lot 6 Click Score: 19   End of Session Equipment Utilized During Treatment: Gait belt;Rolling walker Nurse Communication: Mobility status;Other (comment)(HR)  Activity Tolerance: Patient tolerated treatment well Patient left: in bed;with call bell/phone within reach;with bed alarm set  OT Visit Diagnosis: Other abnormalities of gait and mobility (R26.89);Muscle weakness (generalized) (M62.81)                Time: 1610-9604 OT Time Calculation (min): 26 min Charges:  OT General Charges $OT Visit: 1 Visit OT Evaluation $OT Eval Moderate Complexity: 1 Mod OT Treatments $Self Care/Home Management : 8-22 mins  Delight Stare, OT Acute Rehabilitation Services Pager [phone] Office [phone]   Delight Stare 01/10/2018, 4:51 PM

## 2018-01-10 NOTE — Progress Notes (Signed)
Progress Note  Patient Name: Phillip Wilson Date of Encounter: 01/10/2018  Primary Cardiologist: Kate Sable, MD    Subjective   72 year old gentleman with a history of a nonischemic cardiomyopathy, hypertension, diabetes mellitus, chronic kidney disease, hyperlipidemia and medication noncompliance.  He has a history of atrial fibrillation.  He was seen yesterday in consultation for further evaluation of his atrial fibrillation.  Patient was admitted to the hospital at [facility] approximately 11 days ago with increasing shortness of breath and leg swelling.  He had run out of his medications for about a week.  He was in rapid atrial fibrillation, acute heart failure. Respiratory status worsened and he was ultimately intubated. He was transferred from [facility] to [facility] several days ago.  He is diuresed over 6 L so far during this admission.  He is feeling better. Scheduled for cardioversion   Inpatient Medications    Scheduled Meds: . aspirin  81 mg Oral Daily  . atorvastatin  40 mg Oral q1800  . Chlorhexidine Gluconate Cloth  6 each Topical Daily  . edoxaban  30 mg Oral Q24H  . feeding supplement (ENSURE ENLIVE)  237 mL Oral BID BM  . insulin aspart  0-9 Units Subcutaneous TID WC  . metoprolol succinate  25 mg Oral QPM  . metoprolol succinate  50 mg Oral q morning - 10a  . pantoprazole  40 mg Oral Daily  . potassium chloride  40 mEq Oral Daily  . predniSONE  30 mg Oral Q breakfast  . sodium chloride flush  10-40 mL Intracatheter Q12H  . sodium chloride flush  3 mL Intravenous Q12H   Continuous Infusions: . sodium chloride    . amiodarone 30 mg/hr (01/10/18 0829)   PRN Meds: acetaminophen, fentaNYL (SUBLIMAZE) injection, fentaNYL (SUBLIMAZE) injection, levalbuterol, morphine injection, ondansetron (ZOFRAN) IV, sodium chloride, sodium chloride flush, sodium chloride flush   Vital Signs    Vitals:   01/10/18 0021 01/10/18 0453 01/10/18  0801 01/10/18 0831  BP: 90/70 96/75 102/75 93/78  Pulse: 75 (!) 112  95  Resp: 10 13    Temp: 97.8 F (36.6 C) (!) 97.5 F (36.4 C) 97.6 F (36.4 C)   TempSrc: Axillary Oral Oral   SpO2: 97% 95% 93%   Weight:  93.4 kg    Height:        Intake/Output Summary (Last 24 hours) at 01/10/2018 0946 Last data filed at 01/10/2018 0834 Gross per 24 hour  Intake 20 ml  Output 925 ml  Net -905 ml   Filed Weights   01/08/18 0429 01/09/18 0517 01/10/18 0453  Weight: 94.1 kg 95.3 kg 93.4 kg    Telemetry       ECG      Physical Exam   Physical Exam: Blood pressure 93/78, pulse 95, temperature 97.6 F (36.4 C), temperature source Oral, resp. rate 13, height 5\' 8"  (1.727 m), weight 93.4 kg, SpO2 93 %.  GEN:   Elderly male, chronically  HEENT: Normal NECK: No JVD; No carotid bruits LYMPHATICS: No lymphadenopathy CARDIAC: Irreg. Irreg.  RESPIRATORY:  Clear to auscultation without rales, wheezing or rhonchi  ABDOMEN: Soft, non-tender, non-distended MUSCULOSKELETAL:  No edema; No deformity  SKIN: Warm and dry NEUROLOGIC:  Alert and oriented x 3   Labs    Chemistry Recent Labs  Lab 01/04/18 0435  01/08/18 1430 01/09/18 0511 01/10/18 0651  NA 143   < > 137 137 137  K 3.4*   < > 4.4 4.2  4.2  CL 103   < > 95* 94* 96*  CO2 30   < > 30 30 31   GLUCOSE 123*   < > 173* 124* 128*  BUN 25*   < > 59* 62* 59*  CREATININE 1.53*   < > 2.48* 2.32* 2.17*  CALCIUM 8.0*   < > 7.9* 7.9* 8.0*  PROT 5.4*  --   --   --   --   ALBUMIN 2.2*  --   --  2.2* 2.0*  AST 24  --   --   --   --   ALT 17  --   --   --   --   ALKPHOS 102  --   --   --   --   BILITOT 1.3*  --   --   --   --   GFRNONAA 45*   < > 25* 27* 30*  GFRAA 53*   < > 29* 32* 34*  ANIONGAP 10   < > 12 13 10    < > = values in this interval not displayed.     Hematology Recent Labs  Lab 01/07/18 0358 01/09/18 0500 01/10/18 0651  WBC 14.9* 9.6 10.4  RBC 5.86* 5.39 5.16  HGB 15.2 13.7 13.3  HCT 48.3 43.9 42.1  MCV  82.4 81.4 81.6  MCH 25.9* 25.4* 25.8*  MCHC 31.5 31.2 31.6  RDW 16.8* 15.6* 15.3  PLT 108* 111* 113*    Cardiac EnzymesNo results for input(s): TROPONINI in the last 168 hours. No results for input(s): TROPIPOC in the last 168 hours.   BNPNo results for input(s): BNP, PROBNP in the last 168 hours.   DDimer No results for input(s): DDIMER in the last 168 hours.   Radiology    No results found.  Cardiac Studies      Patient Profile     72 y.o. male with a history of chronic systolic and diastolic congestive heart failure and atrial fibrillation.  He was admitted with respiratory distress and rapid atrial fibrillation.  Assessment & Plan    1.  Atrial fibrillation:  Heart rate is well controlled.  He is scheduled for a cardioversion tomorrow. Continue current medications   2.  Acute on chronic systolic congestive heart failure: Continue diuresis.  Creatinine has continued to improve.  The creatinine is 2.17 today.Marland Kitchen He is continued to diurese and is feeling better.   For questions or updates, please contact [place] Please consult [email or web address] for contact info under      Signed, Mertie Moores, MD  01/10/2018, 9:46 AM

## 2018-01-10 NOTE — Progress Notes (Signed)
Phillip NOTE    LEGEND TUMMINELLO  VEL:[id number] DOB: [birth year] DOA: 12/28/2017 PCP: Alycia Rossetti, Phillip    Brief Narrative:  72 y.o. male with past medical history significant for nonischemic cardiomyopathy (last echo demonstrating ejection fraction 15-20%; 2017), Phillip Wilson, Phillip Wilson, essential hypertension, gastroesophageal reflux disease hyperlipidemia and paroxysmal atrial fibrillation (on Savaysa); who presented to the emergency department with complaints of shortness of breath and worsening swelling.  Patient reports symptoms have been present for the last week or so and worsening in the last 3 days prior to admission. Patient expressed running out of his medication approximately 6-7 days prior to hospitalization and reported not the best compliance with low-sodium diet.  He was seen by primary care physician approximately about 6 days prior to this visit and at that time had an elevated BNP and was short of breath; patient was instructed to come to the hospital for admission and to receive acute diuresis; his medications were refilled and he will decide took 10 by mouth instead of being admitted at that time.  Symptoms failed to improve after restarting medication and he presented to the emergency department for further evaluation and management.  Patient reported orthopnea Patient denies chest pain, fever, chills, nausea/vomiting, abdominal pain, palpitations, headache, blurred vision, focal weakness, dysuria, hematuria, melena, hematochezia or any other acute complaints.  Patient continue having difficult to control A. fib with RVR, experienced acute respiratory failure with hypoxia and required intubation consult December 30, 2017.  After aggressive diuresis fluid overload status has not significantly improved but patient continued to have uncontrolled A. fib.  Cardiology is on board and assisting with management and has recommended  transfer to [facility] for TEE and EP evaluation.  01/10/2018: Patient seen.  No new complaints.  Patient remains stable.  For TEE and cardioversion on Monday, 01/11/2018.  Patient's diuretics is on hold.  Assessment & Plan: Acute respiratory failure with hypoxia: -This is in the setting of acute on Phillip systolic heart failure and documented aspiration pneumonia -Patient was intubated from 12/2-12/5 -Patient has completed antibiotic therapy (Unasyn and Augmentin) -Currently afebrile and with normal WBCs -Overall breathing significantly improve and denying orthopnea -In the setting of acute on Phillip renal failure diuretics will be placed on hold for now. -follow daily weights and low sodium diet.  -Cardiology team is following patient.  Acute on Phillip systolic heart failure: -Patient ejection fraction 15 to 20% -Continue beta-blocker -No ACE/ARB due to worsening renal function (AKI on CKD) -Prior history of allergic reaction to [facility] -Cardiology is on board and will continue following rec's and guided treatment -Diuretics is on hold. -For TEE and cardioversion in am (01/11/18). ? Need for AICD, but will defer to Cardiology/EP -Continue daily weights and strict intake and output.  A. fib with RVR: -Cardiology has recommended TEE with cardioversion -Transferred to [facility] for further cardiac work up. -For now continue amiodarone drip and current dose of oral beta-blocker. -CHADSVASC score 4; continue edoxaban TEE and cardioversion on Monday, 01/11/2018  Hyperlipidemia: -continue statins  Hypertension: -soft but stable and tolerating current dose of b-blocker   Diabetes Mellitus with Wilson: -Continue sliding scale insulin and Lantus -Follow CBGs and further adjust hypoglycemic regimen as needed. -last A1C 6.6  Gastroesophageal reflux disease: -continue PPI  Acute kidney injury:  -Likely Multifactorial (Hemodynamic changes/Low BP,  Advanced CHF with left and right heart failure, ?Recent aspiration pneumonia, Medication etc). -Hematuria noted, with relatively preserved  Hemoglobin level (Renal US to rule out Malignancy, and also assist with evaluation of AKI) -UPC - Diuretics are on hold. -Follow trend closely -Low threshold to consult NephrologyI  Left hand gout flare -Resolved  Hematuria: - Renal US -Patient is on Edoxaban -Consider follow up with Urology/Nephrology on DC if renal ultrasound is non revealing  DVT prophylaxis: edoxaban  Code Status: Full code Family Communication: No family at bedside. Disposition Plan: This will depend on hospital course Consultants:   Pulmonary service  Cardiology  EP  Procedures:   See below for x-ray reports  2D echo - Left ventricle: The cavity size was normal. Wall thickness was   increased in a pattern of mild LVH. Systolic function was   severely reduced. The estimated ejection fraction was in the   range of 15% to 20%. Diffuse hypokinesis. The study was not   technically sufficient to allow evaluation of LV diastolic   dysfunction due to atrial fibrillation. - Aortic valve: Mildly calcified annulus. Trileaflet. - Mitral valve: Mildly calcified annulus. There was mild   regurgitation. - Right ventricle: The cavity size was moderately dilated. Systolic   function was severely reduced. - Right atrium: The atrium was mildly dilated. - Tricuspid valve: There was mild regurgitation. Peak RV-RA   gradient (S): 36 mm Hg. - Pulmonary arteries: Systolic pressure could not be accurately   estimated. - Inferior vena cava: The vessel was dilated. Patient on   ventilator. - Pericardium, extracardiac: A small pericardial effusion was   identified posterior to the heart.  Planning TEE and cardioversion  Antimicrobials:  Anti-infectives (From admission, onward)   Start     Dose/Rate Route Frequency Ordered Stop   01/02/18 2200  amoxicillin-clavulanate  (AUGMENTIN) 875-125 MG per tablet 1 tablet  Status:  Discontinued     1 tablet Oral Every 12 hours 01/02/18 1025 01/05/18 1452   12/29/17 1600  Ampicillin-Sulbactam (UNASYN) 3 g in sodium chloride 0.9 % 100 mL IVPB     3 g 200 mL/hr over 30 Minutes Intravenous Every 8 hours 12/29/17 0950 01/02/18 1641   12/29/17 0200  Ampicillin-Sulbactam (UNASYN) 3 g in sodium chloride 0.9 % 100 mL IVPB  Status:  Discontinued     3 g 200 mL/hr over 30 Minutes Intravenous Every 6 hours 12/29/17 0145 12/29/17 0950      Subjective: No new complaints. No fever or chills. No worsening shortness of breath No chest pain.  Objective: Vitals:   01/10/18 0831 01/10/18 1133 01/10/18 1641 01/10/18 1722  BP: 93/78 93/60 95/80  98/85  Pulse: 95   (!) 103  Resp:      Temp:  98.2 F (36.8 C) 97.6 F (36.4 C)   TempSrc:  Oral Oral   SpO2:  95% 94%   Weight:      Height:        Intake/Output Summary (Last 24 hours) at 01/10/2018 1816 Last data filed at 01/10/2018 1600 Gross per 24 hour  Intake 1459.96 ml  Output 1175 ml  Net 284.96 ml   Filed Weights   01/08/18 0429 01/09/18 0517 01/10/18 0453  Weight: 94.1 kg 95.3 kg 93.4 kg    Examination: General exam: Alert, awake, oriented x 3; seated on chair, reports improvement in his breathing, overall weakness, deconditioning and shortness of breath on exertion.  He denies chest pain or palpitation at this time. Respiratory system: Fine crackles at the bases, no wheezing, no using accessory muscles. Cardiovascular system: Irregular irregular.  Soft murmur appreciated on exam, no  rubs, no gallops, no JVD appreciated.  Gastrointestinal system: Abdomen is nondistended, soft and nontender. No organomegaly or masses felt. Normal bowel sounds heard. Central nervous system: Alert and oriented.  Moving 4 limbs spontaneously. Extremities: No cyanosis or clubbing; trace to 1+ edema appreciated bilaterally.  TED hoses in place. Skin: No rashes, lesions or  ulcers Psychiatry: Judgement and insight appear normal. Mood & affect appropriate.   Data Reviewed: I have personally reviewed following labs and imaging studies  CBC: Recent Labs  Lab 01/05/18 0352 01/06/18 0408 01/07/18 0358 01/09/18 0500 01/10/18 0651  WBC 9.9 8.5 14.9* 9.6 10.4  NEUTROABS  --   --   --  8.6* 9.1*  HGB 13.4 15.4 15.2 13.7 13.3  HCT 43.3 49.2 48.3 43.9 42.1  MCV 83.1 84.1 82.4 81.4 81.6  PLT 63* 86* 108* 111* 626*   Basic Metabolic Panel: Recent Labs  Lab 01/07/18 0358 01/07/18 2209 01/08/18 1430 01/09/18 0511 01/10/18 0651  NA 139 137 137 137 137  K 4.1 4.3 4.4 4.2 4.2  CL 99 95* 95* 94* 96*  CO2 30 30 30 30 31   GLUCOSE 148* 164* 173* 124* 128*  BUN 53* 60* 59* 62* 59*  CREATININE 2.36* 2.71* 2.48* 2.32* 2.17*  CALCIUM 8.0* 8.0* 7.9* 7.9* 8.0*  MG  --   --   --  2.2 2.2  PHOS  --   --   --  3.2 2.5   GFR: Estimated Creatinine Clearance: 35.1 mL/min (A) (by C-G formula based on SCr of 2.17 mg/dL (H)).   Liver Function Tests: Recent Labs  Lab 01/04/18 0435 01/09/18 0511 01/10/18 0651  AST 24  --   --   ALT 17  --   --   ALKPHOS 102  --   --   BILITOT 1.3*  --   --   PROT 5.4*  --   --   ALBUMIN 2.2* 2.2* 2.0*   CBG: Recent Labs  Lab 01/09/18 1101 01/09/18 1626 01/09/18 2121 01/10/18 0727 01/10/18 1059  GLUCAP 149* 129* 143* 142* 144*   Urine analysis:    Component Value Date/Time   COLORURINE RED (A) 01/04/2018 1306   APPEARANCEUR TURBID (A) 01/04/2018 1306   LABSPEC  01/04/2018 1306    TEST NOT REPORTED DUE TO COLOR INTERFERENCE OF URINE PIGMENT   PHURINE  01/04/2018 1306    TEST NOT REPORTED DUE TO COLOR INTERFERENCE OF URINE PIGMENT   GLUCOSEU (A) 01/04/2018 1306    TEST NOT REPORTED DUE TO COLOR INTERFERENCE OF URINE PIGMENT   HGBUR (A) 01/04/2018 1306    TEST NOT REPORTED DUE TO COLOR INTERFERENCE OF URINE PIGMENT   HGBUR negative 02/14/2008 1349   BILIRUBINUR (A) 01/04/2018 1306    TEST NOT REPORTED DUE TO COLOR  INTERFERENCE OF URINE PIGMENT   KETONESUR (A) 01/04/2018 1306    TEST NOT REPORTED DUE TO COLOR INTERFERENCE OF URINE PIGMENT   PROTEINUR (A) 01/04/2018 1306    TEST NOT REPORTED DUE TO COLOR INTERFERENCE OF URINE PIGMENT   UROBILINOGEN 1.0 01/21/2011 1246   NITRITE (A) 01/04/2018 1306    TEST NOT REPORTED DUE TO COLOR INTERFERENCE OF URINE PIGMENT   LEUKOCYTESUR (A) 01/04/2018 1306    TEST NOT REPORTED DUE TO COLOR INTERFERENCE OF URINE PIGMENT    Recent Results (from the past 240 hour(s))  Culture, Urine     Status: None   Collection Time: 01/01/18  9:00 PM  Result Value Ref Range Status   Specimen Description  Final    URINE, CLEAN CATCH Performed at [facility], [address], [place], [place] [address]    Special Requests   Final    NONE Performed at [facility], [address] [place], [place] [address]    Culture   Final    NO GROWTH Performed at [facility], [place] [address] [place], [place] [address]    Report Status 01/03/2018 FINAL  Final  Culture, blood (Routine X 2) w Reflex to ID Panel     Status: None   Collection Time: 01/04/18 10:53 AM  Result Value Ref Range Status   Specimen Description LEFT ANTECUBITAL  Final   Special Requests   Final    BOTTLES DRAWN AEROBIC ONLY Blood Culture adequate volume   Culture   Final    NO GROWTH 5 DAYS Performed at [facility], [address], [place], [place] [address]    Report Status 01/09/2018 FINAL  Final  Culture, blood (Routine X 2) w Reflex to ID Panel     Status: None   Collection Time: 01/04/18 11:00 AM  Result Value Ref Range Status   Specimen Description BLOOD LEFT HAND  Final   Special Requests   Final    BOTTLES DRAWN AEROBIC ONLY Blood Culture adequate volume   Culture   Final    NO GROWTH 5 DAYS Performed at [facility], [address], [place], [place] [address]    Report Status 01/09/2018 FINAL  Final  Culture, Urine     Status: None   Collection Time: 01/04/18  1:06 PM  Result Value Ref  Range Status   Specimen Description   Final    URINE, CATHETERIZED Performed at [facility], [address], [place], [place] [address]    Special Requests   Final    NONE Performed at [facility], [address] [place], [place] [address]    Culture   Final    NO GROWTH Performed at [facility], [place] [address] [place],  [address]    Report Status 01/06/2018 FINAL  Final     Radiology Studies: No results found. Scheduled Meds: . aspirin  81 mg Oral Daily  . atorvastatin  40 mg Oral q1800  . Chlorhexidine Gluconate Cloth  6 each Topical Daily  . edoxaban  30 mg Oral Q24H  . feeding supplement (ENSURE ENLIVE)  237 mL Oral BID BM  . insulin aspart  0-9 Units Subcutaneous TID WC  . metoprolol succinate  25 mg Oral QPM  . metoprolol succinate  50 mg Oral q morning - 10a  . pantoprazole  40 mg Oral Daily  . potassium chloride  40 mEq Oral Daily  . predniSONE  30 mg Oral Q breakfast  . sodium chloride flush  10-40 mL Intracatheter Q12H  . sodium chloride flush  3 mL Intravenous Q12H   Continuous Infusions: . sodium chloride    . amiodarone 30 mg/hr (01/10/18 1600)     LOS: 13 days    Time spent: 35 minutes.     Bonnell Public, Phillip [facility] Pager [phone]  If 7PM-7AM, please contact night-coverage [email or web address] Password [facility] 01/10/2018, 6:16 PM

## 2018-01-10 NOTE — H&P (View-Only) (Signed)
Progress Note  Patient Name: Phillip Wilson Date of Encounter: 01/10/2018  Primary Cardiologist: Kate Sable, MD    Subjective   72 year old gentleman with a history of a nonischemic cardiomyopathy, hypertension, diabetes mellitus, chronic kidney disease, hyperlipidemia and medication noncompliance.  He has a history of atrial fibrillation.  He was seen yesterday in consultation for further evaluation of his atrial fibrillation.  Patient was admitted to the hospital at [facility] approximately 11 days ago with increasing shortness of breath and leg swelling.  He had run out of his medications for about a week.  He was in rapid atrial fibrillation, acute heart failure. Respiratory status worsened and he was ultimately intubated. He was transferred from [facility] to [facility] several days ago.  He is diuresed over 6 L so far during this admission.  He is feeling better. Scheduled for cardioversion   Inpatient Medications    Scheduled Meds: . aspirin  81 mg Oral Daily  . atorvastatin  40 mg Oral q1800  . Chlorhexidine Gluconate Cloth  6 each Topical Daily  . edoxaban  30 mg Oral Q24H  . feeding supplement (ENSURE ENLIVE)  237 mL Oral BID BM  . insulin aspart  0-9 Units Subcutaneous TID WC  . metoprolol succinate  25 mg Oral QPM  . metoprolol succinate  50 mg Oral q morning - 10a  . pantoprazole  40 mg Oral Daily  . potassium chloride  40 mEq Oral Daily  . predniSONE  30 mg Oral Q breakfast  . sodium chloride flush  10-40 mL Intracatheter Q12H  . sodium chloride flush  3 mL Intravenous Q12H   Continuous Infusions: . sodium chloride    . amiodarone 30 mg/hr (01/10/18 0829)   PRN Meds: acetaminophen, fentaNYL (SUBLIMAZE) injection, fentaNYL (SUBLIMAZE) injection, levalbuterol, morphine injection, ondansetron (ZOFRAN) IV, sodium chloride, sodium chloride flush, sodium chloride flush   Vital Signs    Vitals:   01/10/18 0021 01/10/18 0453 01/10/18  0801 01/10/18 0831  BP: 90/70 96/75 102/75 93/78  Pulse: 75 (!) 112  95  Resp: 10 13    Temp: 97.8 F (36.6 C) (!) 97.5 F (36.4 C) 97.6 F (36.4 C)   TempSrc: Axillary Oral Oral   SpO2: 97% 95% 93%   Weight:  93.4 kg    Height:        Intake/Output Summary (Last 24 hours) at 01/10/2018 0946 Last data filed at 01/10/2018 0834 Gross per 24 hour  Intake 20 ml  Output 925 ml  Net -905 ml   Filed Weights   01/08/18 0429 01/09/18 0517 01/10/18 0453  Weight: 94.1 kg 95.3 kg 93.4 kg    Telemetry       ECG      Physical Exam   Physical Exam: Blood pressure 93/78, pulse 95, temperature 97.6 F (36.4 C), temperature source Oral, resp. rate 13, height 5\' 8"  (1.727 m), weight 93.4 kg, SpO2 93 %.  GEN:   Elderly male, chronically  HEENT: Normal NECK: No JVD; No carotid bruits LYMPHATICS: No lymphadenopathy CARDIAC: Irreg. Irreg.  RESPIRATORY:  Clear to auscultation without rales, wheezing or rhonchi  ABDOMEN: Soft, non-tender, non-distended MUSCULOSKELETAL:  No edema; No deformity  SKIN: Warm and dry NEUROLOGIC:  Alert and oriented x 3   Labs    Chemistry Recent Labs  Lab 01/04/18 0435  01/08/18 1430 01/09/18 0511 01/10/18 0651  NA 143   < > 137 137 137  K 3.4*   < > 4.4 4.2  4.2  CL 103   < > 95* 94* 96*  CO2 30   < > 30 30 31   GLUCOSE 123*   < > 173* 124* 128*  BUN 25*   < > 59* 62* 59*  CREATININE 1.53*   < > 2.48* 2.32* 2.17*  CALCIUM 8.0*   < > 7.9* 7.9* 8.0*  PROT 5.4*  --   --   --   --   ALBUMIN 2.2*  --   --  2.2* 2.0*  AST 24  --   --   --   --   ALT 17  --   --   --   --   ALKPHOS 102  --   --   --   --   BILITOT 1.3*  --   --   --   --   GFRNONAA 45*   < > 25* 27* 30*  GFRAA 53*   < > 29* 32* 34*  ANIONGAP 10   < > 12 13 10    < > = values in this interval not displayed.     Hematology Recent Labs  Lab 01/07/18 0358 01/09/18 0500 01/10/18 0651  WBC 14.9* 9.6 10.4  RBC 5.86* 5.39 5.16  HGB 15.2 13.7 13.3  HCT 48.3 43.9 42.1  MCV  82.4 81.4 81.6  MCH 25.9* 25.4* 25.8*  MCHC 31.5 31.2 31.6  RDW 16.8* 15.6* 15.3  PLT 108* 111* 113*    Cardiac EnzymesNo results for input(s): TROPONINI in the last 168 hours. No results for input(s): TROPIPOC in the last 168 hours.   BNPNo results for input(s): BNP, PROBNP in the last 168 hours.   DDimer No results for input(s): DDIMER in the last 168 hours.   Radiology    No results found.  Cardiac Studies      Patient Profile     72 y.o. male with a history of chronic systolic and diastolic congestive heart failure and atrial fibrillation.  He was admitted with respiratory distress and rapid atrial fibrillation.  Assessment & Plan    1.  Atrial fibrillation:  Heart rate is well controlled.  He is scheduled for a cardioversion tomorrow. Continue current medications   2.  Acute on chronic systolic congestive heart failure: Continue diuresis.  Creatinine has continued to improve.  The creatinine is 2.17 today.Marland Kitchen He is continued to diurese and is feeling better.   For questions or updates, please contact [place] Please consult [email or web address] for contact info under      Signed, Mertie Moores, MD  01/10/2018, 9:46 AM

## 2018-01-11 ENCOUNTER — Inpatient Hospital Stay (HOSPITAL_COMMUNITY): Payer: BLUE CROSS/BLUE SHIELD

## 2018-01-11 ENCOUNTER — Encounter (HOSPITAL_COMMUNITY): Payer: Self-pay | Admitting: *Deleted

## 2018-01-11 ENCOUNTER — Encounter (HOSPITAL_COMMUNITY)
Admission: EM | Disposition: A | Discharge status: Skilled Nursing Facility | Payer: Self-pay | Source: Other Acute Inpatient Hospital | Attending: Internal Medicine

## 2018-01-11 ENCOUNTER — Inpatient Hospital Stay (HOSPITAL_COMMUNITY): Payer: BLUE CROSS/BLUE SHIELD | Admitting: Anesthesiology

## 2018-01-11 DIAGNOSIS — I509 Heart failure, unspecified: Secondary | ICD-10-CM

## 2018-01-11 DIAGNOSIS — I4891 Unspecified atrial fibrillation: Secondary | ICD-10-CM

## 2018-01-11 DIAGNOSIS — I340 Nonrheumatic mitral (valve) insufficiency: Secondary | ICD-10-CM

## 2018-01-11 HISTORY — PX: CARDIOVERSION: SHX1299

## 2018-01-11 HISTORY — PX: TEE WITHOUT CARDIOVERSION: SHX5443

## 2018-01-11 LAB — URINALYSIS, ROUTINE W REFLEX MICROSCOPIC
Bacteria, UA: NONE SEEN
Bilirubin Urine: NEGATIVE
Glucose, UA: NEGATIVE mg/dL
Ketones, ur: NEGATIVE mg/dL
Leukocytes, UA: NEGATIVE
Nitrite: NEGATIVE
Protein, ur: NEGATIVE mg/dL
RBC / HPF: >50 RBC/hpf — ABNORMAL HIGH (ref 0–5)
Specific Gravity, Urine: 1.017 (ref 1.005–1.030)
pH: 5 (ref 5.0–8.0)

## 2018-01-11 LAB — PROTEIN, URINE, RANDOM: Total Protein, Urine: 19 mg/dL

## 2018-01-11 LAB — CREATININE, URINE, RANDOM: Creatinine, Urine: 77.1 mg/dL

## 2018-01-11 LAB — GLUCOSE, CAPILLARY
GLUCOSE-CAPILLARY: 147 mg/dL — AB (ref 70–99)
GLUCOSE-CAPILLARY: 141 mg/dL — AB (ref 70–99)
Glucose-Capillary: 202 mg/dL — ABNORMAL HIGH (ref 70–99)
Glucose-Capillary: 97 mg/dL (ref 70–99)
Glucose-Capillary: 167 mg/dL — ABNORMAL HIGH (ref 70–99)
Glucose-Capillary: 159 mg/dL — ABNORMAL HIGH (ref 70–99)

## 2018-01-11 LAB — SODIUM, URINE, RANDOM: Sodium, Ur: 10 mmol/L

## 2018-01-11 SURGERY — ECHOCARDIOGRAM, TRANSESOPHAGEAL
Anesthesia: General

## 2018-01-11 MED ORDER — LIDOCAINE HCL (CARDIAC) PF 100 MG/5ML IV SOSY
PREFILLED_SYRINGE | INTRAVENOUS | Status: DC | PRN
  Administered 2018-01-11: 40 mg via INTRATRACHEAL

## 2018-01-11 MED ORDER — PHENYLEPHRINE HCL 10 MG/ML IJ SOLN
INTRAMUSCULAR | Status: DC | PRN
  Administered 2018-01-11 (×3): 80 ug via INTRAVENOUS

## 2018-01-11 MED ORDER — MORPHINE SULFATE (PF) 2 MG/ML IV SOLN: 1.0000 mg | INTRAVENOUS | Status: DC | PRN

## 2018-01-11 MED ORDER — PROPOFOL 500 MG/50ML IV EMUL: INTRAVENOUS | Status: DC | PRN

## 2018-01-11 MED ORDER — METOPROLOL SUCCINATE ER 25 MG PO TB24
25.0000 mg | ORAL_TABLET | Freq: Two times a day (BID) | ORAL | Status: DC
  Administered 2018-01-11: 25 mg via ORAL
  Filled 2018-01-11 (×2): qty 1

## 2018-01-11 MED ORDER — SODIUM CHLORIDE 0.9 % IV SOLN
INTRAVENOUS | Status: DC | PRN
  Administered 2018-01-11: 08:00:00 via INTRAVENOUS

## 2018-01-11 MED ORDER — AMIODARONE HCL 200 MG PO TABS
400.0000 mg | ORAL_TABLET | Freq: Two times a day (BID) | ORAL | Status: DC
  Administered 2018-01-11 – 2018-01-13 (×4): 400 mg via ORAL
  Filled 2018-01-11 (×4): qty 2

## 2018-01-11 MED ORDER — PROPOFOL 10 MG/ML IV BOLUS
INTRAVENOUS | Status: DC | PRN
  Administered 2018-01-11 (×2): 20 mg via INTRAVENOUS
  Administered 2018-01-11 (×2): 10 mg via INTRAVENOUS
  Administered 2018-01-11: 20 mg via INTRAVENOUS
  Administered 2018-01-11: 10 mg via INTRAVENOUS
  Administered 2018-01-11: 20 mg via INTRAVENOUS
  Administered 2018-01-11 (×2): 10 mg via INTRAVENOUS

## 2018-01-11 NOTE — Progress Notes (Signed)
Patient returned from TEE/Cardioversion at 0920hrs,  SB on monitor with HR 50s.

## 2018-01-11 NOTE — Anesthesia Procedure Notes (Signed)
Procedure Name: MAC Date/Time: 01/11/2018 8:08 AM Performed by: Neldon Newport, CRNA Pre-anesthesia Checklist: Timeout performed, Patient being monitored, Suction available, Emergency Drugs available and Patient identified Patient Re-evaluated:Patient Re-evaluated prior to induction Oxygen Delivery Method: Nasal cannula and Ambu bag Preoxygenation: Pre-oxygenation with 100% oxygen Induction Type: IV induction Dental Injury: Teeth and Oropharynx as per pre-operative assessment

## 2018-01-11 NOTE — CV Procedure (Addendum)
Procedure: TEE  Indication: Atrial fibrillation  Sedation: Per anesthesiology  Findings: Please see echo section for full report.  Severely dilated LV with EF 20%, diffuse hypokinesis.  No LV thrombus noted.  Moderately dilated RV with moderate systolic dysfunction.  Moderate left atrial enlargement, no LA appendage thrombus though there was LA appendage smoke.  Mild right atrial enlargement.  No PFO/ASD by color doppler.  There was mild tricuspid regurgitation, peak RV-RA gradient 22 mmHg.  Trileaflet aortic valve with no stenosis and trivial aortic insufficiency.  There was moderate central MR, likely functional.  PISA ERO 0.33 cm^2.  Flattened of the pulmonary vein systolic doppler signal but no reversal. Normal caliber aorta with mild plaque in the descending thoracic aorta.    May proceed to DCCV.   Phillip Wilson 01/11/2018 8:42 AM

## 2018-01-11 NOTE — Anesthesia Postprocedure Evaluation (Signed)
Anesthesia Post Note  Patient: Phillip Wilson  Procedure(s) Performed: TRANSESOPHAGEAL ECHOCARDIOGRAM (TEE) (N/A ) CARDIOVERSION (N/A )     Patient location during evaluation: Endoscopy Anesthesia Type: General Level of consciousness: awake Pain management: pain level controlled Vital Signs Assessment: post-procedure vital signs reviewed and stable Respiratory status: spontaneous breathing Cardiovascular status: stable Postop Assessment: no apparent nausea or vomiting Anesthetic complications: no    Last Vitals:  Vitals:   01/11/18 0849 01/11/18 0857  BP: (!) 97/56 107/65  Pulse: (!) 44 (!) 46  Resp: 14 13  Temp:    SpO2: 97% 95%    Last Pain:  Vitals:   01/11/18 0857  TempSrc:   PainSc: 0-No pain   Pain Goal: Patients Stated Pain Goal: 0 (01/09/18 0926)               Huston Foley

## 2018-01-11 NOTE — Progress Notes (Signed)
Patient remains SB 50s on monitor.  Lopressor held, Cardiology notified, PA [place]. Instructed to hold lopressor until patient evaluated.

## 2018-01-11 NOTE — Interval H&P Note (Signed)
History and Physical Interval Note:  01/11/2018 8:25 AM  Phillip Wilson  has presented today for surgery, with the diagnosis of afib  The various methods of treatment have been discussed with the patient and family. After consideration of risks, benefits and other options for treatment, the patient has consented to  Procedure(s): TRANSESOPHAGEAL ECHOCARDIOGRAM (TEE) (N/A) CARDIOVERSION (N/A) as a surgical intervention .  The patient's history has been reviewed, patient examined, no change in status, stable for surgery.  I have reviewed the patient's chart and labs.  Questions were answered to the patient's satisfaction.     [place] [employer]

## 2018-01-11 NOTE — Progress Notes (Signed)
  Echocardiogram Echocardiogram Transesophageal has been performed.  Phillip Wilson 01/11/2018, 9:02 AM

## 2018-01-11 NOTE — Procedures (Signed)
Electrical Cardioversion Procedure Note JARET COPPEDGE [id number] 12/27/1945  Procedure: Electrical Cardioversion Indications:  Atrial Fibrillation  Procedure Details Consent: Risks of procedure as well as the alternatives and risks of each were explained to the (patient/caregiver).  Consent for procedure obtained. Time Out: Verified patient identification, verified procedure, site/side was marked, verified correct patient position, special equipment/implants available, medications/allergies/relevent history reviewed, required imaging and test results available.  Performed  Patient placed on cardiac monitor, pulse oximetry, supplemental oxygen as necessary.  Sedation given: Propofol per anesthesiology Pacer pads placed anterior and posterior chest.  Cardioverted 1 time(s).  Cardioverted at [place].  Evaluation Findings: Post procedure EKG shows: NSR Complications: None Patient did tolerate procedure well.   Loralie Champagne 01/11/2018, 8:42 AM

## 2018-01-11 NOTE — Progress Notes (Signed)
Progress Note  Patient Name: Phillip Wilson Date of Encounter: 01/11/2018  Primary Cardiologist: Kate Sable, MD   Subjective   Denies any CP or SOB.   Inpatient Medications    Scheduled Meds: . aspirin  81 mg Oral Daily  . atorvastatin  40 mg Oral q1800  . Chlorhexidine Gluconate Cloth  6 each Topical Daily  . edoxaban  30 mg Oral Q24H  . feeding supplement (ENSURE ENLIVE)  237 mL Oral BID BM  . insulin aspart  0-9 Units Subcutaneous TID WC  . metoprolol succinate  25 mg Oral QPM  . metoprolol succinate  50 mg Oral q morning - 10a  . pantoprazole  40 mg Oral Daily  . potassium chloride  40 mEq Oral Daily  . predniSONE  30 mg Oral Q breakfast  . sodium chloride flush  10-40 mL Intracatheter Q12H  . sodium chloride flush  3 mL Intravenous Q12H   Continuous Infusions: . sodium chloride Stopped (01/11/18 0845)  . amiodarone 30 mg/hr (01/11/18 0600)   PRN Meds: acetaminophen, fentaNYL (SUBLIMAZE) injection, fentaNYL (SUBLIMAZE) injection, levalbuterol, morphine injection, ondansetron (ZOFRAN) IV, sodium chloride, sodium chloride flush, sodium chloride flush   Vital Signs    Vitals:   01/11/18 0846 01/11/18 0849 01/11/18 0857 01/11/18 0913  BP:  (!) 97/56 107/65 111/84  Pulse:  (!) 44 (!) 46   Resp:  14 13   Temp: 98 F (36.7 C)     TempSrc: Oral     SpO2:  97% 95%   Weight:      Height:        Intake/Output Summary (Last 24 hours) at 01/11/2018 1057 Last data filed at 01/11/2018 0949 Gross per 24 hour  Intake 1874.28 ml  Output 1100 ml  Net 774.28 ml   Filed Weights   01/09/18 0517 01/10/18 0453 01/11/18 0745  Weight: 95.3 kg 93.4 kg 93.4 kg    Telemetry    Sinus bradycardia, converted from afib - Personally Reviewed  ECG    Sinus bradycardia - Personally Reviewed  Physical Exam   GEN: No acute distress.   Neck: No JVD Cardiac: RRR, no murmurs, rubs, or gallops.  Respiratory: Clear to auscultation bilaterally. GI: Soft, nontender,  non-distended  MS: No deformity. 2+ pitting edema Neuro:  Nonfocal  Psych: Normal affect   Labs    Chemistry Recent Labs  Lab 01/08/18 1430 01/09/18 0511 01/10/18 0651  NA 137 137 137  K 4.4 4.2 4.2  CL 95* 94* 96*  CO2 30 30 31   GLUCOSE 173* 124* 128*  BUN 59* 62* 59*  CREATININE 2.48* 2.32* 2.17*  CALCIUM 7.9* 7.9* 8.0*  ALBUMIN  --  2.2* 2.0*  GFRNONAA 25* 27* 30*  GFRAA 29* 32* 34*  ANIONGAP 12 13 10      Hematology Recent Labs  Lab 01/07/18 0358 01/09/18 0500 01/10/18 0651  WBC 14.9* 9.6 10.4  RBC 5.86* 5.39 5.16  HGB 15.2 13.7 13.3  HCT 48.3 43.9 42.1  MCV 82.4 81.4 81.6  MCH 25.9* 25.4* 25.8*  MCHC 31.5 31.2 31.6  RDW 16.8* 15.6* 15.3  PLT 108* 111* 113*    Cardiac EnzymesNo results for input(s): TROPONINI in the last 168 hours. No results for input(s): TROPIPOC in the last 168 hours.   BNPNo results for input(s): BNP, PROBNP in the last 168 hours.   DDimer No results for input(s): DDIMER in the last 168 hours.   Radiology    No results found.  Cardiac Studies  Echo 12/29/2017 LV EF: 15% -   20% Study Conclusions  - Left ventricle: The cavity size was normal. Wall thickness was   increased in a pattern of mild LVH. Systolic function was   severely reduced. The estimated ejection fraction was in the   range of 15% to 20%. Diffuse hypokinesis. The study was not   technically sufficient to allow evaluation of LV diastolic   dysfunction due to atrial fibrillation. - Aortic valve: Mildly calcified annulus. Trileaflet. - Mitral valve: Mildly calcified annulus. There was mild   regurgitation. - Right ventricle: The cavity size was moderately dilated. Systolic   function was severely reduced. - Right atrium: The atrium was mildly dilated. - Tricuspid valve: There was mild regurgitation. Peak RV-RA   gradient (S): 36 mm Hg. - Pulmonary arteries: Systolic pressure could not be accurately   estimated. - Inferior vena cava: The vessel was  dilated. Patient on   ventilator. - Pericardium, extracardiac: A small pericardial effusion was   identified posterior to the heart.   Patient Profile     72 y.o. male with PMH of chronic combined systolic and diastolic CHF, NICM, atrial fibrillation, CKD, HLD, HTN, and medication noncompliance who presented with rapid atrial fibrillation and acute heart failure.   Assessment & Plan    1. Atrial fibrillation with RVR  - underwent successful TEE DCCV on 01/11/2018, converted to NSR.   - continue savaysa and metoprolol.   - bradycardic after cardioversion, consider change Toprol XL to 25mg  BID. Stop IV amiodarone, transitioned to PO amiodarone 400mg  BID for 1 week, then 200mg  BID for 1 month, then 200mg  daily thereafter. Patient was on amiodarone at home before, but compliance not good.   2. Acute on chronic combined systolic and diastolic heart failure  - net -8L so far, noncompliance with medication complicate treatment  - continue restart home torsemide 40mg  BID. Lung clear, 2+ pitting edema  - if renal function worsens again, may consider RHC to check wedge pressure. Patient has a PICC line, question if able to check Co-ox  3. NICM with baseline EF 15-20%: EF unchanged since 2017  4. HTN  5. HLD  6. Acute on chronic renal insufficiency: Cr 0.99-1.23 in 2018, Cr 2.54 in 11/2017  7. Medication noncompliance       For questions or updates, please contact [place] Please consult [email or web address] for contact info under        Signed, Almyra Deforest, [place]  01/11/2018, 10:57 AM

## 2018-01-11 NOTE — Progress Notes (Signed)
[facility] TEAM 1 - Stepdown/ICU TEAM  Phillip Wilson  PJK:[id number] DOB: Jun 15, 1945 DOA: 12/28/2017 PCP: Alycia Rossetti, MD    Brief Narrative:  72 y.o.malew/ a hx of nonischemic cardiomyopathy (EF 15-20%; 2017), DM2 with nephropathy, CKD stage III, HTN, GERD,  HLD, and paroxysmal atrial fibrillation who presented with shortness of breath and worsening swelling for a week.Patient reported running out of his medication approximately6-7days prior to hospitalization, and admitted noncompliance with a low-sodium diet.   Subjective: Resting comfortably in a bedside chair.  Denies chest pain shortness of breath abdominal pain fevers or chills.  Has not yet been up and around to a significant extent.  Monitoring rhythm post-DCCV. Awaiting SNF placement.   Assessment & Plan:  Acute hypoxic respiratory failure with hypoxia - CHF + aspiration pneumonia intubated 12/2  > 12/5 - completed antibiotic therapy using Unasyn and Augmentin - resp status now back to baseline   Acute on chronic systolic HF  ejection fraction 15-20% - CHF Team directing care - reasonably well compensated at this time   A. fib with RVR TEE with cardioversion completed 12/16 - continue amiodarone and beta-blocker - continue edoxaban - care per Cards   Acute kidney injury on stage 3 CKD Most c/w cardiorenal syndrome - crt appears to have stabilized   Recent Labs  Lab 01/07/18 0358 01/07/18 2209 01/08/18 1430 01/09/18 0511 01/10/18 0651  CREATININE 2.36* 2.71* 2.48* 2.32* 2.17*    Hyperlipidemia continue statin  DM2 with nephropathy last A1C 6.6 - CBG well controlled at this time   GERD continue PPI  L hand gout flare stop prednisone and follow - sx now resolved   Hematuria Urology felt most likely irritation by foley w/ use of anticoagulation and underlying BPH - foley removed on 12/10 and patient able to urinate  DVT prophylaxis: edoxaban  Code Status: FULL CODE Family Communication:  no family present at time of exam  Disposition Plan: at discretion of Cardiology team   Consultants:  PCCM CHF Team  EP Urology   Antimicrobials:  None presently   Objective: Blood pressure 102/66, pulse 65, temperature 98 F (36.7 C), temperature source Oral, resp. rate 18, height 5\' 8"  (1.727 m), weight 93.4 kg, SpO2 100 %.  Intake/Output Summary (Last 24 hours) at 01/11/2018 1606 Last data filed at 01/11/2018 1542 Gross per 24 hour  Intake 1058.61 ml  Output 600 ml  Net 458.61 ml   Filed Weights   01/09/18 0517 01/10/18 0453 01/11/18 0745  Weight: 95.3 kg 93.4 kg 93.4 kg    Examination: General: No acute respiratory distress Lungs: Clear to auscultation bilaterally without wheezes or crackles Cardiovascular: Regular rate and rhythm without gallop or rub Abdomen: Nontender, nondistended, soft, bowel sounds positive, no rebound, no ascites, no appreciable mass Extremities: 1+edema bilateral lower extremities  CBC: Recent Labs  Lab 01/07/18 0358 01/09/18 0500 01/10/18 0651  WBC 14.9* 9.6 10.4  NEUTROABS  --  8.6* 9.1*  HGB 15.2 13.7 13.3  HCT 48.3 43.9 42.1  MCV 82.4 81.4 81.6  PLT 108* 111* 809*   Basic Metabolic Panel: Recent Labs  Lab 01/08/18 1430 01/09/18 0511 01/10/18 0651  NA 137 137 137  K 4.4 4.2 4.2  CL 95* 94* 96*  CO2 30 30 31   GLUCOSE 173* 124* 128*  BUN 59* 62* 59*  CREATININE 2.48* 2.32* 2.17*  CALCIUM 7.9* 7.9* 8.0*  MG  --  2.2 2.2  PHOS  --  3.2 2.5   GFR: Estimated Creatinine Clearance:  35.1 mL/min (A) (by C-G formula based on SCr of 2.17 mg/dL (H)).  Liver Function Tests: Recent Labs  Lab 01/09/18 0511 01/10/18 0651  ALBUMIN 2.2* 2.0*    HbA1C: Hgb A1c MFr Bld  Date/Time Value Ref Range Status  12/28/2017 06:12 PM 6.6 (H) 4.8 - 5.6 % Final    Comment:    (NOTE) Pre diabetes:          5.7%-6.4% Diabetes:              >6.4% Glycemic control for   <7.0% adults with diabetes   12/22/2017 12:09 PM 6.6 (H) <5.7 % of  total Hgb Final    Comment:    For someone without known diabetes, a hemoglobin A1c value of 6.5% or greater indicates that they may have  diabetes and this should be confirmed with a follow-up  test. . For someone with known diabetes, a value <7% indicates  that their diabetes is well controlled and a value  greater than or equal to 7% indicates suboptimal  control. A1c targets should be individualized based on  duration of diabetes, age, comorbid conditions, and  other considerations. . Currently, no consensus exists regarding use of hemoglobin A1c for diagnosis of diabetes for children. .     CBG: Recent Labs  Lab 01/10/18 1059 01/10/18 1609 01/10/18 2112 01/11/18 0921 01/11/18 1158  GLUCAP 144* 147* 202* 97 141*    Recent Results (from the past 240 hour(s))  Culture, Urine     Status: None   Collection Time: 01/01/18  9:00 PM  Result Value Ref Range Status   Specimen Description   Final    URINE, CLEAN CATCH Performed at [facility], [address], [place], [place] [address]    Special Requests   Final    NONE Performed at [facility], [address] [place], [place] [address]    Culture   Final    NO GROWTH Performed at [facility], [place] [address] [place], [place] [address]    Report Status 01/03/2018 FINAL  Final  Culture, blood (Routine X 2) w Reflex to ID Panel     Status: None   Collection Time: 01/04/18 10:53 AM  Result Value Ref Range Status   Specimen Description LEFT ANTECUBITAL  Final   Special Requests   Final    BOTTLES DRAWN AEROBIC ONLY Blood Culture adequate volume   Culture   Final    NO GROWTH 5 DAYS Performed at [facility], [address] [place], [place] [address]    Report Status 01/09/2018 FINAL  Final  Culture, blood (Routine X 2) w Reflex to ID Panel     Status: None   Collection Time: 01/04/18 11:00 AM  Result Value Ref Range Status   Specimen Description BLOOD LEFT HAND  Final   Special Requests   Final     BOTTLES DRAWN AEROBIC ONLY Blood Culture adequate volume   Culture   Final    NO GROWTH 5 DAYS Performed at [facility], [address] [place], [place] [address]    Report Status 01/09/2018 FINAL  Final  Culture, Urine     Status: None   Collection Time: 01/04/18  1:06 PM  Result Value Ref Range Status   Specimen Description   Final    URINE, CATHETERIZED Performed at [facility], [address], [place], [place] [address]    Special Requests   Final    NONE Performed at [facility], [place]  [address], [place], [place] [address]    Culture   Final    NO GROWTH Performed at [facility], [place] [address] [place], [place] [address]    Report Status 01/06/2018 FINAL  Final     Scheduled Meds: . aspirin  81 mg Oral Daily  . atorvastatin  40 mg Oral q1800  . Chlorhexidine Gluconate Cloth  6 each Topical Daily  . edoxaban  30 mg Oral Q24H  . feeding supplement (ENSURE ENLIVE)  237 mL Oral BID BM  . insulin aspart  0-9 Units Subcutaneous TID WC  . metoprolol succinate  25 mg Oral BID  . pantoprazole  40 mg Oral Daily  . potassium chloride  40 mEq Oral Daily  . predniSONE  30 mg Oral Q breakfast  . sodium chloride flush  10-40 mL Intracatheter Q12H  . sodium chloride flush  3 mL Intravenous Q12H   Continuous Infusions: . sodium chloride Stopped (01/11/18 0845)  . amiodarone 30 mg/hr (01/11/18 7510)     LOS: 14 days   Cherene Altes, MD [facility] Office  [phone] Pager - Text Page per Amion  If 7PM-7AM, please contact night-coverage per Amion 01/11/2018, 4:06 PM

## 2018-01-11 NOTE — Transfer of Care (Signed)
Immediate Anesthesia Transfer of Care Note  Patient: Phillip Wilson  Procedure(s) Performed: TRANSESOPHAGEAL ECHOCARDIOGRAM (TEE) (N/A ) CARDIOVERSION (N/A )  Patient Location: PACU and Endoscopy Unit  Anesthesia Type:General  Level of Consciousness: awake, alert  and oriented  Airway & Oxygen Therapy: Patient Spontanous Breathing and Patient connected to nasal cannula oxygen  Post-op Assessment: Report given to RN, Post -op Vital signs reviewed and stable and Patient moving all extremities X 4  Post vital signs: Reviewed and stable  Last Vitals:  Vitals Value Taken Time  BP    Temp    Pulse    Resp    SpO2      Last Pain:  Vitals:   01/11/18 0745  TempSrc: Oral  PainSc: 0-No pain      Patients Stated Pain Goal: 0 (18/29/93 7169)  Complications: No apparent anesthesia complications

## 2018-01-11 NOTE — Progress Notes (Signed)
Spoke with PA Eulas Post regarding continuing IV amio gtt.  He will review and order d/c if appropriate.

## 2018-01-11 NOTE — Progress Notes (Signed)
[facility] starting new [employer] authorization for patient today. Auth needed before patient can admit to the facility. CSW to follow and support.  [name], LCSW [phone]

## 2018-01-12 ENCOUNTER — Telehealth: Payer: Self-pay | Admitting: Family Medicine

## 2018-01-12 LAB — COMPREHENSIVE METABOLIC PANEL
ALT: 33 U/L (ref 0–44)
AST: 55 U/L — ABNORMAL HIGH (ref 15–41)
Albumin: 1.9 g/dL — ABNORMAL LOW (ref 3.5–5.0)
Alkaline Phosphatase: 123 U/L (ref 38–126)
Anion gap: 9 (ref 5–15)
BUN: 50 mg/dL — ABNORMAL HIGH (ref 8–23)
CHLORIDE: 98 mmol/L (ref 98–111)
CO2: 30 mmol/L (ref 22–32)
Calcium: 8 mg/dL — ABNORMAL LOW (ref 8.9–10.3)
Creatinine, Ser: 2.05 mg/dL — ABNORMAL HIGH (ref 0.61–1.24)
GFR calc Af Amer: 37 mL/min — ABNORMAL LOW (ref >60)
GFR calc non Af Amer: 32 mL/min — ABNORMAL LOW (ref >60)
Glucose, Bld: 126 mg/dL — ABNORMAL HIGH (ref 70–99)
Potassium: 4.4 mmol/L (ref 3.5–5.1)
Sodium: 137 mmol/L (ref 135–145)
Total Bilirubin: 1.1 mg/dL (ref 0.3–1.2)
Total Protein: 4.7 g/dL — ABNORMAL LOW (ref 6.5–8.1)

## 2018-01-12 LAB — CBC
HCT: 39.5 % (ref 39.0–52.0)
Hemoglobin: 12.2 g/dL — ABNORMAL LOW (ref 13.0–17.0)
MCH: 25.4 pg — ABNORMAL LOW (ref 26.0–34.0)
MCHC: 30.9 g/dL (ref 30.0–36.0)
MCV: 82.3 fL (ref 80.0–100.0)
Platelets: 117 10*3/uL — ABNORMAL LOW (ref 150–400)
RBC: 4.8 MIL/uL (ref 4.22–5.81)
RDW: 15.4 % (ref 11.5–15.5)
WBC: 9.1 10*3/uL (ref 4.0–10.5)
nRBC: 0 % (ref 0.0–0.2)

## 2018-01-12 LAB — MAGNESIUM: Magnesium: 2.3 mg/dL (ref 1.7–2.4)

## 2018-01-12 LAB — GLUCOSE, CAPILLARY
Glucose-Capillary: 105 mg/dL — ABNORMAL HIGH (ref 70–99)
Glucose-Capillary: 124 mg/dL — ABNORMAL HIGH (ref 70–99)
Glucose-Capillary: 120 mg/dL — ABNORMAL HIGH (ref 70–99)
Glucose-Capillary: 101 mg/dL — ABNORMAL HIGH (ref 70–99)

## 2018-01-12 MED ORDER — POTASSIUM CHLORIDE CRYS ER 20 MEQ PO TBCR
20.0000 meq | EXTENDED_RELEASE_TABLET | Freq: Two times a day (BID) | ORAL | Status: DC
  Administered 2018-01-12 – 2018-01-13 (×2): 20 meq via ORAL
  Filled 2018-01-12 (×2): qty 1

## 2018-01-12 MED ORDER — TORSEMIDE 20 MG PO TABS
20.0000 mg | ORAL_TABLET | Freq: Two times a day (BID) | ORAL | Status: DC
  Administered 2018-01-12 – 2018-01-13 (×2): 20 mg via ORAL
  Filled 2018-01-12 (×2): qty 1

## 2018-01-12 NOTE — Telephone Encounter (Signed)
Awaiting forms

## 2018-01-12 NOTE — Progress Notes (Signed)
Progress Note  Patient Name: Phillip Wilson Date of Encounter: 01/12/2018  Primary Cardiologist: Kate Sable, MD   Subjective   Phillip Wilson has been up walking with his walker in his room without any CP, DOE, lightheadedness. He reports that he was not taking his meds at home for a week because he was "hardheaded". He also was not following a low salt diet. He says that he will follow directions now to avoid coming back to the hospital.   Inpatient Medications    Scheduled Meds: . amiodarone  400 mg Oral BID  . aspirin  81 mg Oral Daily  . atorvastatin  40 mg Oral q1800  . Chlorhexidine Gluconate Cloth  6 each Topical Daily  . edoxaban  30 mg Oral Q24H  . feeding supplement (ENSURE ENLIVE)  237 mL Oral BID BM  . insulin aspart  0-9 Units Subcutaneous TID WC  . metoprolol succinate  25 mg Oral BID  . pantoprazole  40 mg Oral Daily  . potassium chloride  40 mEq Oral Daily  . sodium chloride flush  3 mL Intravenous Q12H   Continuous Infusions: . sodium chloride Stopped (01/11/18 0845)   PRN Meds: acetaminophen, levalbuterol, morphine injection, ondansetron (ZOFRAN) IV, sodium chloride, sodium chloride flush   Vital Signs    Vitals:   01/11/18 1950 01/11/18 2338 01/12/18 0549 01/12/18 0743  BP: 105/65 106/71 111/74 110/70  Pulse: (!) 57 (!) 48 (!) 55 (!) 58  Resp: 18  19 18   Temp: 97.9 F (36.6 C)  97.9 F (36.6 C) 98 F (36.7 C)  TempSrc: Oral  Oral Oral  SpO2: 94%  98% 99%  Weight:      Height:        Intake/Output Summary (Last 24 hours) at 01/12/2018 1001 Last data filed at 01/12/2018 0744 Gross per 24 hour  Intake 864.29 ml  Output 1050 ml  Net -185.71 ml   Filed Weights   01/09/18 0517 01/10/18 0453 01/11/18 0745  Weight: 95.3 kg 93.4 kg 93.4 kg    Telemetry    SR in the 40's over night, 50's-60's today.  - Personally Reviewed  ECG    No new tracings - Personally Reviewed  Physical Exam   GEN: No acute distress.   Neck: No  JVD Cardiac: RRR, no murmurs, rubs, or gallops.  Respiratory: Clear to auscultation bilaterally. GI: Soft, nontender, non-distended  MS: 1-2+ lower leg edema; No deformity. Neuro:  Left facial droop and difficult to understand speech, but alert and oriented Psych: Normal affect   Labs    Chemistry Recent Labs  Lab 01/09/18 0511 01/10/18 0651 01/12/18 0444  NA 137 137 137  K 4.2 4.2 4.4  CL 94* 96* 98  CO2 30 31 30   GLUCOSE 124* 128* 126*  BUN 62* 59* 50*  CREATININE 2.32* 2.17* 2.05*  CALCIUM 7.9* 8.0* 8.0*  PROT  --   --  4.7*  ALBUMIN 2.2* 2.0* 1.9*  AST  --   --  55*  ALT  --   --  33  ALKPHOS  --   --  123  BILITOT  --   --  1.1  GFRNONAA 27* 30* 32*  GFRAA 32* 34* 37*  ANIONGAP 13 10 9      Hematology Recent Labs  Lab 01/09/18 0500 01/10/18 0651 01/12/18 0444  WBC 9.6 10.4 9.1  RBC 5.39 5.16 4.80  HGB 13.7 13.3 12.2*  HCT 43.9 42.1 39.5  MCV 81.4 81.6 82.3  MCH  25.4* 25.8* 25.4*  MCHC 31.2 31.6 30.9  RDW 15.6* 15.3 15.4  PLT 111* 113* 117*    Cardiac EnzymesNo results for input(s): TROPONINI in the last 168 hours. No results for input(s): TROPIPOC in the last 168 hours.   BNPNo results for input(s): BNP, PROBNP in the last 168 hours.   DDimer No results for input(s): DDIMER in the last 168 hours.   Radiology    US Renal  Result Date: 01/11/2018 CLINICAL DATA:  Acute renal failure. EXAM: RENAL / URINARY TRACT ULTRASOUND COMPLETE COMPARISON:  None. FINDINGS: Right Kidney: Renal measurements: 11.7 x 5.1 x 6.1 cm = volume: 191 ML. Diffuse increased echogenicity of the renal parenchyma. No hydronephrosis. Small amount of perinephric fluid. Left Kidney: Renal measurements: 10.5 x 5.9 x 5.1 cm = volume: 163 mL. Diffuse increased echogenicity of the renal parenchyma. Tiny amount of perinephric fluid. No hydronephrosis. Bladder: Appears normal for degree of bladder distention. IMPRESSION: Increased echogenicity of the renal parenchyma bilaterally consistent  with renal medical disease. No obstruction. Nonspecific slight perinephric fluid. Electronically Signed   By: Lorriane Shire M.D.   On: 01/11/2018 16:17    Cardiac Studies   TEE 01/11/2018 Severely dilated LV with EF 20%, diffuse hypokinesis.  No LV thrombus noted.  Moderately dilated RV with moderate systolic dysfunction.  Moderate left atrial enlargement, no LA appendage thrombus though there was LA appendage smoke.  Mild right atrial enlargement.  No PFO/ASD by color doppler.  There was mild tricuspid regurgitation, peak RV-RA gradient 22 mmHg.  Trileaflet aortic valve with no stenosis and trivial aortic insufficiency.  There was moderate central MR, likely functional.  PISA ERO 0.33 cm^2.  Flattened of the pulmonary vein systolic doppler signal but no reversal. Normal caliber aorta with mild plaque in the descending thoracic aorta.    Echo 12/29/2017 LV EF: 15% - 20% Study Conclusions  - Left ventricle: The cavity size was normal. Wall thickness was increased in a pattern of mild LVH. Systolic function was severely reduced. The estimated ejection fraction was in the range of 15% to 20%. Diffuse hypokinesis. The study was not technically sufficient to allow evaluation of LV diastolic dysfunction due to atrial fibrillation. - Aortic valve: Mildly calcified annulus. Trileaflet. - Mitral valve: Mildly calcified annulus. There was mild regurgitation. - Right ventricle: The cavity size was moderately dilated. Systolic function was severely reduced. - Right atrium: The atrium was mildly dilated. - Tricuspid valve: There was mild regurgitation. Peak RV-RA gradient (S): 36 mm Hg. - Pulmonary arteries: Systolic pressure could not be accurately estimated. - Inferior vena cava: The vessel was dilated. Patient on ventilator. - Pericardium, extracardiac: A small pericardial effusion was identified posterior to the heart.  Patient Profile     72 y.o. male with PMH of  chronic combined systolic and diastolic CHF, NICM, atrial fibrillation, CKD, HLD, HTN, and medication noncompliance who presented with rapid atrial fibrillation and acute heart failure.   Assessment & Plan    Atrial fibrillation with RVR -Underwent successful TEE DCCV on 01/11/2018, converted to sinus rhythm -He is continued on some vasa for stroke risk reduction -IV amiodarone was transitioned to oral dosing 400 mg twice daily for 1 week then 200 mg twice daily for 1 month then 200 mg daily thereafter.  The patient was on amiodarone at home prior to admission, but compliance was not good. -Patient was on Toprol-XL 25 mg daily at home, increased to twice daily in the hospital, but has not been given due to  low HR.  Heart rate is now running in the 40s overnight, 50s-60s today.  Evening dose of Toprol was held last night.  With loading of amiodarone, will hold metoprolol with close follow-up in the office.  Consider resuming metoprolol if heart rates are maintaining above 60s -He is on Savaysa 30 mg daily for stroke risk reduction, reduced dose for renal function.   Acute on chronic combined systolic and diastolic heart failure -LV systolic function has been low since 2017.  EF 15-20% on 12/29/2017 and also noted to be low, 20%, on TEE done yesterday. -He received torsemide on 12/11. At home he is on Torsemide 40 mg BID. Can likely start back on torsemide, lower dose. Will discuss with Dr. Harrell Gave.  -Weight was 89.4 kg in 03/2017, 96.2 kg in 11/2017 by office notes.  Today weight is stable at 93.4 kg. -Overall fluid balance is net -7.3 L since admission -Not on ACE inhibitor/ARB/Entresto due to renal function.  Beta-blocker on hold for bradycardia. -Still has leg edema, but no DOE, orthopnea, or JVD.  -Low sodium diet, elevated legs  CKD -Serum creatinine is 2.05 today, has trended down from a peak of 2.71.  Nonischemic cardiomyopathy -EF 15-20%, unchanged since 2017  Hypertension -Blood  pressure is well controlled, a little soft at times.  110/70 this morning.  Medication noncompliance -Discussed importance of medication compliance for better outcomes. He states that he will do better.      For questions or updates, please contact [place] Please consult [email or web address] for contact info under        Signed, Daune Perch, NP  01/12/2018, 10:01 AM

## 2018-01-12 NOTE — Telephone Encounter (Signed)
Received [facility] ppw via fax on 01/12/2018  Will route to christina for completion

## 2018-01-12 NOTE — Social Work (Signed)
Awaiting [employer] authorization for SNF. Will support with discharge when auth received and patient medically ready.  [name], LCSW [phone]

## 2018-01-12 NOTE — Progress Notes (Addendum)
Physical Therapy Treatment Patient Details Name: Phillip Wilson MRN: [id number] DOB: February 18, 1945 Today's Date: 01/12/2018    History of Present Illness Pt is a 72 y.o. male admitted to [facility] 12/28/17 with worsening SOB an swelling, recently running out of his medication. Worked up for acute respiratory failure secondary to HF and aspiration pneumonia. Hospital course complicated by a-fib with RVR and hypxia; ETT 12/4-12/5. Pt with cardioversion TEE and cardioversion 12/16. PMH includes HTN, CHF, CKD3, DJD, DM2, GERD.    PT Comments    Pt making steady progress. Continue to recommend ST-SNF for further rehab prior to return home.   Follow Up Recommendations  SNF;Supervision for mobility/OOB     Equipment Recommendations  None recommended by PT    Recommendations for Other Services       Precautions / Restrictions Precautions Precautions: Fall Precaution Comments: Watch HR Restrictions Weight Bearing Restrictions: No    Mobility  Bed Mobility               General bed mobility comments: Pt up in chair  Transfers Overall transfer level: Needs assistance Equipment used: Rolling walker (2 wheeled) Transfers: Sit to/from Stand Sit to Stand: Min assist         General transfer comment: Assist to bring hips up and for balance.  Ambulation/Gait Ambulation/Gait assistance: Min assist Gait Distance (Feet): 80 Feet Assistive device: Rolling walker (2 wheeled) Gait Pattern/deviations: Step-through pattern;Decreased stride length;Staggering left;Drifts right/left Gait velocity: decr Gait velocity interpretation: <1.31 ft/sec, indicative of household ambulator General Gait Details: Assist for balance and support. Amb on RA with SpO2 100%   Stairs             Wheelchair Mobility    Modified Rankin (Stroke Patients Only)       Balance Overall balance assessment: Needs assistance Sitting-balance support: Feet supported;No upper extremity supported Sitting  balance-Leahy Scale: Good     Standing balance support: During functional activity;Bilateral upper extremity supported Standing balance-Leahy Scale: Poor Standing balance comment: walker and min guard for static standing                            Cognition Arousal/Alertness: Awake/alert Behavior During Therapy: WFL for tasks assessed/performed Overall Cognitive Status: Within Functional Limits for tasks assessed                                 General Comments: WFL for simple tasks; likely baseline cognition      Exercises      General Comments        Pertinent Vitals/Pain Pain Assessment: No/denies pain    Home Living                      Prior Function            PT Goals (current goals can now be found in the care plan section) Progress towards PT goals: Progressing toward goals    Frequency    Min 2X/week      PT Plan Current plan remains appropriate    Co-evaluation              AM-PAC PT "6 Clicks" Mobility   Outcome Measure  Help needed turning from your back to your side while in a flat bed without using bedrails?: A Little Help needed moving from lying on your back to sitting on the  side of a flat bed without using bedrails?: A Little Help needed moving to and from a bed to a chair (including a wheelchair)?: A Little Help needed standing up from a chair using your arms (e.g., wheelchair or bedside chair)?: A Little Help needed to walk in hospital room?: A Little Help needed climbing 3-5 steps with a railing? : A Lot 6 Click Score: 17    End of Session Equipment Utilized During Treatment: Gait belt Activity Tolerance: Patient tolerated treatment well Patient left: in chair;with call bell/phone within reach;with chair alarm set Nurse Communication: Mobility status PT Visit Diagnosis: Unsteadiness on feet (R26.81);Other abnormalities of gait and mobility (R26.89);Muscle weakness (generalized) (M62.81)      Time: 1210-1228 PT Time Calculation (min) (ACUTE ONLY): 18 min  Charges:  $Gait Training: 8-22 mins                     [place] Pager [phone] Office [place] 01/12/2018, 1:39 PM

## 2018-01-12 NOTE — Progress Notes (Signed)
Occupational Therapy Treatment Patient Details Name: Phillip Wilson MRN: [id number] DOB: 1945-12-26 Today's Date: 01/12/2018    History of present illness Pt is a 72 y.o. male admitted to [facility] 12[facility]28/17 with worsening SOB an swelling, recently running out of his medication. Worked up for acute respiratory failure secondary to HF and aspiration pneumonia. Hospital course complicated by a-fib with RVR and hypxia; ETT 12/4-12/5. Pt with cardioversion TEE and cardioversion 12/16. PMH includes HTN, CHF, CKD3, DJD, DM2, GERD.   OT comments  Pt progressing towards OT goals, presents up in bathroom upon arrival pleasant and motivated to work with therapy. Pt performing functional mobility in room using RW with minA; currently requires minA for LB and toileting ADLs. Additional focus on seated UB/LB exercise for continued strengthening, endurance, and for LB edema management. Pt motivated to return to PLOF. Continue per POC.    Follow Up Recommendations  SNF;Supervision/Assistance - 24 hour    Equipment Recommendations  Other (comment)(TBD in next venue)          Precautions / Restrictions Precautions Precautions: Fall Precaution Comments: Watch HR Restrictions Weight Bearing Restrictions: No       Mobility Bed Mobility               General bed mobility comments: Pt up in chair  Transfers Overall transfer level: Needs assistance Equipment used: Rolling walker (2 wheeled) Transfers: Sit to/from Stand Sit to Stand: Min assist         General transfer comment: Assist to bring hips up and for balance.    Balance Overall balance assessment: Needs assistance Sitting-balance support: Feet supported;No upper extremity supported Sitting balance-Leahy Scale: Good     Standing balance support: During functional activity;Bilateral upper extremity supported Standing balance-Leahy Scale: Poor Standing balance comment: walker and min guard for static standing                            ADL either performed or assessed with clinical judgement   ADL Overall ADL's : Needs assistance/impaired     Grooming: Set up;Sitting;Wash/dry hands               Lower Body Dressing: Minimal assistance;Sit to/from stand Lower Body Dressing Details (indicate cue type and reason): pt performing figure 4 technique without significant difficulty while seated in recliner, doffing/donning sock while doing so Toilet Transfer: Minimal assistance;Ambulation;RW Toilet Transfer Details (indicate cue type and reason): pt seated on toilet in bathroom upon entry; assist to boost from toilet and minA for steadying during room level mobility Toileting- Clothing Manipulation and Hygiene: Min guard;Sit to/from stand Toileting - Clothing Manipulation Details (indicate cue type and reason): pt performing pericare while seated without therapist assist     Functional mobility during ADLs: Minimal assistance;Rolling walker General ADL Comments: pt performing room level mobility, completed additional seated UB/LB exercise for continued edema management and strengthening/endurance, educated on edema management     Vision       Perception     Praxis      Cognition Arousal/Alertness: Awake/alert Behavior During Therapy: WFL for tasks assessed/performed Overall Cognitive Status: Within Functional Limits for tasks assessed                                 General Comments: WFL for simple tasks; likely baseline cognition        Exercises Exercises: General Upper Extremity;General Lower Extremity;Other  exercises General Exercises - Upper Extremity Shoulder Flexion: AROM;20 reps;Both;Seated General Exercises - Lower Extremity Ankle Circles/Pumps: 15 reps;Both;Seated Other Exercises Other Exercises: seated leg extension x10 bil LEs, x2 reps   Shoulder Instructions       General Comments VSS including HR and SpO2 on RA    Pertinent Vitals/ Pain       Pain  Assessment: No/denies pain  Home Living                                          Prior Functioning/Environment              Frequency  Min 2X/week        Progress Toward Goals  OT Goals(current goals can now be found in the care plan section)  Progress towards OT goals: Progressing toward goals  Acute Rehab OT Goals Patient Stated Goal: Get stronger at SNF and return home OT Goal Formulation: With patient Time For Goal Achievement: 01/24/18 Potential to Achieve Goals: Good  Plan Discharge plan remains appropriate    Co-evaluation                 AM-PAC OT "6 Clicks" Daily Activity     Outcome Measure   Help from another person eating meals?: None Help from another person taking care of personal grooming?: None(seated) Help from another person toileting, which includes using toliet, bedpan, or urinal?: A Little Help from another person bathing (including washing, rinsing, drying)?: A Lot Help from another person to put on and taking off regular upper body clothing?: None Help from another person to put on and taking off regular lower body clothing?: A Lot 6 Click Score: 19    End of Session Equipment Utilized During Treatment: Gait belt;Rolling walker  OT Visit Diagnosis: Other abnormalities of gait and mobility (R26.89);Muscle weakness (generalized) (M62.81)   Activity Tolerance Patient tolerated treatment well   Patient Left in chair;with call bell/phone within reach;with chair alarm set   Nurse Communication Mobility status        Time: 3825-0539 OT Time Calculation (min): 28 min  Charges: OT General Charges $OT Visit: 1 Visit OT Treatments $Self Care/Home Management : 8-22 mins $Therapeutic Activity: 8-22 mins  Phillip Wilson, OT [facility] Pager [phone] Office [phone]   Phillip Wilson 01/12/2018, 4:07 PM

## 2018-01-12 NOTE — Progress Notes (Signed)
[place] TEAM 1 - Stepdown/ICU TEAM  Phillip Wilson  WIO:[id number] DOB: 02-01-1945 DOA: 12/28/2017 PCP: Alycia Rossetti, MD    Brief Narrative:  72 y.o.malew/ a hx of nonischemic cardiomyopathy (EF 15-20%; 2017), DM2 with nephropathy, CKD stage III, HTN, GERD,  HLD, and paroxysmal atrial fibrillation who presented with shortness of breath and worsening swelling for a week.Patient reported running out of his medication approximately6-7days prior to hospitalization, and admitted noncompliance with a low-sodium diet.   Subjective: The patient is sitting up in a bedside chair.  He denies shortness of breath chest pain nausea or vomiting.  He states he feels better overall.  He continues to have significant lower extremity edema.  Assessment & Plan:  Acute hypoxic respiratory failure with hypoxia - CHF + aspiration pneumonia intubated 12/2  > 12/5 - completed antibiotic therapy using Unasyn and Augmentin - resp status now back to baseline with patient on room air  Acute on chronic systolic HF  ejection fraction 15-20% - CHF Team directing care - reasonably well compensated at this time -given slight worsening of lower extremity edema and improvement in renal function will resume lower dose diuretic today  Filed Weights   01/09/18 0517 01/10/18 0453 01/11/18 0745  Weight: 95.3 kg 93.4 kg 93.4 kg    A. fib with RVR TEE with cardioversion completed 12/16 - continue amiodarone - continue edoxaban - care per Cards -remains in sinus rhythm with modest bradycardia present -beta-blocker currently on hold -will be addressed by cardiology in outpatient follow-up  Acute kidney injury on stage 3 CKD Most c/w cardiorenal syndrome - crt appears to have stabilized -resume diuresis today and follow trend  Recent Labs  Lab 01/07/18 2209 01/08/18 1430 01/09/18 0511 01/10/18 0651 01/12/18 0444  CREATININE 2.71* 2.48* 2.32* 2.17* 2.05*    Hyperlipidemia continue statin  DM2 with  nephropathy last A1C 6.6 - CBG well controlled at this time   GERD continue PPI  L hand gout flare stopped prednisone 12/16 - sx now resolved   Hematuria Urology felt most likely irritation by foley w/ use of anticoagulation and underlying BPH - foley removed on 12/10 and patient able to urinate  DVT prophylaxis: edoxaban  Code Status: FULL CODE Family Communication: no family present at time of exam  Disposition Plan: Resuming diuretic -follow renal function -follow daily weights -potential for SNF discharge within 24-48 hours when bed available  Consultants:  PCCM CHF Team  EP Urology   Antimicrobials:  None presently   Objective: Blood pressure 110/70, pulse (!) 58, temperature 98 F (36.7 C), temperature source Oral, resp. rate 18, height 5\' 8"  (1.727 m), weight 93.4 kg, SpO2 99 %.  Intake/Output Summary (Last 24 hours) at 01/12/2018 1030 Last data filed at 01/12/2018 0744 Gross per 24 hour  Intake 864.29 ml  Output 1050 ml  Net -185.71 ml   Filed Weights   01/09/18 0517 01/10/18 0453 01/11/18 0745  Weight: 95.3 kg 93.4 kg 93.4 kg    Examination: General: No acute respiratory distress - A&O Lungs: Clear to auscultation B - no wheezing  Cardiovascular: brady - regular - no M or rub  Abdomen: NT/ND, soft, bs+, no mass  Extremities: 2+edema bilateral lower extremities  CBC: Recent Labs  Lab 01/09/18 0500 01/10/18 0651 01/12/18 0444  WBC 9.6 10.4 9.1  NEUTROABS 8.6* 9.1*  --   HGB 13.7 13.3 12.2*  HCT 43.9 42.1 39.5  MCV 81.4 81.6 82.3  PLT 111* 113* 426*   Basic Metabolic Panel:  Recent Labs  Lab 01/09/18 0511 01/10/18 0651 01/12/18 0444  NA 137 137 137  K 4.2 4.2 4.4  CL 94* 96* 98  CO2 30 31 30   GLUCOSE 124* 128* 126*  BUN 62* 59* 50*  CREATININE 2.32* 2.17* 2.05*  CALCIUM 7.9* 8.0* 8.0*  MG 2.2 2.2 2.3  PHOS 3.2 2.5  --    GFR: Estimated Creatinine Clearance: 37.2 mL/min (A) (by C-G formula based on SCr of 2.05 mg/dL (H)).  Liver  Function Tests: Recent Labs  Lab 01/09/18 0511 01/10/18 0651 01/12/18 0444  AST  --   --  55*  ALT  --   --  33  ALKPHOS  --   --  123  BILITOT  --   --  1.1  PROT  --   --  4.7*  ALBUMIN 2.2* 2.0* 1.9*    HbA1C: Hgb A1c MFr Bld  Date/Time Value Ref Range Status  12/28/2017 06:12 PM 6.6 (H) 4.8 - 5.6 % Final    Comment:    (NOTE) Pre diabetes:          5.7%-6.4% Diabetes:              >6.4% Glycemic control for   <7.0% adults with diabetes   12/22/2017 12:09 PM 6.6 (H) <5.7 % of total Hgb Final    Comment:    For someone without known diabetes, a hemoglobin A1c value of 6.5% or greater indicates that they may have  diabetes and this should be confirmed with a follow-up  test. . For someone with known diabetes, a value <7% indicates  that their diabetes is well controlled and a value  greater than or equal to 7% indicates suboptimal  control. A1c targets should be individualized based on  duration of diabetes, age, comorbid conditions, and  other considerations. . Currently, no consensus exists regarding use of hemoglobin A1c for diagnosis of diabetes for children. .     CBG: Recent Labs  Lab 01/11/18 0921 01/11/18 1158 01/11/18 1637 01/11/18 2148 01/12/18 0742  GLUCAP 97 141* 167* 159* 105*    Recent Results (from the past 240 hour(s))  Culture, blood (Routine X 2) w Reflex to ID Panel     Status: None   Collection Time: 01/04/18 10:53 AM  Result Value Ref Range Status   Specimen Description LEFT ANTECUBITAL  Final   Special Requests   Final    BOTTLES DRAWN AEROBIC ONLY Blood Culture adequate volume   Culture   Final    NO GROWTH 5 DAYS Performed at [facility], [address], [place], [place] [address]    Report Status 01/09/2018 FINAL  Final  Culture, blood (Routine X 2) w Reflex to ID Panel     Status: None   Collection Time: 01/04/18 11:00 AM  Result Value Ref Range Status   Specimen Description BLOOD LEFT HAND  Final   Special Requests    Final    BOTTLES DRAWN AEROBIC ONLY Blood Culture adequate volume   Culture   Final    NO GROWTH 5 DAYS Performed at [facility], [address] [place], [place] [address]    Report Status 01/09/2018 FINAL  Final  Culture, Urine     Status: None   Collection Time: 01/04/18  1:06 PM  Result Value Ref Range Status   Specimen Description   Final    URINE, CATHETERIZED Performed at [facility], [address], [place], [place] [address]    Special Requests  Final    NONE Performed at [facility], [address] [place], [place] [address]    Culture   Final    NO GROWTH Performed at [facility], [place] [address], [place], [place] [address]    Report Status 01/06/2018 FINAL  Final     Scheduled Meds: . amiodarone  400 mg Oral BID  . aspirin  81 mg Oral Daily  . atorvastatin  40 mg Oral q1800  . Chlorhexidine Gluconate Cloth  6 each Topical Daily  . edoxaban  30 mg Oral Q24H  . feeding supplement (ENSURE ENLIVE)  237 mL Oral BID BM  . insulin aspart  0-9 Units Subcutaneous TID WC  . pantoprazole  40 mg Oral Daily  . potassium chloride  40 mEq Oral Daily  . sodium chloride flush  3 mL Intravenous Q12H   Continuous Infusions: . sodium chloride Stopped (01/11/18 0845)     LOS: 15 days   Cherene Altes, MD [facility] Office  [phone] Pager - Text Page per Amion  If 7PM-7AM, please contact night-coverage per Amion 01/12/2018, 10:30 AM

## 2018-01-13 DIAGNOSIS — I5023 Acute on chronic systolic (congestive) heart failure: Secondary | ICD-10-CM | POA: Diagnosis not present

## 2018-01-13 DIAGNOSIS — I639 Cerebral infarction, unspecified: Secondary | ICD-10-CM | POA: Diagnosis not present

## 2018-01-13 DIAGNOSIS — I10 Essential (primary) hypertension: Secondary | ICD-10-CM | POA: Diagnosis not present

## 2018-01-13 DIAGNOSIS — I5043 Acute on chronic combined systolic (congestive) and diastolic (congestive) heart failure: Secondary | ICD-10-CM | POA: Diagnosis not present

## 2018-01-13 DIAGNOSIS — E785 Hyperlipidemia, unspecified: Secondary | ICD-10-CM | POA: Diagnosis not present

## 2018-01-13 DIAGNOSIS — J9622 Acute and chronic respiratory failure with hypercapnia: Secondary | ICD-10-CM | POA: Diagnosis not present

## 2018-01-13 DIAGNOSIS — K219 Gastro-esophageal reflux disease without esophagitis: Secondary | ICD-10-CM | POA: Diagnosis not present

## 2018-01-13 DIAGNOSIS — E1121 Type 2 diabetes mellitus with diabetic nephropathy: Secondary | ICD-10-CM | POA: Diagnosis not present

## 2018-01-13 DIAGNOSIS — I480 Paroxysmal atrial fibrillation: Secondary | ICD-10-CM | POA: Diagnosis not present

## 2018-01-13 DIAGNOSIS — N183 Chronic kidney disease, stage 3 (moderate): Secondary | ICD-10-CM | POA: Diagnosis not present

## 2018-01-13 DIAGNOSIS — I69922 Dysarthria following unspecified cerebrovascular disease: Secondary | ICD-10-CM | POA: Diagnosis not present

## 2018-01-13 DIAGNOSIS — Z823 Family history of stroke: Secondary | ICD-10-CM | POA: Diagnosis not present

## 2018-01-13 DIAGNOSIS — I429 Cardiomyopathy, unspecified: Secondary | ICD-10-CM | POA: Diagnosis not present

## 2018-01-13 DIAGNOSIS — R262 Difficulty in walking, not elsewhere classified: Secondary | ICD-10-CM | POA: Diagnosis not present

## 2018-01-13 DIAGNOSIS — I69891 Dysphagia following other cerebrovascular disease: Secondary | ICD-10-CM | POA: Diagnosis not present

## 2018-01-13 DIAGNOSIS — M6281 Muscle weakness (generalized): Secondary | ICD-10-CM | POA: Diagnosis not present

## 2018-01-13 LAB — GLUCOSE, CAPILLARY
Glucose-Capillary: 78 mg/dL (ref 70–99)
Glucose-Capillary: 131 mg/dL — ABNORMAL HIGH (ref 70–99)

## 2018-01-13 LAB — BASIC METABOLIC PANEL
Anion gap: 9 (ref 5–15)
BUN: 49 mg/dL — ABNORMAL HIGH (ref 8–23)
CO2: 32 mmol/L (ref 22–32)
Calcium: 7.9 mg/dL — ABNORMAL LOW (ref 8.9–10.3)
Chloride: 98 mmol/L (ref 98–111)
Creatinine, Ser: 1.98 mg/dL — ABNORMAL HIGH (ref 0.61–1.24)
GFR calc Af Amer: 39 mL/min — ABNORMAL LOW (ref >60)
GFR calc non Af Amer: 33 mL/min — ABNORMAL LOW (ref >60)
Glucose, Bld: 96 mg/dL (ref 70–99)
Potassium: 4.4 mmol/L (ref 3.5–5.1)
Sodium: 139 mmol/L (ref 135–145)

## 2018-01-13 MED ORDER — AMIODARONE HCL 400 MG PO TABS: 400.0000 mg | ORAL_TABLET | Freq: Two times a day (BID) | ORAL | 0 refills | Status: DC

## 2018-01-13 MED ORDER — ENSURE ENLIVE PO LIQD: 237.0000 mL | Freq: Two times a day (BID) | ORAL | 0 refills | Status: DC

## 2018-01-13 MED ORDER — PANTOPRAZOLE SODIUM 40 MG PO TBEC: 40.0000 mg | DELAYED_RELEASE_TABLET | Freq: Every day | ORAL | 0 refills | Status: DC

## 2018-01-13 NOTE — Telephone Encounter (Signed)
Received fax from [facility] for Medical Disability forms.   Call placed to patient daughter Crystal for more information.   Job title: Development worker, international aid- Insurance underwriter: clean floors, custodial work  Reason requested: recent hospitalization-  CHF exacerbation, acute respiratory failure, ARF  Requested Beginning Date: 12/28/2017 Return to Work Date: unknown- patient is being discharged to [facility] on 01/13/2018.  Verbalized that fee may be charged and is per provider prerogative.   Forms routed to provider.

## 2018-01-13 NOTE — Social Work (Signed)
[facility] has insurance approval for patient to admit to their facility today. Notified MD. CSW to support with discharge when patient medically ready.  [name], LCSW [phone]

## 2018-01-13 NOTE — Progress Notes (Signed)
Called report to [place], receiving nurse at [facility]. All questions answered. Patient is awaiting [facility] for transport.

## 2018-01-13 NOTE — Clinical Social Work Placement (Signed)
   CLINICAL SOCIAL WORK PLACEMENT  NOTE  Date:  01/13/2018  Patient Details  Name: Phillip Wilson MRN: [id number] Date of Birth: 1945-02-14  Clinical Social Work is seeking post-discharge placement for this patient at the [place] level of care (*CSW will initial, date and re-position this form in  chart as items are completed):  Yes   Patient/family provided with [facility] Work Department's list of facilities offering this level of care within the geographic area requested by the patient (or if unable, by the patient's family).  Yes   Patient/family informed of their freedom to choose among providers that offer the needed level of care, that participate in Medicare, Medicaid or managed care program needed by the patient, have an available bed and are willing to accept the patient.  Yes   Patient/family informed of [facility]'s ownership interest in [facility] and [facility], as well as of the fact that they are under no obligation to receive care at these facilities.  PASRR submitted to EDS on 01/01/18     PASRR number received on 01/01/18     Existing PASRR number confirmed on       FL2 transmitted to all facilities in geographic area requested by pt/family on 01/01/18     FL2 transmitted to all facilities within larger geographic area on       Patient informed that his/her managed care company has contracts with or will negotiate with certain facilities, including the following:  [facility]     Yes   Patient/family informed of bed offers received.  Patient chooses bed at [facility]     Physician recommends and patient chooses bed at      Patient to be transferred to [facility] on 01/13/18.  Patient to be transferred to facility by PTAR     Patient family notified on 01/13/18 of transfer.  Name of family member notified:        PHYSICIAN Please prepare prescriptions     Additional Comment:     _______________________________________________ [name], LCSW 01/13/2018, 12:31 PM

## 2018-01-13 NOTE — Discharge Instructions (Signed)
Acute Kidney Injury, Adult ° °Acute kidney injury is a sudden worsening of kidney function. The kidneys are organs that have several jobs. They filter the blood to remove waste products and extra fluid. They also maintain a healthy balance of minerals and hormones in the body, which helps control blood pressure and keep bones strong. With this condition, your kidneys do not do their jobs as well as they should. °This condition ranges from mild to severe. Over time it may develop into long-lasting (chronic) kidney disease. Early detection and treatment may prevent acute kidney injury from developing into a chronic condition. °What are the causes? °Common causes of this condition include: °· A problem with blood flow to the kidneys. This may be caused by: °? Low blood pressure (hypotension) or shock. °? Blood loss. °? Heart and blood vessel (cardiovascular) disease. °? Severe burns. °? Liver disease. °· Direct damage to the kidneys. This may be caused by: °? Certain medicines. °? A kidney infection. °? Poisoning. °? Being around or in contact with toxic substances. °? A surgical wound. °? A hard, direct hit to the kidney area. °· A sudden blockage of urine flow. This may be caused by: °? Cancer. °? Kidney stones. °? An enlarged prostate in males. °What are the signs or symptoms? °Symptoms of this condition may not be obvious until the condition becomes severe. Symptoms of this condition can include: °· Tiredness (lethargy), or difficulty staying awake. °· Nausea or vomiting. °· Swelling (edema) of the face, legs, ankles, or feet. °· Problems with urination, such as: °? Abdominal pain, or pain along the side of your stomach (flank). °? Decreased urine production. °? Decrease in the force of urine flow. °· Muscle twitches and cramps, especially in the legs. °· Confusion or trouble concentrating. °· Loss of appetite. °· Fever. °How is this diagnosed? °This condition may be diagnosed with tests, including: °· Blood  tests. °· Urine tests. °· Imaging tests. °· A test in which a sample of tissue is removed from the kidneys to be examined under a microscope (kidney biopsy). °How is this treated? °Treatment for this condition depends on the cause and how severe the condition is. In mild cases, treatment may not be needed. The kidneys may heal on their own. In more severe cases, treatment will involve: °· Treating the cause of the kidney injury. This may involve changing any medicines you are taking or adjusting your dosage. °· Fluids. You may need specialized IV fluids to balance your body's needs. °· Having a catheter placed to drain urine and prevent blockages. °· Preventing problems from occurring. This may mean avoiding certain medicines or procedures that can cause further injury to the kidneys. °In some cases treatment may also require: °· A procedure to remove toxic wastes from the body (dialysis or continuous renal replacement therapy - CRRT). °· Surgery. This may be done to repair a torn kidney, or to remove the blockage from the urinary system. °Follow these instructions at home: °Medicines °· Take over-the-counter and prescription medicines only as told by your health care provider. °· Do not take any new medicines without your health care provider's approval. Many medicines can worsen your kidney damage. °· Do not take any vitamin and mineral supplements without your health care provider's approval. Many nutritional supplements can worsen your kidney damage. °Lifestyle °· If your health care provider prescribed changes to your diet, follow them. You may need to decrease the amount of protein you eat. °· Achieve and maintain a healthy   weight. If you need help with this, ask your health care provider.  Start or continue an exercise plan. Try to exercise at least 30 minutes a day, 5 days a week.  Do not use any tobacco products, such as cigarettes, chewing tobacco, and e-cigarettes. If you need help quitting, ask your  health care provider. General instructions  Keep track of your blood pressure. Report changes in your blood pressure as told by your health care provider.  Stay up to date with immunizations. Ask your health care provider which immunizations you need.  Keep all follow-up visits as told by your health care provider. This is important. Where to find more information  American Association of Kidney Patients: BombTimer.gl  National Kidney Foundation: www.kidney.[place]: https://mathis.com/  [facility]: ? www.lifeoptions.org ? www.kidneyschool.org Contact a health care provider if:  Your symptoms get worse.  You develop new symptoms. Get help right away if:  You develop symptoms of worsening kidney disease, which include: ? Headaches. ? Abnormally dark or light skin. ? Easy bruising. ? Frequent hiccups. ? Chest pain. ? Shortness of breath. ? End of menstruation in women. ? Seizures. ? Confusion or altered mental status. ? Abdominal or back pain. ? Itchiness.  You have a fever.  Your body is producing less urine.  You have pain or bleeding when you urinate. Summary  Acute kidney injury is a sudden worsening of kidney function.  Acute kidney injury can be caused by problems with blood flow to the kidneys, direct damage to the kidneys, and sudden blockage of urine flow.  Symptoms of this condition may not be obvious until it becomes severe. Symptoms may include edema, lethargy, confusion, nausea or vomiting, and problems passing urine.  This condition can usually be diagnosed with blood tests, urine tests, and imaging tests. Sometimes a kidney biopsy is done to diagnose this condition.  Treatment for this condition often involves treating the underlying cause. It is treated with fluids, medicines, dialysis, diet changes, or surgery. This information is not intended to replace advice given to you by your health care provider. Make  sure you discuss any questions you have with your health care provider. Document Released: 07/29/2010 Document Revised: 05/15/2016 Document Reviewed: 01/04/2016 Elsevier Interactive Patient Education  2019 [place].   Heart Failure Action Plan A heart failure action plan helps you understand what to do when you have symptoms of heart failure. Follow the plan that was created by you and your health care provider. Review your plan each time you visit your health care provider. Red zone These signs and symptoms mean you should get medical help right away:  You have trouble breathing when resting.  You have a dry cough that is getting worse.  You have swelling or pain in your legs or abdomen that is getting worse.  You suddenly gain more than 2-3 lb (0.9-1.4 kg) in a day, or more than 5 lb (2.3 kg) in one week. This amount may be more or less depending on your condition.  You have trouble staying awake or you feel confused.  You have chest pain.  You do not have an appetite.  You pass out. If you experience any of these symptoms:  Call your local emergency services (911 in the [place] right away or seek help at the emergency department of the nearest hospital. Yellow zone These signs and symptoms mean your condition may be getting worse and you should make some changes:  You have trouble  breathing when you are active or you need to sleep with extra pillows.  You have swelling in your legs or abdomen.  You gain 2-3 lb (0.9-1.4 kg) in one day, or 5 lb (2.3 kg) in one week. This amount may be more or less depending on your condition.  You get tired easily.  You have trouble sleeping.  You have a dry cough. If you experience any of these symptoms:  Contact your health care provider within the next day.  Your health care provider may adjust your medicines. Green zone These signs mean you are doing well and can continue what you are doing:  You do not have shortness of  breath.  You have very little swelling or no new swelling.  Your weight is stable (no gain or loss).  You have a normal activity level.  You do not have chest pain or any other new symptoms. Follow these instructions at home:  Take over-the-counter and prescription medicines only as told by your health care provider.  Weigh yourself daily. Your target weight is __________ lb (__________ kg). ? Call your health care provider if you gain more than __________ lb (__________ kg) in a day, or more than __________ lb (__________ kg) in one week.  Eat a heart-healthy diet. Work with a diet and nutrition specialist (dietitian) to create an eating plan that is best for you.  Keep all follow-up visits as told by your health care provider. This is important. Where to find more information  American Heart Association: www.heart.org Summary  Follow the action plan that was created by you and your health care provider.  Get help right away if you have any symptoms in the Red zone. This information is not intended to replace advice given to you by your health care provider. Make sure you discuss any questions you have with your health care provider. Document Released: 02/23/2016 Document Revised: 09/17/2016 Document Reviewed: 02/23/2016 Elsevier Interactive Patient Education  2019 [employer]   Heart Failure Exacerbation  Heart failure is a condition in which the heart does not fill up with enough blood, and therefore does not pump enough blood and oxygen to the body. When this happens, parts of the body do not get the blood and oxygen they need to function properly. This can cause symptoms such as breathing problems, fatigue, swelling, and confusion. Heart failure exacerbation refers to heart failure symptoms that get worse. The symptoms may get worse suddenly or develop slowly over time. Heart failure exacerbation is a serious medical problem that should be treated right away. What are the  causes? A heart failure exacerbation can be triggered by:  Not taking your heart failure medicines correctly.  Infections.  Eating an unhealthy diet or a diet that is high in salt (sodium).  Drinking too much fluid.  Drinking alcohol.  Taking illegal drugs, such as cocaine or methamphetamine.  Not exercising. Other causes include:  Other heart conditions such as an irregular heartbeat (arrhythmia).  Anemia.  Other medical problems, such as kidney failure. Sometimes the cause of the exacerbation is not known. What are the signs or symptoms? When heart failure symptoms suddenly or slowly get worse, this may be a sign of heart failure exacerbation. Symptoms of heart failure include:  Breathing problems or shortness of breath.  Chronic coughing or wheezing.  Fatigue.  Nausea or lack of appetite.  Feeling light-headed.  Confusion or memory loss.  Increased heart rate or irregular heartbeat.  Buildup of fluid in the legs,  ankles, feet, or abdomen.  Difficulty breathing when lying down. How is this diagnosed? This condition is diagnosed based on:  Your symptoms and medical history.  A physical exam. You may also have tests, including:  Electrocardiogram (ECG). This test measures the electrical activity of your heart.  Echocardiogram. This test uses sound waves to take a picture of your heart to see how well it works.  Blood tests.  Imaging tests, such as: ? Chest X-ray. ? MRI. ? Ultrasound.  Stress test. This test examines how well your heart functions when you exercise. Your heart is monitored while you exercise on a treadmill or exercise bike. If you cannot exercise, medicines may be used to increase your heartbeat in place of exercise.  Cardiac catheterization. During this test, a thin, flexible tube (catheter) is inserted into a blood vessel and threaded up to your heart. This test allows your health care provider to check the arteries that lead to your  heart (coronary arteries).  Right heart catheterization. During this test, the pressure in your heart is measured. How is this treated? This condition may be treated by:  Adjusting your heart medicines.  Maintaining a healthy lifestyle. This includes: ? Eating a heart-healthy diet that is low in sodium. ? Not using any products that contain nicotine or tobacco, such as cigarettes and e-cigarettes. ? Regular exercise. ? Monitoring your fluid intake. ? Monitoring your weight and reporting changes to your health care provider.  Treating sleep apnea, if you have this condition.  Surgery. This may include: ? Implanting a device that helps both sides of your heart contract at the same time (cardiac resynchronization therapy device). This can help with heart function and relieve heart failure symptoms. ? Implanting a device that can correct heart rhythm problems (implantable cardioverter defibrillator). ? Connecting a device to your heart to help it pump blood (ventricular assist device). ? Heart transplant. Follow these instructions at home: Medicines  Take over-the-counter and prescription medicines only as told by your health care provider.  Do not stop taking your medicines or change the amount you take. If you are having problems or side effects from your medicines, talk to your health care provider.  If you are having difficulty paying for your medicines, contact a social worker or your clinic. There are many programs to assist with medicine costs.  Talk to your health care provider before starting any new medicines or supplements.  Make sure your health care provider and pharmacist have a list of all the medicines you are taking. Eating and drinking   Avoid drinking alcohol.  Eat a heart-healthy diet as told by your health care provider. This includes: ? Plenty of fruits and vegetables. ? Lean proteins. ? Low-fat dairy. ? Whole grains. ? Foods that are low in  sodium. Activity   Exercise regularly as told by your health care provider. Balance exercise with rest.  Ask your health care provider what activities are safe for you. This includes sexual activity, exercise, and daily tasks at home or work. Lifestyle  Do not use any products that contain nicotine or tobacco, such as cigarettes and e-cigarettes. If you need help quitting, ask your health care provider.  Maintain a healthy weight. Ask your health care provider what weight is healthy for you.  Consider joining a patient support group. This can help with emotional problems you may have, such as stress and anxiety. General instructions  Talk to your health care provider about flu and pneumonia vaccines.  Keep a  list of medicines that you are taking. This may help in emergency situations.  Keep all follow-up visits as told by your health care provider. This is important. Contact a health care provider if:  You have questions about your medicines or you miss a dose.  You feel anxious, depressed, or stressed.  You have swelling in your feet, ankles, legs, or abdomen.  You have shortness of breath during activity or exercise.  You have a cough.  You have a fever.  You have trouble sleeping.  You gain 2-3 lb (1-1.4 kg) in 24 hours or 5 lb (2.3 kg) in a week. Get help right away if:  You have chest pain.  You have shortness of breath while resting.  You have severe fatigue.  You are confused.  You have severe dizziness.  You have a rapid or irregular heartbeat.  You have nausea or you vomit.  You have a cough that is worse at night or you cannot lie flat.  You have a cough that will not go away.  You have severe depression or sadness. Summary  When heart failure symptoms get worse, it is called heart failure exacerbation.  Common causes of this condition include taking medicines incorrectly, infections, and drinking alcohol.  This condition may be treated by  adjusting medicines, maintaining a healthy lifestyle, or surgery.  Do not stop taking your medicines or change the amount you take. If you are having problems or side effects from your medicines, talk to your health care provider. This information is not intended to replace advice given to you by your health care provider. Make sure you discuss any questions you have with your health care provider. Document Released: 05/27/2016 Document Revised: 05/27/2016 Document Reviewed: 05/27/2016 Elsevier Interactive Patient Education  2019 [place].   Heart Failure Eating Plan Heart failure, also called congestive heart failure, occurs when your heart does not pump blood well enough to meet your body's needs for oxygen-rich blood. Heart failure is a long-term (chronic) condition. Living with heart failure can be challenging. However, following your health care provider's instructions about a healthy lifestyle and working with a diet and nutrition specialist (dietitian) to choose the right foods may help to improve your symptoms. What are tips for following this plan? General guidelines  Do not eat more than 2,300 mg of salt (sodium) a day. The amount of sodium that is recommended for you may be lower, depending on your condition.  Maintain a healthy body weight as directed. Ask your health care provider what a healthy weight is for you. ? Check your weight every day. ? Work with your health care provider and dietitian to make a plan that is right for you to lose weight or maintain your current weight.  Limit how much fluid you drink. Ask your health care provider or dietitian how much fluid you can have each day.  Limit or avoid alcohol as told by your health care provider or dietitian. Reading food labels  Check food labels for the amount of sodium per serving. Choose foods that have less than 140 mg (milligrams) of sodium in each serving.  Check food labels for the number of calories per  serving. This is important if you need to limit your daily calorie intake to lose weight.  Check food labels for the serving size. If you eat more than one serving, you will be eating more sodium and calories than what is listed on the label.  Look for foods that are labeled  as "sodium-free," "very low sodium," or "low sodium." ? Foods labeled as "reduced sodium" or "lightly salted" may still have more sodium than what is recommended for you. Cooking  Avoid adding salt when cooking. Ask your health care provider or dietitian before using salt substitutes.  Season food with salt-free seasonings, spices, or herbs. Check the label of seasoning mixes to make sure they do not contain salt.  Cook with heart-healthy oils, such as olive, canola, soybean, or sunflower oil.  Do not fry foods. Cook foods using low-fat methods, such as baking, boiling, grilling, and broiling.  Limit unhealthy fats when cooking by: ? Removing the skin from poultry, such as chicken. ? Removing all visible fats from meats. ? Skimming the fat off from stews, soups, and gravies before serving them. Meal planning   Limit your intake of: ? Processed, canned, or pre-packaged foods. ? Foods that are high in trans fat, such as fried foods. ? Sweets, desserts, sugary drinks, and other foods with added sugar. ? Full-fat dairy products, such as whole milk.  Eat a balanced diet that includes: ? 4-5 servings of fruit each day and 4-5 servings of vegetables each day. At each meal, try to fill half of your plate with fruits and vegetables. ? Up to 6-8 servings of whole grains each day. ? Up to 2 servings of lean meat, poultry, or fish each day. One serving of meat is equal to 3 oz. This is about the same size as a deck of cards. ? 2 servings of low-fat dairy each day. ? Heart-healthy fats. Healthy fats called omega-3 fatty acids are found in foods such as flaxseed and cold-water fish like sardines, salmon, and mackerel.  Aim  to eat 25-35 g (grams) of fiber a day. Foods that are high in fiber include apples, broccoli, carrots, beans, peas, and whole grains.  Do not add salt or condiments that contain salt (such as soy sauce) to foods before eating.  When eating at a restaurant, ask that your food be prepared with less salt or no salt, if possible.  Try to eat 2 or more vegetarian meals each week.  Eat more home-cooked food and eat less restaurant, buffet, and fast food. Recommended foods The items listed may not be a complete list. Talk with your dietitian about what dietary choices are best for you. Grains Bread with less than 80 mg of sodium per slice. Whole-wheat pasta, quinoa, and brown rice. Oats and oatmeal. Barley. [place]. Grits and cream of wheat. Whole-grain and whole-wheat cold cereal. Vegetables All fresh vegetables. Vegetables that are frozen without sauce or added salt. Low-sodium or sodium-free canned vegetables. Fruits All fresh, frozen, and canned fruits. Dried fruits, such as raisins, prunes, and cranberries. Meats and other protein foods Lean cuts of meat. Skinless chicken and [place]. Fish with high omega-3 fatty acids, such as salmon, sardines, and other cold-water fishes. Eggs. Dried beans, peas, and edamame. Unsalted nuts and nut butters. Dairy Low-fat or nonfat (skim) milk and dried milk. Rice milk, soy milk, and almond milk. Low-fat or nonfat yogurt. Small amounts of reduced-sodium block cheese. Low-sodium cottage cheese. Fats and oils Olive, canola, soybean, flaxseed, or sunflower oil. Avocado. Sweets and desserts Apple sauce. Granola bars. Sugar-free pudding and gelatin. Frozen fruit bars. Seasoning and other foods Fresh and dried herbs. Lemon or lime juice. Vinegar. Low-sodium ketchup. Salt-free marinades, salad dressings, sauces, and seasonings. Foods to avoid The items listed may not be a complete list. Talk with your dietitian about what dietary  choices are best for  you. Grains Bread with more than 80 mg of sodium per slice. Hot or cold cereal with more than 140 mg sodium per serving. Salted pretzels and crackers. Pre-packaged breadcrumbs. Bagels, croissants, and biscuits. Vegetables Canned vegetables. Frozen vegetables with sauce or seasonings. Creamed vegetables. [place] fries. Onion rings. Pickled vegetables and sauerkraut. Fruits Fruits that are dried with sodium-containing preservatives. Meats and other protein foods Ribs and chicken wings. Bacon, ham, pepperoni, bologna, salami, and packaged luncheon meats. Hot dogs, bratwurst, and sausage. Canned meat. Smoked meat and fish. Salted nuts and seeds. Dairy Whole milk, half-and-half, and cream. Buttermilk. Processed cheese, cheese spreads, and cheese curds. Regular cottage cheese. Feta cheese. Shredded cheese. String cheese. Fats and oils Butter, lard, shortening, ghee, and bacon fat. Canned and packaged gravies. Seasoning and other foods Onion salt, garlic salt, table salt, and sea salt. Marinades. Regular salad dressings. Relishes, pickles, and olives. Meat flavorings and tenderizers, and bouillon cubes. Horseradish, ketchup, and mustard. Worcestershire sauce. Teriyaki sauce, soy sauce (including reduced sodium). Hot sauce and Tabasco sauce. Steak sauce, fish sauce, oyster sauce, and cocktail sauce. Taco seasonings. Barbecue sauce. Tartar sauce. Summary  A heart failure eating plan includes changes that limit your intake of sodium and unhealthy fat, and it may help you lose weight or maintain a healthy weight. Your health care provider may also recommend limiting how much fluid you drink.  Most people with heart failure should eat no more than 2,300 mg of salt (sodium) a day. The amount of sodium that is recommended for you may be lower, depending on your condition.  Contact your health care provider or dietitian before making any major changes to your diet. This information is not intended to replace  advice given to you by your health care provider. Make sure you discuss any questions you have with your health care provider. Document Released: 05/30/2016 Document Revised: 05/30/2016 Document Reviewed: 05/30/2016 Elsevier Interactive Patient Education  2019 [employer].
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
## 2018-01-13 NOTE — Telephone Encounter (Signed)
noted 

## 2018-01-13 NOTE — Social Work (Signed)
SNF has insurance authorization for patient to admit today.  Patient will discharge to [facility]. Anticipated discharge date: 01/13/18 Family notified: called daughter, Donella Stade, but no Personnel officer by: PTAR  Nurse to call report to [phone].  CSW signing off.  [name], [place]  Clinical Social Worker

## 2018-01-14 DIAGNOSIS — Z79899 Other long term (current) drug therapy: Secondary | ICD-10-CM | POA: Diagnosis not present
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
## 2018-01-15 ENCOUNTER — Encounter: Payer: Self-pay | Admitting: Student

## 2018-01-15 ENCOUNTER — Ambulatory Visit (INDEPENDENT_AMBULATORY_CARE_PROVIDER_SITE_OTHER): Payer: BLUE CROSS/BLUE SHIELD | Admitting: Student

## 2018-01-15 VITALS — BP 122/64 | HR 71 | Ht 68.0 in | Wt 213.0 lb

## 2018-01-15 DIAGNOSIS — E782 Mixed hyperlipidemia: Secondary | ICD-10-CM | POA: Diagnosis not present

## 2018-01-15 DIAGNOSIS — Z7901 Long term (current) use of anticoagulants: Secondary | ICD-10-CM | POA: Diagnosis not present

## 2018-01-15 DIAGNOSIS — I429 Cardiomyopathy, unspecified: Secondary | ICD-10-CM | POA: Diagnosis not present

## 2018-01-15 DIAGNOSIS — N183 Chronic kidney disease, stage 3 unspecified: Secondary | ICD-10-CM

## 2018-01-15 DIAGNOSIS — I10 Essential (primary) hypertension: Secondary | ICD-10-CM

## 2018-01-15 DIAGNOSIS — K219 Gastro-esophageal reflux disease without esophagitis: Secondary | ICD-10-CM | POA: Diagnosis not present

## 2018-01-15 DIAGNOSIS — I5042 Chronic combined systolic (congestive) and diastolic (congestive) heart failure: Secondary | ICD-10-CM

## 2018-01-15 DIAGNOSIS — I480 Paroxysmal atrial fibrillation: Secondary | ICD-10-CM | POA: Diagnosis not present

## 2018-01-15 DIAGNOSIS — I5023 Acute on chronic systolic (congestive) heart failure: Secondary | ICD-10-CM | POA: Diagnosis not present

## 2018-01-15 DIAGNOSIS — E785 Hyperlipidemia, unspecified: Secondary | ICD-10-CM | POA: Diagnosis not present

## 2018-01-15 MED ORDER — AMIODARONE HCL 200 MG PO TABS: ORAL_TABLET | ORAL | 3 refills | Status: DC

## 2018-01-15 MED ORDER — METOPROLOL SUCCINATE ER 25 MG PO TB24: 12.5000 mg | ORAL_TABLET | Freq: Every day | ORAL | 3 refills | Status: DC

## 2018-01-15 NOTE — Patient Instructions (Signed)
Medication Instructions:  Your physician has recommended you make the following change in your medication:  Start Toprol XL 12.5 mg Daily  Decrease Amiodarone to 400 mg Daily on 01/20/18 the Decrease to 200 mg Daily on 02/20/18.   If you need a refill on your cardiac medications before your next appointment, please call your pharmacy.   Lab work: Your physician recommends that you return for lab work in: 1 week (BMET)   If you have labs (blood work) drawn today and your tests are completely normal, you will receive your results only by: Marland Kitchen MyChart Message (if you have MyChart) OR . A paper copy in the mail If you have any lab test that is abnormal or we need to change your treatment, we will call you to review the results.  Testing/Procedures: NONE   Follow-Up: At [facility], you and your health needs are our priority.  As part of our continuing mission to provide you with exceptional heart care, we have created designated Provider Care Teams.  These Care Teams include your primary Cardiologist (physician) and Advanced Practice Providers (APPs -  Physician Assistants and Nurse Practitioners) who all work together to provide you with the care you need, when you need it. You will need a follow up appointment in 1 months.  Please call our office 2 months in advance to schedule this appointment.  You may see Kate Sable, MD or one of the following Advanced Practice Providers on your designated Care Team:   Bernerd Pho, PA-C [facility]) . Ermalinda Barrios, PA-C ([place])  Any Other Special Instructions Will Be Listed Below (If Applicable). Thank you for choosing [facility]!

## 2018-01-15 NOTE — Telephone Encounter (Signed)
Received completed FMLA from provider.   No charge per provider.  Faxed to [employer].

## 2018-01-21 ENCOUNTER — Telehealth: Payer: Self-pay | Admitting: Family Medicine

## 2018-01-21 NOTE — Telephone Encounter (Signed)
Duplicate paperwork that has already been completed.   Message to be closed.

## 2018-01-21 NOTE — Telephone Encounter (Signed)
Received fmla ppw via fax on 01/21/2018 Will route to christina

## 2018-01-22 DIAGNOSIS — D649 Anemia, unspecified: Secondary | ICD-10-CM | POA: Diagnosis not present

## 2018-01-22 DIAGNOSIS — K219 Gastro-esophageal reflux disease without esophagitis: Secondary | ICD-10-CM | POA: Diagnosis not present

## 2018-01-22 DIAGNOSIS — Z79899 Other long term (current) drug therapy: Secondary | ICD-10-CM | POA: Diagnosis not present

## 2018-01-22 DIAGNOSIS — I5023 Acute on chronic systolic (congestive) heart failure: Secondary | ICD-10-CM | POA: Diagnosis not present

## 2018-01-22 DIAGNOSIS — I10 Essential (primary) hypertension: Secondary | ICD-10-CM | POA: Diagnosis not present

## 2018-01-22 DIAGNOSIS — N183 Chronic kidney disease, stage 3 (moderate): Secondary | ICD-10-CM | POA: Diagnosis not present

## 2018-01-22 DIAGNOSIS — E785 Hyperlipidemia, unspecified: Secondary | ICD-10-CM | POA: Diagnosis not present

## 2018-01-24 DIAGNOSIS — D649 Anemia, unspecified: Secondary | ICD-10-CM | POA: Diagnosis not present

## 2018-01-24 DIAGNOSIS — I10 Essential (primary) hypertension: Secondary | ICD-10-CM | POA: Diagnosis not present

## 2018-01-24 DIAGNOSIS — Z79899 Other long term (current) drug therapy: Secondary | ICD-10-CM | POA: Diagnosis not present

## 2018-01-28 ENCOUNTER — Encounter: Payer: Self-pay | Admitting: Family Medicine

## 2018-01-28 ENCOUNTER — Ambulatory Visit (INDEPENDENT_AMBULATORY_CARE_PROVIDER_SITE_OTHER): Payer: BLUE CROSS/BLUE SHIELD | Admitting: Family Medicine

## 2018-01-28 VITALS — BP 110/60 | HR 62 | Temp 98.2°F | Resp 16 | Ht 68.0 in | Wt 178.0 lb

## 2018-01-28 DIAGNOSIS — Z09 Encounter for follow-up examination after completed treatment for conditions other than malignant neoplasm: Secondary | ICD-10-CM | POA: Diagnosis not present

## 2018-01-28 DIAGNOSIS — R319 Hematuria, unspecified: Secondary | ICD-10-CM | POA: Diagnosis not present

## 2018-01-28 DIAGNOSIS — I429 Cardiomyopathy, unspecified: Secondary | ICD-10-CM

## 2018-01-28 DIAGNOSIS — I5042 Chronic combined systolic (congestive) and diastolic (congestive) heart failure: Secondary | ICD-10-CM

## 2018-01-28 DIAGNOSIS — N179 Acute kidney failure, unspecified: Secondary | ICD-10-CM

## 2018-01-28 LAB — URINALYSIS, ROUTINE W REFLEX MICROSCOPIC
BILIRUBIN URINE: NEGATIVE
Bacteria, UA: NONE SEEN /HPF
Glucose, UA: NEGATIVE
Ketones, ur: NEGATIVE
Leukocytes, UA: NEGATIVE
Nitrite: NEGATIVE
Protein, ur: NEGATIVE
Specific Gravity, Urine: 1.015 (ref 1.001–1.03)
Squamous Epithelial / HPF: NONE SEEN /HPF (ref <=5)
WBC, UA: NONE SEEN /HPF (ref 0–5)
pH: 7 (ref 5.0–8.0)

## 2018-01-28 LAB — MICROSCOPIC MESSAGE

## 2018-01-28 NOTE — Patient Instructions (Signed)
Recheck labs next week - Monday to wed 02/01/2018-02/03/2018

## 2018-01-28 NOTE — Progress Notes (Signed)
Patient ID: BOND GRIESHOP, male    DOB: 1945-11-29, 73 y.o.   MRN: [id number]  PCP: Alycia Rossetti, MD  Chief Complaint  Patient presents with  . Hospitalization Follow-up    Patient in today for hospital follow up     Subjective:   BERK PILOT is a 73 y.o. male, presents to clinic with CC of hospital follow up [place] from 01/13/18 Discharged from nursing home 01/24/18 - 4 days  Has PT home visit last week, he says he doesn't need any HH, because he won't be at home, he is already out driving.  He is driving, getting around his home just fine, no difficulty walking.  Dr saw pt in nursing home Friday 01/22/18, stopped edoxaban and blood in urine stopped 01/27/2018.  HR better - he is on amiodorone now, HR is regular.  He denies any CP, palpitations, near syncope, orthopnea, PND.  He says fluid coming off legs.  They still swell a little bit during day and then improve at night.  He wears his compression stockings at night and not during the day as instructed.  He now can lay back and elevate legs and breath w/o any difficulty.  He says he is urinating okay with demadex, he denies any dysuria, and no hematuria for the past 2 days.   While in hospital there was concern for stoke, but CT was negative - he endorses Left hand pain pain and swelling - cardiology told him he had arthritis or gout - he denies any weakness and he says he had no stroke, no new deficits.  He has followed up with cardiology and will see again in the next month.    He has a Engineer, production of records with him from the nursing home that include labs done 4 days ago.  Copy will be taken and records will be reviewed.   Wt Readings from Last 5 Encounters:  01/28/18 178 lb (80.7 kg)  01/15/18 213 lb (96.6 kg)  01/13/18 212 lb 11.2 oz (96.5 kg)  12/22/17 212 lb (96.2 kg)  04/23/17 197 lb (89.4 kg)      Patient Active Problem List   Diagnosis Date Noted  . Acute respiratory  failure with hypoxia and hypercarbia ([place]) 12/30/2017  . Acute renal failure superimposed on stage 3 chronic kidney disease ([place]) 12/30/2017  . Acute respiratory failure ([place]) 12/29/2017  . Stage 3 chronic kidney disease ([place])   . Hypotension   . Acute on chronic systolic HF (heart failure) ([place]) 12/28/2017  . CHF (congestive heart failure) ([place]) 02/27/2016  . Hypokalemia 02/05/2016  . CKD (chronic kidney disease), stage II 02/05/2016  . Bilateral lower extremity edema   . Acute on chronic combined systolic and diastolic CHF (congestive heart failure) ([place]) 01/06/2016  . Gastroesophageal reflux disease   . Type 2 diabetes mellitus with diabetic nephropathy, without long-term current use of insulin ([place])   . Cardiomyopathy ([place]) 12/28/2010  . Atrial fibrillation with rapid ventricular response ([place]) 12/28/2010  . Vitamin D deficiency 09/03/2010  . Hyperlipidemia 05/05/2006  . HTN (hypertension) 04/02/2006     Prior to Admission medications   Medication Sig Start Date End Date Taking? Authorizing Provider  amiodarone (PACERONE) 200 MG tablet Take 400 mg Daily starting 01/20/18   Then 200 mg Daily starting 02/20/18 01/15/18  Yes Strader, [place] M, PA-C  atorvastatin (LIPITOR) 40 MG tablet Take 1 tablet (40 mg total) by mouth daily. 12/22/17  Yes Lucio Edward,  Tamas Suen, PA-C  feeding supplement, ENSURE ENLIVE, (ENSURE ENLIVE) LIQD Take 237 mLs by mouth 2 (two) times daily between meals. 01/13/18  Yes Irene Pap N, DO  metoprolol succinate (TOPROL XL) 25 MG 24 hr tablet Take 0.5 tablets (12.5 mg total) by mouth daily. 01/15/18  Yes Strader, [place], PA-C  pantoprazole (PROTONIX) 40 MG tablet Take 1 tablet (40 mg total) by mouth daily. 01/14/18  Yes Irene Pap N, DO  potassium chloride SA (K-DUR,KLOR-CON) 20 MEQ tablet Take 2 tablets (40 mEq total) by mouth daily. Patient taking differently: Take 20 mEq by mouth 2 (two) times daily.  12/22/17  Yes Delsa Grana, PA-C  torsemide (DEMADEX) 20 MG  tablet Take 2 tablets (40 mg total) by mouth 2 (two) times daily. 12/22/17  Yes Delsa Grana, PA-C  aspirin EC 81 MG tablet Take 81 mg by mouth daily.    [provider]  edoxaban (SAVAYSA) 60 MG TABS tablet Take 60 mg by mouth daily. Patient not taking: Reported on 01/28/2018 04/23/17   Herminio Commons, MD     Allergies  Allergen Reactions  . Entresto [Sacubitril-Valsartan]     BLE Edema/ Blisters      Family History  Problem Relation Age of Onset  . Dementia Mother   . COPD Father   . Diabetes Father   . Coronary artery disease Father      Social History   Socioeconomic History  . Marital status: Single    Spouse name: Not on file  . Number of children: 2  . Years of education: Not on file  . Highest education level: Not on file  Occupational History  . Occupation: Maintenance department    Comment: [employer]  . Financial resource strain: Not on file  . Food insecurity:    Worry: Not on file    Inability: Not on file  . Transportation needs:    Medical: Not on file    Non-medical: Not on file  Tobacco Use  . Smoking status: Never Smoker  . Smokeless tobacco: Never Used  Substance and Sexual Activity  . Alcohol use: Not Currently    Alcohol/week: 20.0 standard drinks    Types: 10 Cans of beer, 10 Standard drinks or equivalent per week    Comment: quit one year ago  . Drug use: No  . Sexual activity: Yes  Lifestyle  . Physical activity:    Days per week: Not on file    Minutes per session: Not on file  . Stress: Not on file  Relationships  . Social connections:    Talks on phone: Not on file    Gets together: Not on file    Attends religious service: Not on file    Active member of club or organization: Not on file    Attends meetings of clubs or organizations: Not on file    Relationship status: Not on file  . Intimate partner violence:    Fear of current or ex partner: Not on file    Emotionally abused: Not on file     Physically abused: Not on file    Forced sexual activity: Not on file  Other Topics Concern  . Not on file  Social History Narrative  . Not on file     Review of Systems  Constitutional: Negative.   HENT: Negative.   Eyes: Negative.   Respiratory: Negative.   Cardiovascular: Negative.   Gastrointestinal: Negative.   Endocrine: Negative.   Genitourinary: Negative.  Musculoskeletal: Negative.   Skin: Negative.   Allergic/Immunologic: Negative.   Neurological: Negative.   Hematological: Negative.   Psychiatric/Behavioral: Negative.   All other systems reviewed and are negative.      Objective:    Vitals:   01/28/18 1019  BP: 110/60  Pulse: 62  Resp: 16  Temp: 98.2 F (36.8 C)  TempSrc: Oral  SpO2: 96%  Weight: 178 lb (80.7 kg)  Height: 5' 8"  (1.727 m)      Physical Exam Vitals signs and nursing note reviewed.  Constitutional:      General: He is not in acute distress.    Appearance: Normal appearance. He is well-developed. He is not ill-appearing, toxic-appearing or diaphoretic.  HENT:     Head: Normocephalic and atraumatic.     Jaw: No trismus.     Right Ear: Tympanic membrane, ear canal and external ear normal.     Left Ear: Tympanic membrane, ear canal and external ear normal.     Nose: No mucosal edema or rhinorrhea.     Right Sinus: No maxillary sinus tenderness or frontal sinus tenderness.     Left Sinus: No maxillary sinus tenderness or frontal sinus tenderness.     Mouth/Throat:     Mouth: Mucous membranes are moist.     Pharynx: Oropharynx is clear. Uvula midline. No oropharyngeal exudate, posterior oropharyngeal erythema or uvula swelling.  Eyes:     General: Lids are normal. No scleral icterus.    Conjunctiva/sclera: Conjunctivae normal.     Pupils: Pupils are equal, round, and reactive to light.  Neck:     Musculoskeletal: Normal range of motion and neck supple.     Trachea: Trachea and phonation normal. No tracheal deviation.    Cardiovascular:     Rate and Rhythm: Normal rate and regular rhythm.     Pulses: Normal pulses.          Radial pulses are 2+ on the right side and 2+ on the left side.       Posterior tibial pulses are 2+ on the right side and 2+ on the left side.     Heart sounds: Normal heart sounds. No murmur. No friction rub. No gallop.   Pulmonary:     Effort: Pulmonary effort is normal. No respiratory distress.     Breath sounds: Normal breath sounds. No wheezing, rhonchi or rales.  Abdominal:     General: Bowel sounds are normal. There is no distension.     Palpations: Abdomen is soft.     Tenderness: There is no abdominal tenderness. There is no guarding or rebound.  Musculoskeletal: Normal range of motion.     Right lower leg: Edema (1+) present.     Left lower leg: Edema (1+) present.  Skin:    General: Skin is warm and dry.     Capillary Refill: Capillary refill takes less than 2 seconds.     Coloration: Skin is not jaundiced or pale.     Findings: No rash.  Neurological:     Mental Status: He is alert and oriented to person, place, and time.     Gait: Gait normal.  Psychiatric:        Mood and Affect: Mood normal.        Speech: Speech normal.        Behavior: Behavior normal.           Assessment & Plan:      ICD-10-CM   1. Hematuria, unspecified type R31.9 Urinalysis,  Routine w reflex microscopic    CBC with Differential  2. Chronic combined systolic and diastolic congestive heart failure (HCC) I50.42 CBC with Differential    COMPLETE METABOLIC PANEL WITH GFR  3. Cardiomyopathy, unspecified type ([place]) I42.9 CBC with Differential    COMPLETE METABOLIC PANEL WITH GFR  4. Acute renal failure, unspecified acute renal failure type ([place]) N17.9 CBC with Differential    COMPLETE METABOLIC PANEL WITH GFR  5. Hospital discharge follow-up Z09     Pt was admitted with heart failure, afib with RVR, renal failure - he became hypotensive and was intubated and in ICU with  hospitalization from 12/28/17-01/13/18 and then went to nursing/rehab facility from 01/13/18 - 01/24/18.    Pt A fib with RVR resolved in hospital, he is taking meds as prescribed, no reported SE with amiodarone, metoprolol, diuretics.  He is on ASA and was on edoxaban for primary CVA prophylaxis - but had gross hematuria, so edoxaban was held per MD at nursing home, gross hematuria resolved yesterday.  Reviewing hospital discharge - gross hematuria was present at time of discharge, thought to be from foley/BHP.   UA screened here, had 2+ blood.   Continue ASA for now and continue to hold edoxaban - will need to consult his PCP to decide on continuation or d/c of oral anticoagulant. Pt today HR RRR, no CP, no SOB, no near syncope.    His weight is down significantly - still some b/l LE 1+ pitting edema, he says it improves at night.  He was encouraged to wear compression stocking during the day and elevate legs at night.  Continue to watch weights.     Last labs from records brought in 01/22/18 sCr 1.84, BUN 32.5, eGFR (for AA) 36.11, potassium 3.5 WBC 8.5 Hgb 14.1, Hct 42.3, PLT 117  Pt will come in for repeat labs early next week - since labs were just done 01/22/18  Delsa Grana, PA-C 01/28/18 10:46 AM

## 2018-02-01 ENCOUNTER — Encounter: Payer: Self-pay | Admitting: Family Medicine

## 2018-02-04 ENCOUNTER — Other Ambulatory Visit (HOSPITAL_COMMUNITY)
Admission: RE | Admit: 2018-02-04 | Discharge: 2018-02-04 | Disposition: A | Discharge status: Home / Self Care | Payer: BLUE CROSS/BLUE SHIELD | Source: Ambulatory Visit | Attending: Cardiovascular Disease | Admitting: Cardiovascular Disease

## 2018-02-04 ENCOUNTER — Encounter: Payer: Self-pay | Admitting: Cardiovascular Disease

## 2018-02-04 ENCOUNTER — Telehealth: Payer: Self-pay

## 2018-02-04 ENCOUNTER — Ambulatory Visit (INDEPENDENT_AMBULATORY_CARE_PROVIDER_SITE_OTHER): Payer: BLUE CROSS/BLUE SHIELD | Admitting: Cardiovascular Disease

## 2018-02-04 VITALS — BP 122/60 | HR 58 | Ht 68.0 in | Wt 172.0 lb

## 2018-02-04 DIAGNOSIS — I10 Essential (primary) hypertension: Secondary | ICD-10-CM | POA: Diagnosis not present

## 2018-02-04 DIAGNOSIS — I480 Paroxysmal atrial fibrillation: Secondary | ICD-10-CM | POA: Diagnosis not present

## 2018-02-04 DIAGNOSIS — I5042 Chronic combined systolic (congestive) and diastolic (congestive) heart failure: Secondary | ICD-10-CM | POA: Diagnosis not present

## 2018-02-04 DIAGNOSIS — R319 Hematuria, unspecified: Secondary | ICD-10-CM | POA: Diagnosis not present

## 2018-02-04 DIAGNOSIS — E319 Polyglandular dysfunction, unspecified: Secondary | ICD-10-CM | POA: Insufficient documentation

## 2018-02-04 DIAGNOSIS — Z7901 Long term (current) use of anticoagulants: Secondary | ICD-10-CM

## 2018-02-04 DIAGNOSIS — E782 Mixed hyperlipidemia: Secondary | ICD-10-CM | POA: Diagnosis not present

## 2018-02-04 DIAGNOSIS — N184 Chronic kidney disease, stage 4 (severe): Secondary | ICD-10-CM

## 2018-02-04 DIAGNOSIS — I428 Other cardiomyopathies: Secondary | ICD-10-CM

## 2018-02-04 LAB — CBC
HCT: 31.3 % — ABNORMAL LOW (ref 39.0–52.0)
Hemoglobin: 9.6 g/dL — ABNORMAL LOW (ref 13.0–17.0)
MCH: 25.5 pg — ABNORMAL LOW (ref 26.0–34.0)
MCHC: 30.7 g/dL (ref 30.0–36.0)
MCV: 83.2 fL (ref 80.0–100.0)
Platelets: 179 10*3/uL (ref 150–400)
RBC: 3.76 MIL/uL — ABNORMAL LOW (ref 4.22–5.81)
RDW: 17.5 % — ABNORMAL HIGH (ref 11.5–15.5)
WBC: 7.3 10*3/uL (ref 4.0–10.5)
nRBC: 0 % (ref 0.0–0.2)

## 2018-02-04 LAB — BASIC METABOLIC PANEL
Anion gap: 10 (ref 5–15)
BUN: 19 mg/dL (ref 8–23)
CHLORIDE: 100 mmol/L (ref 98–111)
CO2: 29 mmol/L (ref 22–32)
Calcium: 8.3 mg/dL — ABNORMAL LOW (ref 8.9–10.3)
Creatinine, Ser: 1.74 mg/dL — ABNORMAL HIGH (ref 0.61–1.24)
GFR calc Af Amer: 45 mL/min — ABNORMAL LOW (ref >60)
GFR calc non Af Amer: 39 mL/min — ABNORMAL LOW (ref >60)
Glucose, Bld: 90 mg/dL (ref 70–99)
Potassium: 3.6 mmol/L (ref 3.5–5.1)
Sodium: 139 mmol/L (ref 135–145)

## 2018-02-04 MED ORDER — PANTOPRAZOLE SODIUM 40 MG PO TBEC: 40.0000 mg | DELAYED_RELEASE_TABLET | Freq: Every day | ORAL | 3 refills | Status: DC

## 2018-02-04 MED ORDER — EDOXABAN TOSYLATE 60 MG PO TABS: 60.0000 mg | ORAL_TABLET | Freq: Every day | ORAL | 2 refills | Status: DC

## 2018-02-04 MED ORDER — AMIODARONE HCL 200 MG PO TABS: ORAL_TABLET | ORAL | 3 refills | Status: DC

## 2018-02-04 MED ORDER — ATORVASTATIN CALCIUM 40 MG PO TABS: 40.0000 mg | ORAL_TABLET | Freq: Every day | ORAL | 2 refills | Status: DC

## 2018-02-04 NOTE — Telephone Encounter (Signed)
-----   Message from Herminio Commons, MD sent at 02/04/2018  3:42 PM EST ----- Hgb has significantly dropped. Hold both ASA and Savaysa. Forward copy to PCP. He needs further evaluation regarding this. May need to see GI as well.

## 2018-02-04 NOTE — Telephone Encounter (Signed)
Daughter, Crystal called back.I told her to tell her dad NOT to re-start Savaysa and that we had stopped ASA.I also told her we put in referral to GU .  Copied pcp labs

## 2018-02-04 NOTE — Patient Instructions (Addendum)
Medication Instructions:   Re-start Savaysa    ( edoxaban) 60 mg daily   STOP Aspirin    Take Amiodarone 400 mg (2 pills) until 1/24  THEN on January 25 th LOWER dose to 1 pill (200 mg daily)  If you need a refill on your cardiac medications before your next appointment, please call your pharmacy.   Lab work: Get labs today: BMET, CBC If you have labs (blood work) drawn today and your tests are completely normal, you will receive your results only by: Marland Kitchen MyChart Message (if you have MyChart) OR . A paper copy in the mail If you have any lab test that is abnormal or we need to change your treatment, we will call you to review the results.  Testing/Procedures: NONE  Follow-Up: At [facility], you and your health needs are our priority.  As part of our continuing mission to provide you with exceptional heart care, we have created designated Provider Care Teams.  These Care Teams include your primary Cardiologist (physician) and Advanced Practice Providers (APPs -  Physician Assistants and Nurse Practitioners) who all work together to provide you with the care you need, when you need it. You will need a follow up appointment in 3 months.  Please call our office 2 months in advance to schedule this appointment. See one of the following Advanced Practice Providers on your designated Care Team:   [place], PA-C [facility]) . Ermalinda Barrios, PA-C ([place])  Any Other Special Instructions Will Be Listed Below (If Applicable).   We referred you to see a Urologist about the blood in your urine. [facility] will call you to set up apt.  [phone]   They come to the [place] office every week
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
## 2018-02-04 NOTE — Telephone Encounter (Signed)
Both numbers for patient ring, no voicemail, the same with daughters number, will try again

## 2018-02-04 NOTE — Addendum Note (Signed)
Addended by: Barbarann Ehlers A on: 02/04/2018 04:32 PM   Modules accepted: Orders

## 2018-02-05 ENCOUNTER — Other Ambulatory Visit: Payer: Self-pay | Admitting: *Deleted

## 2018-02-05 DIAGNOSIS — D649 Anemia, unspecified: Secondary | ICD-10-CM

## 2018-02-19 ENCOUNTER — Other Ambulatory Visit: Payer: Self-pay

## 2018-02-19 ENCOUNTER — Encounter: Payer: Self-pay | Admitting: Family Medicine

## 2018-02-19 ENCOUNTER — Ambulatory Visit (INDEPENDENT_AMBULATORY_CARE_PROVIDER_SITE_OTHER): Payer: BLUE CROSS/BLUE SHIELD | Admitting: Family Medicine

## 2018-02-19 VITALS — BP 138/82 | HR 64 | Temp 98.1°F | Resp 14 | Ht 68.0 in | Wt 172.0 lb

## 2018-02-19 DIAGNOSIS — N183 Chronic kidney disease, stage 3 unspecified: Secondary | ICD-10-CM

## 2018-02-19 DIAGNOSIS — I10 Essential (primary) hypertension: Secondary | ICD-10-CM | POA: Diagnosis not present

## 2018-02-19 DIAGNOSIS — E1121 Type 2 diabetes mellitus with diabetic nephropathy: Secondary | ICD-10-CM | POA: Diagnosis not present

## 2018-02-19 DIAGNOSIS — D649 Anemia, unspecified: Secondary | ICD-10-CM | POA: Diagnosis not present

## 2018-02-19 DIAGNOSIS — I5042 Chronic combined systolic (congestive) and diastolic (congestive) heart failure: Secondary | ICD-10-CM | POA: Diagnosis not present

## 2018-02-19 NOTE — Patient Instructions (Signed)
We will call with lab results  F/U 3 months

## 2018-02-19 NOTE — Progress Notes (Signed)
   Subjective:    Patient ID: Phillip Wilson, male    DOB: 30-Apr-1945, 73 y.o.   MRN: [id number]  Patient presents for Discussion (work release forms)   Pt here for discussion about his return to work. He was recently admitted with CHF exaceration due to non compliance with medications for the most part. He also had hemturia thought to be secondary to foley catheter and his blood thinner for A fib. This was discontinued while he was in Rehab facility due to recurrent hematuria. Restart by cardiology however Hb returned at 9.6% despiteno further hematuria. He is still off his Sayvasa and ASA. He also denies any blood in the stool. He states he mailed back stool card but we have not received. He feels good, no SOB, no chest pain, no weakness, appetite is good. He lives alone but daughter is helping.   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 He states he wants to retire.     Review Of Systems:  GEN- denies fatigue, fever, weight loss,weakness, recent illness HEENT- denies eye drainage, change in vision, nasal discharge, CVS- denies chest pain, palpitations RESP- denies SOB, cough, wheeze ABD- denies N/V, change in stools, abd pain GU- denies dysuria, hematuria, dribbling, incontinence MSK- denies joint pain, muscle aches, injury Neuro- denies headache, dizziness, syncope, seizure activity       Objective:    BP 138/82   Pulse 64   Temp 98.1 F (36.7 C) (Oral)   Resp 14   Ht 5\' 8"  (1.727 m)   Wt 172 lb (78 kg)   SpO2 98%   BMI 26.15 kg/m  GEN- NAD, alert and oriented x3 HEENT- PERRL, EOMI, non injected sclera, pink conjunctiva, MMM, oropharynx clear Neck- Supple, no JVD CVS- RRR, no murmur RESP-CTAB ABD-NABS,soft,NT,ND EXT- No edema Pulses- Radial, DP- 2+        Assessment & Plan:      Problem List Items Addressed This Visit      Unprioritized   CHF (congestive heart failure) ([place]) - Primary   Currently at dry weight,feels good overall Reviewed cardiology note In lieu of his age, health problems and job description I do not think it is in his best interest to return to work. He is currently out on short term disability. 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       HTN (hypertension)    Controlled no changes      Relevant Orders   CBC with Differential/Platelet (Completed)   Basic metabolic panel (Completed)   Stage 3 chronic kidney disease (HCC)   Type 2 diabetes mellitus with diabetic nephropathy, without long-term current use of insulin ([place])    Diet controlled       Other Visit Diagnoses    Anemia, unspecified type       Another stool card given, if positive send to GI, recheck Hb, iron stores, no active bleeding   Relevant Orders   CBC with Differential/Platelet (Completed)   Iron, TIBC and Ferritin Panel (Completed)      Note: This dictation was prepared with Dragon dictation along with smaller phrase technology. Any transcriptional errors that result from this process are unintentional.

## 2018-02-20 LAB — BASIC METABOLIC PANEL
BUN/Creatinine Ratio: 14 (calc) (ref 6–22)
BUN: 25 mg/dL (ref 7–25)
CO2: 29 mmol/L (ref 20–32)
Calcium: 9.1 mg/dL (ref 8.6–10.3)
Chloride: 102 mmol/L (ref 98–110)
Creat: 1.74 mg/dL — ABNORMAL HIGH (ref 0.70–1.18)
Glucose, Bld: 90 mg/dL (ref 65–99)
Potassium: 4.6 mmol/L (ref 3.5–5.3)
Sodium: 143 mmol/L (ref 135–146)

## 2018-02-20 LAB — CBC WITH DIFFERENTIAL/PLATELET
Absolute Monocytes: 690 cells/uL (ref 200–950)
Basophils Absolute: 59 cells/uL (ref 0–200)
Basophils Relative: 1 %
EOS PCT: 5.8 %
Eosinophils Absolute: 342 cells/uL (ref 15–500)
HCT: 32.1 % — ABNORMAL LOW (ref 38.5–50.0)
Hemoglobin: 10.6 g/dL — ABNORMAL LOW (ref 13.2–17.1)
Lymphs Abs: 891 cells/uL (ref 850–3900)
MCH: 27.2 pg (ref 27.0–33.0)
MCHC: 33 g/dL (ref 32.0–36.0)
MCV: 82.3 fL (ref 80.0–100.0)
MPV: 12.2 fL (ref 7.5–12.5)
Monocytes Relative: 11.7 %
Neutro Abs: 3918 cells/uL (ref 1500–7800)
Neutrophils Relative %: 66.4 %
Platelets: 165 10*3/uL (ref 140–400)
RBC: 3.9 10*6/uL — ABNORMAL LOW (ref 4.20–5.80)
RDW: 18.2 % — ABNORMAL HIGH (ref 11.0–15.0)
Total Lymphocyte: 15.1 %
WBC: 5.9 10*3/uL (ref 3.8–10.8)

## 2018-02-20 LAB — IRON,TIBC AND FERRITIN PANEL
%SAT: 21 % (calc) (ref 20–48)
Ferritin: 330 ng/mL (ref 24–380)
Iron: 69 ug/dL (ref 50–180)
TIBC: 322 ug/dL (ref 250–425)

## 2018-02-21 ENCOUNTER — Encounter: Payer: Self-pay | Admitting: Family Medicine

## 2018-02-21 NOTE — Assessment & Plan Note (Signed)
Currently at dry weight,feels good overall Reviewed cardiology note In lieu of his age, health problems and job description I do not think it is in his best interest to return to work. He is currently out on short term disability. 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

## 2018-02-21 NOTE — Assessment & Plan Note (Signed)
Controlled no changes 

## 2018-02-21 NOTE — Assessment & Plan Note (Signed)
Diet controlled.  

## 2018-02-24 ENCOUNTER — Encounter: Payer: Self-pay | Admitting: *Deleted

## 2018-03-01 ENCOUNTER — Ambulatory Visit: Payer: Medicare HMO | Admitting: Family Medicine
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
## 2018-03-10 ENCOUNTER — Telehealth: Payer: Self-pay | Admitting: Family Medicine

## 2018-03-10 NOTE — Telephone Encounter (Signed)
Pt dropped off forms from [employer] to be filled out placed into yellow folder.

## 2018-03-15 NOTE — Telephone Encounter (Signed)
noted 

## 2018-03-15 NOTE — Telephone Encounter (Signed)
Received Credit Disability Forms from [place] [phone]  [phone]~ telephone/ [phone]~ fax.   Reason form requested: 12/28/2017- hospitalization and subsequent disability and early retirement.   Requested Beginning Date: 12/28/2017.  Verbalized that fee may be charged and is per provider prerogative.   Forms routed to provider.

## 2018-03-23 ENCOUNTER — Telehealth: Payer: Self-pay | Admitting: *Deleted

## 2018-03-23 NOTE — Telephone Encounter (Signed)
Received fax from [place] in regards to [place] (iFOBT). Advised that card was mailed back to [facility] with date of 02/22/2018. Noted to be too old to process and requires recollection.   Call placed to patient. No answer. VM noted full. Call placed to patient daughter Crystal. Made aware that we will mail out new kit, but it will need to be dropped off at our office once completed. Verbalized understanding.

## 2018-05-25 ENCOUNTER — Ambulatory Visit: Payer: BLUE CROSS/BLUE SHIELD | Admitting: Cardiovascular Disease

## 2018-06-24 ENCOUNTER — Telehealth (INDEPENDENT_AMBULATORY_CARE_PROVIDER_SITE_OTHER): Payer: BLUE CROSS/BLUE SHIELD | Admitting: Cardiovascular Disease

## 2018-06-24 ENCOUNTER — Other Ambulatory Visit: Payer: Self-pay

## 2018-06-24 ENCOUNTER — Encounter: Payer: Self-pay | Admitting: Cardiovascular Disease

## 2018-06-24 VITALS — Ht 68.0 in | Wt 185.0 lb

## 2018-06-24 DIAGNOSIS — R319 Hematuria, unspecified: Secondary | ICD-10-CM

## 2018-06-24 DIAGNOSIS — I480 Paroxysmal atrial fibrillation: Secondary | ICD-10-CM

## 2018-06-24 DIAGNOSIS — I5042 Chronic combined systolic (congestive) and diastolic (congestive) heart failure: Secondary | ICD-10-CM

## 2018-06-24 DIAGNOSIS — E782 Mixed hyperlipidemia: Secondary | ICD-10-CM

## 2018-06-24 DIAGNOSIS — N184 Chronic kidney disease, stage 4 (severe): Secondary | ICD-10-CM

## 2018-06-24 DIAGNOSIS — D649 Anemia, unspecified: Secondary | ICD-10-CM

## 2018-06-24 DIAGNOSIS — I428 Other cardiomyopathies: Secondary | ICD-10-CM

## 2018-06-24 DIAGNOSIS — I10 Essential (primary) hypertension: Secondary | ICD-10-CM

## 2018-06-24 DIAGNOSIS — Z79899 Other long term (current) drug therapy: Secondary | ICD-10-CM
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
## 2018-06-24 NOTE — Addendum Note (Signed)
Addended by: Laurine Blazer on: 06/24/2018 04:02 PM   Modules accepted: Orders

## 2018-06-24 NOTE — Patient Instructions (Signed)
Medication Instructions:  Continue all current medications.  Labwork:  TSH,  LFT - orders enclosed.  You may do these labs at [facility] or across the street at [facility] lab.   Office will contact with results via phone or letter.    Testing/Procedures: none  Follow-Up: 3 months   Any Other Special Instructions Will Be Listed Below (If Applicable).  If you need a refill on your cardiac medications before your next appointment, please call your pharmacy.

## 2018-08-11 ENCOUNTER — Telehealth: Payer: Self-pay | Admitting: *Deleted

## 2018-08-11 NOTE — Telephone Encounter (Signed)
Received disability forms from [place].  Note was attached to form requesting MD to complete and fax back to [phone]. Note was not signed by patient or patient daughter Crystal.   Call placed to patient and patient daughter Crystal to inquire. [place].

## 2018-08-12 NOTE — Telephone Encounter (Signed)
Call placed to patient daughter Crystal.   Reports that patient requires documentation for short term disability. States that she will have patient bring section (1) of form to office that patient had to complete.

## 2018-08-18 NOTE — Telephone Encounter (Signed)
Received instructions for form, but did not have any further clarification for form.   Call placed to patient daughter Crystal. Requested patient bring entire form to office.

## 2018-10-05 ENCOUNTER — Other Ambulatory Visit: Payer: Self-pay

## 2018-10-05 ENCOUNTER — Ambulatory Visit (INDEPENDENT_AMBULATORY_CARE_PROVIDER_SITE_OTHER): Payer: BC Managed Care – PPO | Admitting: Cardiovascular Disease

## 2018-10-05 ENCOUNTER — Encounter: Payer: Self-pay | Admitting: Cardiovascular Disease

## 2018-10-05 VITALS — BP 138/80 | HR 78 | Temp 99.4°F | Ht 68.0 in | Wt 190.0 lb

## 2018-10-05 DIAGNOSIS — I428 Other cardiomyopathies: Secondary | ICD-10-CM

## 2018-10-05 DIAGNOSIS — I10 Essential (primary) hypertension: Secondary | ICD-10-CM | POA: Diagnosis not present

## 2018-10-05 DIAGNOSIS — E782 Mixed hyperlipidemia: Secondary | ICD-10-CM

## 2018-10-05 DIAGNOSIS — N184 Chronic kidney disease, stage 4 (severe): Secondary | ICD-10-CM

## 2018-10-05 DIAGNOSIS — I480 Paroxysmal atrial fibrillation: Secondary | ICD-10-CM

## 2018-10-05 DIAGNOSIS — D649 Anemia, unspecified: Secondary | ICD-10-CM | POA: Diagnosis not present

## 2018-10-05 DIAGNOSIS — R319 Hematuria, unspecified: Secondary | ICD-10-CM

## 2018-10-05 DIAGNOSIS — I5042 Chronic combined systolic (congestive) and diastolic (congestive) heart failure: Secondary | ICD-10-CM

## 2018-10-05 DIAGNOSIS — Z79899 Other long term (current) drug therapy: Secondary | ICD-10-CM

## 2018-10-05 MED ORDER — ASPIRIN EC 81 MG PO TBEC: 81.0000 mg | DELAYED_RELEASE_TABLET | Freq: Every day | ORAL | Status: DC
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
## 2018-10-05 NOTE — Patient Instructions (Addendum)
Medication Instructions:  Continue all current medications.  Labwork:  BMET, TSH, LFT, CBC - orders given today.  Please do these labs at [place] will contact with results via phone or letter.    Testing/Procedures: None  Follow-Up: Your physician wants you to follow up in: 6 months.  You will receive a reminder letter in the mail one-two months in advance.  If you don't receive a letter, please call our office to schedule the follow up appointment   Any Other Special Instructions Will Be Listed Below (If Applicable).  If you need a refill on your cardiac medications before your next appointment, please call your pharmacy.

## 2018-10-21 ENCOUNTER — Other Ambulatory Visit: Payer: Self-pay

## 2018-10-21 ENCOUNTER — Ambulatory Visit (INDEPENDENT_AMBULATORY_CARE_PROVIDER_SITE_OTHER): Payer: Medicare Other | Admitting: *Deleted

## 2018-10-21 ENCOUNTER — Telehealth: Payer: Self-pay

## 2018-10-21 ENCOUNTER — Other Ambulatory Visit (HOSPITAL_COMMUNITY)
Admission: RE | Admit: 2018-10-21 | Discharge: 2018-10-21 | Disposition: A | Discharge status: Home / Self Care | Payer: BC Managed Care – PPO | Source: Ambulatory Visit | Attending: Cardiovascular Disease | Admitting: Cardiovascular Disease

## 2018-10-21 DIAGNOSIS — Z23 Encounter for immunization: Secondary | ICD-10-CM

## 2018-10-21 DIAGNOSIS — D649 Anemia, unspecified: Secondary | ICD-10-CM | POA: Insufficient documentation

## 2018-10-21 DIAGNOSIS — I10 Essential (primary) hypertension: Secondary | ICD-10-CM | POA: Insufficient documentation

## 2018-10-21 DIAGNOSIS — Z79899 Other long term (current) drug therapy: Secondary | ICD-10-CM | POA: Diagnosis not present

## 2018-10-21 DIAGNOSIS — Z9229 Personal history of other drug therapy: Secondary | ICD-10-CM

## 2018-10-21 LAB — CBC
HCT: 40.5 % (ref 39.0–52.0)
Hemoglobin: 12.5 g/dL — ABNORMAL LOW (ref 13.0–17.0)
MCH: 26.9 pg (ref 26.0–34.0)
MCHC: 30.9 g/dL (ref 30.0–36.0)
MCV: 87.3 fL (ref 80.0–100.0)
Platelets: 123 10*3/uL — ABNORMAL LOW (ref 150–400)
RBC: 4.64 MIL/uL (ref 4.22–5.81)
RDW: 15.2 % (ref 11.5–15.5)
WBC: 3.9 10*3/uL — ABNORMAL LOW (ref 4.0–10.5)
nRBC: 0 % (ref 0.0–0.2)

## 2018-10-21 LAB — HEPATIC FUNCTION PANEL
ALT: 15 U/L (ref 0–44)
AST: 34 U/L (ref 15–41)
Albumin: 3.8 g/dL (ref 3.5–5.0)
Alkaline Phosphatase: 195 U/L — ABNORMAL HIGH (ref 38–126)
Bilirubin, Direct: 0.1 mg/dL (ref 0.0–0.2)
Indirect Bilirubin: 0.8 mg/dL (ref 0.3–0.9)
Total Bilirubin: 0.9 mg/dL (ref 0.3–1.2)
Total Protein: 7.3 g/dL (ref 6.5–8.1)

## 2018-10-21 LAB — BASIC METABOLIC PANEL
Anion gap: 8 (ref 5–15)
BUN: 20 mg/dL (ref 8–23)
CO2: 29 mmol/L (ref 22–32)
Calcium: 8.4 mg/dL — ABNORMAL LOW (ref 8.9–10.3)
Chloride: 102 mmol/L (ref 98–111)
Creatinine, Ser: 1.94 mg/dL — ABNORMAL HIGH (ref 0.61–1.24)
GFR calc Af Amer: 39 mL/min — ABNORMAL LOW (ref >60)
GFR calc non Af Amer: 34 mL/min — ABNORMAL LOW (ref >60)
Glucose, Bld: 124 mg/dL — ABNORMAL HIGH (ref 70–99)
Potassium: 4 mmol/L (ref 3.5–5.1)
Sodium: 139 mmol/L (ref 135–145)

## 2018-10-21 LAB — TSH: TSH: 6.077 u[IU]/mL — ABNORMAL HIGH (ref 0.350–4.500)

## 2018-10-21 NOTE — Telephone Encounter (Signed)
-----   Message from Herminio Commons, MD sent at 10/21/2018 11:13 AM EDT ----- TSH only mildly elevated. Repeat in 6 months.c

## 2018-10-21 NOTE — Progress Notes (Signed)
Patient seen in office for Influenza Vaccination.   Tolerated IM administration well.   Immunization history updated.  

## 2018-10-21 NOTE — Telephone Encounter (Signed)
Results given to daughter, mailed her lab slip

## 2018-11-01 ENCOUNTER — Other Ambulatory Visit: Payer: Self-pay | Admitting: *Deleted

## 2018-11-01 MED ORDER — TORSEMIDE 20 MG PO TABS: 40.0000 mg | ORAL_TABLET | Freq: Two times a day (BID) | ORAL | 2 refills | Status: DC

## 2019-01-10 ENCOUNTER — Other Ambulatory Visit: Payer: Self-pay | Admitting: Family Medicine

## 2019-02-02 ENCOUNTER — Other Ambulatory Visit: Payer: Self-pay | Admitting: Family Medicine

## 2019-02-02 ENCOUNTER — Other Ambulatory Visit: Payer: Self-pay | Admitting: Student

## 2019-02-03 NOTE — Telephone Encounter (Signed)
This is a [place] pt.  °

## 2019-02-15 ENCOUNTER — Other Ambulatory Visit: Payer: Self-pay | Admitting: Family Medicine

## 2019-03-07 ENCOUNTER — Other Ambulatory Visit: Payer: Self-pay

## 2019-03-07 ENCOUNTER — Emergency Department (HOSPITAL_COMMUNITY)
Admission: EM | Admit: 2019-03-07 | Discharge: 2019-03-07 | Disposition: A | Discharge status: Home / Self Care | Payer: Medicare HMO | Attending: Emergency Medicine | Admitting: Emergency Medicine

## 2019-03-07 ENCOUNTER — Emergency Department (HOSPITAL_COMMUNITY): Payer: Medicare HMO

## 2019-03-07 ENCOUNTER — Encounter (HOSPITAL_COMMUNITY): Payer: Self-pay | Admitting: Emergency Medicine

## 2019-03-07 DIAGNOSIS — Z79899 Other long term (current) drug therapy: Secondary | ICD-10-CM | POA: Insufficient documentation

## 2019-03-07 DIAGNOSIS — M109 Gout, unspecified: Secondary | ICD-10-CM

## 2019-03-07 DIAGNOSIS — N183 Chronic kidney disease, stage 3 unspecified: Secondary | ICD-10-CM | POA: Diagnosis not present

## 2019-03-07 DIAGNOSIS — I130 Hypertensive heart and chronic kidney disease with heart failure and stage 1 through stage 4 chronic kidney disease, or unspecified chronic kidney disease: Secondary | ICD-10-CM | POA: Diagnosis not present

## 2019-03-07 DIAGNOSIS — E876 Hypokalemia: Secondary | ICD-10-CM

## 2019-03-07 DIAGNOSIS — I5042 Chronic combined systolic (congestive) and diastolic (congestive) heart failure: Secondary | ICD-10-CM | POA: Insufficient documentation

## 2019-03-07 DIAGNOSIS — M10041 Idiopathic gout, right hand: Secondary | ICD-10-CM | POA: Diagnosis not present

## 2019-03-07 DIAGNOSIS — E1122 Type 2 diabetes mellitus with diabetic chronic kidney disease: Secondary | ICD-10-CM | POA: Diagnosis not present

## 2019-03-07 DIAGNOSIS — Z7982 Long term (current) use of aspirin: Secondary | ICD-10-CM | POA: Diagnosis not present

## 2019-03-07 DIAGNOSIS — M19031 Primary osteoarthritis, right wrist: Secondary | ICD-10-CM | POA: Diagnosis not present

## 2019-03-07 DIAGNOSIS — M129 Arthropathy, unspecified: Secondary | ICD-10-CM | POA: Diagnosis present

## 2019-03-07 DIAGNOSIS — R52 Pain, unspecified: Secondary | ICD-10-CM

## 2019-03-07 DIAGNOSIS — M19041 Primary osteoarthritis, right hand: Secondary | ICD-10-CM | POA: Diagnosis not present

## 2019-03-07 LAB — CBC WITH DIFFERENTIAL/PLATELET
Abs Immature Granulocytes: 0.02 10*3/uL (ref 0.00–0.07)
Basophils Absolute: 0 10*3/uL (ref 0.0–0.1)
Basophils Relative: 0 %
Eosinophils Absolute: 0 10*3/uL (ref 0.0–0.5)
Eosinophils Relative: 0 %
HCT: 43.3 % (ref 39.0–52.0)
Hemoglobin: 13.7 g/dL (ref 13.0–17.0)
Immature Granulocytes: 0 %
Lymphocytes Relative: 7 %
Lymphs Abs: 0.6 10*3/uL — ABNORMAL LOW (ref 0.7–4.0)
MCH: 27.2 pg (ref 26.0–34.0)
MCHC: 31.6 g/dL (ref 30.0–36.0)
MCV: 86.1 fL (ref 80.0–100.0)
Monocytes Absolute: 0.8 10*3/uL (ref 0.1–1.0)
Monocytes Relative: 10 %
Neutro Abs: 6.5 10*3/uL (ref 1.7–7.7)
Neutrophils Relative %: 83 %
Platelets: 144 10*3/uL — ABNORMAL LOW (ref 150–400)
RBC: 5.03 MIL/uL (ref 4.22–5.81)
RDW: 14.4 % (ref 11.5–15.5)
WBC: 7.9 10*3/uL (ref 4.0–10.5)
nRBC: 0 % (ref 0.0–0.2)

## 2019-03-07 LAB — MAGNESIUM: Magnesium: 1.8 mg/dL (ref 1.7–2.4)

## 2019-03-07 LAB — BASIC METABOLIC PANEL
Anion gap: 15 (ref 5–15)
BUN: 19 mg/dL (ref 8–23)
CO2: 31 mmol/L (ref 22–32)
Calcium: 8.8 mg/dL — ABNORMAL LOW (ref 8.9–10.3)
Chloride: 93 mmol/L — ABNORMAL LOW (ref 98–111)
Creatinine, Ser: 1.59 mg/dL — ABNORMAL HIGH (ref 0.61–1.24)
GFR calc Af Amer: 50 mL/min — ABNORMAL LOW (ref >60)
GFR calc non Af Amer: 43 mL/min — ABNORMAL LOW (ref >60)
Glucose, Bld: 123 mg/dL — ABNORMAL HIGH (ref 70–99)
Potassium: 2.5 mmol/L — CL (ref 3.5–5.1)
Sodium: 139 mmol/L (ref 135–145)

## 2019-03-07 LAB — POTASSIUM: Potassium: 2.9 mmol/L — ABNORMAL LOW (ref 3.5–5.1)

## 2019-03-07 LAB — SEDIMENTATION RATE: Sed Rate: 32 mm/hr — ABNORMAL HIGH (ref 0–16)

## 2019-03-07 LAB — C-REACTIVE PROTEIN: CRP: 17.7 mg/dL — ABNORMAL HIGH (ref <1.0)

## 2019-03-07 LAB — URIC ACID: Uric Acid, Serum: 9.1 mg/dL — ABNORMAL HIGH (ref 3.7–8.6)

## 2019-03-07 MED ORDER — POTASSIUM CHLORIDE 10 MEQ/100ML IV SOLN
10.0000 meq | Freq: Once | INTRAVENOUS | Status: AC
  Administered 2019-03-07: 10 meq via INTRAVENOUS
  Filled 2019-03-07: qty 100

## 2019-03-07 MED ORDER — POTASSIUM CHLORIDE CRYS ER 20 MEQ PO TBCR: 20.0000 meq | EXTENDED_RELEASE_TABLET | Freq: Two times a day (BID) | ORAL | 0 refills | Status: DC

## 2019-03-07 MED ORDER — POTASSIUM CHLORIDE CRYS ER 20 MEQ PO TBCR
20.0000 meq | EXTENDED_RELEASE_TABLET | Freq: Once | ORAL | Status: AC
  Administered 2019-03-07: 20 meq via ORAL
  Filled 2019-03-07: qty 1

## 2019-03-07 MED ORDER — COLCHICINE 0.6 MG PO TABS
1.2000 mg | ORAL_TABLET | Freq: Once | ORAL | Status: AC
  Administered 2019-03-07: 1.2 mg via ORAL
  Filled 2019-03-07: qty 2

## 2019-03-07 MED ORDER — PREDNISONE 50 MG PO TABS
60.0000 mg | ORAL_TABLET | Freq: Once | ORAL | Status: AC
  Administered 2019-03-07: 60 mg via ORAL
  Filled 2019-03-07: qty 1

## 2019-03-07 MED ORDER — PREDNISONE 10 MG PO TABS: ORAL_TABLET | ORAL | 0 refills | Status: DC

## 2019-03-07 MED ORDER — POTASSIUM CHLORIDE 10 MEQ/100ML IV SOLN: 10.0000 meq | Freq: Once | INTRAVENOUS | Status: DC

## 2019-03-07 NOTE — ED Notes (Addendum)
PA went to discuss care plan and discharge instructions, pt not found in room or ED bathroom. Pt left with IV intact.   Attempted to call Pt contact numbers on file. No answer. PA,Charge Nurse, [facility] aware.
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
## 2019-03-07 NOTE — Discharge Instructions (Addendum)
Your potassium is still fairly low but improved after receiving the IV potassium.  I have prescribed you additional potassium tablets and want you to increase your dose for the next week.  Please take 2 of your 60meq tablets in the morning and 1 in the evening for the next week.  You have also been prescribed prednisone to help resolve your gout flare.  Take your next dose of prednisone tomorrow morning.  A gentle heating pad and elevation can also help with a gout attack.  If your hand symptoms are not improving over the next several days with this medication you should get rechecked by your primary doctor.  It is important for you to keep a close watch on your blood glucose levels while on prednisone as prednisone can elevate your glucose.

## 2019-03-07 NOTE — ED Triage Notes (Signed)
Pt reports right wrist swelling/redness. Pt denies any known injury but reports hx of gout.

## 2019-03-07 NOTE — ED Notes (Signed)
Pt reports could not stay for additional IV potassium runs. PA aware and reported would talk with pt.

## 2019-03-14 ENCOUNTER — Ambulatory Visit (INDEPENDENT_AMBULATORY_CARE_PROVIDER_SITE_OTHER): Payer: Medicare HMO | Admitting: Family Medicine

## 2019-03-14 ENCOUNTER — Other Ambulatory Visit: Payer: Self-pay

## 2019-03-14 ENCOUNTER — Encounter: Payer: Self-pay | Admitting: Family Medicine

## 2019-03-14 VITALS — BP 128/78 | HR 76 | Temp 98.7°F | Resp 14 | Ht 68.0 in | Wt 207.0 lb

## 2019-03-14 DIAGNOSIS — I5042 Chronic combined systolic (congestive) and diastolic (congestive) heart failure: Secondary | ICD-10-CM | POA: Diagnosis not present

## 2019-03-14 DIAGNOSIS — E876 Hypokalemia: Secondary | ICD-10-CM

## 2019-03-14 DIAGNOSIS — R7989 Other specified abnormal findings of blood chemistry: Secondary | ICD-10-CM

## 2019-03-14 DIAGNOSIS — I509 Heart failure, unspecified: Secondary | ICD-10-CM | POA: Diagnosis not present

## 2019-03-14 DIAGNOSIS — I10 Essential (primary) hypertension: Secondary | ICD-10-CM

## 2019-03-14 DIAGNOSIS — M10041 Idiopathic gout, right hand: Secondary | ICD-10-CM | POA: Diagnosis not present

## 2019-03-14 DIAGNOSIS — I110 Hypertensive heart disease with heart failure: Secondary | ICD-10-CM | POA: Diagnosis not present

## 2019-03-14 DIAGNOSIS — K219 Gastro-esophageal reflux disease without esophagitis: Secondary | ICD-10-CM

## 2019-03-14 DIAGNOSIS — E1121 Type 2 diabetes mellitus with diabetic nephropathy: Secondary | ICD-10-CM | POA: Diagnosis not present

## 2019-03-14 DIAGNOSIS — N1832 Chronic kidney disease, stage 3b: Secondary | ICD-10-CM

## 2019-03-14 DIAGNOSIS — M109 Gout, unspecified: Secondary | ICD-10-CM | POA: Insufficient documentation

## 2019-03-14 DIAGNOSIS — N183 Chronic kidney disease, stage 3 unspecified: Secondary | ICD-10-CM | POA: Diagnosis not present

## 2019-03-14 DIAGNOSIS — E1122 Type 2 diabetes mellitus with diabetic chronic kidney disease: Secondary | ICD-10-CM | POA: Diagnosis not present

## 2019-03-14 MED ORDER — ATORVASTATIN CALCIUM 40 MG PO TABS: 40.0000 mg | ORAL_TABLET | Freq: Every day | ORAL | 2 refills | Status: DC

## 2019-03-14 MED ORDER — PANTOPRAZOLE SODIUM 40 MG PO TBEC: 40.0000 mg | DELAYED_RELEASE_TABLET | Freq: Every day | ORAL | 3 refills | Status: DC

## 2019-03-14 MED ORDER — PREDNISONE 20 MG PO TABS: 20.0000 mg | ORAL_TABLET | Freq: Every day | ORAL | 0 refills | Status: DC

## 2019-03-14 MED ORDER — POTASSIUM CHLORIDE CRYS ER 20 MEQ PO TBCR: 20.0000 meq | EXTENDED_RELEASE_TABLET | Freq: Two times a day (BID) | ORAL | 3 refills | Status: DC

## 2019-03-14 NOTE — Assessment & Plan Note (Signed)
Diet controlled.  Recheck A1c

## 2019-03-14 NOTE — Assessment & Plan Note (Signed)
Uric acid however he has not had any exacerbations in the past 2 years.  Due to his renal function we will have to use a very low dose of allopurinol if he does have recurrent flares.  At this time I am going to give him 5 more days of prednisone to get the rest of the swelling out of his hand.  He will also avoid red meat

## 2019-03-14 NOTE — Patient Instructions (Addendum)
COVID Vaccination Information As of right now, we will not be giving COVID-19 vaccines here in our office. It is too many storage and administrating regulations that our office is not equipped to provide at this time.    You can go online at [email or web address]   That website will give you information on all counties.    If not here are the numbers you can call. [place] or [place] [phone] opt.2 [facility] Department [phone]   You can also find information on [email or web address] or call the state's COVID-19 information phone number at 211.    For the Gout, I have sent another prescription for the prednisone Potassium take twice a day  We will call with lab results F/U 4 months

## 2019-03-14 NOTE — Assessment & Plan Note (Signed)
Continue pantoprazole. °

## 2019-03-14 NOTE — Assessment & Plan Note (Signed)
Recheck his renal function.

## 2019-03-14 NOTE — Progress Notes (Signed)
Subjective:    Patient ID: Phillip Wilson, male    DOB: 1946-01-13, 74 y.o.   MRN: [id number]  Patient presents for ER F/U (hand swelling/ hypokalemia)  Pt here for ER f/u I have not seen him in over a year.  He was seen in ER on 2/8, after he had been at a cookout he was eating a lot of red meat and then started having swelling of his right hand similar to what he has had in the past with gout.  States the last time he had a flare of gout was about 4 or 5 years ago His uric acid was  9.1 Sed Rate was  32 and CRP was 17.7 Given prednisne and that helped the swelling go down some but he still has swelling over his mid hand as well as the fingers  Potassium was very low at 2.5, given potassium , advised to take 60meq in AM and 41meq, BID, now back to 20 meq a day    PAF-he is followed by cardiology.  He has not had any problems with his heart rhythm he states since he had the cardioversion.  He states he is taking his medication as prescribed for his heart including his blood pressure.  CHF- he is on demadex twice a day as well as metoprolol and amiodarone.  Hyperlipidemia on lipitr 40mg    Reviewed his cardiology note.  He had abnormal TSH back in September he is due for repeat  Diet-controlled diabetes mellitus due for repeat A1c  Phillip Wilson is daughter  Review Of Systems:  GEN- denies fatigue, fever, weight loss,weakness, recent illness HEENT- denies eye drainage, change in vision, nasal discharge, CVS- denies chest pain, palpitations RESP- denies SOB, cough, wheeze ABD- denies N/V, change in stools, abd pain GU- denies dysuria, hematuria, dribbling, incontinence MSK- +oint pain, muscle aches, injury Neuro- denies headache, dizziness, syncope, seizure activity       Objective:    BP 128/78   Pulse 76   Temp 98.7 F (37.1 C) (Temporal)   Resp 14   Ht 5\' 8"  (1.727 m)   Wt 207 lb (93.9 kg)   SpO2 95%   BMI 31.47 kg/m  GEN- NAD, alert and oriented x3 HEENT-  PERRL, EOMI, non injected sclera, pink conjunctiva Neck- Supple, no thyromegaly CVS- RRR, no murmur RESP-CTAB ABD-NABS,soft,NT,ND EXT-trace ankle edema, MSK right hand swelling from the mid hand to the MIP able to make a loose fist right hand compared to left.  Mild tenderness to palpation mid hand no erythema no warmth, full range of motion at the wrist and the elbow on the right side Pulses- Radial 2+        Assessment & Plan:      Problem List Items Addressed This Visit      Unprioritized   CHF (congestive heart failure) ([place])    Currently compensated fluid wise.  Recheck his potassium ensure that he is back to baseline.  He is taking higher doses of furosemide so may need to take potassium twice a day      Relevant Medications   atorvastatin (LIPITOR) 40 MG tablet   Gastroesophageal reflux disease    Continue pantoprazole.      Relevant Medications   pantoprazole (PROTONIX) 40 MG tablet   Gout attack    Uric acid however he has not had any exacerbations in the past 2 years.  Due to his renal function we will have to use a very low dose of  allopurinol if he does have recurrent flares.  At this time I am going to give him 5 more days of prednisone to get the rest of the swelling out of his hand.  He will also avoid red meat      HTN (hypertension) - Primary    Blood pressure controlled.      Relevant Medications   atorvastatin (LIPITOR) 40 MG tablet   Other Relevant Orders   Comprehensive metabolic panel   Hypokalemia   Stage 3 chronic kidney disease    Recheck his renal function.      Type 2 diabetes mellitus with diabetic nephropathy, without long-term current use of insulin (HCC)    Diet controlled.  Recheck A1c      Relevant Medications   atorvastatin (LIPITOR) 40 MG tablet   Other Relevant Orders   Hemoglobin A1c    Other Visit Diagnoses    Abnormal TSH       Relevant Orders   TSH   T3, free   T4, free      Note: This dictation was prepared with  Dragon dictation along with smaller phrase technology. Any transcriptional errors that result from this process are unintentional.

## 2019-03-14 NOTE — Assessment & Plan Note (Signed)
Blood pressure controlled. 

## 2019-03-14 NOTE — Assessment & Plan Note (Signed)
Currently compensated fluid wise.  Recheck his potassium ensure that he is back to baseline.  He is taking higher doses of furosemide so may need to take potassium twice a day

## 2019-03-15 LAB — COMPREHENSIVE METABOLIC PANEL
AG Ratio: 1.2 (calc) (ref 1.0–2.5)
ALT: 5 U/L — ABNORMAL LOW (ref 9–46)
AST: 17 U/L (ref 10–35)
Albumin: 3.8 g/dL (ref 3.6–5.1)
Alkaline phosphatase (APISO): 141 U/L (ref 35–144)
BUN/Creatinine Ratio: 12 (calc) (ref 6–22)
BUN: 20 mg/dL (ref 7–25)
CO2: 32 mmol/L (ref 20–32)
Calcium: 8.9 mg/dL (ref 8.6–10.3)
Chloride: 101 mmol/L (ref 98–110)
Creat: 1.61 mg/dL — ABNORMAL HIGH (ref 0.70–1.18)
Globulin: 3.1 g/dL (calc) (ref 1.9–3.7)
Glucose, Bld: 114 mg/dL — ABNORMAL HIGH (ref 65–99)
Potassium: 4 mmol/L (ref 3.5–5.3)
Sodium: 142 mmol/L (ref 135–146)
Total Bilirubin: 0.5 mg/dL (ref 0.2–1.2)
Total Protein: 6.9 g/dL (ref 6.1–8.1)

## 2019-03-15 LAB — HEMOGLOBIN A1C
Hgb A1c MFr Bld: 6.3 % of total Hgb — ABNORMAL HIGH (ref <5.7)
Mean Plasma Glucose: 134 (calc)
eAG (mmol/L): 7.4 (calc)

## 2019-03-15 LAB — T4, FREE: Free T4: 1.4 ng/dL (ref 0.8–1.8)

## 2019-03-15 LAB — TSH: TSH: 4.36 mIU/L (ref 0.40–4.50)

## 2019-03-15 LAB — T3, FREE: T3, Free: 2.3 pg/mL (ref 2.3–4.2)

## 2019-05-04 ENCOUNTER — Ambulatory Visit: Payer: Medicare HMO | Admitting: Cardiovascular Disease
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
## 2019-05-10 ENCOUNTER — Ambulatory Visit (INDEPENDENT_AMBULATORY_CARE_PROVIDER_SITE_OTHER): Payer: Medicare HMO | Admitting: Family Medicine

## 2019-05-10 ENCOUNTER — Other Ambulatory Visit: Payer: Self-pay

## 2019-05-10 ENCOUNTER — Encounter: Payer: Self-pay | Admitting: Family Medicine

## 2019-05-10 VITALS — BP 120/82 | HR 100 | Ht 68.0 in | Wt 214.0 lb

## 2019-05-10 DIAGNOSIS — I5042 Chronic combined systolic (congestive) and diastolic (congestive) heart failure: Secondary | ICD-10-CM | POA: Diagnosis not present

## 2019-05-10 DIAGNOSIS — E782 Mixed hyperlipidemia: Secondary | ICD-10-CM | POA: Diagnosis not present

## 2019-05-10 DIAGNOSIS — I480 Paroxysmal atrial fibrillation: Secondary | ICD-10-CM | POA: Diagnosis not present

## 2019-05-10 DIAGNOSIS — N1831 Chronic kidney disease, stage 3a: Secondary | ICD-10-CM | POA: Diagnosis not present

## 2019-05-10 DIAGNOSIS — I130 Hypertensive heart and chronic kidney disease with heart failure and stage 1 through stage 4 chronic kidney disease, or unspecified chronic kidney disease: Secondary | ICD-10-CM | POA: Diagnosis not present

## 2019-05-10 DIAGNOSIS — N184 Chronic kidney disease, stage 4 (severe): Secondary | ICD-10-CM | POA: Diagnosis not present

## 2019-05-10 DIAGNOSIS — I10 Essential (primary) hypertension: Secondary | ICD-10-CM

## 2019-05-10 MED ORDER — APIXABAN 5 MG PO TABS: 5.0000 mg | ORAL_TABLET | Freq: Two times a day (BID) | ORAL | 3 refills | Status: DC

## 2019-05-10 NOTE — Patient Instructions (Signed)
Your physician recommends that you schedule a follow-up appointment in: [facility]   Your physician has recommended you make the following change in your medication:   STOP ASPIRIN   START ELIQUIS 5 MG TWICE DAILY   INCREASE AMIODARONE 200 MG TWICE DAILY FOR 1 WEEK THEN RESUME 200 MG DAILY   Thank you for choosing [facility]!!

## 2019-05-12 ENCOUNTER — Telehealth: Payer: Self-pay | Admitting: *Deleted

## 2019-05-12 MED ORDER — ASPIRIN EC 81 MG PO TBEC: 81.0000 mg | DELAYED_RELEASE_TABLET | Freq: Every day | ORAL | 3 refills | Status: DC

## 2019-05-12 NOTE — Telephone Encounter (Signed)
On [facility]. Dr. Raliegh Ip sent me a note stating that the patient probably should not be on Eliquis secondary to several reasons including history of anemia. So if you do not mind call him back and tell him do not start the Eliquis. Just take a baby aspirin every day instead. Thank you

## 2019-05-16 ENCOUNTER — Telehealth: Payer: Self-pay | Admitting: Family Medicine

## 2019-05-16 NOTE — Telephone Encounter (Signed)
Patient calling to get refill on gout medication if possible  [facility] 

## 2019-05-16 NOTE — Telephone Encounter (Signed)
Call placed to patient daughter.   Appointment scheduled.

## 2019-05-17 ENCOUNTER — Ambulatory Visit (INDEPENDENT_AMBULATORY_CARE_PROVIDER_SITE_OTHER): Payer: Medicare HMO | Admitting: Family Medicine

## 2019-05-17 ENCOUNTER — Encounter: Payer: Self-pay | Admitting: Family Medicine

## 2019-05-17 ENCOUNTER — Other Ambulatory Visit: Payer: Self-pay

## 2019-05-17 ENCOUNTER — Ambulatory Visit (INDEPENDENT_AMBULATORY_CARE_PROVIDER_SITE_OTHER): Payer: Medicare HMO | Admitting: *Deleted

## 2019-05-17 VITALS — BP 136/84 | HR 59 | Temp 97.8°F | Resp 16 | Ht 68.0 in | Wt 208.4 lb

## 2019-05-17 DIAGNOSIS — M10042 Idiopathic gout, left hand: Secondary | ICD-10-CM | POA: Diagnosis not present

## 2019-05-17 DIAGNOSIS — E782 Mixed hyperlipidemia: Secondary | ICD-10-CM

## 2019-05-17 DIAGNOSIS — I480 Paroxysmal atrial fibrillation: Secondary | ICD-10-CM | POA: Diagnosis not present

## 2019-05-17 MED ORDER — METOPROLOL SUCCINATE ER 25 MG PO TB24: ORAL_TABLET | ORAL | 3 refills | Status: DC

## 2019-05-17 MED ORDER — PREDNISONE 10 MG PO TABS: ORAL_TABLET | ORAL | 0 refills | Status: DC

## 2019-05-17 MED ORDER — PANTOPRAZOLE SODIUM 40 MG PO TBEC: 40.0000 mg | DELAYED_RELEASE_TABLET | Freq: Every day | ORAL | 3 refills | Status: DC

## 2019-05-17 NOTE — Patient Instructions (Addendum)
Take the prednisone as prescribed for gout  Stay off the red meat  I have refilled the other meds F/U as previous

## 2019-05-17 NOTE — Progress Notes (Signed)
   Subjective:    Patient ID: [facility], male    DOB: 06/07/1945, 74 y.o.   MRN: [id number]  Patient presents for hand swelling  (Left hand swelling )  Pt here with  Swelling of left hand for the past week He had read meat at a cookout 2 weks ago Had same thing in right hand in Feb with an elevated uric acid level of 9.1.  He did not respond to prednisone taper which she would like to try again.  He denies any other joint pain or swelling.  No shortness of breath or chest pain.  He was seen by cardiology for his atrial fibrillation and CHF recently.  No change was made in his medications.    Uric Acid 9.1 in February  Last Cr  1.61 in fEB       Review Of Systems:  GEN- denies fatigue, fever, weight loss,weakness, recent illness HEENT- denies eye drainage, change in vision, nasal discharge, CVS- denies chest pain, palpitations RESP- denies SOB, cough, wheeze ABD- denies N/V, change in stools, abd pain GU- denies dysuria, hematuria, dribbling, incontinence MSK- + joint pain, muscle aches, injury Neuro- denies headache, dizziness, syncope, seizure activity       Objective:    BP 136/84   Pulse (!) 59   Temp 97.8 F (36.6 C) (Temporal)   Resp 16   Ht 5\' 8"  (1.727 m)   Wt 208 lb 6 oz (94.5 kg)   SpO2 93%   BMI 31.68 kg/m  GEN- NAD, alert and oriented x3 HEENT- PERRL, EOMI, non injected sclera, pink conjunctiva CVS- irregular rhythem, normal rate , no murmur RESP-CTAB ABD-NABS,soft,NT,ND EXT-trace ankle edema, MSK left hand swelling from the mid hand to the MIP able to make a loose fist  Mild tenderness to palpation mid hand no erythema no warmth, full range of motion at the wrist and the elbow on the left  side Pulses- Radial 2+      Assessment & Plan:      Problem List Items Addressed This Visit      Unprioritized   Gout attack - Primary    This is his second gout attack.  I will treat with another prednisone taper.  Due to his renal function have  been on the fence along with his other cardiac issues with starting allopurinol.  We will see how he does after the second round of steroids.  He has a gout attack I will start allopurinol at 50 mg once a day  discussed foods to avoid         Note: This dictation was prepared with Dragon dictation along with smaller phrase technology. Any transcriptional errors that result from this process are unintentional.

## 2019-05-17 NOTE — Progress Notes (Signed)
Patient in office for EKG - no complaints this morning.  Lab order for Lipids given, he will go this morning to [facility] since he has not ate yet.

## 2019-05-17 NOTE — Assessment & Plan Note (Addendum)
This is his second gout attack.  I will treat with another prednisone taper.  Due to his renal function have been on the fence along with his other cardiac issues with starting allopurinol.  We will see how he does after the second round of steroids.  He has a gout attack I will start allopurinol at 50 mg once a day  discussed foods to avoid

## 2019-05-18 ENCOUNTER — Telehealth: Payer: Self-pay | Admitting: *Deleted

## 2019-05-18 MED ORDER — AMIODARONE HCL 400 MG PO TABS: 400.0000 mg | ORAL_TABLET | Freq: Two times a day (BID) | ORAL | 0 refills | Status: DC

## 2019-05-18 NOTE — Telephone Encounter (Signed)
[facility], [place]  6/38/9373 4:28 PM EDT    Daughter (Crystal) notified. EKG nurse visit scheduled for 06/09/2019. New medication sent to [facility] now.    Phillip Blazer, LPN  7/68/1157 2:62 PM EDT    Verta Ellen., NP [facility], LPN [name]. He is still in Afib with RVR. Please call him and tell him to go back to taking 400 mg Amiodarone twice a day for the next 3 weeks and follow up with and EKG after 3 weeks to see if the heart rate has gotten any better. Than

## 2019-06-01 DIAGNOSIS — E782 Mixed hyperlipidemia: Secondary | ICD-10-CM | POA: Diagnosis not present

## 2019-06-02 LAB — LIPID PANEL
Cholesterol: 155 mg/dL
HDL: 53 mg/dL
LDL Cholesterol (Calc): 77 mg/dL
Non-HDL Cholesterol (Calc): 102 mg/dL
Total CHOL/HDL Ratio: 2.9 (calc)
Triglycerides: 146 mg/dL

## 2019-06-03 ENCOUNTER — Telehealth: Payer: Self-pay | Admitting: *Deleted

## 2019-06-03 NOTE — Telephone Encounter (Signed)
-----   Message from Verta Ellen., NP sent at 06/02/2019  7:53 AM EDT ----- Please call the patient and let him know all of his cholesterol numbers looked good his bad cholesterol was 77.  Would like it closer to the 70 range but this is pretty good.

## 2019-06-08 ENCOUNTER — Encounter: Payer: Self-pay | Admitting: *Deleted

## 2019-06-08 NOTE — Telephone Encounter (Signed)
Letter mailed with results. 

## 2019-06-09 ENCOUNTER — Other Ambulatory Visit (INDEPENDENT_AMBULATORY_CARE_PROVIDER_SITE_OTHER): Payer: Medicare HMO | Admitting: *Deleted

## 2019-06-09 ENCOUNTER — Other Ambulatory Visit: Payer: Self-pay

## 2019-06-09 ENCOUNTER — Ambulatory Visit: Payer: Medicare HMO | Admitting: *Deleted

## 2019-06-09 DIAGNOSIS — I4891 Unspecified atrial fibrillation: Secondary | ICD-10-CM | POA: Diagnosis not present

## 2019-06-09 DIAGNOSIS — I480 Paroxysmal atrial fibrillation: Secondary | ICD-10-CM

## 2019-06-09 MED ORDER — METOPROLOL SUCCINATE ER 25 MG PO TB24: ORAL_TABLET | ORAL | 1 refills | Status: DC

## 2019-06-09 NOTE — Patient Instructions (Addendum)
Your physician recommends that you schedule a follow-up appointment ASAP WITH AFIB CLINIC TOMORROW 1130AM [phone]  Your physician has recommended you make the following change in your medication:   INCREASE TOPROL XL TO 37.5 MG (1 AND 1/2 TABLETS) TWICE DAILY    Thank you for choosing [facility]!!

## 2019-06-09 NOTE — Progress Notes (Signed)
Pt here for EKG per 4/21 phone note - taking amio 400 mg bid for the last few weeks - denies any complaints or symptoms at this time - EKG today shows HR at 142 - per Dr Bronson Ing pt need ASAP appt with Afib clinic/or Dr Rayann Heman and increase Toprol XL 25 mg bid - spoke with Erline Levine at [facility] and pt has appt tomorrow at 1130 am - pt given phone number to clinic and says his sister will take

## 2019-06-10 ENCOUNTER — Ambulatory Visit (HOSPITAL_COMMUNITY): Payer: Medicare HMO | Admitting: Physician Assistant

## 2019-06-10 NOTE — Progress Notes (Signed)
Per Erline Levine, RN pt was NS for afib appt - have not been able to reach daughter or pt over the phone

## 2019-06-13 NOTE — Progress Notes (Signed)
Have tried to reach pt several time unable to leave message and no answer

## 2019-06-14 ENCOUNTER — Ambulatory Visit: Payer: Medicare HMO | Admitting: Cardiovascular Disease

## 2019-06-19 DIAGNOSIS — M10072 Idiopathic gout, left ankle and foot: Secondary | ICD-10-CM | POA: Diagnosis not present

## 2019-06-19 DIAGNOSIS — I10 Essential (primary) hypertension: Secondary | ICD-10-CM | POA: Diagnosis not present

## 2019-07-12 ENCOUNTER — Other Ambulatory Visit: Payer: Self-pay

## 2019-07-12 ENCOUNTER — Ambulatory Visit (INDEPENDENT_AMBULATORY_CARE_PROVIDER_SITE_OTHER): Payer: Medicare HMO | Admitting: Family Medicine

## 2019-07-12 ENCOUNTER — Encounter: Payer: Self-pay | Admitting: Family Medicine

## 2019-07-12 VITALS — BP 106/64 | HR 82 | Temp 97.8°F | Resp 16 | Ht 68.0 in | Wt 199.0 lb

## 2019-07-12 DIAGNOSIS — E1121 Type 2 diabetes mellitus with diabetic nephropathy: Secondary | ICD-10-CM

## 2019-07-12 DIAGNOSIS — I10 Essential (primary) hypertension: Secondary | ICD-10-CM

## 2019-07-12 DIAGNOSIS — M10041 Idiopathic gout, right hand: Secondary | ICD-10-CM

## 2019-07-12 DIAGNOSIS — I5042 Chronic combined systolic (congestive) and diastolic (congestive) heart failure: Secondary | ICD-10-CM

## 2019-07-12 DIAGNOSIS — Z1159 Encounter for screening for other viral diseases: Secondary | ICD-10-CM | POA: Diagnosis not present

## 2019-07-12 DIAGNOSIS — N1832 Chronic kidney disease, stage 3b: Secondary | ICD-10-CM

## 2019-07-12 MED ORDER — ALLOPURINOL 100 MG PO TABS: ORAL_TABLET | ORAL | 1 refills | Status: DC

## 2019-07-12 MED ORDER — ATORVASTATIN CALCIUM 40 MG PO TABS: 40.0000 mg | ORAL_TABLET | Freq: Every day | ORAL | 2 refills | Status: DC

## 2019-07-12 NOTE — Progress Notes (Signed)
Subjective:    Patient ID: [facility], male    DOB: [birth year], 74 y.o.   MRN: [id number]  Patient presents for Follow-up (is fasting)       Pt here to f/u chronic medical problems         Our last visit he had out in his hand, this responded to prednisone    He was seen ER at [facility] due to gout in his left foot last month. Given steroid shot and tramadol , this helped   CHF- he has been taking demadex 40mg  BID   taking metoprolol    Hyperlipidemia- taking lipitor , he had Lipid panel by cardiology    A fib- taking eliquis BID    He is staying active, mowing grass, feels good     DM- Last A1C 6.3%, diet controlled, due for recheck     COVID-19 UTD      Review Of Systems:  GEN- denies fatigue, fever, weight loss,weakness, recent illness HEENT- denies eye drainage, change in vision, nasal discharge, CVS- denies chest pain, palpitations RESP- denies SOB, cough, wheeze ABD- denies N/V, change in stools, abd pain GU- denies dysuria, hematuria, dribbling, incontinence MSK- denies joint pain, muscle aches, injury Neuro- denies headache, dizziness, syncope, seizure activity       Objective:    BP 106/64   Pulse 82   Temp 97.8 F (36.6 C) (Temporal)   Resp 16   Ht 5\' 8"  (1.727 m)   Wt 199 lb (90.3 kg)   SpO2 94%   BMI 30.26 kg/m  GEN- NAD, alert and oriented x3 HEENT- PERRL, EOMI, non injected sclera, pink conjunctiva, MMM, oropharynx clear Neck- Supple, no thyromegaly CVS- irregular rhythm, normal rate, no murmur RESP-CTAB ABD-NABS,soft,NT,ND EXT- trace ankle edema bilat  Pulses- Radial, DP- 2+        Assessment & Plan:      Problem List Items Addressed This Visit      Unprioritized   CHF (congestive heart failure) (HCC)    Currently compensated Check renal function on diuretics Had recent lipid with cardiology at goal ,continue statin drug lipitor      Relevant Medications   apixaban (ELIQUIS) 5 MG TABS tablet   atorvastatin  (LIPITOR) 40 MG tablet   Gout attack    Start low dose allopurinol 50mg  once a day  Due to CKD      Relevant Orders   Uric Acid   HTN (hypertension) - Primary    Controlled continue to monitor Rate controlled A fib No bleeding with eliquis      Relevant Medications   apixaban (ELIQUIS) 5 MG TABS tablet   atorvastatin (LIPITOR) 40 MG tablet   Other Relevant Orders   CBC with Differential/Platelet   COMPLETE METABOLIC PANEL WITH GFR   Stage 3 chronic kidney disease   Type 2 diabetes mellitus with diabetic nephropathy, without long-term current use of insulin (HCC)    Diet controlled, but recheck A1C as he has had multiple rounds of steroids in the past few months Pt to schedule eye exam      Relevant Medications   atorvastatin (LIPITOR) 40 MG tablet   Other Relevant Orders   Hemoglobin A1c   Microalbumin / creatinine urine ratio   HM DIABETES FOOT EXAM (Completed)    Other Visit Diagnoses    Need for hepatitis C screening test       Relevant Orders   Hepatitis C antibody      Note: This  dictation was prepared with Dragon dictation along with smaller phrase technology. Any transcriptional errors that result from this process are unintentional.

## 2019-07-12 NOTE — Assessment & Plan Note (Signed)
Start low dose allopurinol 50mg  once a day  Due to CKD

## 2019-07-12 NOTE — Patient Instructions (Signed)
Schedule eye exam  Start gout pill once a day ( allopurinol) Continue all other medicines We will call with lab results F/U 4 months for wellness visit

## 2019-07-12 NOTE — Assessment & Plan Note (Signed)
Currently compensated Check renal function on diuretics Had recent lipid with cardiology at goal ,continue statin drug lipitor

## 2019-07-12 NOTE — Assessment & Plan Note (Signed)
Controlled continue to monitor Rate controlled A fib No bleeding with eliquis

## 2019-07-12 NOTE — Assessment & Plan Note (Addendum)
Diet controlled, but recheck A1C as he has had multiple rounds of steroids in the past few months Pt to schedule eye exam

## 2019-07-13 ENCOUNTER — Other Ambulatory Visit: Payer: Self-pay | Admitting: *Deleted

## 2019-07-13 DIAGNOSIS — E876 Hypokalemia: Secondary | ICD-10-CM

## 2019-07-13 LAB — COMPLETE METABOLIC PANEL WITH GFR
AG Ratio: 1.3 (calc) (ref 1.0–2.5)
ALT: 7 U/L — ABNORMAL LOW (ref 9–46)
AST: 15 U/L (ref 10–35)
Albumin: 3.6 g/dL (ref 3.6–5.1)
Alkaline phosphatase (APISO): 116 U/L (ref 35–144)
BUN/Creatinine Ratio: 16 (calc) (ref 6–22)
BUN: 23 mg/dL (ref 7–25)
CO2: 32 mmol/L (ref 20–32)
Calcium: 8.4 mg/dL — ABNORMAL LOW (ref 8.6–10.3)
Chloride: 97 mmol/L — ABNORMAL LOW (ref 98–110)
Creat: 1.48 mg/dL — ABNORMAL HIGH (ref 0.70–1.18)
GFR, Est African American: 54 mL/min/{1.73_m2} — ABNORMAL LOW (ref >=60)
GFR, Est Non African American: 47 mL/min/{1.73_m2} — ABNORMAL LOW (ref >=60)
Globulin: 2.7 g/dL (calc) (ref 1.9–3.7)
Glucose, Bld: 105 mg/dL — ABNORMAL HIGH (ref 65–99)
Potassium: 3 mmol/L — ABNORMAL LOW (ref 3.5–5.3)
Sodium: 144 mmol/L (ref 135–146)
Total Bilirubin: 0.9 mg/dL (ref 0.2–1.2)
Total Protein: 6.3 g/dL (ref 6.1–8.1)

## 2019-07-13 LAB — CBC WITH DIFFERENTIAL/PLATELET
Absolute Monocytes: 546 cells/uL (ref 200–950)
Basophils Absolute: 62 cells/uL (ref 0–200)
Basophils Relative: 0.8 %
Eosinophils Absolute: 429 cells/uL (ref 15–500)
Eosinophils Relative: 5.5 %
HCT: 41.4 % (ref 38.5–50.0)
Hemoglobin: 13.2 g/dL (ref 13.2–17.1)
Lymphs Abs: 1006 cells/uL (ref 850–3900)
MCH: 27.3 pg (ref 27.0–33.0)
MCHC: 31.9 g/dL — ABNORMAL LOW (ref 32.0–36.0)
MCV: 85.7 fL (ref 80.0–100.0)
MPV: 13.2 fL — ABNORMAL HIGH (ref 7.5–12.5)
Monocytes Relative: 7 %
Neutro Abs: 5756 cells/uL (ref 1500–7800)
Neutrophils Relative %: 73.8 %
Platelets: 168 10*3/uL (ref 140–400)
RBC: 4.83 10*6/uL (ref 4.20–5.80)
RDW: 15.8 % — ABNORMAL HIGH (ref 11.0–15.0)
Total Lymphocyte: 12.9 %
WBC: 7.8 10*3/uL (ref 3.8–10.8)

## 2019-07-13 LAB — URIC ACID: Uric Acid, Serum: 11.8 mg/dL — ABNORMAL HIGH (ref 4.0–8.0)

## 2019-07-13 LAB — HEMOGLOBIN A1C
Hgb A1c MFr Bld: 6.9 % of total Hgb — ABNORMAL HIGH (ref <5.7)
Mean Plasma Glucose: 151 (calc)
eAG (mmol/L): 8.4 (calc)

## 2019-07-13 LAB — HEPATITIS C ANTIBODY
Hepatitis C Ab: NONREACTIVE
SIGNAL TO CUT-OFF: 0.03 (ref <1.00)

## 2019-07-13 LAB — MICROALBUMIN / CREATININE URINE RATIO
Creatinine, Urine: 129 mg/dL (ref 20–320)
Microalb Creat Ratio: 27 mcg/mg creat (ref <30)
Microalb, Ur: 3.5 mg/dL

## 2019-07-13 MED ORDER — POTASSIUM CHLORIDE CRYS ER 20 MEQ PO TBCR: 40.0000 meq | EXTENDED_RELEASE_TABLET | Freq: Every day | ORAL | 0 refills | Status: DC

## 2019-08-08 DIAGNOSIS — M7989 Other specified soft tissue disorders: Secondary | ICD-10-CM | POA: Diagnosis not present

## 2019-08-08 DIAGNOSIS — M79671 Pain in right foot: Secondary | ICD-10-CM | POA: Diagnosis not present

## 2019-08-08 DIAGNOSIS — M25571 Pain in right ankle and joints of right foot: Secondary | ICD-10-CM | POA: Diagnosis not present

## 2019-08-08 DIAGNOSIS — Z5321 Procedure and treatment not carried out due to patient leaving prior to being seen by health care provider: Secondary | ICD-10-CM | POA: Diagnosis not present

## 2019-08-15 ENCOUNTER — Other Ambulatory Visit: Payer: Self-pay | Admitting: *Deleted

## 2019-08-15 MED ORDER — ALLOPURINOL 100 MG PO TABS: ORAL_TABLET | ORAL | 1 refills | Status: DC

## 2019-09-12 ENCOUNTER — Other Ambulatory Visit: Payer: Self-pay | Admitting: Family Medicine

## 2019-10-10 ENCOUNTER — Telehealth: Payer: Self-pay | Admitting: *Deleted

## 2019-10-10 NOTE — Telephone Encounter (Signed)
Received call from patient daughter, Daleen Snook [phone]~ telephone.   Reports that patient is having SOB and requires appointment.   Call placed to patient. No answer. No VM.

## 2019-10-11 NOTE — Telephone Encounter (Signed)
Call placed to patient. No answer. No VM.  

## 2019-10-12 NOTE — Telephone Encounter (Signed)
Call placed to patient. No answer. No VM.  

## 2019-10-12 NOTE — Telephone Encounter (Signed)
Multiple calls placed to patient with no answer and no return call.   Message to be closed.  

## 2019-10-17 NOTE — Telephone Encounter (Signed)
Noted, we have been trying to contact pt for > 1 week

## 2019-10-17 NOTE — Telephone Encounter (Signed)
Received return call from patient daughter Crystal.   Reports that patient is having fluid buildup in BLE. Denies weight gain. Reports that edema is in the lower area of leg, and has not gone over the knee at this time.   States that patient is having some SOB as well. Reports that while at rest, no issues noted.   Appointment scheduled.   Advised if SOB worsens or if edema worsens, go to ER.

## 2019-10-18 ENCOUNTER — Other Ambulatory Visit: Payer: Self-pay

## 2019-10-18 ENCOUNTER — Ambulatory Visit (INDEPENDENT_AMBULATORY_CARE_PROVIDER_SITE_OTHER): Payer: Medicare HMO | Admitting: Family Medicine

## 2019-10-18 ENCOUNTER — Encounter: Payer: Self-pay | Admitting: Family Medicine

## 2019-10-18 VITALS — BP 100/66 | HR 60 | Temp 98.1°F | Resp 18 | Ht 68.0 in | Wt 203.8 lb

## 2019-10-18 DIAGNOSIS — I5042 Chronic combined systolic (congestive) and diastolic (congestive) heart failure: Secondary | ICD-10-CM | POA: Diagnosis not present

## 2019-10-18 DIAGNOSIS — I4891 Unspecified atrial fibrillation: Secondary | ICD-10-CM | POA: Diagnosis not present

## 2019-10-18 DIAGNOSIS — N1832 Chronic kidney disease, stage 3b: Secondary | ICD-10-CM | POA: Diagnosis not present

## 2019-10-18 DIAGNOSIS — R600 Localized edema: Secondary | ICD-10-CM

## 2019-10-18 DIAGNOSIS — E1121 Type 2 diabetes mellitus with diabetic nephropathy: Secondary | ICD-10-CM | POA: Diagnosis not present

## 2019-10-18 LAB — BRAIN NATRIURETIC PEPTIDE: Brain Natriuretic Peptide: 3438 pg/mL — ABNORMAL HIGH (ref <100)

## 2019-10-18 MED ORDER — TORSEMIDE 20 MG PO TABS: 40.0000 mg | ORAL_TABLET | Freq: Two times a day (BID) | ORAL | 1 refills | Status: DC

## 2019-10-18 MED ORDER — METOPROLOL SUCCINATE ER 25 MG PO TB24: ORAL_TABLET | ORAL | 1 refills | Status: DC

## 2019-10-18 MED ORDER — APIXABAN 5 MG PO TABS: 5.0000 mg | ORAL_TABLET | Freq: Two times a day (BID) | ORAL | 1 refills | Status: DC

## 2019-10-18 NOTE — Progress Notes (Addendum)
Subjective:    Patient ID: [facility], male    DOB: 01/05/1946, 74 y.o.   MRN: [id number]  Patient presents for SOB (x2 weeks- with exertion) and Edema (L foot/ leg)    Pt here with SOB worsening over the past 2 weeks , states only gets SOB when he walks the dogs , if sitting he is okay    He has occ cough, no fever, no chest congestion  no chest pain  has fluid in mostly in left leg  he has been taking demadex 55m BID   Weight up 4bs since June2021   He has not felt weak or like he going to passs out  appetite is good   He does check oxygen sat at home states it is usually 96%  DM- last A1C 6.9%, no current meds  PAF- not sure if he is taking eliquis or not , he is not sure if he has metoprolol  I called pharmacy, he has not had metoprolol filled since July He only received 60 tabs of demadex in in July, but he states he has some at home He has not had eliquis since Jan   Review Of Systems:  GEN- + fatigue, fever, weight loss,weakness, recent illness HEENT- denies eye drainage, change in vision, nasal discharge, CVS- denies chest pain, palpitations RESP-+SOB, occ cough, wheeze ABD- denies N/V, change in stools, abd pain GU- denies dysuria, hematuria, dribbling, incontinence MSK- denies joint pain, muscle aches, injury Neuro- denies headache, dizziness, syncope, seizure activity       Objective:    BP 100/66   Pulse 60   Temp 98.1 F (36.7 C) (Temporal)   Resp 18   Ht _0  (1.727 m)   Wt 203 lb 12.8 oz (92.4 kg)   SpO2 94%   BMI 30.99 kg/m  GEN- NAD, alert and oriented x3 HEENT- PERRL, EOMI, non injected sclera, pink conjunctiva, MMM, oropharynx clear Neck- Supple, no thyromegaly CVS- irregular rhythema, irregular rate, HR , no murmur RESP-CTAB ABD-NABS,soft,NT,ND EXT- 2+  edema Pulses- Radial +2+ DP-not palpated  EKG A FIB RVR HR 130-160 viewed in real time      Assessment & Plan:    Stat labs reviewed.  His BNP is over 3000.  His creatinine  is 3.1 ( last Cr 1.48 in June)  alk phos 350 with elevated liver function tests.  I tried calling patient as well as his daughter multiple times no one is answering the phone.  He needs to be routed to the emergency room for treatment for his CHF with hepatorenal failure and A fib  Problem List Items Addressed This Visit      Unprioritized   Atrial fibrillation with rapid ventricular response ([place] - Primary    A fib with RVR not on meds He is only symptomatic exerting himself Seems comfortable in room, oxygen sat is okay, he is not dizzy or disoriented  HR 100-160 intermittantly, denies chest pain or palpitations Restart metoprolol but will put on 236mBID as BP borderline  Scheduling urgent visit with cardiology- they will see him Oct 4th  Check weight at home F/U in office Friday with my partner to recheck HR and make sure pt has meds  Discussed red flags when to go to ER      Relevant Medications   metoprolol succinate (TOPROL-XL) 25 MG 24 hr tablet   torsemide (DEMADEX) 20 MG tablet   apixaban (ELIQUIS) 5 MG TABS tablet   Other Relevant Orders  CBC with Differential/Platelet (Completed)   Comprehensive metabolic panel (Completed)   Brain natriuretic peptide (Completed)   EKG 12-Lead (Completed)   Bilateral lower extremity edema   Relevant Orders   Brain natriuretic peptide (Completed)   CHF (congestive heart failure) (HCC)    Refilled diuretic per pharmacy he has not had filled I am not sure what he is taking at home Resume demadex 18m BID Check fasting labs today and BNP      Relevant Medications   metoprolol succinate (TOPROL-XL) 25 MG 24 hr tablet   torsemide (DEMADEX) 20 MG tablet   apixaban (ELIQUIS) 5 MG TABS tablet   Stage 3 chronic kidney disease   Type 2 diabetes mellitus with diabetic nephropathy, without long-term current use of insulin ([place]    Currently diet controlled Will recheck A1C      Relevant Orders   Hemoglobin A1c      Note: This dictation  was prepared with Dragon dictation along with smaller phrase technology. Any transcriptional errors that result from this process are unintentional.

## 2019-10-18 NOTE — Patient Instructions (Addendum)
Take 2 of the torosemide twice a day Take metoprolol 25mg  - 1 tablet twice a day  Take eliquis blood thinner, 1 tablet twice a day  We will call with lab results Heart doctor -  Oct 4th at  10:30am [place]  F/U Friday for recheck with Dr. Dennard Schaumann Bring all your meds

## 2019-10-18 NOTE — Assessment & Plan Note (Addendum)
A fib with RVR not on meds He is only symptomatic exerting himself Seems comfortable in room, oxygen sat is okay, he is not dizzy or disoriented  HR 100-160 intermittantly, denies chest pain or palpitations Restart metoprolol but will put on 25mg  BID as BP borderline  Scheduling urgent visit with cardiology- they will see him Oct 4th  Check weight at home F/U in office Friday with my partner to recheck HR and make sure pt has meds  Discussed red flags when to go to ER

## 2019-10-18 NOTE — Assessment & Plan Note (Signed)
Refilled diuretic per pharmacy he has not had filled I am not sure what he is taking at home Resume demadex 40mg  BID Check fasting labs today and BNP

## 2019-10-18 NOTE — Assessment & Plan Note (Signed)
Currently diet controlled Will recheck A1C

## 2019-10-19 LAB — CBC WITH DIFFERENTIAL/PLATELET
Absolute Monocytes: 534 cells/uL (ref 200–950)
Basophils Absolute: 12 cells/uL (ref 0–200)
Basophils Relative: 0.2 %
Eosinophils Absolute: 29 cells/uL (ref 15–500)
Eosinophils Relative: 0.5 %
HCT: 45.7 % (ref 38.5–50.0)
Hemoglobin: 14 g/dL (ref 13.2–17.1)
Lymphs Abs: 795 cells/uL — ABNORMAL LOW (ref 850–3900)
MCH: 26.9 pg — ABNORMAL LOW (ref 27.0–33.0)
MCHC: 30.6 g/dL — ABNORMAL LOW (ref 32.0–36.0)
MCV: 87.7 fL (ref 80.0–100.0)
Monocytes Relative: 9.2 %
Neutro Abs: 4431 cells/uL (ref 1500–7800)
Neutrophils Relative %: 76.4 %
Platelets: 121 10*3/uL — ABNORMAL LOW (ref 140–400)
RBC: 5.21 10*6/uL (ref 4.20–5.80)
RDW: 16.5 % — ABNORMAL HIGH (ref 11.0–15.0)
Total Lymphocyte: 13.7 %
WBC: 5.8 10*3/uL (ref 3.8–10.8)

## 2019-10-19 LAB — COMPREHENSIVE METABOLIC PANEL
AG Ratio: 1.5 (calc) (ref 1.0–2.5)
ALT: 78 U/L — ABNORMAL HIGH (ref 9–46)
AST: 72 U/L — ABNORMAL HIGH (ref 10–35)
Albumin: 3.7 g/dL (ref 3.6–5.1)
Alkaline phosphatase (APISO): 350 U/L — ABNORMAL HIGH (ref 35–144)
BUN/Creatinine Ratio: 21 (calc) (ref 6–22)
BUN: 65 mg/dL — ABNORMAL HIGH (ref 7–25)
CO2: 27 mmol/L (ref 20–32)
Calcium: 9.8 mg/dL (ref 8.6–10.3)
Chloride: 93 mmol/L — ABNORMAL LOW (ref 98–110)
Creat: 3.1 mg/dL — ABNORMAL HIGH (ref 0.70–1.18)
Globulin: 2.4 g/dL (calc) (ref 1.9–3.7)
Glucose, Bld: 136 mg/dL — ABNORMAL HIGH (ref 65–99)
Potassium: 4.6 mmol/L (ref 3.5–5.3)
Sodium: 138 mmol/L (ref 135–146)
Total Bilirubin: 4 mg/dL — ABNORMAL HIGH (ref 0.2–1.2)
Total Protein: 6.1 g/dL (ref 6.1–8.1)

## 2019-10-19 LAB — HEMOGLOBIN A1C
Hgb A1c MFr Bld: 6.4 % of total Hgb — ABNORMAL HIGH (ref <5.7)
Mean Plasma Glucose: 137 (calc)
eAG (mmol/L): 7.6 (calc)

## 2019-10-20 ENCOUNTER — Other Ambulatory Visit: Payer: Self-pay | Admitting: Family Medicine

## 2019-10-21 ENCOUNTER — Ambulatory Visit: Payer: Medicare HMO | Admitting: Family Medicine

## 2019-10-22 DIAGNOSIS — Z79899 Other long term (current) drug therapy: Secondary | ICD-10-CM | POA: Diagnosis not present

## 2019-10-22 DIAGNOSIS — R4182 Altered mental status, unspecified: Secondary | ICD-10-CM | POA: Diagnosis not present

## 2019-10-22 DIAGNOSIS — I517 Cardiomegaly: Secondary | ICD-10-CM | POA: Diagnosis not present

## 2019-10-22 DIAGNOSIS — I5033 Acute on chronic diastolic (congestive) heart failure: Secondary | ICD-10-CM | POA: Diagnosis not present

## 2019-10-22 DIAGNOSIS — N189 Chronic kidney disease, unspecified: Secondary | ICD-10-CM | POA: Diagnosis not present

## 2019-10-22 DIAGNOSIS — I10 Essential (primary) hypertension: Secondary | ICD-10-CM | POA: Diagnosis not present

## 2019-10-22 DIAGNOSIS — I4819 Other persistent atrial fibrillation: Secondary | ICD-10-CM | POA: Diagnosis not present

## 2019-10-22 DIAGNOSIS — I509 Heart failure, unspecified: Secondary | ICD-10-CM | POA: Diagnosis not present

## 2019-10-22 DIAGNOSIS — R570 Cardiogenic shock: Secondary | ICD-10-CM | POA: Diagnosis not present

## 2019-10-22 DIAGNOSIS — R569 Unspecified convulsions: Secondary | ICD-10-CM | POA: Diagnosis not present

## 2019-10-22 DIAGNOSIS — I110 Hypertensive heart disease with heart failure: Secondary | ICD-10-CM | POA: Diagnosis not present

## 2019-10-22 DIAGNOSIS — I5089 Other heart failure: Secondary | ICD-10-CM | POA: Diagnosis not present

## 2019-10-22 DIAGNOSIS — I5043 Acute on chronic combined systolic (congestive) and diastolic (congestive) heart failure: Secondary | ICD-10-CM | POA: Diagnosis not present

## 2019-10-22 DIAGNOSIS — I5031 Acute diastolic (congestive) heart failure: Secondary | ICD-10-CM | POA: Diagnosis not present

## 2019-10-22 DIAGNOSIS — I361 Nonrheumatic tricuspid (valve) insufficiency: Secondary | ICD-10-CM | POA: Diagnosis not present

## 2019-10-22 DIAGNOSIS — R0602 Shortness of breath: Secondary | ICD-10-CM | POA: Diagnosis not present

## 2019-10-22 DIAGNOSIS — Z20822 Contact with and (suspected) exposure to covid-19: Secondary | ICD-10-CM | POA: Diagnosis not present

## 2019-10-22 DIAGNOSIS — N179 Acute kidney failure, unspecified: Secondary | ICD-10-CM | POA: Diagnosis not present

## 2019-10-22 DIAGNOSIS — E1122 Type 2 diabetes mellitus with diabetic chronic kidney disease: Secondary | ICD-10-CM | POA: Diagnosis not present

## 2019-10-22 DIAGNOSIS — R918 Other nonspecific abnormal finding of lung field: Secondary | ICD-10-CM | POA: Diagnosis not present

## 2019-10-22 DIAGNOSIS — I272 Pulmonary hypertension, unspecified: Secondary | ICD-10-CM | POA: Diagnosis not present

## 2019-10-22 DIAGNOSIS — I5023 Acute on chronic systolic (congestive) heart failure: Secondary | ICD-10-CM | POA: Diagnosis not present

## 2019-10-22 DIAGNOSIS — I340 Nonrheumatic mitral (valve) insufficiency: Secondary | ICD-10-CM | POA: Diagnosis not present

## 2019-10-22 DIAGNOSIS — I444 Left anterior fascicular block: Secondary | ICD-10-CM | POA: Diagnosis not present

## 2019-10-22 DIAGNOSIS — J90 Pleural effusion, not elsewhere classified: Secondary | ICD-10-CM | POA: Diagnosis not present

## 2019-10-22 DIAGNOSIS — R7989 Other specified abnormal findings of blood chemistry: Secondary | ICD-10-CM | POA: Diagnosis not present

## 2019-10-22 DIAGNOSIS — N183 Chronic kidney disease, stage 3 unspecified: Secondary | ICD-10-CM | POA: Diagnosis not present

## 2019-10-22 DIAGNOSIS — K219 Gastro-esophageal reflux disease without esophagitis: Secondary | ICD-10-CM | POA: Diagnosis not present

## 2019-10-22 DIAGNOSIS — I130 Hypertensive heart and chronic kidney disease with heart failure and stage 1 through stage 4 chronic kidney disease, or unspecified chronic kidney disease: Secondary | ICD-10-CM | POA: Diagnosis not present

## 2019-10-22 DIAGNOSIS — E875 Hyperkalemia: Secondary | ICD-10-CM | POA: Diagnosis not present

## 2019-10-22 DIAGNOSIS — I4891 Unspecified atrial fibrillation: Secondary | ICD-10-CM | POA: Diagnosis not present

## 2019-10-23 DIAGNOSIS — I110 Hypertensive heart disease with heart failure: Secondary | ICD-10-CM | POA: Diagnosis not present

## 2019-10-23 DIAGNOSIS — I4891 Unspecified atrial fibrillation: Secondary | ICD-10-CM | POA: Diagnosis not present

## 2019-10-23 DIAGNOSIS — K219 Gastro-esophageal reflux disease without esophagitis: Secondary | ICD-10-CM | POA: Diagnosis not present

## 2019-10-23 DIAGNOSIS — E875 Hyperkalemia: Secondary | ICD-10-CM | POA: Diagnosis not present

## 2019-10-23 DIAGNOSIS — I5033 Acute on chronic diastolic (congestive) heart failure: Secondary | ICD-10-CM | POA: Diagnosis not present

## 2019-10-24 DIAGNOSIS — R4182 Altered mental status, unspecified: Secondary | ICD-10-CM | POA: Diagnosis not present

## 2019-10-24 DIAGNOSIS — K219 Gastro-esophageal reflux disease without esophagitis: Secondary | ICD-10-CM | POA: Diagnosis not present

## 2019-10-24 DIAGNOSIS — E875 Hyperkalemia: Secondary | ICD-10-CM | POA: Diagnosis not present

## 2019-10-24 DIAGNOSIS — I4891 Unspecified atrial fibrillation: Secondary | ICD-10-CM | POA: Diagnosis not present

## 2019-10-24 DIAGNOSIS — I5031 Acute diastolic (congestive) heart failure: Secondary | ICD-10-CM | POA: Diagnosis not present

## 2019-10-25 DIAGNOSIS — I5023 Acute on chronic systolic (congestive) heart failure: Secondary | ICD-10-CM | POA: Diagnosis not present

## 2019-10-25 DIAGNOSIS — N189 Chronic kidney disease, unspecified: Secondary | ICD-10-CM | POA: Diagnosis not present

## 2019-10-25 DIAGNOSIS — K760 Fatty (change of) liver, not elsewhere classified: Secondary | ICD-10-CM | POA: Diagnosis not present

## 2019-10-25 DIAGNOSIS — R6521 Severe sepsis with septic shock: Secondary | ICD-10-CM | POA: Diagnosis not present

## 2019-10-25 DIAGNOSIS — R932 Abnormal findings on diagnostic imaging of liver and biliary tract: Secondary | ICD-10-CM | POA: Diagnosis not present

## 2019-10-25 DIAGNOSIS — R7881 Bacteremia: Secondary | ICD-10-CM | POA: Diagnosis not present

## 2019-10-25 DIAGNOSIS — Z79899 Other long term (current) drug therapy: Secondary | ICD-10-CM | POA: Diagnosis not present

## 2019-10-25 DIAGNOSIS — D696 Thrombocytopenia, unspecified: Secondary | ICD-10-CM | POA: Diagnosis not present

## 2019-10-25 DIAGNOSIS — I472 Ventricular tachycardia: Secondary | ICD-10-CM | POA: Diagnosis not present

## 2019-10-25 DIAGNOSIS — I493 Ventricular premature depolarization: Secondary | ICD-10-CM | POA: Diagnosis not present

## 2019-10-25 DIAGNOSIS — I517 Cardiomegaly: Secondary | ICD-10-CM | POA: Diagnosis not present

## 2019-10-25 DIAGNOSIS — Z452 Encounter for adjustment and management of vascular access device: Secondary | ICD-10-CM | POA: Diagnosis not present

## 2019-10-25 DIAGNOSIS — R570 Cardiogenic shock: Secondary | ICD-10-CM | POA: Diagnosis not present

## 2019-10-25 DIAGNOSIS — R918 Other nonspecific abnormal finding of lung field: Secondary | ICD-10-CM | POA: Diagnosis not present

## 2019-10-25 DIAGNOSIS — R7989 Other specified abnormal findings of blood chemistry: Secondary | ICD-10-CM | POA: Diagnosis not present

## 2019-10-25 DIAGNOSIS — I21A1 Myocardial infarction type 2: Secondary | ICD-10-CM | POA: Diagnosis not present

## 2019-10-25 DIAGNOSIS — N183 Chronic kidney disease, stage 3 unspecified: Secondary | ICD-10-CM | POA: Diagnosis not present

## 2019-10-25 DIAGNOSIS — I5043 Acute on chronic combined systolic (congestive) and diastolic (congestive) heart failure: Secondary | ICD-10-CM | POA: Diagnosis not present

## 2019-10-25 DIAGNOSIS — I10 Essential (primary) hypertension: Secondary | ICD-10-CM | POA: Diagnosis not present

## 2019-10-25 DIAGNOSIS — I509 Heart failure, unspecified: Secondary | ICD-10-CM | POA: Diagnosis not present

## 2019-10-25 DIAGNOSIS — R579 Shock, unspecified: Secondary | ICD-10-CM | POA: Diagnosis not present

## 2019-10-25 DIAGNOSIS — I110 Hypertensive heart disease with heart failure: Secondary | ICD-10-CM | POA: Diagnosis not present

## 2019-10-25 DIAGNOSIS — A4181 Sepsis due to Enterococcus: Secondary | ICD-10-CM | POA: Diagnosis not present

## 2019-10-25 DIAGNOSIS — I444 Left anterior fascicular block: Secondary | ICD-10-CM | POA: Diagnosis not present

## 2019-10-25 DIAGNOSIS — R161 Splenomegaly, not elsewhere classified: Secondary | ICD-10-CM | POA: Diagnosis not present

## 2019-10-25 DIAGNOSIS — I4819 Other persistent atrial fibrillation: Secondary | ICD-10-CM | POA: Diagnosis not present

## 2019-10-25 DIAGNOSIS — N179 Acute kidney failure, unspecified: Secondary | ICD-10-CM | POA: Diagnosis not present

## 2019-10-25 DIAGNOSIS — R0989 Other specified symptoms and signs involving the circulatory and respiratory systems: Secondary | ICD-10-CM | POA: Diagnosis not present

## 2019-10-25 DIAGNOSIS — I130 Hypertensive heart and chronic kidney disease with heart failure and stage 1 through stage 4 chronic kidney disease, or unspecified chronic kidney disease: Secondary | ICD-10-CM | POA: Diagnosis not present

## 2019-10-25 DIAGNOSIS — I4891 Unspecified atrial fibrillation: Secondary | ICD-10-CM | POA: Diagnosis not present

## 2019-10-26 DIAGNOSIS — Z8639 Personal history of other endocrine, nutritional and metabolic disease: Secondary | ICD-10-CM | POA: Insufficient documentation

## 2019-10-26 DIAGNOSIS — N189 Chronic kidney disease, unspecified: Secondary | ICD-10-CM | POA: Diagnosis not present

## 2019-10-26 DIAGNOSIS — N179 Acute kidney failure, unspecified: Secondary | ICD-10-CM | POA: Diagnosis not present

## 2019-10-26 DIAGNOSIS — I509 Heart failure, unspecified: Secondary | ICD-10-CM | POA: Diagnosis not present

## 2019-10-26 DIAGNOSIS — I5043 Acute on chronic combined systolic (congestive) and diastolic (congestive) heart failure: Secondary | ICD-10-CM | POA: Diagnosis not present

## 2019-10-26 DIAGNOSIS — I4819 Other persistent atrial fibrillation: Secondary | ICD-10-CM | POA: Diagnosis not present

## 2019-10-26 DIAGNOSIS — N320 Bladder-neck obstruction: Secondary | ICD-10-CM | POA: Insufficient documentation

## 2019-10-27 DIAGNOSIS — N179 Acute kidney failure, unspecified: Secondary | ICD-10-CM | POA: Diagnosis not present

## 2019-10-27 DIAGNOSIS — R7989 Other specified abnormal findings of blood chemistry: Secondary | ICD-10-CM | POA: Diagnosis not present

## 2019-10-27 DIAGNOSIS — I4891 Unspecified atrial fibrillation: Secondary | ICD-10-CM | POA: Diagnosis not present

## 2019-10-27 DIAGNOSIS — N189 Chronic kidney disease, unspecified: Secondary | ICD-10-CM | POA: Diagnosis not present

## 2019-10-27 DIAGNOSIS — I5023 Acute on chronic systolic (congestive) heart failure: Secondary | ICD-10-CM | POA: Diagnosis not present

## 2019-10-28 ENCOUNTER — Telehealth: Payer: Self-pay | Admitting: Internal Medicine

## 2019-10-28 DIAGNOSIS — N189 Chronic kidney disease, unspecified: Secondary | ICD-10-CM | POA: Diagnosis not present

## 2019-10-28 DIAGNOSIS — N179 Acute kidney failure, unspecified: Secondary | ICD-10-CM | POA: Diagnosis not present

## 2019-10-28 DIAGNOSIS — I5023 Acute on chronic systolic (congestive) heart failure: Secondary | ICD-10-CM | POA: Diagnosis not present

## 2019-10-28 DIAGNOSIS — D696 Thrombocytopenia, unspecified: Secondary | ICD-10-CM | POA: Diagnosis not present

## 2019-10-28 DIAGNOSIS — R7989 Other specified abnormal findings of blood chemistry: Secondary | ICD-10-CM | POA: Diagnosis not present

## 2019-10-28 DIAGNOSIS — I4891 Unspecified atrial fibrillation: Secondary | ICD-10-CM | POA: Diagnosis not present

## 2019-10-28 NOTE — Telephone Encounter (Signed)
Called patient to remind of appt with Levell July NP on Mon 10/31/19 at 10:30am.  Spoke with patient's daughter--she said to cancel appt as patient is in [facility] with possible heart trouble.

## 2019-10-29 DIAGNOSIS — I5043 Acute on chronic combined systolic (congestive) and diastolic (congestive) heart failure: Secondary | ICD-10-CM | POA: Diagnosis not present

## 2019-10-29 DIAGNOSIS — Z452 Encounter for adjustment and management of vascular access device: Secondary | ICD-10-CM | POA: Diagnosis not present

## 2019-10-29 DIAGNOSIS — K760 Fatty (change of) liver, not elsewhere classified: Secondary | ICD-10-CM | POA: Diagnosis not present

## 2019-10-29 DIAGNOSIS — R161 Splenomegaly, not elsewhere classified: Secondary | ICD-10-CM | POA: Diagnosis not present

## 2019-10-29 DIAGNOSIS — I4819 Other persistent atrial fibrillation: Secondary | ICD-10-CM | POA: Diagnosis not present

## 2019-10-29 DIAGNOSIS — R932 Abnormal findings on diagnostic imaging of liver and biliary tract: Secondary | ICD-10-CM | POA: Diagnosis not present

## 2019-10-29 DIAGNOSIS — R579 Shock, unspecified: Secondary | ICD-10-CM | POA: Diagnosis not present

## 2019-10-30 DIAGNOSIS — I5043 Acute on chronic combined systolic (congestive) and diastolic (congestive) heart failure: Secondary | ICD-10-CM | POA: Diagnosis not present

## 2019-10-30 DIAGNOSIS — I4819 Other persistent atrial fibrillation: Secondary | ICD-10-CM | POA: Diagnosis not present

## 2019-10-30 DIAGNOSIS — R570 Cardiogenic shock: Secondary | ICD-10-CM | POA: Diagnosis not present

## 2019-10-30 DIAGNOSIS — Z452 Encounter for adjustment and management of vascular access device: Secondary | ICD-10-CM | POA: Diagnosis not present

## 2019-10-31 ENCOUNTER — Ambulatory Visit: Payer: Medicare HMO | Admitting: Family Medicine

## 2019-10-31 DIAGNOSIS — R570 Cardiogenic shock: Secondary | ICD-10-CM | POA: Diagnosis not present

## 2019-10-31 DIAGNOSIS — I5043 Acute on chronic combined systolic (congestive) and diastolic (congestive) heart failure: Secondary | ICD-10-CM | POA: Diagnosis not present

## 2019-10-31 DIAGNOSIS — R918 Other nonspecific abnormal finding of lung field: Secondary | ICD-10-CM | POA: Diagnosis not present

## 2019-10-31 DIAGNOSIS — R7881 Bacteremia: Secondary | ICD-10-CM | POA: Diagnosis not present

## 2019-10-31 DIAGNOSIS — I4819 Other persistent atrial fibrillation: Secondary | ICD-10-CM | POA: Diagnosis not present

## 2019-11-01 DIAGNOSIS — R0989 Other specified symptoms and signs involving the circulatory and respiratory systems: Secondary | ICD-10-CM | POA: Diagnosis not present

## 2019-11-01 DIAGNOSIS — I5043 Acute on chronic combined systolic (congestive) and diastolic (congestive) heart failure: Secondary | ICD-10-CM | POA: Diagnosis not present

## 2019-11-01 DIAGNOSIS — R7881 Bacteremia: Secondary | ICD-10-CM | POA: Diagnosis not present

## 2019-11-01 DIAGNOSIS — Z452 Encounter for adjustment and management of vascular access device: Secondary | ICD-10-CM | POA: Diagnosis not present

## 2019-11-01 DIAGNOSIS — R570 Cardiogenic shock: Secondary | ICD-10-CM | POA: Diagnosis not present

## 2019-11-01 DIAGNOSIS — I4819 Other persistent atrial fibrillation: Secondary | ICD-10-CM | POA: Diagnosis not present

## 2019-11-01 DIAGNOSIS — I517 Cardiomegaly: Secondary | ICD-10-CM | POA: Diagnosis not present

## 2019-11-02 DIAGNOSIS — R7881 Bacteremia: Secondary | ICD-10-CM | POA: Diagnosis not present

## 2019-11-02 DIAGNOSIS — R570 Cardiogenic shock: Secondary | ICD-10-CM | POA: Diagnosis not present

## 2019-11-02 DIAGNOSIS — I4891 Unspecified atrial fibrillation: Secondary | ICD-10-CM | POA: Diagnosis not present

## 2019-11-02 DIAGNOSIS — I5043 Acute on chronic combined systolic (congestive) and diastolic (congestive) heart failure: Secondary | ICD-10-CM | POA: Diagnosis not present

## 2019-11-03 DIAGNOSIS — I5043 Acute on chronic combined systolic (congestive) and diastolic (congestive) heart failure: Secondary | ICD-10-CM | POA: Diagnosis not present

## 2019-11-03 DIAGNOSIS — I4819 Other persistent atrial fibrillation: Secondary | ICD-10-CM | POA: Diagnosis not present

## 2019-11-04 DIAGNOSIS — R570 Cardiogenic shock: Secondary | ICD-10-CM | POA: Diagnosis not present

## 2019-11-04 DIAGNOSIS — I5043 Acute on chronic combined systolic (congestive) and diastolic (congestive) heart failure: Secondary | ICD-10-CM | POA: Diagnosis not present

## 2019-11-04 DIAGNOSIS — I4819 Other persistent atrial fibrillation: Secondary | ICD-10-CM | POA: Diagnosis not present

## 2019-11-14 ENCOUNTER — Encounter: Payer: Medicare HMO | Admitting: Family Medicine
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
## 2019-12-05 ENCOUNTER — Encounter: Payer: Self-pay | Admitting: Family Medicine

## 2019-12-05 ENCOUNTER — Ambulatory Visit (INDEPENDENT_AMBULATORY_CARE_PROVIDER_SITE_OTHER): Payer: Medicare HMO | Admitting: Family Medicine

## 2019-12-05 ENCOUNTER — Telehealth: Payer: Self-pay | Admitting: *Deleted

## 2019-12-05 VITALS — BP 100/60 | HR 94 | Ht 68.0 in | Wt 181.6 lb

## 2019-12-05 DIAGNOSIS — N1831 Chronic kidney disease, stage 3a: Secondary | ICD-10-CM

## 2019-12-05 DIAGNOSIS — I4891 Unspecified atrial fibrillation: Secondary | ICD-10-CM | POA: Diagnosis not present

## 2019-12-05 MED ORDER — PACERONE 200 MG PO TABS
200.0000 mg | ORAL_TABLET | Freq: Two times a day (BID) | ORAL | 1 refills | Status: DC
Start: 2019-12-05 — End: 2020-03-02

## 2019-12-05 NOTE — Telephone Encounter (Signed)
Spoke with sister, Walker Kehr who manages medications for patient (pill box) and advised of increase in amiodarone 200 mg to twice daily for 2 weeks, then reduce to 100 mg daily. Verbalized understanding of plan

## 2019-12-05 NOTE — Patient Instructions (Addendum)
Medication Instructions:   Your physician has recommended you make the following change in your medication:   Increase amiodarone 200 mg to twice daily for 2 weeks, then reduce to daily  Continue other medications the same   Labwork:  None  Testing/Procedures:  None  Follow-Up:  Your physician recommends that you schedule a follow-up appointment in: 2 weeks with an APP in [place].   Any Other Special Instructions Will Be Listed Below (If Applicable).   You have been referred to Nephrology-added after check out  If you need a refill on your cardiac medications before your next appointment, please call your pharmacy.

## 2019-12-05 NOTE — Addendum Note (Signed)
Addended by: Merlene Laughter on: 12/05/2019 02:53 PM   Modules accepted: Orders

## 2019-12-08 ENCOUNTER — Other Ambulatory Visit: Payer: Self-pay | Admitting: Nephrology

## 2019-12-08 DIAGNOSIS — E1122 Type 2 diabetes mellitus with diabetic chronic kidney disease: Secondary | ICD-10-CM | POA: Diagnosis not present

## 2019-12-08 DIAGNOSIS — I129 Hypertensive chronic kidney disease with stage 1 through stage 4 chronic kidney disease, or unspecified chronic kidney disease: Secondary | ICD-10-CM | POA: Diagnosis not present

## 2019-12-08 DIAGNOSIS — Z5181 Encounter for therapeutic drug level monitoring: Secondary | ICD-10-CM | POA: Diagnosis not present

## 2019-12-08 DIAGNOSIS — I5022 Chronic systolic (congestive) heart failure: Secondary | ICD-10-CM | POA: Diagnosis not present

## 2019-12-08 DIAGNOSIS — N170 Acute kidney failure with tubular necrosis: Secondary | ICD-10-CM

## 2019-12-08 DIAGNOSIS — Z79899 Other long term (current) drug therapy: Secondary | ICD-10-CM | POA: Diagnosis not present

## 2019-12-08 DIAGNOSIS — N189 Chronic kidney disease, unspecified: Secondary | ICD-10-CM | POA: Diagnosis not present

## 2019-12-13 ENCOUNTER — Other Ambulatory Visit: Payer: Self-pay

## 2019-12-13 ENCOUNTER — Ambulatory Visit (HOSPITAL_COMMUNITY)
Admission: RE | Admit: 2019-12-13 | Discharge: 2019-12-13 | Disposition: A | Discharge status: Home / Self Care | Payer: Medicare HMO | Source: Ambulatory Visit | Attending: Nephrology | Admitting: Nephrology

## 2019-12-13 DIAGNOSIS — I5022 Chronic systolic (congestive) heart failure: Secondary | ICD-10-CM | POA: Diagnosis not present

## 2019-12-13 DIAGNOSIS — N170 Acute kidney failure with tubular necrosis: Secondary | ICD-10-CM | POA: Diagnosis not present

## 2019-12-13 DIAGNOSIS — Z5181 Encounter for therapeutic drug level monitoring: Secondary | ICD-10-CM | POA: Diagnosis not present

## 2019-12-13 DIAGNOSIS — E1122 Type 2 diabetes mellitus with diabetic chronic kidney disease: Secondary | ICD-10-CM | POA: Diagnosis not present

## 2019-12-13 DIAGNOSIS — N261 Atrophy of kidney (terminal): Secondary | ICD-10-CM | POA: Diagnosis not present

## 2019-12-13 DIAGNOSIS — I129 Hypertensive chronic kidney disease with stage 1 through stage 4 chronic kidney disease, or unspecified chronic kidney disease: Secondary | ICD-10-CM | POA: Diagnosis not present

## 2019-12-13 DIAGNOSIS — N189 Chronic kidney disease, unspecified: Secondary | ICD-10-CM | POA: Diagnosis not present

## 2019-12-13 DIAGNOSIS — Z79899 Other long term (current) drug therapy: Secondary | ICD-10-CM | POA: Diagnosis not present
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
## 2019-12-19 ENCOUNTER — Ambulatory Visit: Payer: Medicare HMO | Admitting: Family Medicine
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
## 2019-12-27 ENCOUNTER — Ambulatory Visit: Payer: Medicare HMO | Admitting: Family Medicine
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
## 2019-12-29 ENCOUNTER — Encounter: Payer: Self-pay | Admitting: Family Medicine

## 2019-12-29 ENCOUNTER — Ambulatory Visit (INDEPENDENT_AMBULATORY_CARE_PROVIDER_SITE_OTHER): Payer: Medicare HMO | Admitting: Family Medicine

## 2019-12-29 VITALS — BP 100/60 | HR 100 | Ht 68.0 in | Wt 183.6 lb

## 2019-12-29 DIAGNOSIS — I4891 Unspecified atrial fibrillation: Secondary | ICD-10-CM

## 2019-12-29 DIAGNOSIS — I340 Nonrheumatic mitral (valve) insufficiency: Secondary | ICD-10-CM | POA: Diagnosis not present

## 2019-12-29 DIAGNOSIS — I5042 Chronic combined systolic (congestive) and diastolic (congestive) heart failure: Secondary | ICD-10-CM

## 2019-12-29 DIAGNOSIS — I480 Paroxysmal atrial fibrillation: Secondary | ICD-10-CM | POA: Diagnosis not present

## 2019-12-29 NOTE — Patient Instructions (Addendum)
Medication Instructions:   Your physician recommends that you continue on your current medications as directed. Please refer to the Current Medication list given to you today.  Labwork:  None  Testing/Procedures:  None  Follow-Up:  Your physician recommends that you schedule a follow-up appointment in: 3 months in [place].   You have been referred to Heart Failure Clinic  You have been referred to A-Fib Clinic  Any Other Special Instructions Will Be Listed Below (If Applicable).  If you need a refill on your cardiac medications before your next appointment, please call your pharmacy.

## 2020-01-02 ENCOUNTER — Ambulatory Visit (HOSPITAL_COMMUNITY): Payer: Medicare HMO | Admitting: Nurse Practitioner

## 2020-01-06 DIAGNOSIS — E1129 Type 2 diabetes mellitus with other diabetic kidney complication: Secondary | ICD-10-CM | POA: Diagnosis not present

## 2020-01-06 DIAGNOSIS — E211 Secondary hyperparathyroidism, not elsewhere classified: Secondary | ICD-10-CM | POA: Diagnosis not present

## 2020-01-06 DIAGNOSIS — I5022 Chronic systolic (congestive) heart failure: Secondary | ICD-10-CM | POA: Diagnosis not present

## 2020-01-06 DIAGNOSIS — N170 Acute kidney failure with tubular necrosis: Secondary | ICD-10-CM | POA: Diagnosis not present

## 2020-01-06 DIAGNOSIS — N189 Chronic kidney disease, unspecified: Secondary | ICD-10-CM | POA: Diagnosis not present

## 2020-01-06 DIAGNOSIS — R809 Proteinuria, unspecified: Secondary | ICD-10-CM | POA: Diagnosis not present

## 2020-01-06 DIAGNOSIS — E1122 Type 2 diabetes mellitus with diabetic chronic kidney disease: Secondary | ICD-10-CM | POA: Diagnosis not present

## 2020-01-06 DIAGNOSIS — E876 Hypokalemia: Secondary | ICD-10-CM | POA: Diagnosis not present

## 2020-01-06 DIAGNOSIS — D508 Other iron deficiency anemias: Secondary | ICD-10-CM | POA: Diagnosis not present

## 2020-01-12 ENCOUNTER — Encounter (INDEPENDENT_AMBULATORY_CARE_PROVIDER_SITE_OTHER): Payer: Self-pay | Admitting: *Deleted

## 2020-01-12 ENCOUNTER — Ambulatory Visit (HOSPITAL_COMMUNITY): Payer: Medicare HMO | Admitting: Nurse Practitioner
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
## 2020-01-26 IMAGING — CR DG CHEST 1V PORT
1 series · 1 of 1 positions shown · non-contrast
Comparison: Chest radiograph 12/28/2017 and CT 12/29/2017

CLINICAL DATA: Respiratory failure.

EXAM:
PORTABLE CHEST 1 VIEW

[portable]
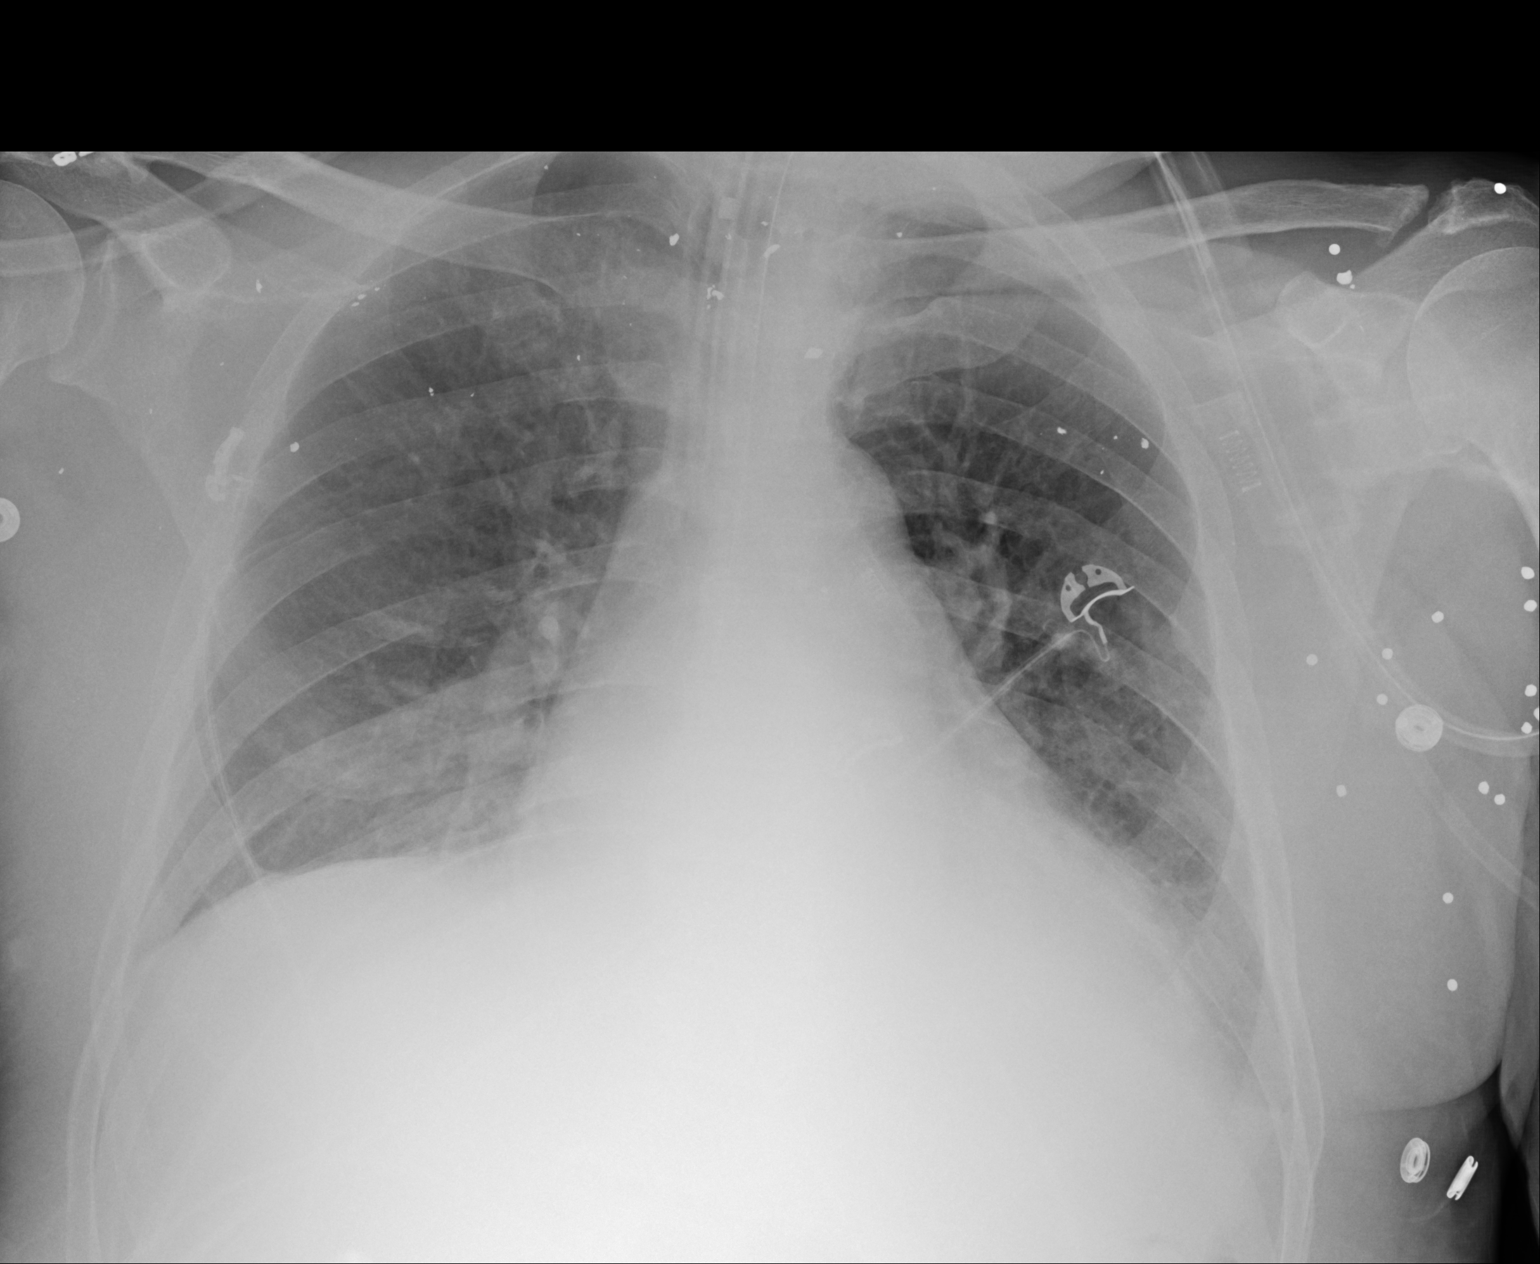

[1 of 1 positions shown; findings below may reference images not displayed]

FINDINGS: Endotracheal tube terminates 2.4 cm above the carina. Enteric tube
courses into the upper abdomen. Enlargement of the cardiac
silhouette is slightly less prominent than on the prior study.
Perihilar and bibasilar opacities have mildly improved. No large
pleural effusion or pneumothorax is identified.
IMPRESSION: Mild improvement of bilateral lung opacities suggesting improved
edema. Superimposed pneumonia is possible in the bases.

## 2020-02-01 ENCOUNTER — Encounter (HOSPITAL_COMMUNITY)
Admission: RE | Admit: 2020-02-01 | Discharge: 2020-02-01 | Disposition: A | Discharge status: Home / Self Care | Payer: Medicare HMO | Source: Ambulatory Visit | Attending: Nephrology | Admitting: Nephrology

## 2020-02-01 MED ORDER — SODIUM CHLORIDE 0.9 % IV SOLN: Freq: Once | INTRAVENOUS | Status: DC

## 2020-02-01 MED ORDER — SODIUM CHLORIDE 0.9 % IV SOLN
510.0000 mg | Freq: Once | INTRAVENOUS | Status: DC
  Filled 2020-02-01: qty 17

## 2020-02-08 ENCOUNTER — Encounter (HOSPITAL_COMMUNITY)
Admission: RE | Admit: 2020-02-08 | Discharge: 2020-02-08 | Disposition: A | Discharge status: Home / Self Care | Payer: Medicare HMO | Source: Ambulatory Visit | Attending: Nephrology | Admitting: Nephrology

## 2020-02-08 MED ORDER — SODIUM CHLORIDE 0.9 % IV SOLN
510.0000 mg | Freq: Once | INTRAVENOUS | Status: DC
  Filled 2020-02-08: qty 17

## 2020-02-08 MED ORDER — SODIUM CHLORIDE 0.9 % IV SOLN: Freq: Once | INTRAVENOUS | Status: DC

## 2020-02-17 ENCOUNTER — Encounter (HOSPITAL_COMMUNITY): Admission: RE | Admit: 2020-02-17 | Payer: Medicare HMO | Source: Ambulatory Visit

## 2020-02-24 ENCOUNTER — Encounter (HOSPITAL_COMMUNITY)
Admission: RE | Admit: 2020-02-24 | Discharge: 2020-02-24 | Disposition: A | Discharge status: Home / Self Care | Payer: Medicare HMO | Source: Ambulatory Visit | Attending: Nephrology | Admitting: Nephrology

## 2020-02-24 MED ORDER — SODIUM CHLORIDE 0.9 % IV SOLN
510.0000 mg | Freq: Once | INTRAVENOUS | Status: DC
  Filled 2020-02-24: qty 17

## 2020-02-24 MED ORDER — SODIUM CHLORIDE 0.9 % IV SOLN: Freq: Once | INTRAVENOUS | Status: DC

## 2020-03-02 ENCOUNTER — Other Ambulatory Visit: Payer: Self-pay

## 2020-03-02 MED ORDER — TAMSULOSIN HCL 0.4 MG PO CAPS: 0.4000 mg | ORAL_CAPSULE | Freq: Every day | ORAL | 1 refills | Status: AC

## 2020-03-02 MED ORDER — CALCITRIOL 0.25 MCG PO CAPS: ORAL_CAPSULE | ORAL | 1 refills | Status: AC

## 2020-03-02 MED ORDER — ATORVASTATIN CALCIUM 40 MG PO TABS: 40.0000 mg | ORAL_TABLET | Freq: Every day | ORAL | 1 refills | Status: DC

## 2020-03-02 MED ORDER — SPIRONOLACTONE 25 MG PO TABS: 25.0000 mg | ORAL_TABLET | Freq: Every day | ORAL | 1 refills | Status: DC

## 2020-03-02 MED ORDER — TORSEMIDE 20 MG PO TABS: 20.0000 mg | ORAL_TABLET | Freq: Three times a day (TID) | ORAL | 1 refills | Status: DC

## 2020-03-02 MED ORDER — APIXABAN 5 MG PO TABS: 5.0000 mg | ORAL_TABLET | Freq: Two times a day (BID) | ORAL | 1 refills | Status: AC

## 2020-03-02 MED ORDER — POTASSIUM CHLORIDE CRYS ER 20 MEQ PO TBCR
20.0000 meq | EXTENDED_RELEASE_TABLET | Freq: Once | ORAL | 1 refills | Status: DC
Start: 2020-03-02 — End: 2020-04-05

## 2020-03-02 MED ORDER — PACERONE 200 MG PO TABS: 200.0000 mg | ORAL_TABLET | Freq: Two times a day (BID) | ORAL | 1 refills | Status: DC

## 2020-03-02 MED ORDER — METOPROLOL SUCCINATE ER 25 MG PO TB24: ORAL_TABLET | ORAL | 1 refills | Status: DC

## 2020-03-02 MED ORDER — JARDIANCE 10 MG PO TABS: 10.0000 mg | ORAL_TABLET | Freq: Every day | ORAL | 1 refills | Status: DC

## 2020-03-02 NOTE — Telephone Encounter (Signed)
All medications have been filled for 6 month supply.

## 2020-03-20 DIAGNOSIS — E211 Secondary hyperparathyroidism, not elsewhere classified: Secondary | ICD-10-CM | POA: Diagnosis not present

## 2020-03-20 DIAGNOSIS — N170 Acute kidney failure with tubular necrosis: Secondary | ICD-10-CM | POA: Diagnosis not present

## 2020-03-20 DIAGNOSIS — R809 Proteinuria, unspecified: Secondary | ICD-10-CM | POA: Diagnosis not present

## 2020-03-20 DIAGNOSIS — E1129 Type 2 diabetes mellitus with other diabetic kidney complication: Secondary | ICD-10-CM | POA: Diagnosis not present

## 2020-03-20 DIAGNOSIS — I5022 Chronic systolic (congestive) heart failure: Secondary | ICD-10-CM | POA: Diagnosis not present

## 2020-03-20 DIAGNOSIS — D508 Other iron deficiency anemias: Secondary | ICD-10-CM | POA: Diagnosis not present

## 2020-03-20 DIAGNOSIS — N189 Chronic kidney disease, unspecified: Secondary | ICD-10-CM | POA: Diagnosis not present

## 2020-03-20 DIAGNOSIS — E1122 Type 2 diabetes mellitus with diabetic chronic kidney disease: Secondary | ICD-10-CM | POA: Diagnosis not present

## 2020-03-23 DIAGNOSIS — E211 Secondary hyperparathyroidism, not elsewhere classified: Secondary | ICD-10-CM | POA: Diagnosis not present

## 2020-03-23 DIAGNOSIS — I129 Hypertensive chronic kidney disease with stage 1 through stage 4 chronic kidney disease, or unspecified chronic kidney disease: Secondary | ICD-10-CM | POA: Diagnosis not present

## 2020-03-23 DIAGNOSIS — N170 Acute kidney failure with tubular necrosis: Secondary | ICD-10-CM | POA: Diagnosis not present

## 2020-03-23 DIAGNOSIS — I130 Hypertensive heart and chronic kidney disease with heart failure and stage 1 through stage 4 chronic kidney disease, or unspecified chronic kidney disease: Secondary | ICD-10-CM | POA: Diagnosis not present

## 2020-03-23 DIAGNOSIS — N189 Chronic kidney disease, unspecified: Secondary | ICD-10-CM | POA: Diagnosis not present

## 2020-03-23 DIAGNOSIS — E1122 Type 2 diabetes mellitus with diabetic chronic kidney disease: Secondary | ICD-10-CM | POA: Diagnosis not present

## 2020-03-23 DIAGNOSIS — E875 Hyperkalemia: Secondary | ICD-10-CM | POA: Diagnosis not present

## 2020-03-23 DIAGNOSIS — N184 Chronic kidney disease, stage 4 (severe): Secondary | ICD-10-CM | POA: Diagnosis not present

## 2020-03-23 DIAGNOSIS — I5022 Chronic systolic (congestive) heart failure: Secondary | ICD-10-CM | POA: Diagnosis not present

## 2020-04-05 ENCOUNTER — Encounter (INDEPENDENT_AMBULATORY_CARE_PROVIDER_SITE_OTHER): Payer: Self-pay | Admitting: Gastroenterology

## 2020-04-05 ENCOUNTER — Ambulatory Visit (INDEPENDENT_AMBULATORY_CARE_PROVIDER_SITE_OTHER): Payer: Medicare HMO | Admitting: Gastroenterology

## 2020-04-05 ENCOUNTER — Other Ambulatory Visit: Payer: Self-pay

## 2020-04-05 DIAGNOSIS — D509 Iron deficiency anemia, unspecified: Secondary | ICD-10-CM | POA: Insufficient documentation

## 2020-04-05 HISTORY — DX: Iron deficiency anemia, unspecified: D50.9

## 2020-04-05 NOTE — Patient Instructions (Signed)
Schedule EGD and colonoscopy
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
## 2020-04-06 ENCOUNTER — Encounter (INDEPENDENT_AMBULATORY_CARE_PROVIDER_SITE_OTHER): Payer: Self-pay

## 2020-04-06 ENCOUNTER — Telehealth (INDEPENDENT_AMBULATORY_CARE_PROVIDER_SITE_OTHER): Payer: Self-pay

## 2020-04-06 ENCOUNTER — Other Ambulatory Visit (INDEPENDENT_AMBULATORY_CARE_PROVIDER_SITE_OTHER): Payer: Self-pay

## 2020-04-06 ENCOUNTER — Encounter: Payer: Self-pay | Admitting: Student

## 2020-04-06 ENCOUNTER — Ambulatory Visit (INDEPENDENT_AMBULATORY_CARE_PROVIDER_SITE_OTHER): Payer: Medicare HMO | Admitting: Student

## 2020-04-06 VITALS — BP 132/74 | HR 51 | Ht 68.0 in | Wt 199.6 lb

## 2020-04-06 DIAGNOSIS — I4891 Unspecified atrial fibrillation: Secondary | ICD-10-CM

## 2020-04-06 DIAGNOSIS — Z1211 Encounter for screening for malignant neoplasm of colon: Secondary | ICD-10-CM

## 2020-04-06 DIAGNOSIS — I5042 Chronic combined systolic (congestive) and diastolic (congestive) heart failure: Secondary | ICD-10-CM | POA: Diagnosis not present

## 2020-04-06 DIAGNOSIS — I4819 Other persistent atrial fibrillation: Secondary | ICD-10-CM | POA: Diagnosis not present

## 2020-04-06 DIAGNOSIS — I4729 Other ventricular tachycardia: Secondary | ICD-10-CM

## 2020-04-06 DIAGNOSIS — N184 Chronic kidney disease, stage 4 (severe): Secondary | ICD-10-CM | POA: Diagnosis not present

## 2020-04-06 DIAGNOSIS — I10 Essential (primary) hypertension: Secondary | ICD-10-CM | POA: Diagnosis not present

## 2020-04-06 DIAGNOSIS — I340 Nonrheumatic mitral (valve) insufficiency: Secondary | ICD-10-CM

## 2020-04-06 DIAGNOSIS — I472 Ventricular tachycardia: Secondary | ICD-10-CM

## 2020-04-06 MED ORDER — HYDRALAZINE HCL 25 MG PO TABS: 12.5000 mg | ORAL_TABLET | Freq: Two times a day (BID) | ORAL | 3 refills | Status: DC

## 2020-04-06 MED ORDER — AMIODARONE HCL 200 MG PO TABS: 200.0000 mg | ORAL_TABLET | Freq: Every day | ORAL | 3 refills | Status: DC

## 2020-04-06 MED ORDER — PEG 3350-KCL-NA BICARB-NACL 420 G PO SOLR: 4000.0000 mL | ORAL | 0 refills | Status: DC

## 2020-04-06 NOTE — Patient Instructions (Addendum)
Medication Instructions:  STOP Jardiance for now (due to kidney function).   Reduce Amiodarone to 200mg  ONCE DAILY.   Start Hydralazine 12.5mg  twice daily (to help with the pumping function of your heart and does not stress the kidneys).   *If you need a refill on your cardiac medications before your next appointment, please call your pharmacy*   Lab Work: None If you have labs (blood work) drawn today and your tests are completely normal, you will receive your results only by: Marland Kitchen MyChart Message (if you have MyChart) OR . A paper copy in the mail If you have any lab test that is abnormal or we need to change your treatment, we will call you to review the results.   Testing/Procedures: None   Follow-Up: At [facility], you and your health needs are our priority.  As part of our continuing mission to provide you with exceptional heart care, we have created designated Provider Care Teams.  These Care Teams include your primary Cardiologist (physician) and Advanced Practice Providers (APPs -  Physician Assistants and Nurse Practitioners) who all work together to provide you with the care you need, when you need it.  We recommend signing up for the patient portal called "MyChart".  Sign up information is provided on this After Visit Summary.  MyChart is used to connect with patients for Virtual Visits (Telemedicine).  Patients are able to view lab/test results, encounter notes, upcoming appointments, etc.  Non-urgent messages can be sent to your provider as well.   To learn more about what you can do with MyChart, go to [email or web address].    Your next appointment:   4-6 week(s)  The format for your next appointment:   In Person  Provider:   Bernerd Pho, PA-C   Other Instructions None
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
## 2020-04-06 NOTE — Telephone Encounter (Signed)
Phillip Wilson, CMA  

## 2020-04-07 ENCOUNTER — Encounter: Payer: Self-pay | Admitting: Student

## 2020-04-10 ENCOUNTER — Encounter (INDEPENDENT_AMBULATORY_CARE_PROVIDER_SITE_OTHER): Payer: Self-pay

## 2020-04-10 ENCOUNTER — Encounter (HOSPITAL_COMMUNITY)
Admission: RE | Admit: 2020-04-10 | Discharge: 2020-04-10 | Disposition: A | Discharge status: Home / Self Care | Payer: Medicare HMO | Source: Ambulatory Visit | Attending: Nephrology | Admitting: Nephrology

## 2020-04-10 ENCOUNTER — Encounter (HOSPITAL_COMMUNITY): Payer: Self-pay

## 2020-04-10 ENCOUNTER — Other Ambulatory Visit: Payer: Self-pay

## 2020-04-10 DIAGNOSIS — D509 Iron deficiency anemia, unspecified: Secondary | ICD-10-CM | POA: Insufficient documentation

## 2020-04-10 MED ORDER — SODIUM CHLORIDE 0.9 % IV SOLN: Freq: Once | INTRAVENOUS | Status: AC

## 2020-04-10 MED ORDER — SODIUM CHLORIDE 0.9 % IV SOLN
200.0000 mg | Freq: Once | INTRAVENOUS | Status: AC
  Administered 2020-04-10: 200 mg via INTRAVENOUS
  Filled 2020-04-10: qty 10

## 2020-04-11 ENCOUNTER — Encounter (HOSPITAL_COMMUNITY): Payer: Self-pay

## 2020-04-11 ENCOUNTER — Encounter (HOSPITAL_COMMUNITY)
Admission: RE | Admit: 2020-04-11 | Discharge: 2020-04-11 | Disposition: A | Discharge status: Home / Self Care | Payer: Medicare HMO | Source: Ambulatory Visit | Attending: Nephrology | Admitting: Nephrology

## 2020-04-11 DIAGNOSIS — D509 Iron deficiency anemia, unspecified: Secondary | ICD-10-CM | POA: Diagnosis not present

## 2020-04-11 MED ORDER — SODIUM CHLORIDE 0.9 % IV SOLN
200.0000 mg | Freq: Once | INTRAVENOUS | Status: AC
  Administered 2020-04-11: 200 mg via INTRAVENOUS
  Filled 2020-04-11: qty 10

## 2020-04-11 MED ORDER — SODIUM CHLORIDE 0.9 % IV SOLN: Freq: Once | INTRAVENOUS | Status: AC

## 2020-04-12 ENCOUNTER — Encounter (HOSPITAL_COMMUNITY)
Admission: RE | Admit: 2020-04-12 | Discharge: 2020-04-12 | Disposition: A | Discharge status: Home / Self Care | Payer: Medicare HMO | Source: Ambulatory Visit | Attending: Nephrology | Admitting: Nephrology

## 2020-04-12 ENCOUNTER — Encounter (HOSPITAL_COMMUNITY): Payer: Self-pay

## 2020-04-12 ENCOUNTER — Other Ambulatory Visit: Payer: Self-pay

## 2020-04-12 DIAGNOSIS — D509 Iron deficiency anemia, unspecified: Secondary | ICD-10-CM | POA: Diagnosis not present

## 2020-04-12 MED ORDER — SODIUM CHLORIDE 0.9 % IV SOLN: Freq: Once | INTRAVENOUS | Status: AC

## 2020-04-12 MED ORDER — SODIUM CHLORIDE 0.9 % IV SOLN
200.0000 mg | Freq: Once | INTRAVENOUS | Status: AC
  Administered 2020-04-12: 200 mg via INTRAVENOUS
  Filled 2020-04-12: qty 10

## 2020-04-13 ENCOUNTER — Encounter (HOSPITAL_COMMUNITY)
Admission: RE | Admit: 2020-04-13 | Discharge: 2020-04-13 | Disposition: A | Discharge status: Home / Self Care | Payer: Medicare HMO | Source: Ambulatory Visit | Attending: Nephrology | Admitting: Nephrology

## 2020-04-13 ENCOUNTER — Encounter (HOSPITAL_COMMUNITY): Payer: Self-pay

## 2020-04-13 DIAGNOSIS — D509 Iron deficiency anemia, unspecified: Secondary | ICD-10-CM | POA: Diagnosis not present

## 2020-04-13 MED ORDER — SODIUM CHLORIDE 0.9 % IV SOLN
200.0000 mg | Freq: Once | INTRAVENOUS | Status: AC
  Administered 2020-04-13: 200 mg via INTRAVENOUS
  Filled 2020-04-13: qty 10

## 2020-04-13 MED ORDER — SODIUM CHLORIDE 0.9 % IV SOLN: Freq: Once | INTRAVENOUS | Status: AC

## 2020-04-16 ENCOUNTER — Encounter (HOSPITAL_COMMUNITY): Payer: Self-pay

## 2020-04-16 ENCOUNTER — Other Ambulatory Visit: Payer: Self-pay

## 2020-04-16 ENCOUNTER — Encounter (HOSPITAL_COMMUNITY)
Admission: RE | Admit: 2020-04-16 | Discharge: 2020-04-16 | Disposition: A | Discharge status: Home / Self Care | Payer: Medicare HMO | Source: Ambulatory Visit | Attending: Nephrology | Admitting: Nephrology

## 2020-04-16 DIAGNOSIS — D509 Iron deficiency anemia, unspecified: Secondary | ICD-10-CM | POA: Diagnosis not present

## 2020-04-16 MED ORDER — SODIUM CHLORIDE 0.9 % IV SOLN: Freq: Once | INTRAVENOUS | Status: AC

## 2020-04-16 MED ORDER — SODIUM CHLORIDE 0.9 % IV SOLN
200.0000 mg | Freq: Once | INTRAVENOUS | Status: AC
  Administered 2020-04-16: 200 mg via INTRAVENOUS
  Filled 2020-04-16: qty 10

## 2020-04-17 NOTE — Patient Instructions (Signed)
Phillip Wilson  04/17/2020     @PREFPERIOPPHARMACY @   Your procedure is scheduled on  04/20/2020   Report to [facility] at  [place]   Call this number if you have problems the morning of surgery:  [phone]   Remember:  Follow the diet and prep instructions given to you by the office.                      Take these medicines the morning of surgery with A SIP OF WATER  Allopurinol, amiodarone, metoprolol, flomax.  Your last dose of eliquis should be 04/16/2020.    Please brush your teeth.  Do not wear jewelry, make-up or nail polish.  Do not wear lotions, powders, or perfumes, or deodorant.  Do not shave 48 hours prior to surgery.  Men may shave face and neck.  Do not bring valuables to the hospital.  [facility] is not responsible for any belongings or valuables.  Contacts, dentures or bridgework may not be worn into surgery.  Leave your suitcase in the car.  After surgery it may be brought to your room.  For patients admitted to the hospital, discharge time will be determined by your treatment team.  Patients discharged the day of surgery will not be allowed to drive home and they must have someone with them for 24 hours.   Special instructions:  DO NOT smoke tobacco or vape the morning of your procedure.  Please read over the following fact sheets that you were given. Anesthesia Post-op Instructions and Care and Recovery After Surgery       Upper Endoscopy, Adult, Care After This sheet gives you information about how to care for yourself after your procedure. Your health care provider may also give you more specific instructions. If you have problems or questions, contact your health care provider. What can I expect after the procedure? After the procedure, it is common to have:  A sore throat.  Mild stomach pain or discomfort.  Bloating.  Nausea. Follow these instructions at home:  Follow instructions from your health care provider  about what to eat or drink after your procedure.  Return to your normal activities as told by your health care provider. Ask your health care provider what activities are safe for you.  Take over-the-counter and prescription medicines only as told by your health care provider.  If you were given a sedative during the procedure, it can affect you for several hours. Do not drive or operate machinery until your health care provider says that it is safe.  Keep all follow-up visits as told by your health care provider. This is important.   Contact a health care provider if you have:  A sore throat that lasts longer than one day.  Trouble swallowing. Get help right away if:  You vomit blood or your vomit looks like coffee grounds.  You have: ? A fever. ? Bloody, black, or tarry stools. ? A severe sore throat or you cannot swallow. ? Difficulty breathing. ? Severe pain in your chest or abdomen. Summary  After the procedure, it is common to have a sore throat, mild stomach discomfort, bloating, and nausea.  If you were given a sedative during the procedure, it can affect you for several hours. Do not drive or operate machinery until your health care provider says that it is safe.  Follow instructions from your health care provider about what  to eat or drink after your procedure.  Return to your normal activities as told by your health care provider. This information is not intended to replace advice given to you by your health care provider. Make sure you discuss any questions you have with your health care provider. Document Revised: 01/11/2019 Document Reviewed: 06/15/2017 Elsevier Patient Education  2021 [place].  Colonoscopy, Adult, Care After This sheet gives you information about how to care for yourself after your procedure. Your health care provider may also give you more specific instructions. If you have problems or questions, contact your health care provider. What can  I expect after the procedure? After the procedure, it is common to have:  A small amount of blood in your stool for 24 hours after the procedure.  Some gas.  Mild cramping or bloating of your abdomen. Follow these instructions at home: Eating and drinking  Drink enough fluid to keep your urine pale yellow.  Follow instructions from your health care provider about eating or drinking restrictions.  Resume your normal diet as instructed by your health care provider. Avoid heavy or fried foods that are hard to digest.   Activity  Rest as told by your health care provider.  Avoid sitting for a long time without moving. Get up to take short walks every 1-2 hours. This is important to improve blood flow and breathing. Ask for help if you feel weak or unsteady.  Return to your normal activities as told by your health care provider. Ask your health care provider what activities are safe for you. Managing cramping and bloating  Try walking around when you have cramps or feel bloated.  Apply heat to your abdomen as told by your health care provider. Use the heat source that your health care provider recommends, such as a moist heat pack or a heating pad. ? Place a towel between your skin and the heat source. ? Leave the heat on for 20-30 minutes. ? Remove the heat if your skin turns bright red. This is especially important if you are unable to feel pain, heat, or cold. You may have a greater risk of getting burned.   General instructions  If you were given a sedative during the procedure, it can affect you for several hours. Do not drive or operate machinery until your health care provider says that it is safe.  For the first 24 hours after the procedure: ? Do not sign important documents. ? Do not drink alcohol. ? Do your regular daily activities at a slower pace than normal. ? Eat soft foods that are easy to digest.  Take over-the-counter and prescription medicines only as told by  your health care provider.  Keep all follow-up visits as told by your health care provider. This is important. Contact a health care provider if:  You have blood in your stool 2-3 days after the procedure. Get help right away if you have:  More than a small spotting of blood in your stool.  Large blood clots in your stool.  Swelling of your abdomen.  Nausea or vomiting.  A fever.  Increasing pain in your abdomen that is not relieved with medicine. Summary  After the procedure, it is common to have a small amount of blood in your stool. You may also have mild cramping and bloating of your abdomen.  If you were given a sedative during the procedure, it can affect you for several hours. Do not drive or operate machinery until your health  care provider says that it is safe.  Get help right away if you have a lot of blood in your stool, nausea or vomiting, a fever, or increased pain in your abdomen. This information is not intended to replace advice given to you by your health care provider. Make sure you discuss any questions you have with your health care provider. Document Revised: 01/07/2019 Document Reviewed: 08/09/2018 Elsevier Patient Education  2021 [place] After This sheet gives you information about how to care for yourself after your procedure. Your health care provider may also give you more specific instructions. If you have problems or questions, contact your health care provider. What can I expect after the procedure? After the procedure, it is common to have:  Tiredness.  Forgetfulness about what happened after the procedure.  Impaired judgment for important decisions.  Nausea or vomiting.  Some difficulty with balance. Follow these instructions at home: For the time period you were told by your health care provider:  Rest as needed.  Do not participate in activities where you could fall or become injured.  Do not  drive or use machinery.  Do not drink alcohol.  Do not take sleeping pills or medicines that cause drowsiness.  Do not make important decisions or sign legal documents.  Do not take care of children on your own.      Eating and drinking  Follow the diet that is recommended by your health care provider.  Drink enough fluid to keep your urine pale yellow.  If you vomit: ? Drink water, juice, or soup when you can drink without vomiting. ? Make sure you have little or no nausea before eating solid foods. General instructions  Have a responsible adult stay with you for the time you are told. It is important to have someone help care for you until you are awake and alert.  Take over-the-counter and prescription medicines only as told by your health care provider.  If you have sleep apnea, surgery and certain medicines can increase your risk for breathing problems. Follow instructions from your health care provider about wearing your sleep device: ? Anytime you are sleeping, including during daytime naps. ? While taking prescription pain medicines, sleeping medicines, or medicines that make you drowsy.  Avoid smoking.  Keep all follow-up visits as told by your health care provider. This is important. Contact a health care provider if:  You keep feeling nauseous or you keep vomiting.  You feel light-headed.  You are still sleepy or having trouble with balance after 24 hours.  You develop a rash.  You have a fever.  You have redness or swelling around the IV site. Get help right away if:  You have trouble breathing.  You have new-onset confusion at home. Summary  For several hours after your procedure, you may feel tired. You may also be forgetful and have poor judgment.  Have a responsible adult stay with you for the time you are told. It is important to have someone help care for you until you are awake and alert.  Rest as told. Do not drive or operate machinery. Do  not drink alcohol or take sleeping pills.  Get help right away if you have trouble breathing, or if you suddenly become confused. This information is not intended to replace advice given to you by your health care provider. Make sure you discuss any questions you have with your health care provider. Document Revised: 09/29/2019 Document Reviewed: 12/16/2018 Elsevier  Elsevier Patient Education  2021 Elsevier Inc.   

## 2020-04-18 ENCOUNTER — Other Ambulatory Visit (HOSPITAL_COMMUNITY)
Admission: RE | Admit: 2020-04-18 | Discharge: 2020-04-18 | Disposition: A | Discharge status: Home / Self Care | Payer: Medicare HMO | Source: Ambulatory Visit | Attending: Gastroenterology | Admitting: Gastroenterology

## 2020-04-18 ENCOUNTER — Encounter (HOSPITAL_COMMUNITY)
Admission: RE | Admit: 2020-04-18 | Discharge: 2020-04-18 | Disposition: A | Discharge status: Home / Self Care | Payer: Medicare HMO | Source: Ambulatory Visit | Attending: Gastroenterology | Admitting: Gastroenterology

## 2020-04-18 ENCOUNTER — Other Ambulatory Visit: Payer: Self-pay

## 2020-04-18 ENCOUNTER — Other Ambulatory Visit (INDEPENDENT_AMBULATORY_CARE_PROVIDER_SITE_OTHER): Payer: Self-pay | Admitting: Gastroenterology

## 2020-04-18 ENCOUNTER — Encounter (HOSPITAL_COMMUNITY): Payer: Self-pay

## 2020-04-18 DIAGNOSIS — Z01812 Encounter for preprocedural laboratory examination: Secondary | ICD-10-CM | POA: Diagnosis not present

## 2020-04-18 DIAGNOSIS — Z20822 Contact with and (suspected) exposure to covid-19: Secondary | ICD-10-CM | POA: Insufficient documentation

## 2020-04-18 LAB — BASIC METABOLIC PANEL
Anion gap: 9 (ref 5–15)
BUN: 9 mg/dL (ref 8–23)
CO2: 21 mmol/L — ABNORMAL LOW (ref 22–32)
Calcium: 8 mg/dL — ABNORMAL LOW (ref 8.9–10.3)
Chloride: 108 mmol/L (ref 98–111)
Creatinine, Ser: 1.14 mg/dL (ref 0.61–1.24)
GFR, Estimated: >60 mL/min (ref >60)
Glucose, Bld: 103 mg/dL — ABNORMAL HIGH (ref 70–99)
Potassium: 3 mmol/L — ABNORMAL LOW (ref 3.5–5.1)
Sodium: 138 mmol/L (ref 135–145)

## 2020-04-18 LAB — CBC WITH DIFFERENTIAL/PLATELET
Abs Immature Granulocytes: 0.02 10*3/uL (ref 0.00–0.07)
Basophils Absolute: 0 10*3/uL (ref 0.0–0.1)
Basophils Relative: 1 %
Eosinophils Absolute: 0.1 10*3/uL (ref 0.0–0.5)
Eosinophils Relative: 3 %
HCT: 35.8 % — ABNORMAL LOW (ref 39.0–52.0)
Hemoglobin: 11.5 g/dL — ABNORMAL LOW (ref 13.0–17.0)
Immature Granulocytes: 1 %
Lymphocytes Relative: 11 %
Lymphs Abs: 0.4 10*3/uL — ABNORMAL LOW (ref 0.7–4.0)
MCH: 27.9 pg (ref 26.0–34.0)
MCHC: 32.1 g/dL (ref 30.0–36.0)
MCV: 86.9 fL (ref 80.0–100.0)
Monocytes Absolute: 0.4 10*3/uL (ref 0.1–1.0)
Monocytes Relative: 11 %
Neutro Abs: 2.5 10*3/uL (ref 1.7–7.7)
Neutrophils Relative %: 73 %
Platelets: 115 10*3/uL — ABNORMAL LOW (ref 150–400)
RBC: 4.12 MIL/uL — ABNORMAL LOW (ref 4.22–5.81)
RDW: 19.4 % — ABNORMAL HIGH (ref 11.5–15.5)
WBC: 3.4 10*3/uL — ABNORMAL LOW (ref 4.0–10.5)
nRBC: 0 % (ref 0.0–0.2)

## 2020-04-18 LAB — SARS CORONAVIRUS 2 (TAT 6-24 HRS): SARS Coronavirus 2: NEGATIVE

## 2020-04-18 MED ORDER — POTASSIUM CHLORIDE CRYS ER 20 MEQ PO TBCR: 60.0000 meq | EXTENDED_RELEASE_TABLET | Freq: Every day | ORAL | 0 refills | Status: DC

## 2020-04-20 ENCOUNTER — Encounter (HOSPITAL_COMMUNITY)
Admission: RE | Disposition: A | Discharge status: Home / Self Care | Payer: Self-pay | Source: Home / Self Care | Attending: Gastroenterology

## 2020-04-20 ENCOUNTER — Ambulatory Visit (HOSPITAL_COMMUNITY): Payer: Medicare HMO | Admitting: Certified Registered Nurse Anesthetist

## 2020-04-20 ENCOUNTER — Encounter (HOSPITAL_COMMUNITY): Payer: Self-pay | Admitting: Gastroenterology

## 2020-04-20 ENCOUNTER — Ambulatory Visit (HOSPITAL_COMMUNITY)
Admission: RE | Admit: 2020-04-20 | Discharge: 2020-04-20 | Disposition: A | Discharge status: Home / Self Care | Payer: Medicare HMO | Attending: Gastroenterology | Admitting: Gastroenterology

## 2020-04-20 DIAGNOSIS — D509 Iron deficiency anemia, unspecified: Secondary | ICD-10-CM

## 2020-04-20 DIAGNOSIS — E1122 Type 2 diabetes mellitus with diabetic chronic kidney disease: Secondary | ICD-10-CM | POA: Insufficient documentation

## 2020-04-20 DIAGNOSIS — Z79899 Other long term (current) drug therapy: Secondary | ICD-10-CM | POA: Diagnosis not present

## 2020-04-20 DIAGNOSIS — K5730 Diverticulosis of large intestine without perforation or abscess without bleeding: Secondary | ICD-10-CM | POA: Diagnosis not present

## 2020-04-20 DIAGNOSIS — I480 Paroxysmal atrial fibrillation: Secondary | ICD-10-CM | POA: Insufficient documentation

## 2020-04-20 DIAGNOSIS — I130 Hypertensive heart and chronic kidney disease with heart failure and stage 1 through stage 4 chronic kidney disease, or unspecified chronic kidney disease: Secondary | ICD-10-CM | POA: Insufficient documentation

## 2020-04-20 DIAGNOSIS — D124 Benign neoplasm of descending colon: Secondary | ICD-10-CM | POA: Insufficient documentation

## 2020-04-20 DIAGNOSIS — I428 Other cardiomyopathies: Secondary | ICD-10-CM | POA: Diagnosis not present

## 2020-04-20 DIAGNOSIS — Z7984 Long term (current) use of oral hypoglycemic drugs: Secondary | ICD-10-CM | POA: Insufficient documentation

## 2020-04-20 DIAGNOSIS — N189 Chronic kidney disease, unspecified: Secondary | ICD-10-CM | POA: Diagnosis not present

## 2020-04-20 DIAGNOSIS — K635 Polyp of colon: Secondary | ICD-10-CM | POA: Diagnosis not present

## 2020-04-20 DIAGNOSIS — I429 Cardiomyopathy, unspecified: Secondary | ICD-10-CM | POA: Diagnosis not present

## 2020-04-20 HISTORY — PX: BIOPSY: SHX5522

## 2020-04-20 HISTORY — PX: COLONOSCOPY WITH PROPOFOL: SHX5780

## 2020-04-20 HISTORY — PX: ESOPHAGOGASTRODUODENOSCOPY (EGD) WITH PROPOFOL: SHX5813

## 2020-04-20 HISTORY — PX: POLYPECTOMY: SHX149

## 2020-04-20 LAB — GLUCOSE, CAPILLARY
Glucose-Capillary: 83 mg/dL (ref 70–99)
Glucose-Capillary: 93 mg/dL (ref 70–99)

## 2020-04-20 LAB — HM COLONOSCOPY

## 2020-04-20 SURGERY — COLONOSCOPY WITH PROPOFOL
Anesthesia: General

## 2020-04-20 MED ORDER — KETAMINE HCL 50 MG/5ML IJ SOSY
PREFILLED_SYRINGE | INTRAMUSCULAR | Status: AC
  Filled 2020-04-20: qty 5

## 2020-04-20 MED ORDER — EPHEDRINE SULFATE-NACL 50-0.9 MG/10ML-% IV SOSY
PREFILLED_SYRINGE | INTRAVENOUS | Status: DC | PRN
  Administered 2020-04-20 (×2): 5 mg via INTRAVENOUS

## 2020-04-20 MED ORDER — LIDOCAINE HCL (CARDIAC) PF 100 MG/5ML IV SOSY
PREFILLED_SYRINGE | INTRAVENOUS | Status: DC | PRN
  Administered 2020-04-20: 50 mg via INTRATRACHEAL

## 2020-04-20 MED ORDER — KETAMINE HCL 10 MG/ML IJ SOLN
INTRAMUSCULAR | Status: DC | PRN
  Administered 2020-04-20: 15 mg via INTRAVENOUS

## 2020-04-20 MED ORDER — LACTATED RINGERS IV SOLN: INTRAVENOUS | Status: DC

## 2020-04-20 MED ORDER — SIMETHICONE 40 MG/0.6ML PO SUSP
ORAL | Status: AC
  Filled 2020-04-20: qty 0.6

## 2020-04-20 MED ORDER — PROPOFOL 10 MG/ML IV BOLUS
INTRAVENOUS | Status: DC | PRN
  Administered 2020-04-20: 30 mg via INTRAVENOUS

## 2020-04-20 MED ORDER — PROPOFOL 500 MG/50ML IV EMUL
INTRAVENOUS | Status: DC | PRN
  Administered 2020-04-20: 150 ug/kg/min via INTRAVENOUS

## 2020-04-20 MED ORDER — GLYCOPYRROLATE 0.2 MG/ML IJ SOLN
INTRAMUSCULAR | Status: DC | PRN
  Administered 2020-04-20: .1 mg via INTRAVENOUS

## 2020-04-20 NOTE — Anesthesia Postprocedure Evaluation (Signed)
Anesthesia Post Note  Patient: Lasandra Beech  Procedure(s) Performed: COLONOSCOPY WITH PROPOFOL (N/A ) ESOPHAGOGASTRODUODENOSCOPY (EGD) WITH PROPOFOL (N/A ) BIOPSY POLYPECTOMY INTESTINAL  Patient location during evaluation: PACU Anesthesia Type: General Level of consciousness: awake and alert Pain management: pain level controlled Vital Signs Assessment: post-procedure vital signs reviewed and stable Respiratory status: spontaneous breathing, nonlabored ventilation and respiratory function stable Postop Assessment: no apparent nausea or vomiting Anesthetic complications: no   No complications documented.   Last Vitals:  Vitals:   04/20/20 0628  BP: (!) 180/81  Pulse: 65  Resp: 18  Temp: 36.4 C  SpO2: 98%    Last Pain:  Vitals:   04/20/20 0732  TempSrc:   PainSc: 0-No pain                 Cena Bruhn Hristova

## 2020-04-20 NOTE — Discharge Instructions (Signed)
You are being discharged to home.  Resume your previous diet.  We are waiting for your pathology results.  Your physician has recommended a repeat colonoscopy for surveillance based on pathology results.  Return to GI clinic in 3 months. Restart Eliquis today.      Upper Endoscopy, Adult, Care After This sheet gives you information about how to care for yourself after your procedure. Your health care provider may also give you more specific instructions. If you have problems or questions, contact your health care provider. What can I expect after the procedure? After the procedure, it is common to have:  A sore throat.  Mild stomach pain or discomfort.  Bloating.  Nausea. Follow these instructions at home:  Follow instructions from your health care provider about what to eat or drink after your procedure.  Return to your normal activities as told by your health care provider. Ask your health care provider what activities are safe for you.  Take over-the-counter and prescription medicines only as told by your health care provider.  If you were given a sedative during the procedure, it can affect you for several hours. Do not drive or operate machinery until your health care provider says that it is safe.  Keep all follow-up visits as told by your health care provider. This is important.   Contact a health care provider if you have:  A sore throat that lasts longer than one day.  Trouble swallowing. Get help right away if:  You vomit blood or your vomit looks like coffee grounds.  You have: ? A fever. ? Bloody, black, or tarry stools. ? A severe sore throat or you cannot swallow. ? Difficulty breathing. ? Severe pain in your chest or abdomen. Summary  After the procedure, it is common to have a sore throat, mild stomach discomfort, bloating, and nausea.  If you were given a sedative during the procedure, it can affect you for several hours. Do not drive or operate  machinery until your health care provider says that it is safe.  Follow instructions from your health care provider about what to eat or drink after your procedure.  Return to your normal activities as told by your health care provider. This information is not intended to replace advice given to you by your health care provider. Make sure you discuss any questions you have with your health care provider. Document Revised: 01/11/2019 Document Reviewed: 06/15/2017 Elsevier Patient Education  2021 [place].     Colonoscopy, Adult, Care After This sheet gives you information about how to care for yourself after your procedure. Your doctor may also give you more specific instructions. If you have problems or questions, call your doctor. What can I expect after the procedure? After the procedure, it is common to have:  A small amount of blood in your poop (stool) for 24 hours.  Some gas.  Mild cramping or bloating in your belly (abdomen). Follow these instructions at home: Eating and drinking  Drink enough fluid to keep your pee (urine) pale yellow.  Follow instructions from your doctor about what you cannot eat or drink.  Return to your normal diet as told by your doctor. Avoid heavy or fried foods that are hard to digest.   Activity  Rest as told by your doctor.  Do not sit for a long time without moving. Get up to take short walks every 1-2 hours. This is important. Ask for help if you feel weak or unsteady.  Return to  your normal activities as told by your doctor. Ask your doctor what activities are safe for you. To help cramping and bloating:  Try walking around.  Put heat on your belly as told by your doctor. Use the heat source that your doctor recommends, such as a moist heat pack or a heating pad. ? Put a towel between your skin and the heat source. ? Leave the heat on for 20-30 minutes. ? Remove the heat if your skin turns bright red. This is very important if you  are unable to feel pain, heat, or cold. You may have a greater risk of getting burned.   General instructions  If you were given a medicine to help you relax (sedative) during your procedure, it can affect you for many hours. Do not drive or use machinery until your doctor says that it is safe.  For the first 24 hours after the procedure: ? Do not sign important documents. ? Do not drink alcohol. ? Do your daily activities more slowly than normal. ? Eat foods that are soft and easy to digest.  Take over-the-counter or prescription medicines only as told by your doctor.  Keep all follow-up visits as told by your doctor. This is important. Contact a doctor if:  You have blood in your poop 2-3 days after the procedure. Get help right away if:  You have more than a small amount of blood in your poop.  You see large clumps of tissue (blood clots) in your poop.  Your belly is swollen.  You feel like you may vomit (nauseous).  You vomit.  You have a fever.  You have belly pain that gets worse, and medicine does not help your pain. Summary  After the procedure, it is common to have a small amount of blood in your poop. You may also have mild cramping and bloating in your belly.  If you were given a medicine to help you relax (sedative) during your procedure, it can affect you for many hours. Do not drive or use machinery until your doctor says that it is safe.  Get help right away if you have a lot of blood in your poop, feel like you may vomit, have a fever, or have more belly pain. This information is not intended to replace advice given to you by your health care provider. Make sure you discuss any questions you have with your health care provider. Document Revised: 11/19/2018 Document Reviewed: 08/09/2018 Elsevier Patient Education  2021 [place] After This sheet gives you information about how to care for yourself after your  procedure. Your health care provider may also give you more specific instructions. If you have problems or questions, contact your health care provider. What can I expect after the procedure? After the procedure, it is common to have:  Tiredness.  Forgetfulness about what happened after the procedure.  Impaired judgment for important decisions.  Nausea or vomiting.  Some difficulty with balance. Follow these instructions at home: For the time period you were told by your health care provider:  Rest as needed.  Do not participate in activities where you could fall or become injured.  Do not drive or use machinery.  Do not drink alcohol.  Do not take sleeping pills or medicines that cause drowsiness.  Do not make important decisions or sign legal documents.  Do not take care of children on your own.      Eating and drinking  Follow the diet that is recommended by your health care provider.  Drink enough fluid to keep your urine pale yellow.  If you vomit: ? Drink water, juice, or soup when you can drink without vomiting. ? Make sure you have little or no nausea before eating solid foods. General instructions  Have a responsible adult stay with you for the time you are told. It is important to have someone help care for you until you are awake and alert.  Take over-the-counter and prescription medicines only as told by your health care provider.  If you have sleep apnea, surgery and certain medicines can increase your risk for breathing problems. Follow instructions from your health care provider about wearing your sleep device: ? Anytime you are sleeping, including during daytime naps. ? While taking prescription pain medicines, sleeping medicines, or medicines that make you drowsy.  Avoid smoking.  Keep all follow-up visits as told by your health care provider. This is important. Contact a health care provider if:  You keep feeling nauseous or you keep  vomiting.  You feel light-headed.  You are still sleepy or having trouble with balance after 24 hours.  You develop a rash.  You have a fever.  You have redness or swelling around the IV site. Get help right away if:  You have trouble breathing.  You have new-onset confusion at home. Summary  For several hours after your procedure, you may feel tired. You may also be forgetful and have poor judgment.  Have a responsible adult stay with you for the time you are told. It is important to have someone help care for you until you are awake and alert.  Rest as told. Do not drive or operate machinery. Do not drink alcohol or take sleeping pills.  Get help right away if you have trouble breathing, or if you suddenly become confused. This information is not intended to replace advice given to you by your health care provider. Make sure you discuss any questions you have with your health care provider. Document Revised: 09/29/2019 Document Reviewed: 12/16/2018 Elsevier Patient Education  2021 [employer].

## 2020-04-20 NOTE — Op Note (Signed)
[facility] Patient Name: Phillip Wilson Procedure Date: 04/20/2020 7:17 AM MRN: [id number] Date of Birth: [birth year] Attending MD: [name] ,  CSN: [id number] Age: 75 Admit Type: Outpatient Procedure:                Upper GI endoscopy Indications:              Iron deficiency anemia Providers:                [name], Jeanann Lewandowsky. Sharon Seller, RN, Randa Spike, Technician Referring MD:              Medicines:                Monitored Anesthesia Care Complications:            No immediate complications. Estimated Blood Loss:     Estimated blood loss: none. Procedure:                Pre-Anesthesia Assessment:                           - Prior to the procedure, a History and Physical                            was performed, and patient medications, allergies                            and sensitivities were reviewed. The patient's                            tolerance of previous anesthesia was reviewed.                           - The risks and benefits of the procedure and the                            sedation options and risks were discussed with the                            patient. All questions were answered and informed                            consent was obtained.                           After obtaining informed consent, the endoscope was                            passed under direct vision. Throughout the                            procedure, the patient's blood pressure, pulse, and                            oxygen saturations were monitored continuously. The  [id number]-[id number] ([id number]) was introduced through the                            mouth, and advanced to the second part of duodenum.                            The upper GI endoscopy was accomplished without                            difficulty. The patient tolerated the procedure                            well. Scope In: 7:38:43 AM Scope Out:  7:47:05 AM Total Procedure Duration: 0 hours 8 minutes 22 seconds  Findings:      The examined esophagus was normal.      The entire examined stomach was normal.      The examined duodenum was normal. Biopsies for histology were taken with       a cold forceps for evaluation of celiac disease. Impression:               - Normal esophagus.                           - Normal stomach.                           - Normal examined duodenum. Biopsied. Moderate Sedation:      Per Anesthesia Care Recommendation:           - Discharge patient to home (ambulatory).                           - Resume previous diet.                           - Await pathology results. Procedure Code(s):        --- Professional ---                           [phone], Esophagogastroduodenoscopy, flexible,                            transoral; with biopsy, single or multiple Diagnosis Code(s):        --- Professional ---                           D50.9, Iron deficiency anemia, unspecified CPT copyright 2019 American Medical Association. All rights reserved. The codes documented in this report are preliminary and upon coder review may  be revised to meet current compliance requirements. [name], MD [name],  04/20/2020 8:21:31 AM This report has been signed electronically. Number of Addenda: 0

## 2020-04-20 NOTE — Interval H&P Note (Signed)
History and Physical Interval Note:  04/20/2020 7:25 AM Phillip Wilson is a 75 y.o. male with PMH NICM (EF 15-20%), CKD, DM, HTN, GERD,atrial fibrillation on Eliquis, who presents for evaluation of iron deficiency anemia.  Patient reports feeling well.  Denies having any complaints at the moment, has not present any melena, hematochezia, abdominal pain, abdominal distention, fever, chills or hematemesis.  Last LID:CVUDT Last Colonoscopy:2009 - normal  BP (!) 180/81   Pulse 65   Temp 97.6 F (36.4 C) (Oral)   Resp 18   SpO2 98%  GENERAL: The patient is AO x3, in no acute distress. HEENT: Head is normocephalic and atraumatic. EOMI are intact. Mouth is well hydrated and without lesions. NECK: Supple. No masses LUNGS: Clear to auscultation. No presence of rhonchi/wheezing/rales. Adequate chest expansion HEART: RRR, normal s1 and s2. ABDOMEN: Soft, nontender, no guarding, no peritoneal signs, and nondistended. BS +. No masses. EXTREMITIES: Without any cyanosis, clubbing, rash, lesions or edema. NEUROLOGIC: AOx3, no focal motor deficit. SKIN: no jaundice, no rashes  [name]  has presented today for surgery, with the diagnosis of Iron Deficiency Anemia.  The various methods of treatment have been discussed with the patient and family. After consideration of risks, benefits and other options for treatment, the patient has consented to  Procedure(s) with comments: COLONOSCOPY WITH PROPOFOL (N/A) - am ESOPHAGOGASTRODUODENOSCOPY (EGD) WITH PROPOFOL (N/A) - Am as a surgical intervention.  The patient's history has been reviewed, patient examined, no change in status, stable for surgery.  I have reviewed the patient's chart and labs.  Questions were answered to the patient's satisfaction.     [name]

## 2020-04-20 NOTE — Transfer of Care (Signed)
Immediate Anesthesia Transfer of Care Note  Patient: Phillip Wilson  Procedure(s) Performed: COLONOSCOPY WITH PROPOFOL (N/A ) ESOPHAGOGASTRODUODENOSCOPY (EGD) WITH PROPOFOL (N/A ) BIOPSY POLYPECTOMY INTESTINAL  Patient Location: PACU  Anesthesia Type:General  Level of Consciousness: awake  Airway & Oxygen Therapy: Patient Spontanous Breathing  Post-op Assessment: Report given to RN and Post -op Vital signs reviewed and stable  Post vital signs: Reviewed and stable  Last Vitals:  Vitals Value Taken Time  BP    Temp    Pulse    Resp    SpO2      Last Pain:  Vitals:   04/20/20 0732  TempSrc:   PainSc: 0-No pain      Patients Stated Pain Goal: 5 (09/81/19 1478)  Complications: No complications documented.

## 2020-04-20 NOTE — Op Note (Signed)
[facility] Patient Name: Phillip Wilson Procedure Date: 04/20/2020 7:50 AM MRN: [id number] Date of Birth: [birth year] Attending MD: [name] ,  CSN: [id number] Age: 75 Admit Type: Outpatient Procedure:                Colonoscopy Indications:              Iron deficiency anemia Providers:                [name], Gwenlyn Fudge RN, RN, Randa Spike, Technician Referring MD:              Medicines:                Monitored Anesthesia Care Complications:            No immediate complications. Estimated Blood Loss:     Estimated blood loss: none. Procedure:                Pre-Anesthesia Assessment:                           - Prior to the procedure, a History and Physical                            was performed, and patient medications, allergies                            and sensitivities were reviewed. The patient's                            tolerance of previous anesthesia was reviewed.                           - The risks and benefits of the procedure and the                            sedation options and risks were discussed with the                            patient. All questions were answered and informed                            consent was obtained.                           After obtaining informed consent, the colonoscope                            was passed under direct vision. Throughout the                            procedure, the patient's blood pressure, pulse, and                            oxygen saturations were monitored continuously. The  PCF-[id number] ([id number]) scope was introduced through                            the anus and advanced to the the cecum, identified                            by appendiceal orifice and ileocecal valve. The                            colonoscopy was performed without difficulty. The                            patient tolerated the procedure well. The  quality                            of the bowel preparation was good. Scope withdrawal                            time was 16 minutes. Scope In: 7:51:43 AM Scope Out: 8:15:59 AM Scope Withdrawal Time: 0 hours 19 minutes 44 seconds  Total Procedure Duration: 0 hours 24 minutes 16 seconds  Findings:      The perianal and digital rectal examinations were normal.      Two sessile polyps were found in the descending colon. The polyps were 3       to 5 mm in size. These polyps were removed with a cold snare. Resection       and retrieval were complete.      A few small and large-mouthed diverticula were found in the sigmoid       colon.      The retroflexed view of the distal rectum and anal verge was normal and       showed no anal or rectal abnormalities. Impression:               - Two 3 to 5 mm polyps in the descending colon,                            removed with a cold snare. Resected and retrieved.                           - Diverticulosis in the sigmoid colon.                           - The distal rectum and anal verge are normal on                            retroflexion view. Moderate Sedation:      Per Anesthesia Care Recommendation:           - Discharge patient to home (ambulatory).                           - Resume previous diet.                           - Continue present medications, including oral  iron.                           - Restart Eliquis today.                           - Await pathology results.                           - Repeat colonoscopy for surveillance based on                            pathology results.                           - Return to GI clinic in 3 months. Procedure Code(s):        --- Professional ---                           [phone], Colonoscopy, flexible; with removal of                            tumor(s), polyp(s), or other lesion(s) by snare                            technique Diagnosis Code(s):        --- Professional ---                            K63.5, Polyp of colon                           D50.9, Iron deficiency anemia, unspecified                           K57.30, Diverticulosis of large intestine without                            perforation or abscess without bleeding CPT copyright 2019 American Medical Association. All rights reserved. The codes documented in this report are preliminary and upon coder review may  be revised to meet current compliance requirements. [name], MD [name],  04/20/2020 8:26:13 AM This report has been signed electronically. Number of Addenda: 0

## 2020-04-20 NOTE — Anesthesia Preprocedure Evaluation (Addendum)
Anesthesia Evaluation  Patient identified by MRN, date of birth, ID band Patient awake    Reviewed: Allergy & Precautions, H&P , NPO status , Patient's Chart, lab work & pertinent test results, reviewed documented beta blocker date and time   Airway Mallampati: II  TM Distance: >3 FB Neck ROM: full    Dental no notable dental hx.    Pulmonary neg pulmonary ROS,    Pulmonary exam normal breath sounds clear to auscultation       Cardiovascular Exercise Tolerance: Good hypertension, +CHF  Atrial Fibrillation  Rhythm:irregular Rate:Bradycardia     Neuro/Psych negative neurological ROS  negative psych ROS   GI/Hepatic Neg liver ROS, hiatal hernia, GERD  Medicated,  Endo/Other  negative endocrine ROSdiabetes  Renal/GU CRFRenal disease  negative genitourinary   Musculoskeletal   Abdominal   Peds  Hematology  (+) Blood dyscrasia, anemia ,   Anesthesia Other Findings   Reproductive/Obstetrics negative OB ROS                            Anesthesia Physical Anesthesia Plan  ASA: III  Anesthesia Plan: General   Post-op Pain Management:    Induction:   PONV Risk Score and Plan: Propofol infusion  Airway Management Planned:   Additional Equipment:   Intra-op Plan:   Post-operative Plan:   Informed Consent: I have reviewed the patients History and Physical, chart, labs and discussed the procedure including the risks, benefits and alternatives for the proposed anesthesia with the patient or authorized representative who has indicated his/her understanding and acceptance.     Dental Advisory Given  Plan Discussed with: CRNA  Anesthesia Plan Comments:         Anesthesia Quick Evaluation

## 2020-04-23 LAB — SURGICAL PATHOLOGY

## 2020-04-24 ENCOUNTER — Encounter (INDEPENDENT_AMBULATORY_CARE_PROVIDER_SITE_OTHER): Payer: Self-pay | Admitting: *Deleted

## 2020-04-26 ENCOUNTER — Encounter (INDEPENDENT_AMBULATORY_CARE_PROVIDER_SITE_OTHER): Payer: Self-pay

## 2020-04-27 ENCOUNTER — Encounter (HOSPITAL_COMMUNITY): Payer: Self-pay | Admitting: Gastroenterology

## 2020-04-27 ENCOUNTER — Encounter (HOSPITAL_COMMUNITY): Payer: Medicare HMO | Admitting: Cardiology

## 2020-05-07 ENCOUNTER — Telehealth: Payer: Self-pay | Admitting: Family Medicine

## 2020-05-07 NOTE — Progress Notes (Deleted)
Chronic Care Management Pharmacy Note  05/07/2020 Name:  Phillip Wilson MRN:  [id number] DOB:  09-Jun-1945  Subjective: Phillip Wilson is an 75 y.o. year old male who is a primary patient of [place], Modena Nunnery, MD.  The CCM team was consulted for assistance with disease management and care coordination needs.    Engaged with patient face to face for initial visit in response to provider referral for pharmacy case management and/or care coordination services.   Consent to Services:  The patient was given the following information about Chronic Care Management services today, agreed to services, and gave verbal consent: 1. CCM service includes personalized support from designated clinical staff supervised by the primary care provider, including individualized plan of care and coordination with other care providers 2. 24/7 contact phone numbers for assistance for urgent and routine care needs. 3. Service will only be billed when office clinical staff spend 20 minutes or more in a month to coordinate care. 4. Only one practitioner may furnish and bill the service in a calendar month. 5.The patient may stop CCM services at any time (effective at the end of the month) by phone call to the office staff. 6. The patient will be responsible for cost sharing (co-pay) of up to 20% of the service fee (after annual deductible is met). Patient agreed to services and consent obtained.  Patient Care Team: Alycia Rossetti, MD as PCP - General (Family Medicine) Edythe Clarity, [facility] as Pharmacist (Pharmacist)  Recent office visits: None in past 6 months  Recent consult visits: 04/06/20 Ahmed Prima) - stopped Jardiance due to GFR and added hydralazine 12.29m BID,  No other medication changes at this visit  04/05/20 (Jenetta Downer -   03/23/20 (Theador Hawthorne - Patient having AKI, holding all RAS blockers Hospital visits: None in previous 6 months  Objective:  Lab Results  Component Value Date   CREATININE  1.14 04/18/2020   BUN 9 04/18/2020   GFRNONAA >60 04/18/2020   GFRAA 54 (L) 07/12/2019   NA 138 04/18/2020   K 3.0 (L) 04/18/2020   CALCIUM 8.0 (L) 04/18/2020   CO2 21 (L) 04/18/2020   GLUCOSE 103 (H) 04/18/2020    Lab Results  Component Value Date/Time   HGBA1C 6.4 (H) 10/18/2019 09:33 AM   HGBA1C 6.9 (H) 07/12/2019 11:42 AM   MICROALBUR 3.5 07/12/2019 11:42 AM   MICROALBUR 13.3 11/13/2014 11:00 AM    Last diabetic Eye exam: No results found for: HMDIABEYEEXA  Last diabetic Foot exam:  Lab Results  Component Value Date/Time   HMDIABFOOTEX yes  05/17/2009 12:00 AM     Lab Results  Component Value Date   CHOL 155 06/01/2019   HDL 53 06/01/2019   LDLCALC 77 06/01/2019   LDLDIRECT 57 01/08/2012   TRIG 146 06/01/2019   CHOLHDL 2.9 06/01/2019    Hepatic Function Latest Ref Rng & Units 10/18/2019 07/12/2019 03/14/2019  Total Protein 6.1 - 8.1 g/dL 6.1 6.3 6.9  Albumin 3.5 - 5.0 g/dL - - -  AST 10 - 35 U/L 72(H) 15 17  ALT 9 - 46 U/L 78(H) 7(L) 5(L)  Alk Phosphatase 38 - 126 U/L - - -  Total Bilirubin 0.2 - 1.2 mg/dL 4.0(H) 0.9 0.5  Bilirubin, Direct 0.0 - 0.2 mg/dL - - -    Lab Results  Component Value Date/Time   TSH 4.36 03/14/2019 12:28 PM   TSH 6.077 (H) 10/21/2018 09:22 AM   TSH 7.662 (H) 12/28/2017 06:12 PM   TSH 2.69  07/22/2017 09:44 AM   FREET4 1.4 03/14/2019 12:28 PM    CBC Latest Ref Rng & Units 04/18/2020 10/18/2019 07/12/2019  WBC 4.0 - 10.5 K/uL 3.4(L) 5.8 7.8  Hemoglobin 13.0 - 17.0 g/dL 11.5(L) 14.0 13.2  Hematocrit 39.0 - 52.0 % 35.8(L) 45.7 41.4  Platelets 150 - 400 K/uL 115(L) 121(L) 168    Lab Results  Component Value Date/Time   VD25OH <10 ng/mL (L) 05/14/2009 06:59 PM    Clinical ASCVD: {YES/NO:21197} The ASCVD Risk score Phillip Wilson., et al., 2013) failed to calculate for the following reasons:   The patient has a prior MI or stroke diagnosis    Depression screen [facility] 2/9 07/12/2019 03/14/2019 02/19/2018  Decreased Interest 0 0 0  Down,  Depressed, Hopeless 0 0 0  PHQ - 2 Score 0 0 0  Altered sleeping - 0 -  Tired, decreased energy - 0 -  Change in appetite - 0 -  Feeling bad or failure about yourself  - 0 -  Trouble concentrating - 0 -  Moving slowly or fidgety/restless - 0 -  Suicidal thoughts - 0 -  PHQ-9 Score - 0 -  Difficult doing work/chores - Not difficult at all -  Some recent data might be hidden     ***Other: (CHADS2VASc if Afib, MMRC or CAT for COPD, ACT, DEXA)  Social History   Tobacco Use  Smoking Status Never Smoker  Smokeless Tobacco Never Used   BP Readings from Last 3 Encounters:  04/20/20 (!) 146/76  04/18/20 (!) 172/79  04/16/20 (!) 148/85   Pulse Readings from Last 3 Encounters:  04/20/20 60  04/18/20 (!) 55  04/16/20 (!) 55   Wt Readings from Last 3 Encounters:  04/18/20 195 lb (88.5 kg)  04/06/20 199 lb 9.6 oz (90.5 kg)  04/05/20 198 lb (89.8 kg)   BMI Readings from Last 3 Encounters:  04/18/20 29.65 kg/m  04/06/20 30.35 kg/m  04/05/20 30.11 kg/m    Assessment/Interventions: Review of patient past medical history, allergies, medications, health status, including review of consultants reports, laboratory and other test data, was performed as part of comprehensive evaluation and provision of chronic care management services.   SDOH:  (Social Determinants of Health) assessments and interventions performed: {yes/no:20286}  SDOH Screenings   Alcohol Screen: Low Risk   . Last Alcohol Screening Score (AUDIT): 0  Depression (PHQ2-9): Low Risk   . PHQ-2 Score: 0  Financial Resource Strain: Not on file  Food Insecurity: Not on file  Housing: Not on file  Physical Activity: Not on file  Social Connections: Not on file  Stress: Not on file  Tobacco Use: Low Risk   . Smoking Tobacco Use: Never Smoker  . Smokeless Tobacco Use: Never Used  Transportation Needs: Not on file    CCM Care Plan  Allergies  Allergen Reactions  . Entresto [Sacubitril-Valsartan]     BLE Edema/  Blisters     Medications Reviewed Today    Reviewed by Louann Sjogren, MD (Physician) on 04/20/20 at [phone]  Med List Status: Contact Pharm Tech - Unable to reach patient  Medication Order Taking? Sig Documenting Provider Last Dose Status Informant  allopurinol (ZYLOPRIM) 100 MG tablet [id number]  TAKE 1/2 (ONE-HALF) TABLET BY MOUTH ONCE DAILY FOR GOUT [place], Modena Nunnery, MD  Active   amiodarone (PACERONE) 200 MG tablet [id number] Yes Take 1 tablet (200 mg total) by mouth daily. Erma Heritage, PA-C 04/20/2020 Unknown time Active   apixaban (ELIQUIS) 5 MG TABS  tablet [id number] No Take 1 tablet (5 mg total) by mouth 2 (two) times daily. Alycia Rossetti, MD 04/11/2020 Active            Med Note Deri Fuelling, Antionette Fairy   Wed Apr 18, 2020 10:59 AM) Last dose 3-16. On hold for procedure.  atorvastatin (LIPITOR) 40 MG tablet [id number] Yes Take 1 tablet (40 mg total) by mouth daily. Alycia Rossetti, MD 04/19/2020 Unknown time Active   calcitRIOL (ROCALTROL) 0.25 MCG capsule [id number] Yes .25 mcg 3 times a week. Monday , Wednesday, and Friday Alycia Rossetti, MD 04/19/2020 Unknown time Active   hydrALAZINE (APRESOLINE) 25 MG tablet [id number] Yes Take 0.5 tablets (12.5 mg total) by mouth 2 (two) times daily. Erma Heritage, PA-C 04/20/2020 Unknown time Active   metoprolol succinate (TOPROL-XL) 25 MG 24 hr tablet [id number] Yes TAKE 1 Tablet twice a day Vic Blackbird F, MD 04/20/2020 Unknown time Active   polyethylene glycol-electrolytes (TRILYTE) 420 g solution [id number] Yes Take 4,000 mLs by mouth as directed. Harvel Quale, MD 04/19/2020 Unknown time Active   potassium chloride SA (KLOR-CON) 20 MEQ tablet [id number] Yes Take 3 tablets (60 mEq total) by mouth daily for 2 days. Harvel Quale, MD 04/19/2020 Unknown time Active   tamsulosin (FLOMAX) 0.4 MG CAPS capsule [id number] Yes Take 1 capsule (0.4 mg total) by mouth daily. Alycia Rossetti, MD 04/19/2020 Unknown time Active    torsemide (DEMADEX) 20 MG tablet [id number] Yes Take 1 tablet (20 mg total) by mouth in the morning, at noon, and at bedtime. Alycia Rossetti, MD 04/19/2020 Unknown time Active           Patient Active Problem List   Diagnosis Date Noted  . Iron deficiency anemia 04/05/2020  . Gout attack 03/14/2019  . Stage 3 chronic kidney disease ([place])   . CHF (congestive heart failure) ([place]) 02/27/2016  . Hypokalemia 02/05/2016  . Bilateral lower extremity edema   . Gastroesophageal reflux disease   . Type 2 diabetes mellitus with diabetic nephropathy, without long-term current use of insulin ([place])   . Cardiomyopathy ([place]) 12/28/2010  . Atrial fibrillation with rapid ventricular response ([place]) 12/28/2010  . Vitamin D deficiency 09/03/2010  . Hyperlipidemia 05/05/2006  . HTN (hypertension) 04/02/2006    Immunization History  Administered Date(s) Administered  . Fluad Quad(high Dose 65+) 10/21/2018  . Influenza Split 01/22/2011, 10/09/2011  . Influenza Whole 01/18/2007, 10/18/2007  . Influenza, High Dose Seasonal PF 11/21/2016, 02/17/2018  . Influenza,inj,Quad PF,6+ Mos 12/26/2013, 11/13/2014  . Influenza-Unspecified 12/26/2015  . Pneumococcal Conjugate-13 04/11/2013  . Pneumococcal Polysaccharide-23 05/17/2009, 02/07/2016  . Td 05/17/2009  . Zoster 05/03/2012    Conditions to be addressed/monitored:  HTN, Afib, CHF, GERD, Type II DM w/ CKD, HLD  There are no care plans that you recently modified to display for this patient.    Medication Assistance: {MEDASSISTANCEINFO:25044}  Patient's preferred pharmacy is:  [place], [place] - [address] [place] [address] Phone: [phone] Fax: [place], [place] - [place] [address] Phone: [phone] Fax: [phone]  Uses pill box? {Yes or If no, why not?:20788} Pt endorses ***% compliance  We discussed:  {Pharmacy options:24294} Patient decided to: {US Pharmacy Plan:23885}  Care Plan and Follow Up Patient Decision:  {FOLLOWUP:24991}  Plan: {CM FOLLOW UP PLAN:25073}  *** Current Barriers:  . {pharmacybarriers:24917} . ***  Pharmacist Clinical Goal(s):  .  Patient will {PHARMACYGOALCHOICES:24921} through collaboration with PharmD and provider.  . ***  Interventions: . 1:1 collaboration with Buelah Manis, Modena Nunnery, MD regarding development and update of comprehensive plan of care as evidenced by provider attestation and co-signature . Inter-disciplinary care team collaboration (see longitudinal plan of care) . Comprehensive medication review performed; medication list updated in electronic medical record  Hypertension (BP goal {CHL HP UPSTREAM Pharmacist BP ranges:[phone]}) -{US controlled/uncontrolled:25276} -Current treatment: . Hydralazine 64m one-half tablet po bid . Metoprolol XL 269mdaily -Medications previously tried: ***  -Current home readings: *** -Current dietary habits: *** -Current exercise habits: *** -{ACTIONS;DENIES/REPORTS:21021675::"Denies"} hypotensive/hypertensive symptoms -Educated on {CCM BP Counseling:25124} -Counseled to monitor BP at home ***, document, and provide log at future appointments -{CCMPHARMDINTERVENTION:25122}  Hyperlipidemia: (LDL goal < ***) -{US controlled/uncontrolled:25276} -Current treatment: . Atorvastatin 4057maily -Medications previously tried: ***  -Current dietary patterns: *** -Current exercise habits: *** -Educated on {CCM HLD Counseling:25126} -{CCMPHARMDINTERVENTION:25122}  Diabetes (A1c goal {A1c goals:23924}) -{US controlled/uncontrolled:25276} -Current medications: . None -Medications previously tried: ***  -Current home glucose readings . fasting glucose: *** . post prandial glucose: *** -{ACTIONS;DENIES/REPORTS:21021675::"Denies"} hypoglycemic/hyperglycemic symptoms -Current meal patterns:  . breakfast: ***   . lunch: ***  . dinner: *** . snacks: *** . drinks: *** -Current exercise: *** -Educated on {CCM DM COUNSELING:25123} -Counseled to check feet daily and get yearly eye exams -{CCMPHARMDINTERVENTION:25122}  Atrial Fibrillation (Goal: prevent stroke and major bleeding) -{US controlled/uncontrolled:25276} -CHADSVASC: *** -Current treatment: . Rate control: Metoprolol XL 73m74mily . Anticoagulation: Eliquis 5mg 58m -Medications previously tried: *** -Home BP and HR readings: ***  -Counseled on {CCMAFIBCOUNSELING:25120} -{CCMPHARMDINTERVENTION:25122}  Heart Failure (Goal: manage symptoms and prevent exacerbations) -{US controlled/uncontrolled:25276} -Last ejection fraction: *** (Date: ***) -HF type: {type of heart failure:30421350} -NYHA Class: {CHL HP Upstream Pharm NYHA Class:[phone]} -AHA HF Stage: {CHL HP Upstream Pharm AHA HF Stage:[phone]} -Current treatment: . Torsemide 20mg 58mtoprolol XL 73mg d21m -Medications previously tried: ***  -Current home BP/HR readings: *** -Current dietary habits: *** -Current exercise habits: *** -Educated on {CCM HF Counseling:25125} -{CCMPHARMDINTERVENTION:25122}  GERD (Goal: ***) -{US controlled/uncontrolled:25276} -Current treatment  . None currently -Medications previously tried: ***  -{CCMPHARMDINTERVENTION:25122}   Patient Goals/Self-Care Activities . Patient will:  - {pharmacypatientgoals:24919}  Follow Up Plan: {CM FOLLOW UP PLAN:22TXLE:17471}

## 2020-05-07 NOTE — Progress Notes (Signed)
  Chronic Care Management   Note  05/07/2020 Name: DAILEN MCCLISH MRN: [id number] DOB: 1945-12-04  Lasandra Beech is a 75 y.o. year old male who is a primary care patient of [place], Modena Nunnery, MD. I reached out to [facility] by phone today in response to a referral sent by Mr. Nicki Gracy Shahan's PCP, Alycia Rossetti, MD.   Mr. Najarian was given information about Chronic Care Management services today including:  1. CCM service includes personalized support from designated clinical staff supervised by his physician, including individualized plan of care and coordination with other care providers 2. 24/7 contact phone numbers for assistance for urgent and routine care needs. 3. Service will only be billed when office clinical staff spend 20 minutes or more in a month to coordinate care. 4. Only one practitioner may furnish and bill the service in a calendar month. 5. The patient may stop CCM services at any time (effective at the end of the month) by phone call to the office staff.   Patient agreed to services and verbal consent obtained.   Follow up plan:   Carley Perdue [employer] Scheduler

## 2020-05-08 ENCOUNTER — Telehealth: Payer: Self-pay | Admitting: Pharmacist

## 2020-05-08 NOTE — Progress Notes (Addendum)
    Chronic Care Management Pharmacy Assistant   Name: Phillip Wilson  MRN: [id number] DOB: 1945-11-30  Reason for Encounter: Initial Questions  Medications: Outpatient Encounter Medications as of 05/08/2020  Medication Sig Note   allopurinol (ZYLOPRIM) 100 MG tablet TAKE 1/2 (ONE-HALF) TABLET BY MOUTH ONCE DAILY FOR GOUT    amiodarone (PACERONE) 200 MG tablet Take 1 tablet (200 mg total) by mouth daily.    apixaban (ELIQUIS) 5 MG TABS tablet Take 1 tablet (5 mg total) by mouth 2 (two) times daily. 04/18/2020: Last dose 3-16. On hold for procedure.   atorvastatin (LIPITOR) 40 MG tablet Take 1 tablet (40 mg total) by mouth daily.    calcitRIOL (ROCALTROL) 0.25 MCG capsule .25 mcg 3 times a week. Monday , Wednesday, and Friday    hydrALAZINE (APRESOLINE) 25 MG tablet Take 0.5 tablets (12.5 mg total) by mouth 2 (two) times daily.    metoprolol succinate (TOPROL-XL) 25 MG 24 hr tablet TAKE 1 Tablet twice a day    potassium chloride SA (KLOR-CON) 20 MEQ tablet Take 3 tablets (60 mEq total) by mouth daily for 2 days.    tamsulosin (FLOMAX) 0.4 MG CAPS capsule Take 1 capsule (0.4 mg total) by mouth daily.    torsemide (DEMADEX) 20 MG tablet Take 1 tablet (20 mg total) by mouth in the morning, at noon, and at bedtime.    No facility-administered encounter medications on file as of 05/08/2020.   Have you seen any other providers since your last visit? Patients daughter stated no.  Any changes in your medications or health? Patients daughter stated no.  Any side effects from any medications? Patients daughter stated no.  Do you have an symptoms or problems not managed by your medications? Patients daughter stated her fathers continues to have gout in his hands.  Any concerns about your health right now? Patients daughter stated no.  Has your provider asked that you check blood pressure, blood sugar, or follow special diet at home? Patients daughter stated he is suppose to stay away from salt.    Do you get any type of exercise on a regular basis? Patients daughter stated no  Can you think of a goal you would like to reach for your health? Patients daughter stated to get better with his eating habits.  Do you have any problems getting your medications? Patients daughter stated no  Is there anything that you would like to discuss during the appointment? Patients daughter stated no.   Please bring medications and supplements to appointment, patients daughter reminded of appointment on 4/14 at 3 pm face to face.  Follow-Up:Pharmacist Review   Charlann Lange, RMA Clinical Pharmacist Assistant [phone]  5 minutes spent in review, coordination, and documentation.  Reviewed by: Beverly Milch, PharmD Clinical Pharmacist [facility] [phone]

## 2020-05-10 ENCOUNTER — Ambulatory Visit: Payer: Medicare HMO

## 2020-05-13 DIAGNOSIS — M19042 Primary osteoarthritis, left hand: Secondary | ICD-10-CM | POA: Diagnosis not present

## 2020-05-13 DIAGNOSIS — M109 Gout, unspecified: Secondary | ICD-10-CM | POA: Diagnosis not present

## 2020-05-13 DIAGNOSIS — M7989 Other specified soft tissue disorders: Secondary | ICD-10-CM | POA: Diagnosis not present

## 2020-05-13 DIAGNOSIS — M10342 Gout due to renal impairment, left hand: Secondary | ICD-10-CM | POA: Diagnosis not present

## 2020-05-13 DIAGNOSIS — R2232 Localized swelling, mass and lump, left upper limb: Secondary | ICD-10-CM | POA: Diagnosis not present

## 2020-05-13 DIAGNOSIS — I130 Hypertensive heart and chronic kidney disease with heart failure and stage 1 through stage 4 chronic kidney disease, or unspecified chronic kidney disease: Secondary | ICD-10-CM | POA: Diagnosis not present

## 2020-05-13 DIAGNOSIS — D509 Iron deficiency anemia, unspecified: Secondary | ICD-10-CM | POA: Diagnosis not present

## 2020-05-13 DIAGNOSIS — I4891 Unspecified atrial fibrillation: Secondary | ICD-10-CM | POA: Diagnosis not present

## 2020-05-13 DIAGNOSIS — I340 Nonrheumatic mitral (valve) insufficiency: Secondary | ICD-10-CM | POA: Diagnosis not present

## 2020-05-13 DIAGNOSIS — E1122 Type 2 diabetes mellitus with diabetic chronic kidney disease: Secondary | ICD-10-CM | POA: Diagnosis not present

## 2020-05-13 DIAGNOSIS — I509 Heart failure, unspecified: Secondary | ICD-10-CM | POA: Diagnosis not present

## 2020-05-13 DIAGNOSIS — Z7901 Long term (current) use of anticoagulants: Secondary | ICD-10-CM | POA: Diagnosis not present

## 2020-05-15 ENCOUNTER — Other Ambulatory Visit: Payer: Self-pay | Admitting: Family Medicine

## 2020-05-18 ENCOUNTER — Ambulatory Visit: Payer: Medicare HMO | Admitting: Student
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
## 2020-05-29 ENCOUNTER — Ambulatory Visit (HOSPITAL_COMMUNITY)
Admission: RE | Admit: 2020-05-29 | Discharge: 2020-05-29 | Disposition: A | Discharge status: Home / Self Care | Payer: Medicare HMO | Source: Ambulatory Visit | Attending: Cardiology | Admitting: Cardiology

## 2020-05-29 ENCOUNTER — Other Ambulatory Visit: Payer: Self-pay

## 2020-05-29 ENCOUNTER — Encounter (HOSPITAL_COMMUNITY): Payer: Self-pay | Admitting: Cardiology

## 2020-05-29 ENCOUNTER — Other Ambulatory Visit (HOSPITAL_COMMUNITY): Payer: Self-pay

## 2020-05-29 VITALS — BP 150/72 | HR 50 | Wt 194.4 lb

## 2020-05-29 DIAGNOSIS — I480 Paroxysmal atrial fibrillation: Secondary | ICD-10-CM | POA: Insufficient documentation

## 2020-05-29 DIAGNOSIS — M1A00X0 Idiopathic chronic gout, unspecified site, without tophus (tophi): Secondary | ICD-10-CM | POA: Diagnosis not present

## 2020-05-29 DIAGNOSIS — I340 Nonrheumatic mitral (valve) insufficiency: Secondary | ICD-10-CM | POA: Insufficient documentation

## 2020-05-29 DIAGNOSIS — I5022 Chronic systolic (congestive) heart failure: Secondary | ICD-10-CM | POA: Diagnosis not present

## 2020-05-29 DIAGNOSIS — Z79899 Other long term (current) drug therapy: Secondary | ICD-10-CM | POA: Diagnosis not present

## 2020-05-29 DIAGNOSIS — I429 Cardiomyopathy, unspecified: Secondary | ICD-10-CM

## 2020-05-29 DIAGNOSIS — I130 Hypertensive heart and chronic kidney disease with heart failure and stage 1 through stage 4 chronic kidney disease, or unspecified chronic kidney disease: Secondary | ICD-10-CM | POA: Diagnosis not present

## 2020-05-29 DIAGNOSIS — Z8249 Family history of ischemic heart disease and other diseases of the circulatory system: Secondary | ICD-10-CM | POA: Diagnosis not present

## 2020-05-29 DIAGNOSIS — N401 Enlarged prostate with lower urinary tract symptoms: Secondary | ICD-10-CM | POA: Diagnosis not present

## 2020-05-29 DIAGNOSIS — I5042 Chronic combined systolic (congestive) and diastolic (congestive) heart failure: Secondary | ICD-10-CM | POA: Diagnosis not present

## 2020-05-29 DIAGNOSIS — Z833 Family history of diabetes mellitus: Secondary | ICD-10-CM | POA: Insufficient documentation

## 2020-05-29 DIAGNOSIS — Z7984 Long term (current) use of oral hypoglycemic drugs: Secondary | ICD-10-CM | POA: Insufficient documentation

## 2020-05-29 DIAGNOSIS — I10 Essential (primary) hypertension: Secondary | ICD-10-CM | POA: Diagnosis not present

## 2020-05-29 DIAGNOSIS — Z7901 Long term (current) use of anticoagulants: Secondary | ICD-10-CM | POA: Diagnosis not present

## 2020-05-29 DIAGNOSIS — N189 Chronic kidney disease, unspecified: Secondary | ICD-10-CM | POA: Insufficient documentation

## 2020-05-29 DIAGNOSIS — E1122 Type 2 diabetes mellitus with diabetic chronic kidney disease: Secondary | ICD-10-CM | POA: Insufficient documentation

## 2020-05-29 DIAGNOSIS — Z0189 Encounter for other specified special examinations: Secondary | ICD-10-CM | POA: Diagnosis not present

## 2020-05-29 DIAGNOSIS — E782 Mixed hyperlipidemia: Secondary | ICD-10-CM | POA: Diagnosis not present

## 2020-05-29 LAB — CBC
HCT: 42.4 % (ref 39.0–52.0)
Hemoglobin: 13.1 g/dL (ref 13.0–17.0)
MCH: 27.6 pg (ref 26.0–34.0)
MCHC: 30.9 g/dL (ref 30.0–36.0)
MCV: 89.3 fL (ref 80.0–100.0)
Platelets: 164 10*3/uL (ref 150–400)
RBC: 4.75 MIL/uL (ref 4.22–5.81)
RDW: 15.9 % — ABNORMAL HIGH (ref 11.5–15.5)
WBC: 4.6 10*3/uL (ref 4.0–10.5)
nRBC: 0 % (ref 0.0–0.2)

## 2020-05-29 LAB — COMPREHENSIVE METABOLIC PANEL
ALT: 14 U/L (ref 0–44)
AST: 33 U/L (ref 15–41)
Albumin: 3.2 g/dL — ABNORMAL LOW (ref 3.5–5.0)
Alkaline Phosphatase: 197 U/L — ABNORMAL HIGH (ref 38–126)
Anion gap: 7 (ref 5–15)
BUN: 11 mg/dL (ref 8–23)
CO2: 26 mmol/L (ref 22–32)
Calcium: 8.4 mg/dL — ABNORMAL LOW (ref 8.9–10.3)
Chloride: 105 mmol/L (ref 98–111)
Creatinine, Ser: 1.28 mg/dL — ABNORMAL HIGH (ref 0.61–1.24)
GFR, Estimated: 59 mL/min — ABNORMAL LOW (ref >60)
Glucose, Bld: 95 mg/dL (ref 70–99)
Potassium: 3.4 mmol/L — ABNORMAL LOW (ref 3.5–5.1)
Sodium: 138 mmol/L (ref 135–145)
Total Bilirubin: 1.2 mg/dL (ref 0.3–1.2)
Total Protein: 6.6 g/dL (ref 6.5–8.1)

## 2020-05-29 LAB — LIPID PANEL
Cholesterol: 135 mg/dL (ref 0–200)
HDL: 53 mg/dL (ref >40)
LDL Cholesterol: 63 mg/dL (ref 0–99)
Total CHOL/HDL Ratio: 2.5 RATIO
Triglycerides: 93 mg/dL (ref <150)
VLDL: 19 mg/dL (ref 0–40)

## 2020-05-29 LAB — TSH: TSH: 3.606 u[IU]/mL (ref 0.350–4.500)

## 2020-05-29 MED ORDER — ISOSORBIDE MONONITRATE ER 30 MG PO TB24: 30.0000 mg | ORAL_TABLET | Freq: Every day | ORAL | 3 refills | Status: DC

## 2020-05-29 MED ORDER — DAPAGLIFLOZIN PROPANEDIOL 10 MG PO TABS: 10.0000 mg | ORAL_TABLET | Freq: Every day | ORAL | 11 refills | Status: DC

## 2020-05-29 MED ORDER — DAPAGLIFLOZIN PROPANEDIOL 10 MG PO TABS: 10.0000 mg | ORAL_TABLET | Freq: Every day | ORAL | 3 refills | Status: DC

## 2020-05-29 MED ORDER — HYDRALAZINE HCL 25 MG PO TABS: 25.0000 mg | ORAL_TABLET | Freq: Three times a day (TID) | ORAL | 3 refills | Status: DC

## 2020-05-29 NOTE — Patient Instructions (Addendum)
INCREASE Hydralazine to 25 mg, one tab three times a day START Imdur to 30 mg, one tab daily START Farxiga 10 mg, one tab daily  Labs today We will only contact you if something comes back abnormal or we need to make some changes. Otherwise no news is good news!  Your physician has requested that you have a cardiac MRI. Cardiac MRI uses a computer to create images of your heart as its beating, producing both still and moving pictures of your heart and major blood vessels. For further information please visit [email or web address].[email or web address] Please follow the instruction sheet given to you today for more information. -one this is approved with insurance, a scheduler will be in contact to schedule  Your physician recommends that you schedule a follow-up appointment in: 4-8 weeks with Dr Aundra Dubin  Do the following things EVERYDAY: 1) Weigh yourself in the morning before breakfast. Write it down and keep it in a log. 2) Take your medicines as prescribed 3) Eat low salt foods--Limit salt (sodium) to 2000 mg per day.  4) Stay as active as you can everyday 5) Limit all fluids for the day to less than 2 liters  At the [facility], you and your health needs are our priority. As part of our continuing mission to provide you with exceptional heart care, we have created designated Provider Care Teams. These Care Teams include your primary Cardiologist (physician) and Advanced Practice Providers (APPs- Physician Assistants and Nurse Practitioners) who all work together to provide you with the care you need, when you need it.   You may see any of the following providers on your designated Care Team at your next follow up: Marland Kitchen Dr Glori Bickers . Dr Loralie Champagne . Dr Vickki Muff . Darrick Grinder, NP . Lyda Jester, [place] . Audry Riles, PharmD   Please be sure to bring in all your medications bottles to every appointment.         You are scheduled for a Cardiac  Catheterization on Thursday, May 12 with Dr. Loralie Champagne.  1. Please arrive at the [facility] (Main Entrance A) at [facility]: [address] [place], [place] [address] at 8:30 AM (This time is two hours before your procedure to ensure your preparation). Free valet parking service is available.   Special note: Every effort is made to have your procedure done on time. Please understand that emergencies sometimes delay scheduled procedures.  2. Diet: Do not eat solid foods after midnight.  The patient may have clear liquids until 5am upon the day of the procedure.  3.  Labs: pre procedure blood work was done 05/29/20  You will need a pre procedure COVID test    WHEN:  5/9/22anytime between 9am-3pm WHERE: COVID Test Site [address], [place] [address]  This is a drive thru testing site, you will remain in your car. Be sure to get in the line FOR PROCEDURES Once you have been swabbed you will need to remain home in quarantine until you return for your procedure.   4. Medication instructions in preparation for your procedure:   Contrast Allergy: No    Stop taking Eliquis (Apixiban) on Wednesday, May 11. and Thursday, May 12.    On the morning of your procedure, take your Aspirin and any morning medicines NOT listed above.  You may use sips of water.  5. Plan for one night stay--bring personal belongings. 6. Bring a current list of your medications  and current insurance cards. 7. You MUST have a responsible person to drive you home. 8. Someone MUST be with you the first 24 hours after you arrive home or your discharge will be delayed. 9. Please wear clothes that are easy to get on and off and wear slip-on shoes.  Thank you for allowing [place] to care for you!   -- [facility]

## 2020-05-30 NOTE — Progress Notes (Signed)
PCP: Dr. Buelah Manis Cardiology: [facility], formerly [facility]  75 y.o. with history of cardiomyopathy (never had full workup of etiology), HTN, CKD, paroxysmal atrial fibrillation and NSVT was referred by Bernerd Pho, PA, for evaluation of CHF. Much of history was derived from the patient's sister.  The patient was not sure what medications he was taking but his sister had the bottles.  He has had a cardiomyopathy since at least 2012, echo at that time showed EF 30-35%.  EF has been low since that time, most recent echo was in 9/21 with LV EF <15%, moderate-severe likely functional MR, and moderate RV dysfunction.  He has a history of NSVT and is on amiodarone.  He has never had a full workup for etiology of cardiomyopathy (no cath, cMRI, etc).  The prior notes report significant noncompliance with cardiology followup though he seems to be taking his medications appropriately with the help of his sister.  He was never implanted with an ICD due to noncompliance per the notes. He has not been able to take Entresto due to hives.  His last hospitalization was in 9/21 at [facility] with CHF and atrial fibrillation/RVR.  He had cardiogenic shock and was on norepinephrine transiently.  He was cardioverted during that hospitalization.   He says that he is feeling pretty good at this time.  No palpitations, he is in NSR today.  No chest pain, he does get dyspnea walking up hills or stairs.  Does ok on flat ground.  No orthopnea/PND.  No lightheadedness.   ECG: NSR, LAFB, PAC, nonspecific T wave changes, prolonged QTc 550 msec (bradycardic with HR 50)  Labs (6/21): creatinine 1.48 Labs (9/21): creatinine 3.1 Labs (3/22): K 3, creatinine 1.14  PMH: 1. Cardiomyopathy: Presumed nonischemic  - Echo (2012) with EF 30-35% - Echo (6/17) with EF 15-20% - Echo (9/21) with EF < 15%, moderate-severe MR, moderate RV dilation with moderately decreased RV systolic function, PASP 56 mmHg.  - Hives with Entresto 2. H/o  NSVT: On amiodarone, no ICD placed per notes due to poor compliance.  3. Mitral regurgitation: Moderate-severe on 9/21 echo, suspect functional.  4. HTN 5. Hyperlipidemia 6. Type 2 diabetes 7. CKD: Creatinine has fluctuated markedly over the last year, suspect CKD stage 3.  8. Gout 9. Atrial fibrillation: Paroxysmal.  DCCV in 12/19 and again in 9/21 during hospitalization at [facility].   Social History   Socioeconomic History  . Marital status: Single    Spouse name: Not on file  . Number of children: 2  . Years of education: Not on file  . Highest education level: Not on file  Occupational History  . Occupation: Maintenance department    Comment: [employer]  Tobacco Use  . Smoking status: Never Smoker  . Smokeless tobacco: Never Used  Vaping Use  . Vaping Use: Never used  Substance and Sexual Activity  . Alcohol use: Not Currently    Alcohol/week: 20.0 standard drinks    Types: 10 Cans of beer, 10 Standard drinks or equivalent per week    Comment: stopped 2019  . Drug use: No  . Sexual activity: Yes  Other Topics Concern  . Not on file  Social History Narrative  . Not on file   Social Determinants of Health   Financial Resource Strain: Not on file  Food Insecurity: Not on file  Transportation Needs: Not on file  Physical Activity: Not on file  Stress: Not on file  Social Connections: Not on file  Intimate Partner Violence:  Not on file   Family History  Problem Relation Age of Onset  . Dementia Mother   . COPD Father   . Diabetes Father   . Coronary artery disease Father    ROS: All systems reviewed and negative except as per HPI.   Current Outpatient Medications  Medication Sig Dispense Refill  . allopurinol (ZYLOPRIM) 100 MG tablet TAKE 1/2 (ONE-HALF) TABLET BY MOUTH ONCE DAILY FOR GOUT 30 tablet 0  . amiodarone (PACERONE) 200 MG tablet Take 1 tablet (200 mg total) by mouth daily. 90 tablet 3  . apixaban (ELIQUIS) 5 MG TABS tablet Take 1 tablet (5 mg total) by mouth  2 (two) times daily. 180 tablet 1  . atorvastatin (LIPITOR) 40 MG tablet Take 1 tablet (40 mg total) by mouth daily. 90 tablet 1  . calcitRIOL (ROCALTROL) 0.25 MCG capsule .25 mcg 3 times a week. Monday , Wednesday, and Friday 45 capsule 1  . isosorbide mononitrate (IMDUR) 30 MG 24 hr tablet Take 1 tablet (30 mg total) by mouth daily. 90 tablet 3  . metoprolol succinate (TOPROL-XL) 25 MG 24 hr tablet TAKE 1 Tablet twice a day 180 tablet 1  . tamsulosin (FLOMAX) 0.4 MG CAPS capsule Take 1 capsule (0.4 mg total) by mouth daily. 90 capsule 1  . dapagliflozin propanediol (FARXIGA) 10 MG TABS tablet Take 1 tablet (10 mg total) by mouth daily before breakfast. 90 tablet 3  . hydrALAZINE (APRESOLINE) 25 MG tablet Take 1 tablet (25 mg total) by mouth 3 (three) times daily. 90 tablet 3   No current facility-administered medications for this encounter.   BP (!) 150/72   Pulse (!) 50   Wt 88.2 kg (194 lb 6.4 oz)   SpO2 96%   BMI 29.56 kg/m  General: NAD Neck: JVP 8 cm, no thyromegaly or thyroid nodule.  Lungs: Clear to auscultation bilaterally with normal respiratory effort. CV: Nondisplaced PMI.  Heart regular S1/S2, no S3/S4, 1/6 HSM apex.  1+ ankle edema.  No carotid bruit.  Normal pedal pulses.  Abdomen: Soft, nontender, no hepatosplenomegaly, no distention.  Skin: Intact without lesions or rashes.  Neurologic: Alert and oriented x 3.  Psych: Normal affect. Extremities: No clubbing or cyanosis.  HEENT: Normal.   Assessment/Plan: 1. Chronic systolic CHF: Long-standing cardiomyopathy of uncertain etiology.  Most recent echo in 9/21 showed EF < 15% with moderate-severe MR and moderate RV dysfunction.  This echo was apparently done in the setting of atrial fibrillation with RVR (at [facility]).  As his cardiomyopathy pre-dates identification of atrial fibrillation, unlikely to have a tachy-mediated cardiomyopathy.  On exam today, patient is likely mildly volume overloaded. NYHA class II symptoms.  - Need  to investigate etiology of CHF.  We discussed left/right heart catheterization to rule out CAD and also to assess cardiac output and filling pressures.  He agreed to procedure.  I will also order a cardiac MRI to assess for infiltrative disease.  - Increase hydralazine to 25 mg tid and add Imdur 30 mg daily.  - Continue Toprol XL 25 mg bid.  - Add dapagliflozin 10 mg daily.  This should give him some gentle diuresis.  BMET today and 10 days.  - He never had an ICD placed apparently due to noncompliance.  With narrow QRS, he would not be a CRT candidate.  Would like to do the above workup and then think about ICD need.  - He had hives with Entresto use, so suspect he will not be able to take  ACEI or ARNI.  ARB will likely continue to be a future option.  2. Atrial fibrillation: Paroxysmal. He is in NSR today.  - Continue apixaban 5 mg bid.  - Continue amiodarone 200 mg daily, will check LFTs and TSH today. Needs regular eye exam.  3. H/o NSVT: See above discussion about ICD.  He is on amiodarone.  4. Mitral regurgitation: Mod-severe on echo.  Suspect functional.  For now, manage medically.  5. CKD: Patient's most recent creatinine was 1.14 (was 3.1 back in 9/21).  Will get BMET today.   Loralie Champagne 05/29/2020  After the above plans were made, patient called back and cancelled followup, cath and cardiac MRI.  He gave no reason.  We will not schedule followup for him unless we hear otherwise from him.   Loralie Champagne 05/30/2020

## 2020-06-07 ENCOUNTER — Ambulatory Visit (HOSPITAL_COMMUNITY): Admit: 2020-06-07 | Payer: Medicare HMO | Admitting: Cardiology

## 2020-06-07 ENCOUNTER — Encounter (HOSPITAL_COMMUNITY): Payer: Self-pay

## 2020-06-07 DIAGNOSIS — M1A00X0 Idiopathic chronic gout, unspecified site, without tophus (tophi): Secondary | ICD-10-CM | POA: Diagnosis not present

## 2020-06-07 DIAGNOSIS — Z0189 Encounter for other specified special examinations: Secondary | ICD-10-CM | POA: Diagnosis not present

## 2020-06-07 DIAGNOSIS — I10 Essential (primary) hypertension: Secondary | ICD-10-CM | POA: Diagnosis not present

## 2020-06-07 DIAGNOSIS — N401 Enlarged prostate with lower urinary tract symptoms: Secondary | ICD-10-CM | POA: Diagnosis not present

## 2020-06-07 DIAGNOSIS — E782 Mixed hyperlipidemia: Secondary | ICD-10-CM | POA: Diagnosis not present

## 2020-06-07 SURGERY — RIGHT/LEFT HEART CATH AND CORONARY ANGIOGRAPHY
Anesthesia: LOCAL

## 2020-06-12 DIAGNOSIS — D291 Benign neoplasm of prostate: Secondary | ICD-10-CM | POA: Diagnosis not present

## 2020-06-12 DIAGNOSIS — E782 Mixed hyperlipidemia: Secondary | ICD-10-CM | POA: Diagnosis not present

## 2020-06-12 DIAGNOSIS — D696 Thrombocytopenia, unspecified: Secondary | ICD-10-CM | POA: Diagnosis not present

## 2020-06-12 DIAGNOSIS — I10 Essential (primary) hypertension: Secondary | ICD-10-CM | POA: Diagnosis not present

## 2020-06-12 DIAGNOSIS — R7401 Elevation of levels of liver transaminase levels: Secondary | ICD-10-CM | POA: Diagnosis not present

## 2020-06-12 DIAGNOSIS — M109 Gout, unspecified: Secondary | ICD-10-CM | POA: Diagnosis not present

## 2020-07-03 ENCOUNTER — Encounter (HOSPITAL_COMMUNITY): Payer: Medicare HMO | Admitting: Cardiology

## 2020-07-31 ENCOUNTER — Encounter (HOSPITAL_COMMUNITY): Payer: Medicare HMO | Admitting: Cardiology

## 2020-08-06 ENCOUNTER — Ambulatory Visit (INDEPENDENT_AMBULATORY_CARE_PROVIDER_SITE_OTHER): Payer: Medicare HMO | Admitting: Gastroenterology

## 2020-08-06 ENCOUNTER — Encounter (INDEPENDENT_AMBULATORY_CARE_PROVIDER_SITE_OTHER): Payer: Self-pay | Admitting: Gastroenterology

## 2020-09-06 ENCOUNTER — Other Ambulatory Visit (HOSPITAL_COMMUNITY): Payer: Self-pay | Admitting: Cardiology

## 2020-09-07 DIAGNOSIS — I10 Essential (primary) hypertension: Secondary | ICD-10-CM | POA: Diagnosis not present

## 2020-09-07 DIAGNOSIS — R7301 Impaired fasting glucose: Secondary | ICD-10-CM | POA: Diagnosis not present

## 2020-09-14 ENCOUNTER — Other Ambulatory Visit (HOSPITAL_COMMUNITY): Payer: Self-pay | Admitting: Family Medicine

## 2020-09-14 ENCOUNTER — Other Ambulatory Visit (HOSPITAL_BASED_OUTPATIENT_CLINIC_OR_DEPARTMENT_OTHER): Payer: Self-pay | Admitting: Family Medicine

## 2020-09-14 DIAGNOSIS — R945 Abnormal results of liver function studies: Secondary | ICD-10-CM | POA: Diagnosis not present

## 2020-09-14 DIAGNOSIS — E876 Hypokalemia: Secondary | ICD-10-CM | POA: Diagnosis not present

## 2020-09-14 DIAGNOSIS — R7989 Other specified abnormal findings of blood chemistry: Secondary | ICD-10-CM

## 2020-09-14 DIAGNOSIS — R809 Proteinuria, unspecified: Secondary | ICD-10-CM | POA: Diagnosis not present

## 2020-09-14 DIAGNOSIS — I10 Essential (primary) hypertension: Secondary | ICD-10-CM | POA: Diagnosis not present

## 2020-09-14 DIAGNOSIS — R7301 Impaired fasting glucose: Secondary | ICD-10-CM | POA: Insufficient documentation

## 2020-09-14 DIAGNOSIS — R7303 Prediabetes: Secondary | ICD-10-CM | POA: Diagnosis not present

## 2020-09-14 DIAGNOSIS — D696 Thrombocytopenia, unspecified: Secondary | ICD-10-CM | POA: Diagnosis not present

## 2020-09-14 DIAGNOSIS — M109 Gout, unspecified: Secondary | ICD-10-CM | POA: Diagnosis not present

## 2020-09-14 DIAGNOSIS — E782 Mixed hyperlipidemia: Secondary | ICD-10-CM | POA: Diagnosis not present

## 2021-03-01 ENCOUNTER — Other Ambulatory Visit (HOSPITAL_COMMUNITY): Payer: Self-pay | Admitting: Family Medicine

## 2021-03-01 ENCOUNTER — Other Ambulatory Visit: Payer: Self-pay | Admitting: Family Medicine

## 2021-03-01 DIAGNOSIS — R7989 Other specified abnormal findings of blood chemistry: Secondary | ICD-10-CM

## 2021-03-11 ENCOUNTER — Ambulatory Visit (HOSPITAL_COMMUNITY): Payer: Medicare Other

## 2021-03-11 ENCOUNTER — Encounter (HOSPITAL_COMMUNITY): Payer: Self-pay

## 2021-03-18 DIAGNOSIS — N1832 Chronic kidney disease, stage 3b: Secondary | ICD-10-CM

## 2021-03-26 DIAGNOSIS — M25462 Effusion, left knee: Secondary | ICD-10-CM | POA: Insufficient documentation

## 2021-03-26 DIAGNOSIS — I5022 Chronic systolic (congestive) heart failure: Secondary | ICD-10-CM | POA: Insufficient documentation

## 2021-03-26 DIAGNOSIS — R600 Localized edema: Secondary | ICD-10-CM | POA: Insufficient documentation

## 2021-03-26 DIAGNOSIS — R54 Age-related physical debility: Secondary | ICD-10-CM | POA: Insufficient documentation

## 2021-04-02 IMAGING — DX DG WRIST COMPLETE 3+V*R*
4 series · 4 of 4 positions shown · non-contrast
Comparison: None.

CLINICAL DATA: Right hand pain and swelling for several days, no
known injury, initial encounter

EXAM:
RIGHT WRIST - COMPLETE 3+ VIEW

[wrist pa]
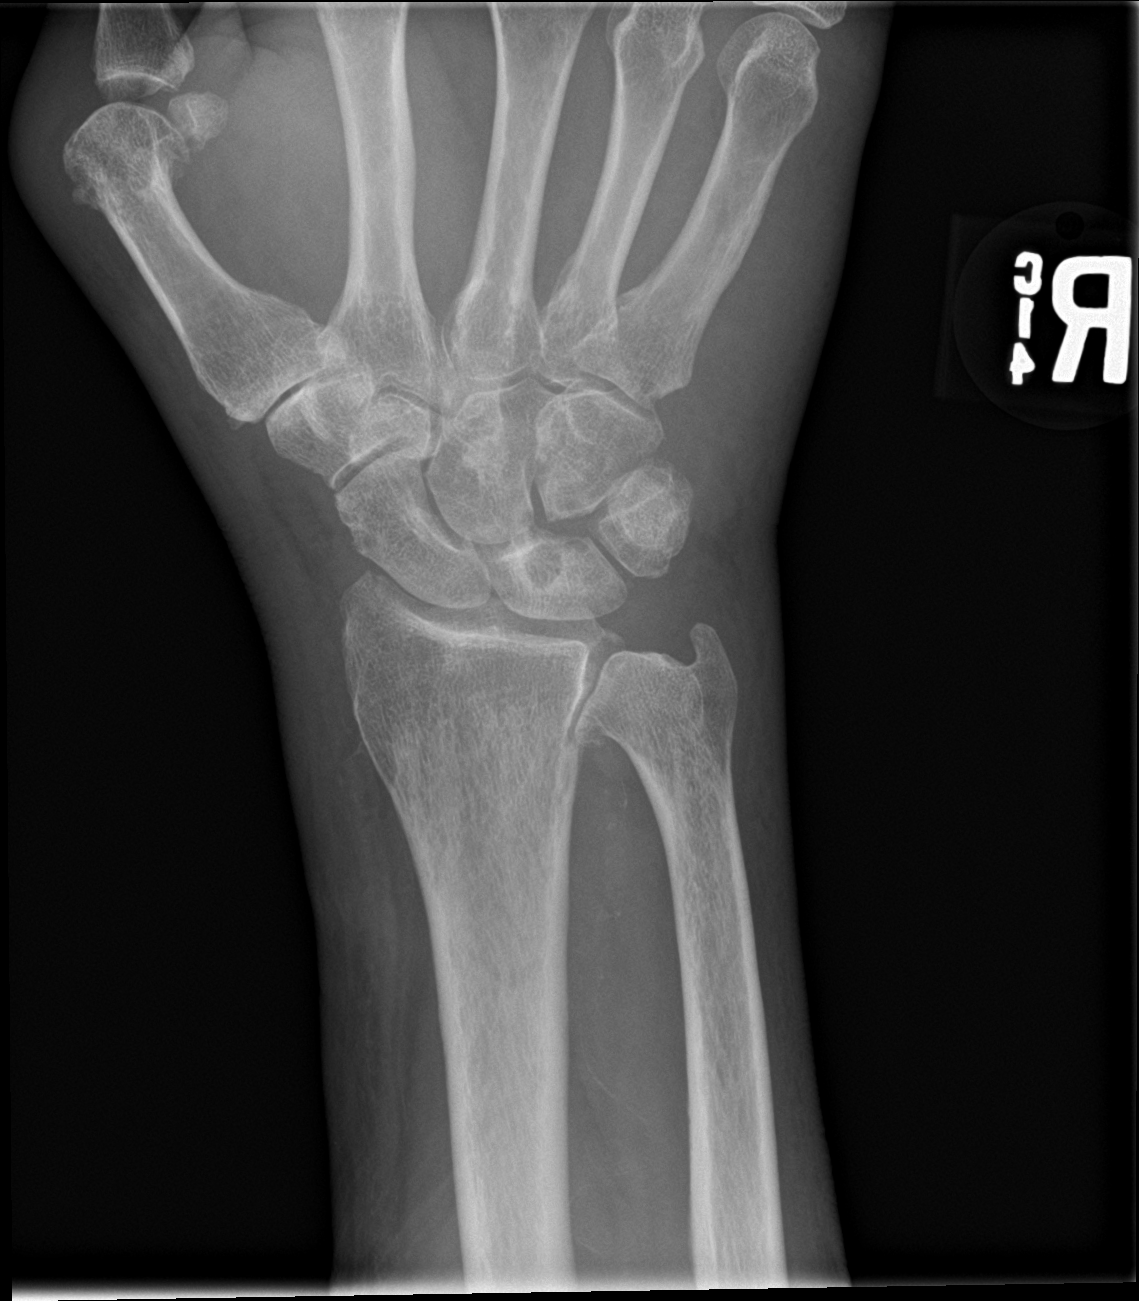

[wrist obl]
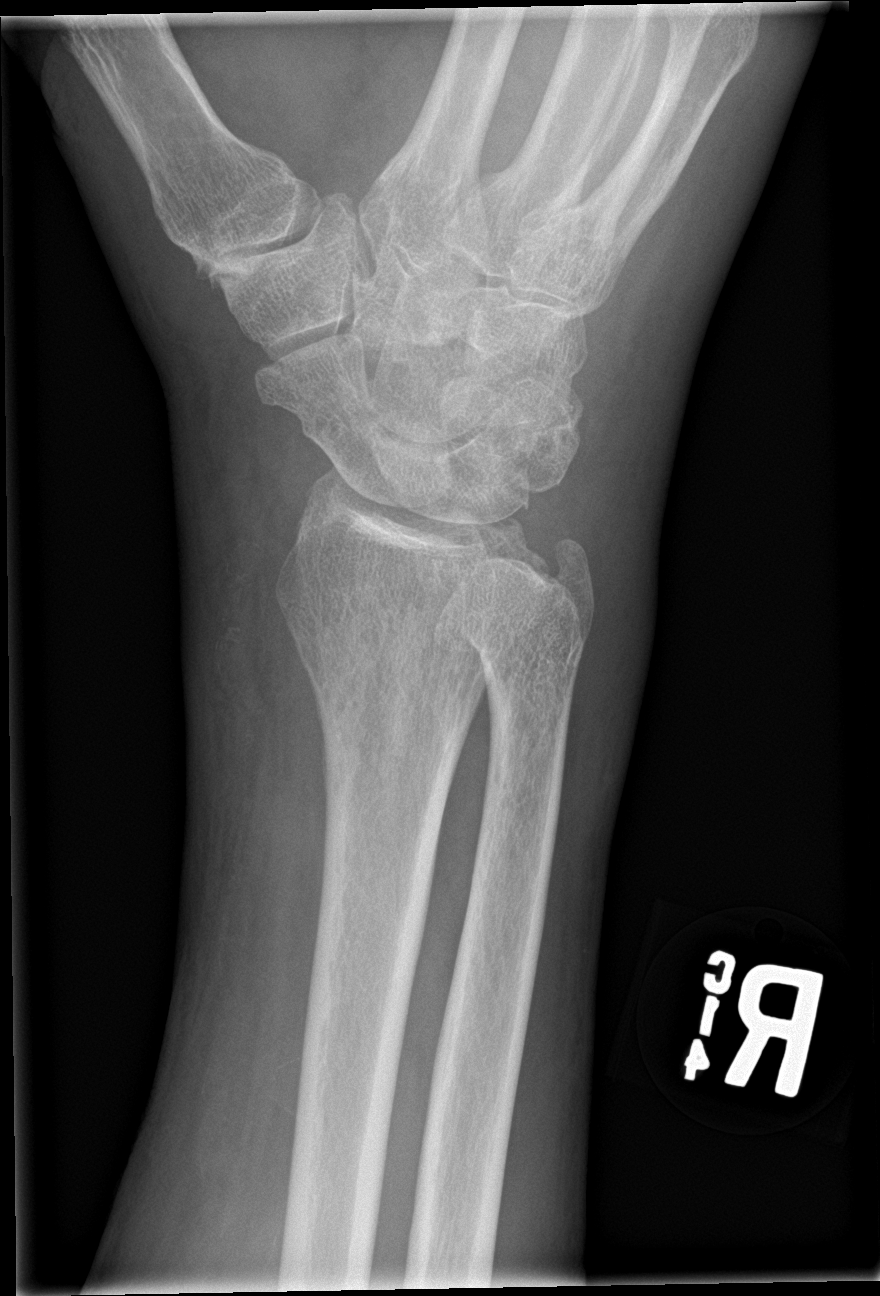

[wrist lat]
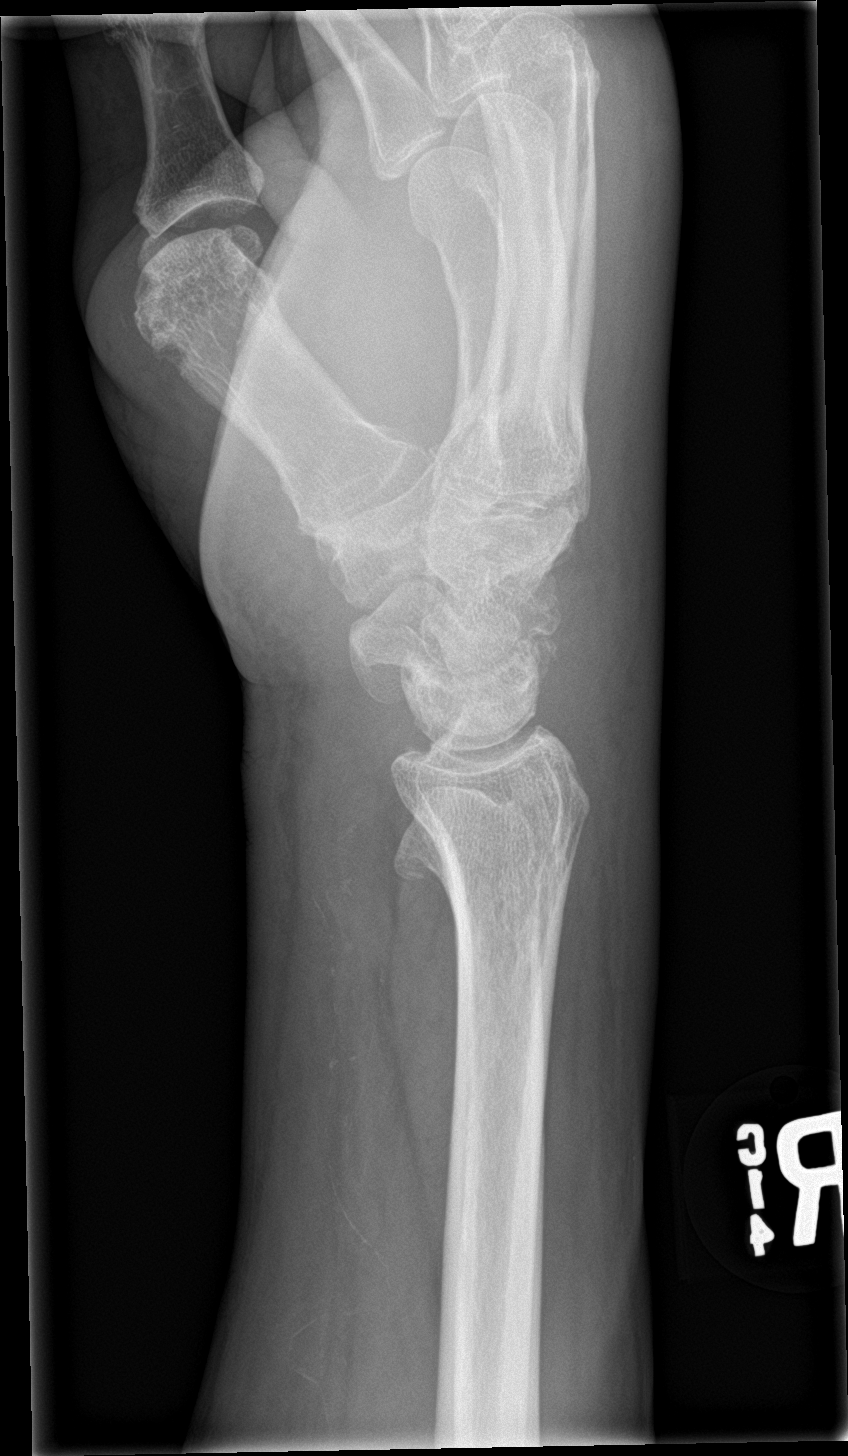

[wrist navicular]
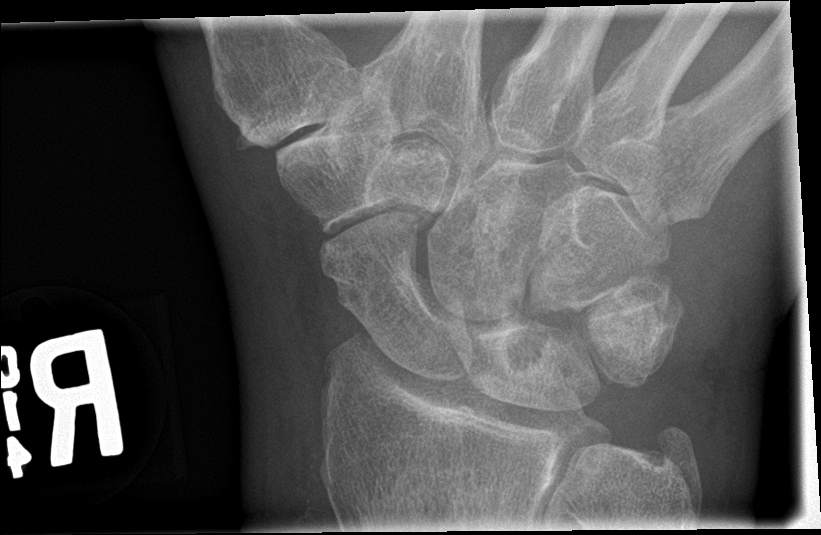

[4 of 4 positions shown; findings below may reference images not displayed]

FINDINGS: Mild degenerative changes are noted in the distal radioulnar joint.
No acute fracture or dislocation is seen. Cystic changes in the
lunate bone are noted consistent with degenerative change. No
erosive changes are seen. No soft tissue abnormality is noted.
IMPRESSION: Degenerative changes primarily within the lunate bone. No erosive
changes are seen.
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
## 2021-04-10 ENCOUNTER — Inpatient Hospital Stay (HOSPITAL_COMMUNITY): Payer: Medicare Other | Attending: Hematology | Admitting: Hematology

## 2021-04-10 ENCOUNTER — Encounter (HOSPITAL_COMMUNITY): Payer: Self-pay | Admitting: Hematology

## 2021-04-10 ENCOUNTER — Other Ambulatory Visit: Payer: Self-pay

## 2021-04-10 ENCOUNTER — Inpatient Hospital Stay (HOSPITAL_COMMUNITY): Payer: Medicare Other

## 2021-04-10 VITALS — BP 131/104 | HR 66 | Temp 97.7°F | Resp 18 | Ht 68.0 in | Wt 209.8 lb

## 2021-04-10 DIAGNOSIS — I130 Hypertensive heart and chronic kidney disease with heart failure and stage 1 through stage 4 chronic kidney disease, or unspecified chronic kidney disease: Secondary | ICD-10-CM | POA: Diagnosis not present

## 2021-04-10 DIAGNOSIS — N1832 Chronic kidney disease, stage 3b: Secondary | ICD-10-CM | POA: Insufficient documentation

## 2021-04-10 DIAGNOSIS — E1122 Type 2 diabetes mellitus with diabetic chronic kidney disease: Secondary | ICD-10-CM | POA: Insufficient documentation

## 2021-04-10 DIAGNOSIS — I5023 Acute on chronic systolic (congestive) heart failure: Secondary | ICD-10-CM | POA: Diagnosis not present

## 2021-04-10 DIAGNOSIS — D696 Thrombocytopenia, unspecified: Secondary | ICD-10-CM | POA: Insufficient documentation

## 2021-04-10 DIAGNOSIS — D72810 Lymphocytopenia: Secondary | ICD-10-CM | POA: Insufficient documentation

## 2021-04-10 LAB — CBC WITH DIFFERENTIAL/PLATELET
Abs Immature Granulocytes: 0.04 10*3/uL (ref 0.00–0.07)
Basophils Absolute: 0 10*3/uL (ref 0.0–0.1)
Basophils Relative: 1 %
Eosinophils Absolute: 0.1 10*3/uL (ref 0.0–0.5)
Eosinophils Relative: 2 %
HCT: 43.2 % (ref 39.0–52.0)
Hemoglobin: 13.8 g/dL (ref 13.0–17.0)
Immature Granulocytes: 1 %
Lymphocytes Relative: 7 %
Lymphs Abs: 0.5 10*3/uL — ABNORMAL LOW (ref 0.7–4.0)
MCH: 28.2 pg (ref 26.0–34.0)
MCHC: 31.9 g/dL (ref 30.0–36.0)
MCV: 88.3 fL (ref 80.0–100.0)
Monocytes Absolute: 0.4 10*3/uL (ref 0.1–1.0)
Monocytes Relative: 6 %
Neutro Abs: 5.9 10*3/uL (ref 1.7–7.7)
Neutrophils Relative %: 83 %
Platelets: 194 10*3/uL (ref 150–400)
RBC: 4.89 MIL/uL (ref 4.22–5.81)
RDW: 14.7 % (ref 11.5–15.5)
WBC: 7 10*3/uL (ref 4.0–10.5)
nRBC: 0 % (ref 0.0–0.2)

## 2021-04-10 LAB — LACTATE DEHYDROGENASE: LDH: 219 U/L — ABNORMAL HIGH (ref 98–192)

## 2021-04-10 LAB — HEPATITIS B SURFACE ANTIBODY,QUALITATIVE: Hep B S Ab: REACTIVE — AB

## 2021-04-10 LAB — VITAMIN B12: Vitamin B-12: 1450 pg/mL — ABNORMAL HIGH (ref 180–914)

## 2021-04-10 LAB — RETICULOCYTES
Immature Retic Fract: 21.1 % — ABNORMAL HIGH (ref 2.3–15.9)
RBC.: 5.01 MIL/uL (ref 4.22–5.81)
Retic Count, Absolute: 55.6 10*3/uL (ref 19.0–186.0)
Retic Ct Pct: 1.1 % (ref 0.4–3.1)

## 2021-04-10 LAB — HEPATITIS B CORE ANTIBODY, TOTAL: Hep B Core Total Ab: NONREACTIVE

## 2021-04-10 LAB — HIV ANTIBODY (ROUTINE TESTING W REFLEX): HIV Screen 4th Generation wRfx: NONREACTIVE

## 2021-04-10 LAB — HEPATITIS B SURFACE ANTIGEN: Hepatitis B Surface Ag: NONREACTIVE

## 2021-04-10 LAB — FOLATE: Folate: 13.8 ng/mL (ref >5.9)

## 2021-04-10 NOTE — Patient Instructions (Signed)
[place] [facility] ?Discharge Instructions ? ?You were seen today by Dr. Delton Coombes & Tarri Abernethy PA-C for your low platelets. ? ?We will check labs today to see if we can find out why your platelets are low. ? ?We will also check an ultrasound of your abdomen to see if you have any liver disease that is causing low platelets. ? ?We will see you for follow-up visit in 1 month to discuss these results. ? ? ?Thank you for choosing [place] [facility] to provide your oncology and hematology care.  To afford each patient quality time with our provider, please arrive at least 15 minutes before your scheduled appointment time.  ? ?If you have a lab appointment with the [facility] please come in thru the Main Entrance and check in at the main information desk. ? ?You need to re-schedule your appointment should you arrive 10 or more minutes late.  We strive to give you quality time with our providers, and arriving late affects you and other patients whose appointments are after yours.  Also, if you no show three or more times for appointments you may be dismissed from the clinic at the providers discretion.     ?Again, thank you for choosing [facility].  Our hope is that these requests will decrease the amount of time that you wait before being seen by our physicians.       ?_____________________________________________________________ ? ?Should you have questions after your visit to [facility], please contact our office at [phone] and follow the prompts.  Our office hours are 8:00 a.m. and 4:30 p.m. Monday - Friday.  Please note that voicemails left after 4:00 p.m. may not be returned until the following business day.  We are closed weekends and major holidays.  You do have access to a nurse 24-7, just call the main number to the clinic [phone] and do not press any options, hold on the line and a nurse will answer  the phone.   ? ?For prescription refill requests, have your pharmacy contact our office and allow 72 hours.   ? ?Due to Covid, you will need to wear a mask upon entering the hospital. If you do not have a mask, a mask will be given to you at the Main Entrance upon arrival. For doctor visits, patients may have 1 support person age 11 or older with them. For treatment visits, patients can not have anyone with them due to social distancing guidelines and our immunocompromised population.  ? ? ? ?

## 2021-04-11 LAB — KAPPA/LAMBDA LIGHT CHAINS
Kappa free light chain: 39.5 mg/L — ABNORMAL HIGH (ref 3.3–19.4)
Kappa, lambda light chain ratio: 1.45 (ref 0.26–1.65)
Lambda free light chains: 27.2 mg/L — ABNORMAL HIGH (ref 5.7–26.3)

## 2021-04-11 LAB — HCV AB W REFLEX TO QUANT PCR: HCV Ab: NONREACTIVE

## 2021-04-11 LAB — HCV INTERPRETATION

## 2021-04-11 LAB — ANA: Anti Nuclear Antibody (ANA): POSITIVE — AB

## 2021-04-11 LAB — RHEUMATOID FACTOR: Rheumatoid fact SerPl-aCnc: 32.7 IU/mL — ABNORMAL HIGH (ref <14.0)

## 2021-04-12 LAB — PROTEIN ELECTROPHORESIS, SERUM
A/G Ratio: 0.9 (ref 0.7–1.7)
Albumin ELP: 3.2 g/dL (ref 2.9–4.4)
Alpha-1-Globulin: 0.4 g/dL (ref 0.0–0.4)
Alpha-2-Globulin: 0.9 g/dL (ref 0.4–1.0)
Beta Globulin: 1 g/dL (ref 0.7–1.3)
Gamma Globulin: 1.3 g/dL (ref 0.4–1.8)
Globulin, Total: 3.5 g/dL (ref 2.2–3.9)
Total Protein ELP: 6.7 g/dL (ref 6.0–8.5)

## 2021-04-12 LAB — H PYLORI, IGM, IGG, IGA AB
H Pylori IgG: 0.14 Index Value (ref 0.00–0.79)
H. Pylogi, Iga Abs: 17.7 units — ABNORMAL HIGH (ref 0.0–8.9)
H. Pylogi, Igm Abs: <9 units (ref 0.0–8.9)

## 2021-04-12 LAB — COPPER, SERUM: Copper: 95 ug/dL (ref 69–132)

## 2021-04-12 LAB — IMMUNOFIXATION ELECTROPHORESIS
IgA: 377 mg/dL (ref 61–437)
IgG (Immunoglobin G), Serum: 1162 mg/dL (ref 603–1613)
IgM (Immunoglobulin M), Srm: 233 mg/dL — ABNORMAL HIGH (ref 15–143)
Total Protein ELP: 6.9 g/dL (ref 6.0–8.5)

## 2021-04-14 LAB — METHYLMALONIC ACID, SERUM: Methylmalonic Acid, Quantitative: 186 nmol/L (ref 0–378)

## 2021-04-17 ENCOUNTER — Ambulatory Visit (HOSPITAL_COMMUNITY): Admission: RE | Admit: 2021-04-17 | Payer: Medicare Other | Source: Ambulatory Visit

## 2021-05-09 ENCOUNTER — Ambulatory Visit (HOSPITAL_COMMUNITY): Payer: Medicare Other | Admitting: Physician Assistant
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
## 2021-05-13 ENCOUNTER — Inpatient Hospital Stay (HOSPITAL_COMMUNITY): Payer: Medicare Other | Attending: Hematology | Admitting: Physician Assistant

## 2021-05-13 VITALS — BP 139/98 | HR 87 | Temp 97.6°F | Resp 18 | Ht 68.11 in | Wt 201.3 lb

## 2021-05-13 DIAGNOSIS — I4891 Unspecified atrial fibrillation: Secondary | ICD-10-CM | POA: Insufficient documentation

## 2021-05-13 DIAGNOSIS — R161 Splenomegaly, not elsewhere classified: Secondary | ICD-10-CM | POA: Diagnosis not present

## 2021-05-13 DIAGNOSIS — R7303 Prediabetes: Secondary | ICD-10-CM | POA: Diagnosis not present

## 2021-05-13 DIAGNOSIS — I10 Essential (primary) hypertension: Secondary | ICD-10-CM | POA: Diagnosis not present

## 2021-05-13 DIAGNOSIS — R760 Raised antibody titer: Secondary | ICD-10-CM | POA: Diagnosis not present

## 2021-05-13 DIAGNOSIS — D696 Thrombocytopenia, unspecified: Secondary | ICD-10-CM | POA: Diagnosis not present

## 2021-05-13 DIAGNOSIS — K760 Fatty (change of) liver, not elsewhere classified: Secondary | ICD-10-CM | POA: Diagnosis not present

## 2021-05-13 DIAGNOSIS — Z7901 Long term (current) use of anticoagulants: Secondary | ICD-10-CM | POA: Diagnosis not present

## 2021-05-13 DIAGNOSIS — N1832 Chronic kidney disease, stage 3b: Secondary | ICD-10-CM | POA: Diagnosis not present

## 2021-05-13 NOTE — Patient Instructions (Signed)
[place] at [facility] ?Discharge Instructions ? ?You were seen today by Tarri Abernethy PA-C for your low platelets.   ? ?FOLLOW-UP APPOINTMENT: Labs and follow-up visit in 6 onths ? ? ?Thank you for choosing [place] at [facility] to provide your oncology and hematology care.  To afford each patient quality time with our provider, please arrive at least 15 minutes before your scheduled appointment time.  ? ?If you have a lab appointment with the [facility] please come in thru the Main Entrance and check in at the main information desk. ? ?You need to re-schedule your appointment should you arrive 10 or more minutes late.  We strive to give you quality time with our providers, and arriving late affects you and other patients whose appointments are after yours.  Also, if you no show three or more times for appointments you may be dismissed from the clinic at the providers discretion.     ?Again, thank you for choosing [facility].  Our hope is that these requests will decrease the amount of time that you wait before being seen by our physicians.       ?_____________________________________________________________ ? ?Should you have questions after your visit to [facility], please contact our office at [phone] and follow the prompts.  Our office hours are 8:00 a.m. and 4:30 p.m. Monday - Friday.  Please note that voicemails left after 4:00 p.m. may not be returned until the following business day.  We are closed weekends and major holidays.  You do have access to a nurse 24-7, just call the main number to the clinic [phone] and do not press any options, hold on the line and a nurse will answer the phone.   ? ?For prescription refill requests, have your pharmacy contact our office and allow 72 hours.   ? ?Due to Covid, you will need to wear a mask upon entering the hospital. If you do not have a mask, a mask will be given  to you at the Main Entrance upon arrival. For doctor visits, patients may have 1 support person age 48 or older with them. For treatment visits, patients can not have anyone with them due to social distancing guidelines and our immunocompromised population.  ? ? ? ?

## 2021-06-20 DIAGNOSIS — I10 Essential (primary) hypertension: Secondary | ICD-10-CM | POA: Diagnosis not present

## 2021-06-20 DIAGNOSIS — E782 Mixed hyperlipidemia: Secondary | ICD-10-CM | POA: Diagnosis not present

## 2021-06-20 DIAGNOSIS — R7303 Prediabetes: Secondary | ICD-10-CM | POA: Diagnosis not present

## 2021-06-26 DIAGNOSIS — E876 Hypokalemia: Secondary | ICD-10-CM | POA: Diagnosis not present

## 2021-06-26 DIAGNOSIS — R7303 Prediabetes: Secondary | ICD-10-CM | POA: Diagnosis not present

## 2021-06-26 DIAGNOSIS — E782 Mixed hyperlipidemia: Secondary | ICD-10-CM | POA: Diagnosis not present

## 2021-06-26 DIAGNOSIS — N1832 Chronic kidney disease, stage 3b: Secondary | ICD-10-CM | POA: Diagnosis not present

## 2021-06-26 DIAGNOSIS — I10 Essential (primary) hypertension: Secondary | ICD-10-CM | POA: Diagnosis not present

## 2021-06-26 DIAGNOSIS — D696 Thrombocytopenia, unspecified: Secondary | ICD-10-CM | POA: Diagnosis not present

## 2021-06-26 DIAGNOSIS — R809 Proteinuria, unspecified: Secondary | ICD-10-CM | POA: Diagnosis not present

## 2021-06-26 DIAGNOSIS — G4700 Insomnia, unspecified: Secondary | ICD-10-CM | POA: Insufficient documentation

## 2021-06-26 DIAGNOSIS — M109 Gout, unspecified: Secondary | ICD-10-CM | POA: Diagnosis not present

## 2021-06-26 DIAGNOSIS — R945 Abnormal results of liver function studies: Secondary | ICD-10-CM | POA: Diagnosis not present

## 2021-06-26 DIAGNOSIS — I4891 Unspecified atrial fibrillation: Secondary | ICD-10-CM | POA: Diagnosis not present

## 2021-06-26 DIAGNOSIS — R7301 Impaired fasting glucose: Secondary | ICD-10-CM | POA: Diagnosis not present

## 2021-06-26 DIAGNOSIS — I429 Cardiomyopathy, unspecified: Secondary | ICD-10-CM | POA: Diagnosis not present

## 2021-07-04 DIAGNOSIS — E1122 Type 2 diabetes mellitus with diabetic chronic kidney disease: Secondary | ICD-10-CM | POA: Diagnosis not present

## 2021-07-04 DIAGNOSIS — I129 Hypertensive chronic kidney disease with stage 1 through stage 4 chronic kidney disease, or unspecified chronic kidney disease: Secondary | ICD-10-CM | POA: Diagnosis not present

## 2021-07-04 DIAGNOSIS — I5022 Chronic systolic (congestive) heart failure: Secondary | ICD-10-CM | POA: Diagnosis not present

## 2021-07-04 DIAGNOSIS — N189 Chronic kidney disease, unspecified: Secondary | ICD-10-CM | POA: Diagnosis not present

## 2021-07-04 DIAGNOSIS — I480 Paroxysmal atrial fibrillation: Secondary | ICD-10-CM | POA: Diagnosis not present

## 2021-07-10 DIAGNOSIS — I10 Essential (primary) hypertension: Secondary | ICD-10-CM | POA: Diagnosis not present

## 2021-07-15 DIAGNOSIS — I129 Hypertensive chronic kidney disease with stage 1 through stage 4 chronic kidney disease, or unspecified chronic kidney disease: Secondary | ICD-10-CM | POA: Diagnosis not present

## 2021-07-15 DIAGNOSIS — E1122 Type 2 diabetes mellitus with diabetic chronic kidney disease: Secondary | ICD-10-CM | POA: Diagnosis not present

## 2021-07-15 DIAGNOSIS — E211 Secondary hyperparathyroidism, not elsewhere classified: Secondary | ICD-10-CM | POA: Diagnosis not present

## 2021-07-15 DIAGNOSIS — I5022 Chronic systolic (congestive) heart failure: Secondary | ICD-10-CM | POA: Diagnosis not present

## 2021-07-15 DIAGNOSIS — R809 Proteinuria, unspecified: Secondary | ICD-10-CM | POA: Diagnosis not present

## 2021-07-15 DIAGNOSIS — E875 Hyperkalemia: Secondary | ICD-10-CM | POA: Diagnosis not present

## 2021-07-15 DIAGNOSIS — E1129 Type 2 diabetes mellitus with other diabetic kidney complication: Secondary | ICD-10-CM | POA: Diagnosis not present

## 2021-07-15 DIAGNOSIS — N189 Chronic kidney disease, unspecified: Secondary | ICD-10-CM | POA: Diagnosis not present

## 2021-08-06 DIAGNOSIS — I10 Essential (primary) hypertension: Secondary | ICD-10-CM | POA: Diagnosis not present

## 2021-08-06 DIAGNOSIS — E876 Hypokalemia: Secondary | ICD-10-CM | POA: Diagnosis not present

## 2021-10-23 DIAGNOSIS — M109 Gout, unspecified: Secondary | ICD-10-CM | POA: Diagnosis not present

## 2021-10-23 DIAGNOSIS — I10 Essential (primary) hypertension: Secondary | ICD-10-CM | POA: Diagnosis not present

## 2021-10-23 DIAGNOSIS — E782 Mixed hyperlipidemia: Secondary | ICD-10-CM | POA: Diagnosis not present

## 2021-10-23 DIAGNOSIS — D696 Thrombocytopenia, unspecified: Secondary | ICD-10-CM | POA: Diagnosis not present

## 2021-10-23 DIAGNOSIS — R7303 Prediabetes: Secondary | ICD-10-CM | POA: Diagnosis not present

## 2021-11-14 ENCOUNTER — Other Ambulatory Visit: Payer: Medicare Other

## 2021-11-14 ENCOUNTER — Ambulatory Visit: Payer: Medicare Other | Admitting: Physician Assistant

## 2021-11-20 DIAGNOSIS — E782 Mixed hyperlipidemia: Secondary | ICD-10-CM | POA: Diagnosis not present

## 2021-11-20 DIAGNOSIS — R945 Abnormal results of liver function studies: Secondary | ICD-10-CM | POA: Diagnosis not present

## 2021-11-20 DIAGNOSIS — R7303 Prediabetes: Secondary | ICD-10-CM | POA: Diagnosis not present

## 2021-11-20 DIAGNOSIS — Z0001 Encounter for general adult medical examination with abnormal findings: Secondary | ICD-10-CM | POA: Diagnosis not present

## 2021-11-20 DIAGNOSIS — I10 Essential (primary) hypertension: Secondary | ICD-10-CM | POA: Diagnosis not present

## 2021-11-20 DIAGNOSIS — Z23 Encounter for immunization: Secondary | ICD-10-CM | POA: Diagnosis not present

## 2021-11-20 DIAGNOSIS — I4891 Unspecified atrial fibrillation: Secondary | ICD-10-CM | POA: Diagnosis not present

## 2021-11-20 DIAGNOSIS — R7301 Impaired fasting glucose: Secondary | ICD-10-CM | POA: Diagnosis not present

## 2021-11-20 DIAGNOSIS — E876 Hypokalemia: Secondary | ICD-10-CM | POA: Diagnosis not present

## 2021-11-20 DIAGNOSIS — R809 Proteinuria, unspecified: Secondary | ICD-10-CM | POA: Diagnosis not present

## 2021-11-20 DIAGNOSIS — D696 Thrombocytopenia, unspecified: Secondary | ICD-10-CM | POA: Diagnosis not present

## 2021-11-20 DIAGNOSIS — M109 Gout, unspecified: Secondary | ICD-10-CM | POA: Diagnosis not present

## 2021-12-13 NOTE — Progress Notes (Deleted)
RESCHEDULED

## 2021-12-16 ENCOUNTER — Inpatient Hospital Stay: Payer: Medicare Other

## 2021-12-16 ENCOUNTER — Inpatient Hospital Stay: Payer: Medicare Other | Admitting: Physician Assistant
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
## 2021-12-24 ENCOUNTER — Inpatient Hospital Stay (HOSPITAL_BASED_OUTPATIENT_CLINIC_OR_DEPARTMENT_OTHER): Payer: Medicare Other | Admitting: Physician Assistant

## 2021-12-24 ENCOUNTER — Inpatient Hospital Stay: Payer: Medicare Other | Attending: Hematology

## 2021-12-24 ENCOUNTER — Encounter: Payer: Self-pay | Admitting: Physician Assistant

## 2021-12-24 VITALS — BP 140/94 | HR 64 | Temp 98.3°F | Resp 20 | Ht 68.7 in | Wt 209.9 lb

## 2021-12-24 DIAGNOSIS — D696 Thrombocytopenia, unspecified: Secondary | ICD-10-CM

## 2021-12-24 LAB — CBC WITH DIFFERENTIAL/PLATELET
Abs Immature Granulocytes: 0.01 10*3/uL (ref 0.00–0.07)
Basophils Absolute: 0 10*3/uL (ref 0.0–0.1)
Basophils Relative: 1 %
Eosinophils Absolute: 0.1 10*3/uL (ref 0.0–0.5)
Eosinophils Relative: 3 %
HCT: 45.6 % (ref 39.0–52.0)
Hemoglobin: 14.9 g/dL (ref 13.0–17.0)
Immature Granulocytes: 0 %
Lymphocytes Relative: 12 %
Lymphs Abs: 0.7 10*3/uL (ref 0.7–4.0)
MCH: 28.8 pg (ref 26.0–34.0)
MCHC: 32.7 g/dL (ref 30.0–36.0)
MCV: 88 fL (ref 80.0–100.0)
Monocytes Absolute: 0.4 10*3/uL (ref 0.1–1.0)
Monocytes Relative: 7 %
Neutro Abs: 4.4 10*3/uL (ref 1.7–7.7)
Neutrophils Relative %: 77 %
Platelets: 119 10*3/uL — ABNORMAL LOW (ref 150–400)
RBC: 5.18 MIL/uL (ref 4.22–5.81)
RDW: 16 % — ABNORMAL HIGH (ref 11.5–15.5)
WBC: 5.6 10*3/uL (ref 4.0–10.5)
nRBC: 0 % (ref 0.0–0.2)

## 2021-12-24 NOTE — Patient Instructions (Addendum)
[place] at [facility] Discharge Instructions  You were seen today by Tarri Abernethy PA-C for your low platelets.  Your platelets are mildly low.  This may be related to immune system dysfunction, or from possible liver disease.  **We need to check ULTRASOUND of your ABDOMEN to evaluate your liver and spleen. **We will call you to discuss these results.  FOLLOW-UP APPOINTMENT: Labs and follow-up visit in 6 months  ** Thank you for trusting me with your healthcare!  I strive to provide all of my patients with quality care at each visit.  If you receive a survey for this visit, I would be so grateful to you for taking the time to provide feedback.  Thank you in advance!  ~ Jorge Retz                   Dr. Derek Jack   &   Tarri Abernethy, PA-C   - - - - - - - - - - - - - - - - - -     Thank you for choosing [place] at [facility] to provide your oncology and hematology care.  To afford each patient quality time with our provider, please arrive at least 15 minutes before your scheduled appointment time.   If you have a lab appointment with the [facility] please come in thru the Main Entrance and check in at the main information desk.  You need to re-schedule your appointment should you arrive 10 or more minutes late.  We strive to give you quality time with our providers, and arriving late affects you and other patients whose appointments are after yours.  Also, if you no show three or more times for appointments you may be dismissed from the clinic at the providers discretion.     Again, thank you for choosing [facility].  Our hope is that these requests will decrease the amount of time that you wait before being seen by our physicians.       _____________________________________________________________  Should you have questions after your visit to [facility], please contact our office at [phone] and follow the prompts.  Our office hours are 8:00 a.m. and 4:30 p.m. Monday - Friday.  Please note that voicemails left after 4:00 p.m. may not be returned until the following business day.  We are closed weekends and major holidays.  You do have access to a nurse 24-7, just call the main number to the clinic [phone] and do not press any options, hold on the line and a nurse will answer the phone.    For prescription refill requests, have your pharmacy contact our office and allow 72 hours.    Due to Covid, you will need to wear a mask upon entering the hospital. If you do not have a mask, a mask will be given to you at the Main Entrance upon arrival. For doctor visits, patients may have 1 support person age 38 or older with them. For treatment visits, patients can not have anyone with them due to social distancing guidelines and our immunocompromised population.

## 2021-12-25 ENCOUNTER — Other Ambulatory Visit: Payer: Self-pay

## 2021-12-25 DIAGNOSIS — D696 Thrombocytopenia, unspecified: Secondary | ICD-10-CM

## 2022-01-07 ENCOUNTER — Ambulatory Visit (HOSPITAL_COMMUNITY): Admission: RE | Admit: 2022-01-07 | Payer: Medicare Other | Source: Ambulatory Visit

## 2022-03-06 ENCOUNTER — Encounter (HOSPITAL_COMMUNITY): Payer: Self-pay | Admitting: *Deleted

## 2022-03-18 DIAGNOSIS — R7303 Prediabetes: Secondary | ICD-10-CM | POA: Diagnosis not present

## 2022-03-18 DIAGNOSIS — E782 Mixed hyperlipidemia: Secondary | ICD-10-CM | POA: Diagnosis not present

## 2022-03-18 DIAGNOSIS — I10 Essential (primary) hypertension: Secondary | ICD-10-CM | POA: Diagnosis not present

## 2022-03-18 DIAGNOSIS — D696 Thrombocytopenia, unspecified: Secondary | ICD-10-CM | POA: Diagnosis not present

## 2022-04-02 ENCOUNTER — Other Ambulatory Visit (HOSPITAL_COMMUNITY): Payer: Self-pay | Admitting: Nurse Practitioner

## 2022-04-02 DIAGNOSIS — Z0000 Encounter for general adult medical examination without abnormal findings: Secondary | ICD-10-CM | POA: Diagnosis not present

## 2022-04-02 DIAGNOSIS — R945 Abnormal results of liver function studies: Secondary | ICD-10-CM | POA: Diagnosis not present

## 2022-04-02 DIAGNOSIS — I10 Essential (primary) hypertension: Secondary | ICD-10-CM | POA: Diagnosis not present

## 2022-04-02 DIAGNOSIS — R7303 Prediabetes: Secondary | ICD-10-CM | POA: Diagnosis not present

## 2022-04-02 DIAGNOSIS — M109 Gout, unspecified: Secondary | ICD-10-CM | POA: Diagnosis not present

## 2022-04-02 DIAGNOSIS — R809 Proteinuria, unspecified: Secondary | ICD-10-CM | POA: Diagnosis not present

## 2022-04-02 DIAGNOSIS — R7301 Impaired fasting glucose: Secondary | ICD-10-CM | POA: Diagnosis not present

## 2022-04-02 DIAGNOSIS — I4891 Unspecified atrial fibrillation: Secondary | ICD-10-CM | POA: Diagnosis not present

## 2022-04-02 DIAGNOSIS — I129 Hypertensive chronic kidney disease with stage 1 through stage 4 chronic kidney disease, or unspecified chronic kidney disease: Secondary | ICD-10-CM | POA: Diagnosis not present

## 2022-04-02 DIAGNOSIS — D696 Thrombocytopenia, unspecified: Secondary | ICD-10-CM | POA: Diagnosis not present

## 2022-04-02 DIAGNOSIS — E782 Mixed hyperlipidemia: Secondary | ICD-10-CM | POA: Diagnosis not present

## 2022-04-02 DIAGNOSIS — E876 Hypokalemia: Secondary | ICD-10-CM | POA: Diagnosis not present

## 2022-04-04 DIAGNOSIS — N170 Acute kidney failure with tubular necrosis: Secondary | ICD-10-CM | POA: Diagnosis not present

## 2022-04-04 DIAGNOSIS — N189 Chronic kidney disease, unspecified: Secondary | ICD-10-CM | POA: Diagnosis not present

## 2022-04-04 DIAGNOSIS — E1122 Type 2 diabetes mellitus with diabetic chronic kidney disease: Secondary | ICD-10-CM | POA: Diagnosis not present

## 2022-04-04 DIAGNOSIS — I5022 Chronic systolic (congestive) heart failure: Secondary | ICD-10-CM | POA: Diagnosis not present

## 2022-04-04 DIAGNOSIS — I129 Hypertensive chronic kidney disease with stage 1 through stage 4 chronic kidney disease, or unspecified chronic kidney disease: Secondary | ICD-10-CM | POA: Diagnosis not present

## 2022-04-04 DIAGNOSIS — D696 Thrombocytopenia, unspecified: Secondary | ICD-10-CM | POA: Diagnosis not present

## 2022-04-04 DIAGNOSIS — E1129 Type 2 diabetes mellitus with other diabetic kidney complication: Secondary | ICD-10-CM | POA: Diagnosis not present

## 2022-04-04 DIAGNOSIS — R809 Proteinuria, unspecified: Secondary | ICD-10-CM | POA: Diagnosis not present

## 2022-04-18 ENCOUNTER — Inpatient Hospital Stay (HOSPITAL_COMMUNITY)
Admission: EM | Admit: 2022-04-18 | Discharge: 2022-04-21 | DRG: 291 | Disposition: A | Discharge status: Home / Self Care | Payer: 59 | Attending: Internal Medicine | Admitting: Internal Medicine

## 2022-04-18 ENCOUNTER — Other Ambulatory Visit: Payer: Self-pay

## 2022-04-18 ENCOUNTER — Emergency Department (HOSPITAL_COMMUNITY): Payer: 59

## 2022-04-18 ENCOUNTER — Encounter (HOSPITAL_COMMUNITY): Payer: Self-pay | Admitting: Pharmacy Technician

## 2022-04-18 DIAGNOSIS — Z888 Allergy status to other drugs, medicaments and biological substances status: Secondary | ICD-10-CM | POA: Diagnosis not present

## 2022-04-18 DIAGNOSIS — Z825 Family history of asthma and other chronic lower respiratory diseases: Secondary | ICD-10-CM

## 2022-04-18 DIAGNOSIS — R600 Localized edema: Secondary | ICD-10-CM | POA: Diagnosis present

## 2022-04-18 DIAGNOSIS — E785 Hyperlipidemia, unspecified: Secondary | ICD-10-CM | POA: Diagnosis not present

## 2022-04-18 DIAGNOSIS — N1832 Chronic kidney disease, stage 3b: Secondary | ICD-10-CM | POA: Diagnosis present

## 2022-04-18 DIAGNOSIS — I5082 Biventricular heart failure: Secondary | ICD-10-CM | POA: Diagnosis present

## 2022-04-18 DIAGNOSIS — I10 Essential (primary) hypertension: Secondary | ICD-10-CM | POA: Diagnosis present

## 2022-04-18 DIAGNOSIS — E1121 Type 2 diabetes mellitus with diabetic nephropathy: Secondary | ICD-10-CM | POA: Diagnosis not present

## 2022-04-18 DIAGNOSIS — I480 Paroxysmal atrial fibrillation: Secondary | ICD-10-CM | POA: Diagnosis not present

## 2022-04-18 DIAGNOSIS — I5023 Acute on chronic systolic (congestive) heart failure: Secondary | ICD-10-CM | POA: Diagnosis present

## 2022-04-18 DIAGNOSIS — I428 Other cardiomyopathies: Secondary | ICD-10-CM | POA: Diagnosis not present

## 2022-04-18 DIAGNOSIS — N170 Acute kidney failure with tubular necrosis: Secondary | ICD-10-CM | POA: Diagnosis not present

## 2022-04-18 DIAGNOSIS — Z820 Family history of epilepsy and other diseases of the nervous system: Secondary | ICD-10-CM

## 2022-04-18 DIAGNOSIS — Z7984 Long term (current) use of oral hypoglycemic drugs: Secondary | ICD-10-CM | POA: Diagnosis not present

## 2022-04-18 DIAGNOSIS — Z7901 Long term (current) use of anticoagulants: Secondary | ICD-10-CM | POA: Diagnosis not present

## 2022-04-18 DIAGNOSIS — Z8249 Family history of ischemic heart disease and other diseases of the circulatory system: Secondary | ICD-10-CM | POA: Diagnosis not present

## 2022-04-18 DIAGNOSIS — I5020 Unspecified systolic (congestive) heart failure: Secondary | ICD-10-CM | POA: Diagnosis not present

## 2022-04-18 DIAGNOSIS — I130 Hypertensive heart and chronic kidney disease with heart failure and stage 1 through stage 4 chronic kidney disease, or unspecified chronic kidney disease: Secondary | ICD-10-CM | POA: Diagnosis not present

## 2022-04-18 DIAGNOSIS — I083 Combined rheumatic disorders of mitral, aortic and tricuspid valves: Secondary | ICD-10-CM | POA: Diagnosis present

## 2022-04-18 DIAGNOSIS — K219 Gastro-esophageal reflux disease without esophagitis: Secondary | ICD-10-CM | POA: Diagnosis present

## 2022-04-18 DIAGNOSIS — I4891 Unspecified atrial fibrillation: Secondary | ICD-10-CM | POA: Diagnosis present

## 2022-04-18 DIAGNOSIS — Z833 Family history of diabetes mellitus: Secondary | ICD-10-CM | POA: Diagnosis not present

## 2022-04-18 DIAGNOSIS — N183 Chronic kidney disease, stage 3 unspecified: Secondary | ICD-10-CM | POA: Diagnosis present

## 2022-04-18 DIAGNOSIS — I509 Heart failure, unspecified: Secondary | ICD-10-CM | POA: Diagnosis not present

## 2022-04-18 DIAGNOSIS — E1122 Type 2 diabetes mellitus with diabetic chronic kidney disease: Secondary | ICD-10-CM | POA: Diagnosis present

## 2022-04-18 DIAGNOSIS — Z79899 Other long term (current) drug therapy: Secondary | ICD-10-CM

## 2022-04-18 DIAGNOSIS — I472 Ventricular tachycardia, unspecified: Secondary | ICD-10-CM | POA: Diagnosis not present

## 2022-04-18 DIAGNOSIS — I110 Hypertensive heart disease with heart failure: Secondary | ICD-10-CM | POA: Diagnosis not present

## 2022-04-18 DIAGNOSIS — R0602 Shortness of breath: Secondary | ICD-10-CM | POA: Diagnosis not present

## 2022-04-18 DIAGNOSIS — D696 Thrombocytopenia, unspecified: Secondary | ICD-10-CM | POA: Diagnosis not present

## 2022-04-18 DIAGNOSIS — I4819 Other persistent atrial fibrillation: Secondary | ICD-10-CM | POA: Diagnosis present

## 2022-04-18 LAB — GLUCOSE, CAPILLARY
Glucose-Capillary: 138 mg/dL — ABNORMAL HIGH (ref 70–99)
Glucose-Capillary: 95 mg/dL (ref 70–99)

## 2022-04-18 LAB — BASIC METABOLIC PANEL WITH GFR
Anion gap: 10 (ref 5–15)
BUN: 38 mg/dL — ABNORMAL HIGH (ref 8–23)
CO2: 24 mmol/L (ref 22–32)
Calcium: 8.7 mg/dL — ABNORMAL LOW (ref 8.9–10.3)
Chloride: 103 mmol/L (ref 98–111)
Creatinine, Ser: 2.03 mg/dL — ABNORMAL HIGH (ref 0.61–1.24)
GFR, Estimated: 34 mL/min — ABNORMAL LOW
Glucose, Bld: 112 mg/dL — ABNORMAL HIGH (ref 70–99)
Potassium: 4.1 mmol/L (ref 3.5–5.1)
Sodium: 137 mmol/L (ref 135–145)

## 2022-04-18 LAB — CBC
HCT: 40 % (ref 39.0–52.0)
Hemoglobin: 12.7 g/dL — ABNORMAL LOW (ref 13.0–17.0)
MCH: 27.8 pg (ref 26.0–34.0)
MCHC: 31.8 g/dL (ref 30.0–36.0)
MCV: 87.5 fL (ref 80.0–100.0)
Platelets: 113 10*3/uL — ABNORMAL LOW (ref 150–400)
RBC: 4.57 MIL/uL (ref 4.22–5.81)
RDW: 15.6 % — ABNORMAL HIGH (ref 11.5–15.5)
WBC: 6 10*3/uL (ref 4.0–10.5)
nRBC: 0 % (ref 0.0–0.2)

## 2022-04-18 LAB — BRAIN NATRIURETIC PEPTIDE: B Natriuretic Peptide: 2367 pg/mL — ABNORMAL HIGH (ref 0.0–100.0)

## 2022-04-18 LAB — TROPONIN I (HIGH SENSITIVITY)
Troponin I (High Sensitivity): 28 ng/L — ABNORMAL HIGH (ref <18)
Troponin I (High Sensitivity): 28 ng/L — ABNORMAL HIGH (ref <18)

## 2022-04-18 LAB — HEMOGLOBIN A1C
Hgb A1c MFr Bld: 6.5 % — ABNORMAL HIGH (ref 4.8–5.6)
Mean Plasma Glucose: 140 mg/dL

## 2022-04-18 MED ORDER — ACETAMINOPHEN 650 MG RE SUPP: 650.0000 mg | Freq: Four times a day (QID) | RECTAL | Status: DC | PRN

## 2022-04-18 MED ORDER — ACETAMINOPHEN 325 MG PO TABS: 650.0000 mg | ORAL_TABLET | Freq: Four times a day (QID) | ORAL | Status: DC | PRN

## 2022-04-18 MED ORDER — FUROSEMIDE 10 MG/ML IJ SOLN
40.0000 mg | Freq: Two times a day (BID) | INTRAMUSCULAR | Status: DC
  Administered 2022-04-18 – 2022-04-21 (×6): 40 mg via INTRAVENOUS
  Filled 2022-04-18 (×6): qty 4

## 2022-04-18 MED ORDER — METOPROLOL SUCCINATE ER 25 MG PO TB24
25.0000 mg | ORAL_TABLET | Freq: Two times a day (BID) | ORAL | Status: DC
  Administered 2022-04-18 – 2022-04-21 (×6): 25 mg via ORAL
  Filled 2022-04-18 (×6): qty 1

## 2022-04-18 MED ORDER — APIXABAN 5 MG PO TABS
5.0000 mg | ORAL_TABLET | Freq: Two times a day (BID) | ORAL | Status: DC
  Administered 2022-04-18 – 2022-04-21 (×6): 5 mg via ORAL
  Filled 2022-04-18 (×6): qty 1

## 2022-04-18 MED ORDER — AMIODARONE HCL 200 MG PO TABS: 200.0000 mg | ORAL_TABLET | Freq: Two times a day (BID) | ORAL | Status: DC

## 2022-04-18 MED ORDER — FUROSEMIDE 10 MG/ML IJ SOLN
40.0000 mg | Freq: Once | INTRAMUSCULAR | Status: AC
  Administered 2022-04-18: 40 mg via INTRAVENOUS
  Filled 2022-04-18: qty 4

## 2022-04-18 MED ORDER — INSULIN ASPART 100 UNIT/ML IJ SOLN
0.0000 [IU] | Freq: Three times a day (TID) | INTRAMUSCULAR | Status: DC
  Administered 2022-04-18: 1 [IU] via SUBCUTANEOUS
  Administered 2022-04-20: 2 [IU] via SUBCUTANEOUS
  Administered 2022-04-21: 1 [IU] via SUBCUTANEOUS

## 2022-04-18 MED ORDER — DAPAGLIFLOZIN PROPANEDIOL 10 MG PO TABS
10.0000 mg | ORAL_TABLET | Freq: Every day | ORAL | Status: DC
  Administered 2022-04-19 – 2022-04-21 (×3): 10 mg via ORAL
  Filled 2022-04-18 (×6): qty 1

## 2022-04-18 MED ORDER — HYDRALAZINE HCL 25 MG PO TABS
25.0000 mg | ORAL_TABLET | Freq: Three times a day (TID) | ORAL | Status: DC
  Administered 2022-04-18 – 2022-04-21 (×8): 25 mg via ORAL
  Filled 2022-04-18 (×9): qty 1

## 2022-04-18 MED ORDER — AMIODARONE HCL 200 MG PO TABS
200.0000 mg | ORAL_TABLET | Freq: Two times a day (BID) | ORAL | Status: DC
  Administered 2022-04-18 – 2022-04-21 (×6): 200 mg via ORAL
  Filled 2022-04-18 (×6): qty 1

## 2022-04-18 MED ORDER — TAMSULOSIN HCL 0.4 MG PO CAPS
0.4000 mg | ORAL_CAPSULE | Freq: Every day | ORAL | Status: DC
  Administered 2022-04-18 – 2022-04-21 (×4): 0.4 mg via ORAL
  Filled 2022-04-18 (×4): qty 1

## 2022-04-18 MED ORDER — INSULIN ASPART 100 UNIT/ML IJ SOLN: 0.0000 [IU] | Freq: Every day | INTRAMUSCULAR | Status: DC

## 2022-04-18 MED ORDER — ATORVASTATIN CALCIUM 10 MG PO TABS
20.0000 mg | ORAL_TABLET | Freq: Every day | ORAL | Status: DC
  Administered 2022-04-19 – 2022-04-21 (×3): 20 mg via ORAL
  Filled 2022-04-18 (×3): qty 2

## 2022-04-18 MED ORDER — SENNOSIDES-DOCUSATE SODIUM 8.6-50 MG PO TABS: 1.0000 | ORAL_TABLET | Freq: Every evening | ORAL | Status: DC | PRN

## 2022-04-18 MED ORDER — AMIODARONE HCL 200 MG PO TABS: 200.0000 mg | ORAL_TABLET | Freq: Every day | ORAL | Status: DC

## 2022-04-18 MED ORDER — ALLOPURINOL 100 MG PO TABS
50.0000 mg | ORAL_TABLET | Freq: Every day | ORAL | Status: DC
  Administered 2022-04-19 – 2022-04-21 (×3): 50 mg via ORAL
  Filled 2022-04-18 (×3): qty 1

## 2022-04-18 MED ORDER — ISOSORBIDE MONONITRATE ER 60 MG PO TB24
30.0000 mg | ORAL_TABLET | Freq: Every day | ORAL | Status: DC
  Administered 2022-04-18 – 2022-04-21 (×4): 30 mg via ORAL
  Filled 2022-04-18 (×4): qty 1
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
## 2022-04-18 NOTE — ED Triage Notes (Signed)
Pt here with shob for the last few days. Hx chf and states that he feels like he is retaining fluid. Endorses taking medication as prescribed and having good urine output.
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
## 2022-04-19 ENCOUNTER — Observation Stay (HOSPITAL_COMMUNITY): Payer: 59

## 2022-04-19 DIAGNOSIS — I472 Ventricular tachycardia, unspecified: Secondary | ICD-10-CM | POA: Diagnosis present

## 2022-04-19 DIAGNOSIS — E1122 Type 2 diabetes mellitus with diabetic chronic kidney disease: Secondary | ICD-10-CM | POA: Diagnosis present

## 2022-04-19 DIAGNOSIS — I428 Other cardiomyopathies: Secondary | ICD-10-CM | POA: Diagnosis present

## 2022-04-19 DIAGNOSIS — I5082 Biventricular heart failure: Secondary | ICD-10-CM | POA: Diagnosis present

## 2022-04-19 DIAGNOSIS — I509 Heart failure, unspecified: Secondary | ICD-10-CM | POA: Diagnosis not present

## 2022-04-19 DIAGNOSIS — E1121 Type 2 diabetes mellitus with diabetic nephropathy: Secondary | ICD-10-CM | POA: Diagnosis not present

## 2022-04-19 DIAGNOSIS — Z7984 Long term (current) use of oral hypoglycemic drugs: Secondary | ICD-10-CM | POA: Diagnosis not present

## 2022-04-19 DIAGNOSIS — I5020 Unspecified systolic (congestive) heart failure: Secondary | ICD-10-CM | POA: Diagnosis not present

## 2022-04-19 DIAGNOSIS — Z888 Allergy status to other drugs, medicaments and biological substances status: Secondary | ICD-10-CM | POA: Diagnosis not present

## 2022-04-19 DIAGNOSIS — Z820 Family history of epilepsy and other diseases of the nervous system: Secondary | ICD-10-CM | POA: Diagnosis not present

## 2022-04-19 DIAGNOSIS — Z825 Family history of asthma and other chronic lower respiratory diseases: Secondary | ICD-10-CM | POA: Diagnosis not present

## 2022-04-19 DIAGNOSIS — N1832 Chronic kidney disease, stage 3b: Secondary | ICD-10-CM | POA: Diagnosis present

## 2022-04-19 DIAGNOSIS — Z7901 Long term (current) use of anticoagulants: Secondary | ICD-10-CM | POA: Diagnosis not present

## 2022-04-19 DIAGNOSIS — E785 Hyperlipidemia, unspecified: Secondary | ICD-10-CM | POA: Diagnosis present

## 2022-04-19 DIAGNOSIS — I480 Paroxysmal atrial fibrillation: Secondary | ICD-10-CM | POA: Diagnosis present

## 2022-04-19 DIAGNOSIS — K219 Gastro-esophageal reflux disease without esophagitis: Secondary | ICD-10-CM | POA: Diagnosis present

## 2022-04-19 DIAGNOSIS — I5023 Acute on chronic systolic (congestive) heart failure: Secondary | ICD-10-CM | POA: Diagnosis not present

## 2022-04-19 DIAGNOSIS — D696 Thrombocytopenia, unspecified: Secondary | ICD-10-CM | POA: Diagnosis present

## 2022-04-19 DIAGNOSIS — Z79899 Other long term (current) drug therapy: Secondary | ICD-10-CM | POA: Diagnosis not present

## 2022-04-19 DIAGNOSIS — Z8249 Family history of ischemic heart disease and other diseases of the circulatory system: Secondary | ICD-10-CM | POA: Diagnosis not present

## 2022-04-19 DIAGNOSIS — N170 Acute kidney failure with tubular necrosis: Secondary | ICD-10-CM | POA: Diagnosis present

## 2022-04-19 DIAGNOSIS — I083 Combined rheumatic disorders of mitral, aortic and tricuspid valves: Secondary | ICD-10-CM | POA: Diagnosis present

## 2022-04-19 DIAGNOSIS — Z833 Family history of diabetes mellitus: Secondary | ICD-10-CM | POA: Diagnosis not present

## 2022-04-19 DIAGNOSIS — I130 Hypertensive heart and chronic kidney disease with heart failure and stage 1 through stage 4 chronic kidney disease, or unspecified chronic kidney disease: Secondary | ICD-10-CM | POA: Diagnosis present

## 2022-04-19 LAB — BASIC METABOLIC PANEL
Anion gap: 11 (ref 5–15)
BUN: 35 mg/dL — ABNORMAL HIGH (ref 8–23)
CO2: 25 mmol/L (ref 22–32)
Calcium: 8.3 mg/dL — ABNORMAL LOW (ref 8.9–10.3)
Chloride: 103 mmol/L (ref 98–111)
Creatinine, Ser: 2.09 mg/dL — ABNORMAL HIGH (ref 0.61–1.24)
GFR, Estimated: 32 mL/min — ABNORMAL LOW (ref >60)
Glucose, Bld: 82 mg/dL (ref 70–99)
Potassium: 3.8 mmol/L (ref 3.5–5.1)
Sodium: 139 mmol/L (ref 135–145)

## 2022-04-19 LAB — ECHOCARDIOGRAM COMPLETE
Area-P 1/2: 3.72 cm2
Est EF: 25
Height: 68 in
MV M vel: 4.52 m/s
MV Peak grad: 81.7 mmHg
Radius: 0.8 cm
S' Lateral: 5.8 cm
Weight: 3603.2 oz

## 2022-04-19 LAB — GLUCOSE, CAPILLARY
Glucose-Capillary: 99 mg/dL (ref 70–99)
Glucose-Capillary: 135 mg/dL — ABNORMAL HIGH (ref 70–99)
Glucose-Capillary: 118 mg/dL — ABNORMAL HIGH (ref 70–99)
Glucose-Capillary: 125 mg/dL — ABNORMAL HIGH (ref 70–99)

## 2022-04-19 LAB — CBC
HCT: 39.1 % (ref 39.0–52.0)
Hemoglobin: 12.4 g/dL — ABNORMAL LOW (ref 13.0–17.0)
MCH: 27.4 pg (ref 26.0–34.0)
MCHC: 31.7 g/dL (ref 30.0–36.0)
MCV: 86.3 fL (ref 80.0–100.0)
Platelets: 110 10*3/uL — ABNORMAL LOW (ref 150–400)
RBC: 4.53 MIL/uL (ref 4.22–5.81)
RDW: 15.4 % (ref 11.5–15.5)
WBC: 4.7 10*3/uL (ref 4.0–10.5)
nRBC: 0 % (ref 0.0–0.2)

## 2022-04-19 MED ORDER — PERFLUTREN LIPID MICROSPHERE
1.0000 mL | INTRAVENOUS | Status: AC | PRN
  Administered 2022-04-19: 2 mL via INTRAVENOUS

## 2022-04-19 MED ORDER — LIVING BETTER WITH HEART FAILURE BOOK: Freq: Once | Status: DC
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
## 2022-04-19 NOTE — Care Management Obs Status (Signed)
MEDICARE OBSERVATION STATUS NOTIFICATION   Patient Details  Name: OGLE RIHN MRN: [id number] Date of Birth: [birth year]   Medicare Observation Status Notification Given:   Yes    Davit Vassar Barrie Lyme, LCSW 04/19/2022, 3:48 PM

## 2022-04-19 NOTE — TOC Progression Note (Signed)
Transition of Care [facility]) - Progression Note    Patient Details  Name: Phillip Wilson MRN: [id number] Date of Birth: [birth year]  Transition of Care [facility]) CM/SW [place], LCSW Phone Number: 04/19/2022, 3:56 PM  Clinical Narrative:     SW met with Pt at bedside. Obs completed. Would like CHF education in addition/ [facility] requested.  CHF booklet ordered.         Expected Discharge Plan and Services In-house Referral: [facility]                                             Social Determinants of Health (SDOH) Interventions SDOH Screenings   Food Insecurity: No Food Insecurity (04/19/2022)  Housing: Low Risk  (04/19/2022)  Transportation Needs: No Transportation Needs (04/19/2022)  Utilities: Not At Risk (04/19/2022)  Alcohol Screen: Low Risk  (07/12/2019)  Depression (PHQ2-9): Low Risk  (07/12/2019)  Tobacco Use: Low Risk  (04/18/2022)    Readmission Risk Interventions     No data to display

## 2022-04-20 DIAGNOSIS — I509 Heart failure, unspecified: Secondary | ICD-10-CM

## 2022-04-20 LAB — GLUCOSE, CAPILLARY
Glucose-Capillary: 114 mg/dL — ABNORMAL HIGH (ref 70–99)
Glucose-Capillary: 153 mg/dL — ABNORMAL HIGH (ref 70–99)
Glucose-Capillary: 98 mg/dL (ref 70–99)
Glucose-Capillary: 113 mg/dL — ABNORMAL HIGH (ref 70–99)

## 2022-04-20 LAB — CBC
HCT: 40.6 % (ref 39.0–52.0)
Hemoglobin: 12.9 g/dL — ABNORMAL LOW (ref 13.0–17.0)
MCH: 27.4 pg (ref 26.0–34.0)
MCHC: 31.8 g/dL (ref 30.0–36.0)
MCV: 86.2 fL (ref 80.0–100.0)
Platelets: 120 10*3/uL — ABNORMAL LOW (ref 150–400)
RBC: 4.71 MIL/uL (ref 4.22–5.81)
RDW: 15.1 % (ref 11.5–15.5)
WBC: 5.2 10*3/uL (ref 4.0–10.5)
nRBC: 0 % (ref 0.0–0.2)

## 2022-04-20 LAB — BASIC METABOLIC PANEL
Anion gap: 10 (ref 5–15)
BUN: 31 mg/dL — ABNORMAL HIGH (ref 8–23)
CO2: 30 mmol/L (ref 22–32)
Calcium: 8.3 mg/dL — ABNORMAL LOW (ref 8.9–10.3)
Chloride: 98 mmol/L (ref 98–111)
Creatinine, Ser: 2.13 mg/dL — ABNORMAL HIGH (ref 0.61–1.24)
GFR, Estimated: 32 mL/min — ABNORMAL LOW (ref >60)
Glucose, Bld: 106 mg/dL — ABNORMAL HIGH (ref 70–99)
Potassium: 3.7 mmol/L (ref 3.5–5.1)
Sodium: 138 mmol/L (ref 135–145)
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
## 2022-04-21 DIAGNOSIS — I509 Heart failure, unspecified: Secondary | ICD-10-CM | POA: Diagnosis not present

## 2022-04-21 DIAGNOSIS — E1121 Type 2 diabetes mellitus with diabetic nephropathy: Secondary | ICD-10-CM | POA: Diagnosis not present

## 2022-04-21 DIAGNOSIS — I5020 Unspecified systolic (congestive) heart failure: Secondary | ICD-10-CM

## 2022-04-21 LAB — TSH: TSH: 13.376 u[IU]/mL — ABNORMAL HIGH (ref 0.350–4.500)

## 2022-04-21 LAB — HEPATIC FUNCTION PANEL
ALT: 21 U/L (ref 0–44)
AST: 48 U/L — ABNORMAL HIGH (ref 15–41)
Albumin: 3 g/dL — ABNORMAL LOW (ref 3.5–5.0)
Alkaline Phosphatase: 148 U/L — ABNORMAL HIGH (ref 38–126)
Bilirubin, Direct: 0.6 mg/dL — ABNORMAL HIGH (ref 0.0–0.2)
Indirect Bilirubin: 1.5 mg/dL — ABNORMAL HIGH (ref 0.3–0.9)
Total Bilirubin: 2.1 mg/dL — ABNORMAL HIGH (ref 0.3–1.2)
Total Protein: 5.9 g/dL — ABNORMAL LOW (ref 6.5–8.1)

## 2022-04-21 LAB — GLUCOSE, CAPILLARY
Glucose-Capillary: 106 mg/dL — ABNORMAL HIGH (ref 70–99)
Glucose-Capillary: 139 mg/dL — ABNORMAL HIGH (ref 70–99)

## 2022-04-21 LAB — BASIC METABOLIC PANEL
Anion gap: 13 (ref 5–15)
BUN: 32 mg/dL — ABNORMAL HIGH (ref 8–23)
CO2: 28 mmol/L (ref 22–32)
Calcium: 8.2 mg/dL — ABNORMAL LOW (ref 8.9–10.3)
Chloride: 97 mmol/L — ABNORMAL LOW (ref 98–111)
Creatinine, Ser: 2.16 mg/dL — ABNORMAL HIGH (ref 0.61–1.24)
GFR, Estimated: 31 mL/min — ABNORMAL LOW (ref >60)
Glucose, Bld: 89 mg/dL (ref 70–99)
Potassium: 3.3 mmol/L — ABNORMAL LOW (ref 3.5–5.1)
Sodium: 138 mmol/L (ref 135–145)

## 2022-04-21 MED ORDER — AMIODARONE HCL 200 MG PO TABS: 200.0000 mg | ORAL_TABLET | Freq: Every day | ORAL | 3 refills | Status: DC

## 2022-04-21 MED ORDER — FUROSEMIDE 40 MG PO TABS: 40.0000 mg | ORAL_TABLET | Freq: Every day | ORAL | 0 refills | Status: DC

## 2022-04-21 MED ORDER — POTASSIUM CHLORIDE CRYS ER 10 MEQ PO TBCR: 10.0000 meq | EXTENDED_RELEASE_TABLET | Freq: Every day | ORAL | 0 refills | Status: DC

## 2022-04-21 MED ORDER — AMIODARONE HCL 200 MG PO TABS: 200.0000 mg | ORAL_TABLET | Freq: Every day | ORAL | Status: DC

## 2022-04-21 NOTE — Progress Notes (Signed)
Bedtime scheduled hydralazine and amiodarone held due to BP 114/94 and urine output of 2400 mL in 2 hour period. Urine output r/t scheduled IV lasix. Metoprolol given. Standing weight obtained at 0500.
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
## 2022-04-21 NOTE — Care Management Important Message (Signed)
Important Message  Patient Details  Name: Phillip Wilson MRN: [id number] Date of Birth: August 10, 1945   Medicare Important Message Given:  N/A - LOS <3 / Initial given by admissions     Tommy Medal 04/21/2022, 1:35 PM
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
## 2022-04-21 NOTE — Progress Notes (Signed)
Mobility Specialist Progress Note:    04/21/22 0845  Mobility  Activity Ambulated independently in hallway  Level of Assistance Independent  Assistive Device None  Distance Ambulated (ft) 200 ft  Activity Response Tolerated well  Mobility Referral Yes  $Mobility charge 1 Mobility   Pt agreeable to mobility session. Tolerated well, asx throughout. Returned pt to EOB, all needs met, call bell in reach.  Royetta Crochet Mobility Specialist Please contact via Solicitor or  Rehab office at [phone]

## 2022-04-21 NOTE — Progress Notes (Signed)
   04/21/22 0930  ReDS Vest / Clip  Station Marker D  Ruler Value 38  ReDS Value Range < 36  ReDS Actual Value 32

## 2022-04-25 ENCOUNTER — Telehealth: Payer: Self-pay | Admitting: *Deleted

## 2022-04-25 NOTE — Patient Outreach (Signed)
  Care Coordination   Initial Visit Note Post hospital call for HF admission   04/25/2022 Name: Phillip Wilson MRN: [id number] DOB: 1945/05/15  Phillip Wilson is a 76 y.o. year old male who sees [place] Crane, Edwinna Areola, MD for primary care. I spoke with  Phillip Wilson by phone today.  What matters to the patients health and wellness today?  Stay out of the hospital.    Goals Addressed             This Visit's Progress    Follow HF Action Plan to avoid complications and hospitalizations.       Interventions Today    Flowsheet Row Most Recent Value  Chronic Disease   Chronic disease during today's visit Congestive Heart Failure (CHF)  General Interventions   General Interventions Discussed/Reviewed General Interventions Discussed, General Interventions Reviewed, Doctor Visits  Doctor Visits Discussed/Reviewed Doctor Visits Discussed, PCP  Exercise Interventions   Exercise Discussed/Reviewed Weight Managment  [Weighing daily to identify fluid wt gain.]  Education Interventions   Education Provided Provided Education  Provided Verbal Education On Other  [HF Action Plan]  Nutrition Interventions   Nutrition Discussed/Reviewed Decreasing salt  Pharmacy Interventions   Pharmacy Dicussed/Reviewed Medication Adherence              SDOH assessments and interventions completed:  No  Care Coordination Interventions:  Yes, provided   Follow up plan:  Home visit scheduled for 04/30/22    Encounter Outcome:  Pt. Visit Completed   Kayleen Memos C. Myrtie Neither, MSN, [facility] Gerontological Nurse Practitioner [facility] Care Management [phone]

## 2022-04-28 DIAGNOSIS — D6869 Other thrombophilia: Secondary | ICD-10-CM | POA: Diagnosis not present

## 2022-04-28 DIAGNOSIS — Z79899 Other long term (current) drug therapy: Secondary | ICD-10-CM | POA: Diagnosis not present

## 2022-04-28 DIAGNOSIS — N1832 Chronic kidney disease, stage 3b: Secondary | ICD-10-CM | POA: Diagnosis not present

## 2022-04-28 DIAGNOSIS — I4891 Unspecified atrial fibrillation: Secondary | ICD-10-CM | POA: Diagnosis not present

## 2022-04-28 DIAGNOSIS — I10 Essential (primary) hypertension: Secondary | ICD-10-CM | POA: Diagnosis not present

## 2022-04-28 DIAGNOSIS — I509 Heart failure, unspecified: Secondary | ICD-10-CM | POA: Diagnosis not present

## 2022-04-28 DIAGNOSIS — I130 Hypertensive heart and chronic kidney disease with heart failure and stage 1 through stage 4 chronic kidney disease, or unspecified chronic kidney disease: Secondary | ICD-10-CM | POA: Diagnosis not present

## 2022-04-28 DIAGNOSIS — I429 Cardiomyopathy, unspecified: Secondary | ICD-10-CM | POA: Diagnosis not present

## 2022-04-30 DIAGNOSIS — E1122 Type 2 diabetes mellitus with diabetic chronic kidney disease: Secondary | ICD-10-CM | POA: Diagnosis not present

## 2022-04-30 DIAGNOSIS — I129 Hypertensive chronic kidney disease with stage 1 through stage 4 chronic kidney disease, or unspecified chronic kidney disease: Secondary | ICD-10-CM | POA: Diagnosis not present

## 2022-04-30 DIAGNOSIS — N189 Chronic kidney disease, unspecified: Secondary | ICD-10-CM | POA: Diagnosis not present

## 2022-04-30 DIAGNOSIS — R809 Proteinuria, unspecified: Secondary | ICD-10-CM | POA: Diagnosis not present

## 2022-04-30 DIAGNOSIS — I5022 Chronic systolic (congestive) heart failure: Secondary | ICD-10-CM | POA: Diagnosis not present

## 2022-05-02 DIAGNOSIS — N189 Chronic kidney disease, unspecified: Secondary | ICD-10-CM | POA: Diagnosis not present

## 2022-05-02 DIAGNOSIS — N170 Acute kidney failure with tubular necrosis: Secondary | ICD-10-CM | POA: Diagnosis not present

## 2022-05-02 DIAGNOSIS — E1122 Type 2 diabetes mellitus with diabetic chronic kidney disease: Secondary | ICD-10-CM | POA: Diagnosis not present

## 2022-05-02 DIAGNOSIS — I129 Hypertensive chronic kidney disease with stage 1 through stage 4 chronic kidney disease, or unspecified chronic kidney disease: Secondary | ICD-10-CM | POA: Diagnosis not present

## 2022-05-02 DIAGNOSIS — D638 Anemia in other chronic diseases classified elsewhere: Secondary | ICD-10-CM | POA: Diagnosis not present

## 2022-05-02 DIAGNOSIS — E876 Hypokalemia: Secondary | ICD-10-CM | POA: Diagnosis not present

## 2022-05-02 DIAGNOSIS — I1310 Hypertensive heart and chronic kidney disease without heart failure, with stage 1 through stage 4 chronic kidney disease, or unspecified chronic kidney disease: Secondary | ICD-10-CM | POA: Diagnosis not present

## 2022-05-02 DIAGNOSIS — I5022 Chronic systolic (congestive) heart failure: Secondary | ICD-10-CM | POA: Diagnosis not present

## 2022-05-02 DIAGNOSIS — D696 Thrombocytopenia, unspecified: Secondary | ICD-10-CM | POA: Diagnosis not present

## 2022-05-07 ENCOUNTER — Emergency Department (HOSPITAL_COMMUNITY): Payer: 59

## 2022-05-07 ENCOUNTER — Other Ambulatory Visit: Payer: Self-pay

## 2022-05-07 ENCOUNTER — Ambulatory Visit: Payer: Self-pay | Admitting: *Deleted

## 2022-05-07 ENCOUNTER — Encounter (HOSPITAL_COMMUNITY): Payer: Self-pay

## 2022-05-07 ENCOUNTER — Inpatient Hospital Stay (HOSPITAL_COMMUNITY)
Admission: EM | Admit: 2022-05-07 | Discharge: 2022-05-13 | DRG: 291 | Disposition: A | Discharge status: Home Health | Payer: 59 | Attending: Internal Medicine | Admitting: Internal Medicine

## 2022-05-07 ENCOUNTER — Ambulatory Visit (HOSPITAL_BASED_OUTPATIENT_CLINIC_OR_DEPARTMENT_OTHER)
Admit: 2022-05-07 | Discharge: 2022-05-07 | Disposition: A | Discharge status: Home / Self Care | Payer: 59 | Attending: Family Medicine | Admitting: Family Medicine

## 2022-05-07 ENCOUNTER — Encounter: Payer: Self-pay | Admitting: *Deleted

## 2022-05-07 VITALS — BP 110/84 | HR 118 | Wt 220.4 lb

## 2022-05-07 DIAGNOSIS — I081 Rheumatic disorders of both mitral and tricuspid valves: Secondary | ICD-10-CM | POA: Insufficient documentation

## 2022-05-07 DIAGNOSIS — E1122 Type 2 diabetes mellitus with diabetic chronic kidney disease: Secondary | ICD-10-CM | POA: Diagnosis not present

## 2022-05-07 DIAGNOSIS — I493 Ventricular premature depolarization: Secondary | ICD-10-CM | POA: Insufficient documentation

## 2022-05-07 DIAGNOSIS — Z888 Allergy status to other drugs, medicaments and biological substances status: Secondary | ICD-10-CM

## 2022-05-07 DIAGNOSIS — I5082 Biventricular heart failure: Secondary | ICD-10-CM | POA: Diagnosis present

## 2022-05-07 DIAGNOSIS — N1832 Chronic kidney disease, stage 3b: Secondary | ICD-10-CM

## 2022-05-07 DIAGNOSIS — I429 Cardiomyopathy, unspecified: Secondary | ICD-10-CM | POA: Insufficient documentation

## 2022-05-07 DIAGNOSIS — I4729 Other ventricular tachycardia: Secondary | ICD-10-CM

## 2022-05-07 DIAGNOSIS — Z7901 Long term (current) use of anticoagulants: Secondary | ICD-10-CM | POA: Insufficient documentation

## 2022-05-07 DIAGNOSIS — I4891 Unspecified atrial fibrillation: Secondary | ICD-10-CM

## 2022-05-07 DIAGNOSIS — D631 Anemia in chronic kidney disease: Secondary | ICD-10-CM | POA: Diagnosis not present

## 2022-05-07 DIAGNOSIS — N189 Chronic kidney disease, unspecified: Secondary | ICD-10-CM | POA: Diagnosis not present

## 2022-05-07 DIAGNOSIS — M109 Gout, unspecified: Secondary | ICD-10-CM | POA: Diagnosis not present

## 2022-05-07 DIAGNOSIS — D696 Thrombocytopenia, unspecified: Secondary | ICD-10-CM | POA: Diagnosis present

## 2022-05-07 DIAGNOSIS — R0989 Other specified symptoms and signs involving the circulatory and respiratory systems: Secondary | ICD-10-CM | POA: Diagnosis not present

## 2022-05-07 DIAGNOSIS — Z7984 Long term (current) use of oral hypoglycemic drugs: Secondary | ICD-10-CM | POA: Insufficient documentation

## 2022-05-07 DIAGNOSIS — I509 Heart failure, unspecified: Secondary | ICD-10-CM | POA: Diagnosis not present

## 2022-05-07 DIAGNOSIS — Z79899 Other long term (current) drug therapy: Secondary | ICD-10-CM | POA: Insufficient documentation

## 2022-05-07 DIAGNOSIS — R0602 Shortness of breath: Secondary | ICD-10-CM | POA: Diagnosis not present

## 2022-05-07 DIAGNOSIS — I4892 Unspecified atrial flutter: Secondary | ICD-10-CM | POA: Diagnosis not present

## 2022-05-07 DIAGNOSIS — I5023 Acute on chronic systolic (congestive) heart failure: Secondary | ICD-10-CM | POA: Diagnosis present

## 2022-05-07 DIAGNOSIS — I130 Hypertensive heart and chronic kidney disease with heart failure and stage 1 through stage 4 chronic kidney disease, or unspecified chronic kidney disease: Secondary | ICD-10-CM | POA: Diagnosis not present

## 2022-05-07 DIAGNOSIS — R413 Other amnesia: Secondary | ICD-10-CM | POA: Diagnosis present

## 2022-05-07 DIAGNOSIS — N183 Chronic kidney disease, stage 3 unspecified: Secondary | ICD-10-CM | POA: Insufficient documentation

## 2022-05-07 DIAGNOSIS — I4819 Other persistent atrial fibrillation: Secondary | ICD-10-CM

## 2022-05-07 DIAGNOSIS — E039 Hypothyroidism, unspecified: Secondary | ICD-10-CM | POA: Diagnosis not present

## 2022-05-07 DIAGNOSIS — R7989 Other specified abnormal findings of blood chemistry: Secondary | ICD-10-CM

## 2022-05-07 DIAGNOSIS — I5042 Chronic combined systolic (congestive) and diastolic (congestive) heart failure: Secondary | ICD-10-CM

## 2022-05-07 DIAGNOSIS — R031 Nonspecific low blood-pressure reading: Secondary | ICD-10-CM | POA: Diagnosis present

## 2022-05-07 DIAGNOSIS — R4189 Other symptoms and signs involving cognitive functions and awareness: Secondary | ICD-10-CM | POA: Insufficient documentation

## 2022-05-07 DIAGNOSIS — I480 Paroxysmal atrial fibrillation: Secondary | ICD-10-CM | POA: Insufficient documentation

## 2022-05-07 DIAGNOSIS — N179 Acute kidney failure, unspecified: Secondary | ICD-10-CM | POA: Diagnosis not present

## 2022-05-07 DIAGNOSIS — E785 Hyperlipidemia, unspecified: Secondary | ICD-10-CM | POA: Diagnosis present

## 2022-05-07 DIAGNOSIS — M3119 Other thrombotic microangiopathy: Secondary | ICD-10-CM

## 2022-05-07 DIAGNOSIS — Z8249 Family history of ischemic heart disease and other diseases of the circulatory system: Secondary | ICD-10-CM

## 2022-05-07 DIAGNOSIS — R946 Abnormal results of thyroid function studies: Secondary | ICD-10-CM | POA: Diagnosis not present

## 2022-05-07 DIAGNOSIS — Z825 Family history of asthma and other chronic lower respiratory diseases: Secondary | ICD-10-CM

## 2022-05-07 DIAGNOSIS — I5043 Acute on chronic combined systolic (congestive) and diastolic (congestive) heart failure: Secondary | ICD-10-CM | POA: Diagnosis not present

## 2022-05-07 DIAGNOSIS — I340 Nonrheumatic mitral (valve) insufficiency: Secondary | ICD-10-CM | POA: Diagnosis present

## 2022-05-07 DIAGNOSIS — I472 Ventricular tachycardia, unspecified: Secondary | ICD-10-CM | POA: Diagnosis not present

## 2022-05-07 DIAGNOSIS — R5383 Other fatigue: Secondary | ICD-10-CM | POA: Diagnosis present

## 2022-05-07 DIAGNOSIS — E1121 Type 2 diabetes mellitus with diabetic nephropathy: Secondary | ICD-10-CM | POA: Diagnosis present

## 2022-05-07 DIAGNOSIS — I110 Hypertensive heart disease with heart failure: Secondary | ICD-10-CM | POA: Diagnosis not present

## 2022-05-07 DIAGNOSIS — Y92239 Unspecified place in hospital as the place of occurrence of the external cause: Secondary | ICD-10-CM | POA: Diagnosis not present

## 2022-05-07 DIAGNOSIS — Z833 Family history of diabetes mellitus: Secondary | ICD-10-CM

## 2022-05-07 DIAGNOSIS — T502X5A Adverse effect of carbonic-anhydrase inhibitors, benzothiadiazides and other diuretics, initial encounter: Secondary | ICD-10-CM | POA: Diagnosis not present

## 2022-05-07 LAB — CBC WITH DIFFERENTIAL/PLATELET
Abs Immature Granulocytes: 0.01 10*3/uL (ref 0.00–0.07)
Basophils Absolute: 0 10*3/uL (ref 0.0–0.1)
Basophils Relative: 1 %
Eosinophils Absolute: 0.1 10*3/uL (ref 0.0–0.5)
Eosinophils Relative: 2 %
HCT: 39.3 % (ref 39.0–52.0)
Hemoglobin: 12.5 g/dL — ABNORMAL LOW (ref 13.0–17.0)
Immature Granulocytes: 0 %
Lymphocytes Relative: 11 %
Lymphs Abs: 0.6 10*3/uL — ABNORMAL LOW (ref 0.7–4.0)
MCH: 26.9 pg (ref 26.0–34.0)
MCHC: 31.8 g/dL (ref 30.0–36.0)
MCV: 84.5 fL (ref 80.0–100.0)
Monocytes Absolute: 0.5 10*3/uL (ref 0.1–1.0)
Monocytes Relative: 9 %
Neutro Abs: 4 10*3/uL (ref 1.7–7.7)
Neutrophils Relative %: 77 %
Platelets: 102 10*3/uL — ABNORMAL LOW (ref 150–400)
RBC: 4.65 MIL/uL (ref 4.22–5.81)
RDW: 16.9 % — ABNORMAL HIGH (ref 11.5–15.5)
WBC: 5.2 10*3/uL (ref 4.0–10.5)
nRBC: 0 % (ref 0.0–0.2)

## 2022-05-07 LAB — COMPREHENSIVE METABOLIC PANEL
ALT: 19 U/L (ref 0–44)
AST: 42 U/L — ABNORMAL HIGH (ref 15–41)
Albumin: 3.3 g/dL — ABNORMAL LOW (ref 3.5–5.0)
Alkaline Phosphatase: 192 U/L — ABNORMAL HIGH (ref 38–126)
Anion gap: 13 (ref 5–15)
BUN: 29 mg/dL — ABNORMAL HIGH (ref 8–23)
CO2: 30 mmol/L (ref 22–32)
Calcium: 9.2 mg/dL (ref 8.9–10.3)
Chloride: 97 mmol/L — ABNORMAL LOW (ref 98–111)
Creatinine, Ser: 2.15 mg/dL — ABNORMAL HIGH (ref 0.61–1.24)
GFR, Estimated: 31 mL/min — ABNORMAL LOW (ref >60)
Glucose, Bld: 119 mg/dL — ABNORMAL HIGH (ref 70–99)
Potassium: 3.8 mmol/L (ref 3.5–5.1)
Sodium: 140 mmol/L (ref 135–145)
Total Bilirubin: 3.4 mg/dL — ABNORMAL HIGH (ref 0.3–1.2)
Total Protein: 6.6 g/dL (ref 6.5–8.1)

## 2022-05-07 LAB — BASIC METABOLIC PANEL
Anion gap: 16 — ABNORMAL HIGH (ref 5–15)
BUN: 29 mg/dL — ABNORMAL HIGH (ref 8–23)
CO2: 28 mmol/L (ref 22–32)
Calcium: 8.9 mg/dL (ref 8.9–10.3)
Chloride: 96 mmol/L — ABNORMAL LOW (ref 98–111)
Creatinine, Ser: 2.01 mg/dL — ABNORMAL HIGH (ref 0.61–1.24)
GFR, Estimated: 34 mL/min — ABNORMAL LOW (ref >60)
Glucose, Bld: 97 mg/dL (ref 70–99)
Potassium: 3.5 mmol/L (ref 3.5–5.1)
Sodium: 140 mmol/L (ref 135–145)

## 2022-05-07 LAB — CBC
HCT: 41.6 % (ref 39.0–52.0)
Hemoglobin: 13.1 g/dL (ref 13.0–17.0)
MCH: 26.5 pg (ref 26.0–34.0)
MCHC: 31.5 g/dL (ref 30.0–36.0)
MCV: 84.2 fL (ref 80.0–100.0)
Platelets: 107 10*3/uL — ABNORMAL LOW (ref 150–400)
RBC: 4.94 MIL/uL (ref 4.22–5.81)
RDW: 17 % — ABNORMAL HIGH (ref 11.5–15.5)
WBC: 4.6 10*3/uL (ref 4.0–10.5)
nRBC: 0 % (ref 0.0–0.2)

## 2022-05-07 LAB — MAGNESIUM: Magnesium: 2 mg/dL (ref 1.7–2.4)

## 2022-05-07 LAB — TROPONIN I (HIGH SENSITIVITY)
Troponin I (High Sensitivity): 27 ng/L — ABNORMAL HIGH (ref <18)
Troponin I (High Sensitivity): 26 ng/L — ABNORMAL HIGH (ref <18)

## 2022-05-07 LAB — HEPARIN LEVEL (UNFRACTIONATED): Heparin Unfractionated: >1.1 IU/mL — ABNORMAL HIGH (ref 0.30–0.70)

## 2022-05-07 LAB — HIV ANTIBODY (ROUTINE TESTING W REFLEX): HIV Screen 4th Generation wRfx: NONREACTIVE

## 2022-05-07 LAB — BRAIN NATRIURETIC PEPTIDE
B Natriuretic Peptide: 3943.2 pg/mL — ABNORMAL HIGH (ref 0.0–100.0)
B Natriuretic Peptide: 3386.5 pg/mL — ABNORMAL HIGH (ref 0.0–100.0)

## 2022-05-07 LAB — T4, FREE: Free T4: 1.18 ng/dL — ABNORMAL HIGH (ref 0.61–1.12)

## 2022-05-07 LAB — LACTIC ACID, PLASMA: Lactic Acid, Venous: 1.2 mmol/L (ref 0.5–1.9)

## 2022-05-07 MED ORDER — METOPROLOL SUCCINATE ER 25 MG PO TB24
25.0000 mg | ORAL_TABLET | Freq: Every day | ORAL | Status: DC
  Administered 2022-05-08 – 2022-05-13 (×6): 25 mg via ORAL
  Filled 2022-05-07 (×6): qty 1

## 2022-05-07 MED ORDER — SODIUM CHLORIDE 0.9% FLUSH
3.0000 mL | Freq: Two times a day (BID) | INTRAVENOUS | Status: DC
  Administered 2022-05-08 – 2022-05-12 (×10): 3 mL via INTRAVENOUS

## 2022-05-07 MED ORDER — AMIODARONE HCL IN DEXTROSE 360-4.14 MG/200ML-% IV SOLN: 30.0000 mg/h | INTRAVENOUS | Status: DC

## 2022-05-07 MED ORDER — ACETAMINOPHEN 325 MG PO TABS: 650.0000 mg | ORAL_TABLET | Freq: Four times a day (QID) | ORAL | Status: DC | PRN

## 2022-05-07 MED ORDER — INSULIN ASPART 100 UNIT/ML IJ SOLN
0.0000 [IU] | Freq: Three times a day (TID) | INTRAMUSCULAR | Status: DC
  Administered 2022-05-08 – 2022-05-11 (×6): 1 [IU] via SUBCUTANEOUS

## 2022-05-07 MED ORDER — FUROSEMIDE 10 MG/ML IJ SOLN
80.0000 mg | Freq: Two times a day (BID) | INTRAMUSCULAR | Status: DC
  Administered 2022-05-07 – 2022-05-09 (×5): 80 mg via INTRAVENOUS
  Filled 2022-05-07 (×5): qty 8

## 2022-05-07 MED ORDER — HEPARIN (PORCINE) 25000 UT/250ML-% IV SOLN
1400.0000 [IU]/h | INTRAVENOUS | Status: DC
  Administered 2022-05-07: 1400 [IU]/h via INTRAVENOUS
  Filled 2022-05-07: qty 250

## 2022-05-07 MED ORDER — SENNOSIDES-DOCUSATE SODIUM 8.6-50 MG PO TABS: 1.0000 | ORAL_TABLET | Freq: Every evening | ORAL | Status: DC | PRN

## 2022-05-07 MED ORDER — AMIODARONE HCL IN DEXTROSE 360-4.14 MG/200ML-% IV SOLN
60.0000 mg/h | INTRAVENOUS | Status: AC
  Administered 2022-05-07: 60 mg/h via INTRAVENOUS
  Filled 2022-05-07: qty 200

## 2022-05-07 MED ORDER — POTASSIUM CHLORIDE CRYS ER 10 MEQ PO TBCR
10.0000 meq | EXTENDED_RELEASE_TABLET | Freq: Every day | ORAL | Status: DC
  Administered 2022-05-08: 10 meq via ORAL

## 2022-05-07 MED ORDER — HYDRALAZINE HCL 25 MG PO TABS
25.0000 mg | ORAL_TABLET | Freq: Three times a day (TID) | ORAL | Status: DC
  Administered 2022-05-07: 25 mg via ORAL
  Filled 2022-05-07: qty 1

## 2022-05-07 MED ORDER — TAMSULOSIN HCL 0.4 MG PO CAPS
0.4000 mg | ORAL_CAPSULE | Freq: Every day | ORAL | Status: DC
  Administered 2022-05-08 – 2022-05-13 (×6): 0.4 mg via ORAL
  Filled 2022-05-07 (×7): qty 1

## 2022-05-07 MED ORDER — ACETAMINOPHEN 650 MG RE SUPP: 650.0000 mg | Freq: Four times a day (QID) | RECTAL | Status: DC | PRN

## 2022-05-07 MED ORDER — ISOSORBIDE MONONITRATE ER 30 MG PO TB24: 30.0000 mg | ORAL_TABLET | Freq: Every day | ORAL | Status: DC

## 2022-05-07 MED ORDER — ATORVASTATIN CALCIUM 10 MG PO TABS
20.0000 mg | ORAL_TABLET | Freq: Every day | ORAL | Status: DC
  Administered 2022-05-07 – 2022-05-12 (×6): 20 mg via ORAL
  Filled 2022-05-07 (×6): qty 2

## 2022-05-07 MED ORDER — AMIODARONE HCL IN DEXTROSE 360-4.14 MG/200ML-% IV SOLN
30.0000 mg/h | INTRAVENOUS | Status: DC
  Administered 2022-05-07 – 2022-05-12 (×12): 30 mg/h via INTRAVENOUS
  Filled 2022-05-07 (×11): qty 200
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
## 2022-05-07 NOTE — Hospital Course (Signed)
Phillip Wilson is a 77 y.o. male with medical history significant for chronic HFrEF (EF 25%), PAF on Eliquis, CKD stage IIIb, T2DM, HTN, chronic thrombocytopenia who is admitted with acute on chronic HFrEF and A-fib with RVR.

## 2022-05-07 NOTE — Assessment & Plan Note (Signed)
Mildly elevated free T4 1.18, TSH was 13.376 on 04/21/2022.  On chronic amiodarone.  Will repeat TSH before starting on levothyroxine at this may have been sick euthyroid.

## 2022-05-07 NOTE — Addendum Note (Signed)
Encounter addended by: Jacklynn Ganong, FNP on: 05/07/2022 4:53 PM  Actions taken: Clinical Note Signed

## 2022-05-07 NOTE — Assessment & Plan Note (Signed)
Well-controlled, hemoglobin A1c 6.5%.  Placed on SSI.

## 2022-05-07 NOTE — Assessment & Plan Note (Signed)
EF 25% by TTE 04/19/2022.  Grossly volume overloaded on admission.  Advanced heart failure team managing. -Started on IV Lasix 80 mg twice daily -Continued on hydralazine 25 mg TID, Imdur 30 mg qd, Toprol-XL 25 mg qd -Strict I/O's, daily weights

## 2022-05-07 NOTE — Assessment & Plan Note (Signed)
Mild and stable 

## 2022-05-07 NOTE — Patient Outreach (Signed)
  Care Coordination  [place] [facility] NP PATIENT Initial Visit Note   05/08/2022 Name: BINYAMIN KLYM MRN: [id number] DOB: 12-11-1945  [name] is a 77 y.o. year old male who sees [name], [name], MD for primary care. I visited  JERRID CABBAGE in their home today.  What matters to the patients health and wellness today?  LEARNING ABOUT THIS NEW PROBLEM AND HOW I CAN HELP MYSELF.    Goals Addressed             This Visit's Progress    Follow HF Action Plan to avoid complications and hospitalizations.       Interventions Today    Flowsheet Row Most Recent Value  Chronic Disease   Chronic disease during today's visit Congestive Heart Failure (CHF), Atrial Fibrillation (AFib)  General Interventions   General Interventions Discussed/Reviewed General Interventions Discussed, General Interventions Reviewed  Doctor Visits Discussed/Reviewed Doctor Visits Discussed, Doctor Visits Reviewed, Annual Wellness Visits, PCP, Specialist  PCP/Specialist Visits Compliance with follow-up visit  Exercise Interventions   Exercise Discussed/Reviewed Weight Managment  Weight Management Weight maintenance  Education Interventions   Education Provided Provided Education  [HF FOLDER]  Provided Verbal Education On Nutrition, Exercise, Medication  Mental Health Interventions   Mental Health Discussed/Reviewed Mental Health Reviewed  Nutrition Interventions   Nutrition Discussed/Reviewed Decreasing salt  Pharmacy Interventions   Pharmacy Dicussed/Reviewed Pharmacy Topics Discussed, Medications and their functions  Safety Interventions   Safety Discussed/Reviewed Safety Discussed, Fall Risk, Home Safety, Safety Reviewed  Advanced Directive Interventions   Advanced Directives Discussed/Reviewed Advanced Directives Discussed, Provided resource for acquiring and filling out documents                SDOH assessments and interventions completed:  Yes  SDOH Interventions Today     Flowsheet Row Most Recent Value  SDOH Interventions   Food Insecurity Interventions Intervention Not Indicated  Housing Interventions Intervention Not Indicated  Transportation Interventions Intervention Not Indicated  Utilities Interventions Intervention Not Indicated  Alcohol Usage Interventions Intervention Not Indicated (Score <7)  Financial Strain Interventions Intervention Not Indicated  Physical Activity Interventions Intervention Not Indicated  [Encouraged walking the dog every day.]  Stress Interventions Intervention Not Indicated  Social Connections Interventions Intervention Not Indicated        Care Coordination Interventions:  Yes, provided   Follow up plan:  NEW CARE MANAGER TO FOLLOW UP WITHIN THE NEXT MONTH.    Encounter Outcome:  Pt. Visit Completed   Noralyn Pick C. Burgess Estelle, MSN, [facility] Gerontological Nurse Practitioner [facility] Care Management [phone]

## 2022-05-07 NOTE — Progress Notes (Addendum)
PCP: Dr. Jeanice Lim Cardiology: [name]: Dr. Shirlee Latch  77 y.o. with history of cardiomyopathy (never had full workup of etiology), HTN, CKD, paroxysmal atrial fibrillation and NSVT was referred by Randall An, PA, for evaluation of CHF. Much of history was derived from the patient's sister.  The patient was not sure what medications he was taking but his sister had the bottles.  He has had a cardiomyopathy since at least 2012, echo at that time showed EF 30-35%.  EF has been low since that time, most recent echo was in 9/21 with LV EF <15%, moderate-severe likely functional MR, and moderate RV dysfunction.  He has a history of NSVT and is on amiodarone.  He has never had a full workup for etiology of cardiomyopathy (no cath, cMRI, etc).  The prior notes report significant noncompliance with cardiology followup though he seems to be taking his medications appropriately with the help of his sister.  He was never implanted with an ICD due to noncompliance per the notes. He has not been able to take Entresto due to hives.  His last hospitalization was in 9/21 at [facility] with CHF and atrial fibrillation/RVR.  He had cardiogenic shock and was on norepinephrine transiently.  He was cardioverted during that hospitalization.   Last seen in AHF 5/22, work up to investigate CM initiated with [facility] and cMRI. However, he called and cancelled all of his testing and follow ups.   Admitted 3/24 with a/c systolic HF and rate controlled AF. Placed on amiodarone. Echo showed EF 25%, severe LV dysfunction, mild LVH, RV severely reduced and enlarged, moderate TR and moderate to severe MR. He was diuresed with IV lasix and GDMT titrated, but limited by AKI. DCCV not pursued due to questionable compliance with Eliquis. He was discharged home, weight 211 lbs.  Today he returns for post hospital HF follow up with his sister. We have not seen him in clinic since 05/2020. He is SOB walking  short distances and with ADLs. He lives alone, sister helps with his meds. He has LE swelling. Denies palpitations, CP, dizziness, or PND/Orthopnea. Appetite ok. No fever or chills. He does not have a scale at home Says he is taking all medications, has cognitive limitations.  REDs: 57%  ECG (personally reviewed): AF with RVR, frequent PVCs, QRS 104 msec  Labs (6/21): creatinine 1.48 Labs (9/21): creatinine 3.1 Labs (3/22): K 3, creatinine 1.14 Labs (4/24): K 3.6, creatinine 1.84  PMH: 1. Cardiomyopathy: Presumed nonischemic  - Echo (2012) with EF 30-35% - Echo (6/17) with EF 15-20% - Echo (9/21) with EF < 15%, moderate-severe MR, moderate RV dilation with moderately decreased RV systolic function, PASP 56 mmHg.  - Echo (3/24): EF 25%, severe LV dysfunction, mild LVH, RV severely reduced and enlarged, moderate TR and moderate to severe MR.  - Hives with Entresto 2. H/o NSVT: On amiodarone, no ICD placed per notes due to poor compliance.  3. Mitral regurgitation: Moderate-severe on 9/21 and 3/24 echo, suspect functional. 4. HTN 5. Hyperlipidemia 6. Type 2 diabetes 7. CKD: Creatinine has fluctuated markedly over the last year, suspect CKD stage 3.  8. Gout 9. Atrial fibrillation: Paroxysmal.  DCCV in 12/19 and again in 9/21 during hospitalization at [facility].   Social History   Socioeconomic History   Marital status: Single    Spouse name: Not on file   Number of children: 2   Years of education: Not on file   Highest education level: Not on file  Occupational History   Occupation: Maintenance department    Comment: [employer]  Tobacco Use   Smoking status: Never   Smokeless tobacco: Never  Vaping Use   Vaping Use: Never used  Substance and Sexual Activity   Alcohol use: Yes    Alcohol/week: 14.0 standard drinks of alcohol    Types: 4 Cans of beer, 10 Standard drinks or equivalent per week    Comment: stopped 2019   Drug use: No   Sexual activity: Yes  Other Topics Concern    Not on file  Social History Narrative   Not on file   Social Determinants of Health   Financial Resource Strain: Low Risk  (05/07/2022)   Overall Financial Resource Strain (CARDIA)    Difficulty of Paying Living Expenses: Not hard at all  Food Insecurity: No Food Insecurity (05/07/2022)   Hunger Vital Sign    Worried About Running Out of Food in the Last Year: Never true    Ran Out of Food in the Last Year: Never true  Transportation Needs: No Transportation Needs (05/07/2022)   PRAPARE - Administrator, Civil Service (Medical): No    Lack of Transportation (Non-Medical): No  Physical Activity: Inactive (05/07/2022)   Exercise Vital Sign    Days of Exercise per Week: 0 days    Minutes of Exercise per Session: 0 min  Stress: No Stress Concern Present (05/07/2022)   [employer] of Occupational Health - Occupational Stress Questionnaire    Feeling of Stress : Not at all  Social Connections: Socially Isolated (05/07/2022)   Social Connection and Isolation Panel [NHANES]    Frequency of Communication with Friends and Family: More than three times a week    Frequency of Social Gatherings with Friends and Family: More than three times a week    Attends Religious Services: Never    Database administrator or Organizations: No    Attends Banker Meetings: Never    Marital Status: Never married  Intimate Partner Violence: Not At Risk (05/07/2022)   Humiliation, Afraid, Rape, and Kick questionnaire    Fear of Current or Ex-Partner: No    Emotionally Abused: No    Physically Abused: No    Sexually Abused: No   Family History  Problem Relation Age of Onset   Dementia Mother    COPD Father    Diabetes Father    Coronary artery disease Father    ROS: All systems reviewed and negative except as per HPI.   Current Outpatient Medications  Medication Sig Dispense Refill   allopurinol (ZYLOPRIM) 100 MG tablet TAKE 1/2 (ONE-HALF) TABLET BY MOUTH ONCE DAILY FOR GOUT  30 tablet 0   amiodarone (PACERONE) 200 MG tablet Take 1 tablet (200 mg total) by mouth daily. 90 tablet 3   apixaban (ELIQUIS) 5 MG TABS tablet Take 1 tablet (5 mg total) by mouth 2 (two) times daily. 180 tablet 1   atorvastatin (LIPITOR) 20 MG tablet Take 20 mg by mouth daily.     calcitRIOL (ROCALTROL) 0.25 MCG capsule .25 mcg 3 times a week. Monday , Wednesday, and Friday (Patient taking differently: Take 0.25 mcg by mouth every Monday, Wednesday, and Friday. .25 mcg 3 times a week. Monday , Wednesday, and Friday) 45 capsule 1   furosemide (LASIX) 40 MG tablet Take 1 tablet (40 mg total) by mouth daily. 30 tablet 0   hydrALAZINE (APRESOLINE) 25 MG tablet TAKE 1 TABLET BY MOUTH THREE TIMES DAILY. 90 tablet 6  isosorbide mononitrate (IMDUR) 30 MG 24 hr tablet Take 30 mg by mouth daily.     metoprolol succinate (TOPROL-XL) 25 MG 24 hr tablet TAKE 1 Tablet twice a day 180 tablet 1   potassium chloride (KLOR-CON M) 10 MEQ tablet Take 1 tablet (10 mEq total) by mouth daily. 30 tablet 0   tamsulosin (FLOMAX) 0.4 MG CAPS capsule Take 1 capsule (0.4 mg total) by mouth daily. 90 capsule 1   telmisartan (MICARDIS) 20 MG tablet Take 10 mg by mouth daily.     No current facility-administered medications for this encounter.   Wt Readings from Last 3 Encounters:  05/07/22 100 kg (220 lb 6.4 oz)  05/07/22 95.7 kg (211 lb)  04/21/22 95.9 kg (211 lb 6.7 oz)    BP 110/84   Pulse (!) 118   Wt 100 kg (220 lb 6.4 oz)   SpO2 94%   BMI 33.51 kg/m  Physical Exam General:  Mild conversational dyspnea HEENT: Normal Neck: Supple. JVP to jaw, + v waves. Carotids 2+ bilat; no bruits. No lymphadenopathy or thryomegaly appreciated. Cor: PMI nondisplaced. Regular rate & rhythm. No rubs, gallops, 2/6 HSM apex Lungs: Clear, RLL crackles Abdomen: Soft, nontender, nondistended. No hepatosplenomegaly. No bruits or masses. Good bowel sounds. Extremities: No cyanosis, clubbing, rash, 2+ BLE edema Neuro: Alert &  oriented x 3, cranial nerves grossly intact. Moves all 4 extremities w/o difficulty. Affect pleasant.  Assessment/Plan: 1. Acute on Chronic systolic CHF: Long-standing cardiomyopathy of uncertain etiology.  Echo in 9/21 showed EF < 15% with moderate-severe MR and moderate RV dysfunction.  This echo was apparently done in the setting [facility]  As his cardiomyopathy pre-dates identification of atrial fibrillation, unlikely to have a tachy-mediated cardiomyopathy.  Recent admission 3/24 with a/c HF. Echo showed EF 25%, severe LV dysfunction, mild LVH, RV severely reduced and enlarged. We have been unable to investigate etiology for his CM. He has not had R/LHC or cMRI. Today, he is volume overloaded on exam with NYHA IIIb-IV symptoms. REDs 57%. - CMET, BNP, CBC, lactic acid, HIV drawn in clinic. - Recommend stopping beta blocker with volume overload. - Recommend admission management of volume overloaded and AF with RVR. Will likely need RHC +/- inotropic support. - Continue Farxiga 10 mg daily. - Continue hydralazine 25 mg tid + Imdur 30 mg daily. - Continue telmisartan 10 mg daily (no Entresto 2/2 to hives), pending renal function - No spiro with CKD - He never had an ICD placed apparently due to noncompliance.  With narrow QRS, he would not be a CRT candidate. 2. Atrial fibrillation: Paroxysmal. Recent admission with AF with RVR, rate controlled on amiodarone. He is AF with RVR in clinic today, HR 122 and volume overloaded. Will likely need IV amiodarone, and worth trying to get him in normal rhythm with DCCV after diuresis. He says he has not missed any doses of Eliquis. ? TEE - Continue apixaban 5 mg bid.  - Continue amiodarone 200 mg daily, recent TSH elevated, check free T3 and free T4. He is not on levothyroxine (see #6).  - Consider outpatient sleep study - May need EP eval for ablation candidacy down the road. 3. H/o NSVT: See above discussion about ICD.  He is  on amiodarone. Frequent PVCs on ECG today. - Check Mag 4. Mitral regurgitation: Mod-severe on echo.  Suspect functional, needs diureses as above. 5. CKD stage III: Followed by Dr. Wolfgang PhoenixBhutani. Baseline SCr now 1.8 - BMET today.  6. Hypothyroidism: He is not on levothyroxine. TSH 13.3. He is on amiodarone. - Check Free T3 and Free T4.  7. ? Cognitive impairment: appears mild, amy be related to low output. Will need paramedicine. 8. DM2: On SGLT2i 9. TTP: Followed by Dr. Ellin Saba   Advised evaluation in ED, seen with Dr. Gala Romney. Due to patient's other non-cardiac comorbidities, recommend admission to Hospitalist, with AHF consulting.   Anderson Malta [facility] FNP-BC 05/07/22

## 2022-05-07 NOTE — Assessment & Plan Note (Signed)
Presenting with A-fib RVR. -Started on IV amiodarone per cardiology -Eliquis on hold and placed on IV heparin per cards -Continue Toprol-XL 25 mg daily

## 2022-05-07 NOTE — Progress Notes (Signed)
ReDS Vest / Clip - 05/07/22 1500       ReDS Vest / Clip   Station Marker C    Ruler Value 25    ReDS Value Range High volume overload    ReDS Actual Value 57

## 2022-05-07 NOTE — Assessment & Plan Note (Signed)
Renal function stable, continue to monitor with diuresis.

## 2022-05-07 NOTE — ED Provider Notes (Signed)
[place] EMERGENCY DEPARTMENT AT [facility] Provider Note   CSN: [id number] Arrival date & time: 05/07/22  1606     History No chief complaint on file.   HPI COHEN NOVACEK is a 77 y.o. male presenting for shortness of breath.  He is a 77 year old male extensive medical history.  States that he has hypertension, hyperlipidemia, diabetes, cardiomyopathy, A-fib RVR, CHF, CKD and history of exacerbations of CHF.  States that he has been taking diuretics including Lasix 40 mg daily but is continuing to gain weight, he is no longer having good urine output.  Feels grossly fatigued dyspneic short of breath. Denies fevers chills nausea vomiting syncope or chest pain. Patient's recorded medical, surgical, social, medication list and allergies were reviewed in the Snapshot window as part of the initial history.   Per outpatient cardiology notes from today  "Physical exam also w/ significant volume overload. EKG shows Afib w/ RVR 122 bpm. BP 110/84. He was sent to the ED by rapid response for initiation of IV Lasix and treatment of afib w/ RVR and admission for a/c CHF. Given other medical issues including cognitive impairment and CKD IIIb, he will need to be admitted by internal medicine. AHF team will serve as consultants. "  Review of Systems   Review of Systems  Constitutional:  Negative for chills and fever.  HENT:  Negative for ear pain and sore throat.   Eyes:  Negative for pain and visual disturbance.  Respiratory:  Positive for shortness of breath. Negative for cough.   Cardiovascular:  Positive for leg swelling. Negative for chest pain and palpitations.  Gastrointestinal:  Negative for abdominal pain and vomiting.  Genitourinary:  Negative for dysuria and hematuria.  Musculoskeletal:  Negative for arthralgias and back pain.  Skin:  Negative for color change and rash.  Neurological:  Negative for seizures and syncope.  All other systems reviewed and are  negative.   Physical Exam Updated Vital Signs BP (!) 128/92 (BP Location: Right Arm)   Pulse (!) 112   Temp 97.8 F (36.6 C) (Oral)   Resp 20   Ht 5\' 8"  (1.727 m)   Wt 100 kg   SpO2 93%   BMI 33.51 kg/m  Physical Exam Vitals and nursing note reviewed.  Constitutional:      General: He is not in acute distress.    Appearance: He is well-developed.  HENT:     Head: Normocephalic and atraumatic.  Eyes:     Conjunctiva/sclera: Conjunctivae normal.  Cardiovascular:     Rate and Rhythm: Normal rate and regular rhythm.     Heart sounds: No murmur heard. Pulmonary:     Effort: Pulmonary effort is normal. Tachypnea present. No respiratory distress.     Breath sounds: Decreased breath sounds present.  Abdominal:     Palpations: Abdomen is soft.     Tenderness: There is no abdominal tenderness.  Musculoskeletal:        General: No swelling.     Cervical back: Neck supple.     Right lower leg: Edema present.     Left lower leg: Edema present.  Skin:    General: Skin is warm and dry.     Capillary Refill: Capillary refill takes less than 2 seconds.  Neurological:     Mental Status: He is alert.  Psychiatric:        Mood and Affect: Mood normal.      ED Course/ Medical Decision Making/ A&P    Procedures  Procedures   Medications Ordered in ED Medications  furosemide (LASIX) injection 80 mg (80 mg Intravenous Given 05/07/22 2100)  amiodarone (NEXTERONE PREMIX) 360-4.14 MG/200ML-% (1.8 mg/mL) IV infusion (60 mg/hr Intravenous New Bag/Given 05/07/22 2106)  atorvastatin (LIPITOR) tablet 20 mg (20 mg Oral Given 05/07/22 2104)  hydrALAZINE (APRESOLINE) tablet 25 mg (25 mg Oral Given 05/07/22 2117)  isosorbide mononitrate (IMDUR) 24 hr tablet 30 mg (has no administration in time range)  metoprolol succinate (TOPROL-XL) 24 hr tablet 25 mg (has no administration in time range)  heparin ADULT infusion 100 units/mL (25000 units/256mL) (1,400 Units/hr Intravenous New Bag/Given 05/07/22  2117)  amiodarone (NEXTERONE PREMIX) 360-4.14 MG/200ML-% (1.8 mg/mL) IV infusion (has no administration in time range)    Medical Decision Making:    KUMAIL TREMAIN is a 77 y.o. male who presented to the ED today with acute on chronic shortness of breath detailed above.    Complete initial physical exam performed, notably the patient  was hemodynamically stable no acute distress.  Grossly volume overloaded, tachycardic to 120.      Reviewed and confirmed nursing documentation for past medical history, family history, social history.    Initial Assessment:   Patient presents with exam findings reveal heart failure exacerbation as well as atrial fibrillation with RVR.  He was started on amiodarone per recommendations from cardiology and arranged for admission for further care and management.  Patient admitted with no further acute events.   .Disposition:   Based on the above findings, I believe this patient is stable for admission.    Patient/family educated about specific findings on our evaluation and explained exact reasons for admission.  Patient/family educated about clinical situation and time was allowed to answer questions.   Admission team communicated with and agreed with need for admission. Patient admitted. Patient ready to move at this time.     Emergency Department Medication Summary:   Medications  furosemide (LASIX) injection 80 mg (80 mg Intravenous Given 05/07/22 2100)  amiodarone (NEXTERONE PREMIX) 360-4.14 MG/200ML-% (1.8 mg/mL) IV infusion (60 mg/hr Intravenous New Bag/Given 05/07/22 2106)  atorvastatin (LIPITOR) tablet 20 mg (20 mg Oral Given 05/07/22 2104)  hydrALAZINE (APRESOLINE) tablet 25 mg (25 mg Oral Given 05/07/22 2117)  isosorbide mononitrate (IMDUR) 24 hr tablet 30 mg (has no administration in time range)  metoprolol succinate (TOPROL-XL) 24 hr tablet 25 mg (has no administration in time range)  heparin ADULT infusion 100 units/mL (25000 units/268mL) (1,400  Units/hr Intravenous New Bag/Given 05/07/22 2117)  amiodarone (NEXTERONE PREMIX) 360-4.14 MG/200ML-% (1.8 mg/mL) IV infusion (has no administration in time range)       Clinical Impression:  1. Acute on chronic combined systolic and diastolic congestive heart failure      Admit   Final Clinical Impression(s) / ED Diagnoses Final diagnoses:  Acute on chronic combined systolic and diastolic congestive heart failure    Rx / DC Orders ED Discharge Orders     None         Glyn Ade, MD 05/07/22 2239

## 2022-05-07 NOTE — ED Triage Notes (Signed)
Pt came in via POV from his PCP office d/t fluid overload, bil feet swelling, some difficulty breathing & feeling SOB. A/Ox4, denies pain, reports not missing any of his "fluid pills."

## 2022-05-07 NOTE — ED Provider Triage Note (Signed)
Emergency Medicine Provider Triage Evaluation Note  ZEEK HIDROGO , a 77 y.o. male  was evaluated in triage.  Pt complains of shortness breath.  Patient was hospitalized a week ago, seen by cardiologist today and has gained 15 pounds in the last week.  Also A-fib with RVR, sent to ED for admission and further evaluation.  Review of Systems  Per HPI  Physical Exam  BP (!) 130/113 (BP Location: Right Arm)   Pulse (!) 102   Temp 97.9 F (36.6 C) (Oral)   Resp 20   SpO2 95%  Gen:   Awake, no distress   Resp:  Normal effort  MSK:   Moves extremities without difficulty  Other:  Bilateral lower extremity edema, tachycardic with regular rhythm  Medical Decision Making  Medically screening exam initiated at 4:28 PM.  Appropriate orders placed.  Daymond L Hibbard was informed that the remainder of the evaluation will be completed by another provider, this initial triage assessment does not replace that evaluation, and the importance of remaining in the ED until their evaluation is complete.     Theron Arista, PA-C 05/07/22 1629
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
## 2022-05-08 ENCOUNTER — Encounter (HOSPITAL_COMMUNITY): Payer: Self-pay | Admitting: Internal Medicine

## 2022-05-08 DIAGNOSIS — I5023 Acute on chronic systolic (congestive) heart failure: Secondary | ICD-10-CM | POA: Diagnosis not present

## 2022-05-08 LAB — BASIC METABOLIC PANEL
Anion gap: 16 — ABNORMAL HIGH (ref 5–15)
BUN: 32 mg/dL — ABNORMAL HIGH (ref 8–23)
CO2: 29 mmol/L (ref 22–32)
Calcium: 8.6 mg/dL — ABNORMAL LOW (ref 8.9–10.3)
Chloride: 93 mmol/L — ABNORMAL LOW (ref 98–111)
Creatinine, Ser: 2.13 mg/dL — ABNORMAL HIGH (ref 0.61–1.24)
GFR, Estimated: 32 mL/min — ABNORMAL LOW (ref >60)
Glucose, Bld: 137 mg/dL — ABNORMAL HIGH (ref 70–99)
Potassium: 5.1 mmol/L (ref 3.5–5.1)
Sodium: 138 mmol/L (ref 135–145)

## 2022-05-08 LAB — MRSA NEXT GEN BY PCR, NASAL: MRSA by PCR Next Gen: DETECTED — AB

## 2022-05-08 LAB — CBC
HCT: 40.1 % (ref 39.0–52.0)
Hemoglobin: 12.4 g/dL — ABNORMAL LOW (ref 13.0–17.0)
MCH: 26.7 pg (ref 26.0–34.0)
MCHC: 30.9 g/dL (ref 30.0–36.0)
MCV: 86.2 fL (ref 80.0–100.0)
Platelets: 100 K/uL — ABNORMAL LOW (ref 150–400)
RBC: 4.65 MIL/uL (ref 4.22–5.81)
RDW: 17.6 % — ABNORMAL HIGH (ref 11.5–15.5)
WBC: 5.2 K/uL (ref 4.0–10.5)
nRBC: 0 % (ref 0.0–0.2)

## 2022-05-08 LAB — T3, FREE: T3, Free: 2 pg/mL (ref 2.0–4.4)

## 2022-05-08 LAB — MAGNESIUM: Magnesium: 1.9 mg/dL (ref 1.7–2.4)

## 2022-05-08 LAB — GLUCOSE, CAPILLARY
Glucose-Capillary: 113 mg/dL — ABNORMAL HIGH (ref 70–99)
Glucose-Capillary: 120 mg/dL — ABNORMAL HIGH (ref 70–99)
Glucose-Capillary: 131 mg/dL — ABNORMAL HIGH (ref 70–99)

## 2022-05-08 LAB — CBG MONITORING, ED
Glucose-Capillary: 71 mg/dL (ref 70–99)
Glucose-Capillary: 140 mg/dL — ABNORMAL HIGH (ref 70–99)

## 2022-05-08 LAB — APTT: aPTT: 86 seconds — ABNORMAL HIGH (ref 24–36)

## 2022-05-08 LAB — TSH: TSH: 11.841 u[IU]/mL — ABNORMAL HIGH (ref 0.350–4.500)

## 2022-05-08 LAB — HEPARIN LEVEL (UNFRACTIONATED): Heparin Unfractionated: >1.1 [IU]/mL — ABNORMAL HIGH (ref 0.30–0.70)

## 2022-05-08 MED ORDER — METOLAZONE 2.5 MG PO TABS
5.0000 mg | ORAL_TABLET | Freq: Every day | ORAL | Status: DC
  Administered 2022-05-08: 5 mg via ORAL
  Filled 2022-05-08: qty 1

## 2022-05-08 MED ORDER — ORAL CARE MOUTH RINSE: 15.0000 mL | OROMUCOSAL | Status: DC | PRN

## 2022-05-08 MED ORDER — LEVOTHYROXINE SODIUM 75 MCG PO TABS
75.0000 ug | ORAL_TABLET | Freq: Every day | ORAL | Status: DC
  Administered 2022-05-08 – 2022-05-13 (×6): 75 ug via ORAL
  Filled 2022-05-08 (×6): qty 1

## 2022-05-08 MED ORDER — ALLOPURINOL 100 MG PO TABS
50.0000 mg | ORAL_TABLET | Freq: Every day | ORAL | Status: DC
  Administered 2022-05-08 – 2022-05-13 (×6): 50 mg via ORAL
  Filled 2022-05-08 (×6): qty 1

## 2022-05-08 MED ORDER — ISOSORBIDE MONONITRATE ER 30 MG PO TB24
15.0000 mg | ORAL_TABLET | Freq: Every day | ORAL | Status: DC
  Administered 2022-05-08 – 2022-05-09 (×2): 15 mg via ORAL
  Filled 2022-05-08 (×2): qty 1

## 2022-05-08 MED ORDER — MUPIROCIN 2 % EX OINT
1.0000 | TOPICAL_OINTMENT | Freq: Two times a day (BID) | CUTANEOUS | Status: AC
  Administered 2022-05-08 – 2022-05-13 (×10): 1 via NASAL
  Filled 2022-05-08 (×3): qty 22

## 2022-05-08 MED ORDER — HYDRALAZINE HCL 10 MG PO TABS
10.0000 mg | ORAL_TABLET | Freq: Three times a day (TID) | ORAL | Status: DC
  Administered 2022-05-08 – 2022-05-09 (×6): 10 mg via ORAL
  Filled 2022-05-08 (×6): qty 1

## 2022-05-08 MED ORDER — APIXABAN 5 MG PO TABS
5.0000 mg | ORAL_TABLET | Freq: Two times a day (BID) | ORAL | Status: DC
  Administered 2022-05-08 – 2022-05-13 (×11): 5 mg via ORAL
  Filled 2022-05-08 (×11): qty 1

## 2022-05-08 MED ORDER — CHLORHEXIDINE GLUCONATE CLOTH 2 % EX PADS
6.0000 | MEDICATED_PAD | Freq: Every day | CUTANEOUS | Status: AC
  Administered 2022-05-08 – 2022-05-12 (×5): 6 via TOPICAL
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
## 2022-05-08 NOTE — Progress Notes (Signed)
ANTICOAGULATION CONSULT NOTE - Follow Up Consult  Pharmacy Consult for heparin Indication: atrial fibrillation  Labs: Recent Labs    05/07/22 1548 05/07/22 1644 05/07/22 2056 05/08/22 0437  HGB 13.1 12.5*  --  12.4*  HCT 41.6 39.3  --  40.1  PLT 107* 102*  --  100*  APTT  --   --   --  86*  HEPARINUNFRC  --   --  >1.10*  --   CREATININE 2.15* 2.01*  --   --   TROPONINIHS  --  27* 26*  --     Assessment/Plan:  77yo male therapeutic on heparin with initial dosing for Afib while Eliquis on hold. Will continue infusion at current rate of 1400 units/hr and confirm stable with additional PTT.   Vernard Gambles, PharmD, BCPS  05/08/2022,5:25 AM

## 2022-05-08 NOTE — Progress Notes (Signed)
Heart Failure Navigator Progress Note  Assessed for Heart & Vascular TOC clinic readiness.  Patient does not meet criteria due to Advanced Heart Failure Team patient.   Navigator will sign off at this time.    Domino Holten, BSN, RN Heart Failure Nurse Navigator Secure Chat Only   
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
## 2022-05-08 NOTE — Progress Notes (Signed)
Orthopedic Tech Progress Note Patient Details:  Phillip Wilson 1945-08-26 [id number]  Unna boots applied to BLE today. No open wounds were noted.  Ortho Devices Type of Ortho Device: Radio broadcast assistant Ortho Device/Splint Location: BLE Ortho Device/Splint Interventions: Ordered, Application, Adjustment   Post Interventions Patient Tolerated: Well Instructions Provided: Care of device  Phillip Wilson 05/08/2022, 3:19 PM

## 2022-05-09 ENCOUNTER — Telehealth (HOSPITAL_COMMUNITY): Payer: Self-pay | Admitting: Pharmacy Technician

## 2022-05-09 ENCOUNTER — Other Ambulatory Visit (HOSPITAL_COMMUNITY): Payer: Self-pay

## 2022-05-09 DIAGNOSIS — I5023 Acute on chronic systolic (congestive) heart failure: Secondary | ICD-10-CM | POA: Diagnosis not present

## 2022-05-09 LAB — T4, FREE: Free T4: 1.26 ng/dL — ABNORMAL HIGH (ref 0.61–1.12)

## 2022-05-09 LAB — CBC
HCT: 41.3 % (ref 39.0–52.0)
Hemoglobin: 13.1 g/dL (ref 13.0–17.0)
MCH: 26.4 pg (ref 26.0–34.0)
MCHC: 31.7 g/dL (ref 30.0–36.0)
MCV: 83.3 fL (ref 80.0–100.0)
Platelets: 114 10*3/uL — ABNORMAL LOW (ref 150–400)
RBC: 4.96 MIL/uL (ref 4.22–5.81)
RDW: 17.1 % — ABNORMAL HIGH (ref 11.5–15.5)
WBC: 5.8 10*3/uL (ref 4.0–10.5)
nRBC: 0 % (ref 0.0–0.2)

## 2022-05-09 LAB — BASIC METABOLIC PANEL
Anion gap: 12 (ref 5–15)
BUN: 33 mg/dL — ABNORMAL HIGH (ref 8–23)
CO2: 31 mmol/L (ref 22–32)
Calcium: 8.6 mg/dL — ABNORMAL LOW (ref 8.9–10.3)
Chloride: 94 mmol/L — ABNORMAL LOW (ref 98–111)
Creatinine, Ser: 2.18 mg/dL — ABNORMAL HIGH (ref 0.61–1.24)
GFR, Estimated: 31 mL/min — ABNORMAL LOW (ref >60)
Glucose, Bld: 115 mg/dL — ABNORMAL HIGH (ref 70–99)
Potassium: 3.1 mmol/L — ABNORMAL LOW (ref 3.5–5.1)
Sodium: 137 mmol/L (ref 135–145)

## 2022-05-09 LAB — GLUCOSE, CAPILLARY
Glucose-Capillary: 104 mg/dL — ABNORMAL HIGH (ref 70–99)
Glucose-Capillary: 107 mg/dL — ABNORMAL HIGH (ref 70–99)
Glucose-Capillary: 118 mg/dL — ABNORMAL HIGH (ref 70–99)
Glucose-Capillary: 132 mg/dL — ABNORMAL HIGH (ref 70–99)
Glucose-Capillary: 135 mg/dL — ABNORMAL HIGH (ref 70–99)
Glucose-Capillary: 90 mg/dL (ref 70–99)

## 2022-05-09 MED ORDER — POTASSIUM CHLORIDE CRYS ER 20 MEQ PO TBCR
60.0000 meq | EXTENDED_RELEASE_TABLET | ORAL | Status: AC
  Administered 2022-05-09 (×2): 60 meq via ORAL
  Filled 2022-05-09 (×2): qty 3

## 2022-05-09 MED ORDER — GABAPENTIN 100 MG PO CAPS: 200.0000 mg | ORAL_CAPSULE | Freq: Two times a day (BID) | ORAL | Status: DC

## 2022-05-09 MED ORDER — METOLAZONE 2.5 MG PO TABS
5.0000 mg | ORAL_TABLET | Freq: Once | ORAL | Status: AC
  Administered 2022-05-09: 5 mg via ORAL
  Filled 2022-05-09: qty 2

## 2022-05-09 MED ORDER — DAPAGLIFLOZIN PROPANEDIOL 10 MG PO TABS
10.0000 mg | ORAL_TABLET | Freq: Every day | ORAL | Status: DC
  Administered 2022-05-09 – 2022-05-13 (×5): 10 mg via ORAL
  Filled 2022-05-09 (×5): qty 1
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
## 2022-05-09 NOTE — Progress Notes (Cosign Needed)
Will continue diuresing over the weekend. Improving.   Discussed with Dr. Gala Romney, orders placed for DCCV Monday at 1400. Plan discussed with patient, agreeable to proceed.   Brynda Peon, AGACNP-BC  Advanced Heart Failure Team  05/09/22 3:24 PM

## 2022-05-09 NOTE — TOC Benefit Eligibility Note (Signed)
Patient Product/process development scientist completed.    The patient is currently admitted and upon discharge could be taking Farxiga 10 mg.  The current 30 day co-pay is $0.00.   The patient is insured through [employer]   This test claim was processed through [facility]- copay amounts may vary at other pharmacies due to pharmacy/plan contracts, or as the patient moves through the different stages of their insurance plan.  Roland Earl, CPHT Pharmacy Patient Advocate Specialist [facility] Patient Advocate Team Direct Number: [phone]  Fax: [phone]

## 2022-05-09 NOTE — TOC Initial Note (Addendum)
Transition of Care [facility]) - Initial/Assessment Note    Patient Details  Name: Phillip Wilson MRN: [id number] Date of Birth: 1945/09/06  Transition of Care [facility]) CM/SW Contact:    Elliot Cousin, RN Phone Number: [phone] 05/09/2022, 12:58 PM  Clinical Narrative:                 CM spoke to pt at bedside and states he lives alone. Drives to his appts. Dtr assist with care as needed. Waiting PT/OT evaluation. Has scale for daily weight and educated on importance on heart healthy low sodium diet. Will continue to follow up for dc.   Expected Discharge Plan: Home w Home Health Services Barriers to Discharge: Continued Medical Work up   Patient Goals and CMS Choice Patient states their goals for this hospitalization and ongoing recovery are:: wants to get better and go home      Expected Discharge Plan and Services   Discharge Planning Services: CM Consult   Living arrangements for the past 2 months: Single Family Home   Prior Living Arrangements/Services Living arrangements for the past 2 months: Single Family Home Lives with:: Self Patient language and need for interpreter reviewed:: Yes Do you feel safe going back to the place where you live?: Yes      Need for Family Participation in Patient Care: No (Comment) Care giver support system in place?: Yes (comment) Current home services: DME (scale, cane) Criminal Activity/Legal Involvement Pertinent to Current Situation/Hospitalization: No - Comment as needed  Activities of Daily Living Home Assistive Devices/Equipment: Eyeglasses, Dentures (specify type) (reading glasses) ADL Screening (condition at time of admission) Patient's cognitive ability adequate to safely complete daily activities?: Yes Is the patient deaf or have difficulty hearing?: Yes (R ear difficulty hearing) Does the patient have difficulty seeing, even when wearing glasses/contacts?: No Does the patient have difficulty concentrating, remembering,  or making decisions?: No Patient able to express need for assistance with ADLs?: Yes Does the patient have difficulty dressing or bathing?: No Independently performs ADLs?: Yes (appropriate for developmental age) Does the patient have difficulty walking or climbing stairs?: No Weakness of Legs: None Weakness of Arms/Hands: None  Permission Sought/Granted Permission sought to share information with : Case Manager, Family Supports, PCP Permission granted to share information with : Yes, Verbal Permission Granted  Share Information with NAME: Crystal Brim     Permission granted to share info w Relationship: daughter  Permission granted to share info w Contact Information: [phone]  Emotional Assessment Appearance:: Appears stated age Attitude/Demeanor/Rapport: Engaged Affect (typically observed): Accepting Orientation: : Oriented to Self, Oriented to Place, Oriented to  Time, Oriented to Situation   Psych Involvement: No (comment)  Admission diagnosis:  Acute on chronic combined systolic and diastolic congestive heart failure [I50.43] Acute on chronic HFrEF (heart failure with reduced ejection fraction) [I50.23] Patient Active Problem List   Diagnosis Date Noted   Elevated serum free T4 level 05/07/2022   Acute exacerbation of CHF (congestive heart failure) 04/18/2022   Iron deficiency anemia 04/05/2020   Gout attack 03/14/2019   Chronic kidney disease, stage 3b    Acute on chronic HFrEF (heart failure with reduced ejection fraction) 12/28/2017   CHF (congestive heart failure) 02/27/2016   Hypokalemia 02/05/2016   Bilateral lower extremity edema    Gastroesophageal reflux disease    Type 2 diabetes mellitus with diabetic nephropathy, without long-term current use of insulin    Thrombocytopenia 01/24/2011   Cardiomyopathy 12/28/2010   Paroxysmal atrial fibrillation with  RVR 12/28/2010   Vitamin D deficiency 09/03/2010   Hyperlipidemia 05/05/2006   HTN (hypertension)  04/02/2006   PCP:  Benita Stabile, MD Pharmacy:   [facility] [address] [place] - [address] [place] [address] [place] [address] Phone: [phone] Fax: [phone]  [facility] - [place], [place] - [address] [place] [address] Phone: [phone] Fax: [phone]  [facility] [phone] - [place],  - [address] AT [facility] [address] [place] [address] Phone: [phone] Fax: [phone]     Social Determinants of Health (SDOH) Social History: SDOH Screenings   Food Insecurity: Food Insecurity Present (05/08/2022)  Housing: Low Risk  (05/08/2022)  Transportation Needs: No Transportation Needs (05/08/2022)  Utilities: Not At Risk (05/08/2022)  Alcohol Screen: Low Risk  (05/07/2022)  Depression (PHQ2-9): Low Risk  (05/07/2022)  Financial Resource Strain: Low Risk  (05/07/2022)  Physical Activity: Inactive (05/07/2022)  Social Connections: Socially Isolated (05/07/2022)  Stress: No Stress Concern Present (05/07/2022)  Tobacco Use: Low Risk  (05/08/2022)   SDOH Interventions:     Readmission Risk Interventions     No data to display

## 2022-05-09 NOTE — Plan of Care (Signed)
  Problem: Education: Goal: Ability to demonstrate management of disease process will improve Outcome: Progressing Goal: Ability to verbalize understanding of medication therapies will improve Outcome: Progressing   

## 2022-05-09 NOTE — Care Management Important Message (Signed)
Important Message  Patient Details  Name: Phillip Wilson MRN: [id number] Date of Birth: 1945-03-08   Medicare Important Message Given:  Yes     Tyne Banta Stefan Church 05/09/2022, 2:21 PM

## 2022-05-09 NOTE — Telephone Encounter (Signed)
Pharmacy Patient Advocate Encounter  Insurance verification completed.    The patient is insured through [employer] Medicare Part D   The patient is currently admitted and ran test claims for the following: Farxiga.  Copays and coinsurance results were relayed to Inpatient clinical team.  

## 2022-05-10 DIAGNOSIS — I5023 Acute on chronic systolic (congestive) heart failure: Secondary | ICD-10-CM | POA: Diagnosis not present

## 2022-05-10 LAB — GLUCOSE, CAPILLARY
Glucose-Capillary: 114 mg/dL — ABNORMAL HIGH (ref 70–99)
Glucose-Capillary: 133 mg/dL — ABNORMAL HIGH (ref 70–99)
Glucose-Capillary: 146 mg/dL — ABNORMAL HIGH (ref 70–99)
Glucose-Capillary: 93 mg/dL (ref 70–99)

## 2022-05-10 LAB — BASIC METABOLIC PANEL
Anion gap: 11 (ref 5–15)
BUN: 37 mg/dL — ABNORMAL HIGH (ref 8–23)
CO2: 34 mmol/L — ABNORMAL HIGH (ref 22–32)
Calcium: 9.2 mg/dL (ref 8.9–10.3)
Chloride: 90 mmol/L — ABNORMAL LOW (ref 98–111)
Creatinine, Ser: 2.61 mg/dL — ABNORMAL HIGH (ref 0.61–1.24)
GFR, Estimated: 25 mL/min — ABNORMAL LOW (ref >60)
Glucose, Bld: 114 mg/dL — ABNORMAL HIGH (ref 70–99)
Potassium: 4.5 mmol/L (ref 3.5–5.1)
Sodium: 135 mmol/L (ref 135–145)

## 2022-05-10 LAB — MAGNESIUM: Magnesium: 2 mg/dL (ref 1.7–2.4)

## 2022-05-10 NOTE — Progress Notes (Signed)
Advanced Heart Failure Rounding Note  PCP-Cardiologist: None   Subjective:    Now on amio gtt. Remains in a fib. Plan for DCCV once diuresed.   Has diuresed very well. Weight down 8 pounds over night (23 pounds total).   Breathing much better. Scr rising 2.1 -> 2.6. denies SOB, orthopnea or PND.    Objective:   Weight Range: 89.8 kg Body mass index is 30.1 kg/m.   Vital Signs:   Temp:  [97.6 F (36.4 C)-98.7 F (37.1 C)] 97.6 F (36.4 C) (04/13 0734) Pulse Rate:  [93-107] 99 (04/13 0734) Resp:  [17-22] 19 (04/13 0734) BP: (95-113)/(74-95) 95/77 (04/13 0734) SpO2:  [90 %-97 %] 96 % (04/13 0734) Weight:  [89.8 kg] 89.8 kg (04/13 0423) Last BM Date : 05/09/22  Weight change: Filed Weights   05/08/22 1350 05/09/22 0630 05/10/22 0423  Weight: 97.7 kg 93.2 kg 89.8 kg    Intake/Output:   Intake/Output Summary (Last 24 hours) at 05/10/2022 0928 Last data filed at 05/10/2022 0740 Gross per 24 hour  Intake 1238.6 ml  Output 5025 ml  Net -3786.4 ml       Physical Exam    General:  Elderly No resp difficulty HEENT: normal Neck: supple. JVP 8 . Carotids 2+ bilat; no bruits. No lymphadenopathy or thryomegaly appreciated. Cor: PMI nondisplaced. Irregular rate & rhythm. No rubs, gallops or murmurs. Lungs: clear Abdomen: soft, nontender, nondistended. No hepatosplenomegaly. No bruits or masses. Good bowel sounds. Extremities: no cyanosis, clubbing, rash, edema Neuro: alert & orientedx3, cranial nerves grossly intact. moves all 4 extremities w/o difficulty. Affect pleasant  Telemetry   A fib 90s- low 100s (Personally reviewed)    Labs    CBC Recent Labs    05/07/22 1644 05/08/22 0437 05/09/22 0002  WBC 5.2 5.2 5.8  NEUTROABS 4.0  --   --   HGB 12.5* 12.4* 13.1  HCT 39.3 40.1 41.3  MCV 84.5 86.2 83.3  PLT 102* 100* 114*    Basic Metabolic Panel Recent Labs    16/10/96 0437 05/09/22 0002 05/10/22 0005  NA 138 137 135  K 5.1 3.1* 4.5  CL 93* 94*  90*  CO2 29 31 34*  GLUCOSE 137* 115* 114*  BUN 32* 33* 37*  CREATININE 2.13* 2.18* 2.61*  CALCIUM 8.6* 8.6* 9.2  MG 1.9  --  2.0    Liver Function Tests Recent Labs    05/07/22 1548  AST 42*  ALT 19  ALKPHOS 192*  BILITOT 3.4*  PROT 6.6  ALBUMIN 3.3*    No results for input(s): "LIPASE", "AMYLASE" in the last 72 hours. Cardiac Enzymes No results for input(s): "CKTOTAL", "CKMB", "CKMBINDEX", "TROPONINI" in the last 72 hours.  BNP: BNP (last 3 results) Recent Labs    04/18/22 0936 05/07/22 1548 05/07/22 1644  BNP 2,367.0* 0,454.0* 3,386.5*     ProBNP (last 3 results) No results for input(s): "PROBNP" in the last 8760 hours.   D-Dimer No results for input(s): "DDIMER" in the last 72 hours. Hemoglobin A1C No results for input(s): "HGBA1C" in the last 72 hours. Fasting Lipid Panel No results for input(s): "CHOL", "HDL", "LDLCALC", "TRIG", "CHOLHDL", "LDLDIRECT" in the last 72 hours. Thyroid Function Tests Recent Labs    05/07/22 1548 05/08/22 0437  TSH  --  11.841*  T3FREE 2.0  --      Other results:   Imaging    No results found.   Medications:     Scheduled Medications:  allopurinol  50  mg Oral Daily   apixaban  5 mg Oral BID   atorvastatin  20 mg Oral Daily   Chlorhexidine Gluconate Cloth  6 each Topical Q0600   dapagliflozin propanediol  10 mg Oral Daily   furosemide  80 mg Intravenous BID   insulin aspart  0-9 Units Subcutaneous TID WC   isosorbide mononitrate  15 mg Oral Daily   levothyroxine  75 mcg Oral Q0600   metoprolol succinate  25 mg Oral Daily   mupirocin ointment  1 Application Nasal BID   sodium chloride flush  3 mL Intravenous Q12H   tamsulosin  0.4 mg Oral Daily    Infusions:  amiodarone 30 mg/hr (05/10/22 0132)    PRN Medications: acetaminophen **OR** acetaminophen, mouth rinse, senna-docusate  Patient Profile  77 y/o AAM w/ chronic biventricular heart failure, PAF, CKD IIIb, NSVT, HTN, MR, hypothyroidism, TTP  and cognitive impairment, admitted w/ acute on chronic CHF w/ marked volume overload in the setting of atrial fibrillation w/ RVR.   Assessment/Plan   1. Acute on Chronic Systolic Heart Failure - Long-standing cardiomyopathy of uncertain etiology (never had full workup of etiology) - Echo 9/21 with LV EF <15%, moderate-severe likely functional MR, and moderate RV dysfunction - Echo 3/24 EF 25%, severe LV dysfunction, mild LVH, RV severely reduced and enlarged, moderate TR and moderate to severe MR  - NYHA Class IIIb. Markedly fluid overloaded on admit.  - Volume status now much improved. Scr up.  - Stop IV lasix.  - plan DCCV on Monday - GDMT limited by CKD IIIb  - Continue Farxiga 10 mg daily - Continue hydralazine 25 mg tid (will hold today with low BP) - Continue Imdur 30 mg  (will hold today with low BP) - Continue Toprol XL 25 mg daily. May need to hold if development of low output  - Intolerant to [place] in the past (hives) - No Cleda Daub w/ CKD  - ordered cMRI but pt has "buckshot pellets" throughout chest -> not be able to complete - place Unna boots    2. Atrial Fibrillation w/ RVR  - Continue amio gtt - Now on eliquis 5 mg BID - keep K > 4.0 and Mg > 2.0  - DCCV on Monday   3. AKI on CKD IIIb - b/l SCr 1.8  - SCr 2.18 -> 2.6 today. Due to overdiuresis.  - Followed by Dr. Wolfgang Phoenix  - hold diuretics today   4. Mitral Regurgitation  - mod- severe on 3/24 - suspect functional - HF and afib optimization per above    5. Cognitive Impairment  - per internal medicine - post discharge, would benefit from paramedicine to help w/ compliance    6. Hypothyroidism - TSH 13.3. He is on amiodarone. - Free T4 1.26 - Continue synthroid - per internal medicine    7. Type 2 DM - SSI  - Now on Farxiga   8. TTP - Followed by Dr. Ellin Saba  - Check CBC  Length of Stay: 3  Arvilla Meres, MD  05/10/2022, 9:28 AM  Advanced Heart Failure Team Pager [phone] (M-F; 7a -  5p)  Please contact [facility] for night-coverage after hours (5p -7a ) and weekends on [email or web address]

## 2022-05-10 NOTE — Progress Notes (Signed)
PROGRESS NOTE    Phillip Wilson  QMV:[id number] DOB: 21-Sep-1945 DOA: 05/07/2022 PCP: Benita Stabile, MD  77/M with history of chronic systolic CHF, EF 28%, paroxysmal A-fib on Eliquis, CKD 3B, type 2 diabetes mellitus, hypertension, chronic thrombocytopenia presented to heart failure clinic 4/10 noted to be grossly volume overloaded with A-fib RVR, sent to the ED for further management. -In the ER noted to be in A-fib RVR, creatinine 2.0, hemoglobin 12.5, BNP 3386, troponin 26, chest x-ray noted cardiomegaly pulmonary vascular congestion -Admitted, started on diuretics and amiodarone gtt., CHF team following  Subjective: -Feels better, breathing is improving, BP soft this am  Assessment and Plan:  Acute on chronic HFrEF  EF 25% by TTE 04/19/2022.   -Significantly volume overloaded, continue IV Lasix, 80 Mg twice daily, CHF team following -He is 10.2 L negative, BP soft, stop hydralazine Imdur, hold lasix -continue Toprol for now, watch BP closely -GDMT limited by CKD -Strict I/O's, daily weights -Increase activity, PT eval  Paroxysmal atrial fibrillation with RVR -Now on IV amiodarone, Toprol -Eliquis on hold, continue IV heparin  -Plan for cardioversion on Monday  Chronic kidney disease, stage 3b -Stable at baseline, monitor with diuresis  Type 2 diabetes mellitus  -Stable, last HbA1c was 6.5  Elevated serum free T4 level Mildly elevated free T4 1.18, TSH was 13.376 on 04/21/2022.  On chronic amiodarone.  -Repeat TSH was elevated at 11.8, started Synthroid 75 mcg yesterday, will need follow-up labs and further titration  Thrombocytopenia Mild and stable.  DVT prophylaxis: IV heparin Code Status: Full code Family Communication: None present Disposition Plan: Home pending improvement in volume status  Consultants: CHF team   Procedures:   Antimicrobials:    Objective: Vitals:   05/10/22 0400 05/10/22 0423 05/10/22 0734 05/10/22 1203  BP:  112/85 95/77   Pulse:   (!) 102 99   Resp:  20 19   Temp:  97.6 F (36.4 C) 97.6 F (36.4 C) 97.8 F (36.6 C)  TempSrc:  Oral Oral Axillary  SpO2: 97% 95% 96%   Weight:  89.8 kg    Height:        Intake/Output Summary (Last 24 hours) at 05/10/2022 1313 Last data filed at 05/10/2022 1204 Gross per 24 hour  Intake 1118.6 ml  Output 4350 ml  Net -3231.4 ml   Filed Weights   05/08/22 1350 05/09/22 0630 05/10/22 0423  Weight: 97.7 kg 93.2 kg 89.8 kg    Examination:  General exam: Chronically ill male sitting up in bed, AAOx3, no distress HEENT: + JVD CVS: S1-S2, irregular Lungs: Decreased breath sounds at the bases Abdomen: Soft, nontender, bowel sounds present Extremities: 2+ edema Skin: No rashes Psychiatry:  Mood & affect appropriate.     Data Reviewed:   CBC: Recent Labs  Lab 05/07/22 1548 05/07/22 1644 05/08/22 0437 05/09/22 0002  WBC 4.6 5.2 5.2 5.8  NEUTROABS  --  4.0  --   --   HGB 13.1 12.5* 12.4* 13.1  HCT 41.6 39.3 40.1 41.3  MCV 84.2 84.5 86.2 83.3  PLT 107* 102* 100* 114*   Basic Metabolic Panel: Recent Labs  Lab 05/07/22 1548 05/07/22 1644 05/08/22 0437 05/09/22 0002 05/10/22 0005  NA 140 140 138 137 135  K 3.8 3.5 5.1 3.1* 4.5  CL 97* 96* 93* 94* 90*  CO2 34*  GLUCOSE 119* 97 137* 115* 114*  BUN 29* 29* 32* 33* 37*  CREATININE 2.15* 2.01* 2.13* 2.18* 2.61*  CALCIUM  9.2 8.9 8.6* 8.6* 9.2  MG 2.0  --  1.9  --  2.0   GFR: Estimated Creatinine Clearance: 26.6 mL/min (A) (by C-G formula based on SCr of 2.61 mg/dL (H)). Liver Function Tests: Recent Labs  Lab 05/07/22 1548  AST 42*  ALT 19  ALKPHOS 192*  BILITOT 3.4*  PROT 6.6  ALBUMIN 3.3*   No results for input(s): "LIPASE", "AMYLASE" in the last 168 hours. No results for input(s): "AMMONIA" in the last 168 hours. Coagulation Profile: No results for input(s): "INR", "PROTIME" in the last 168 hours. Cardiac Enzymes: No results for input(s): "CKTOTAL", "CKMB", "CKMBINDEX", "TROPONINI" in  the last 168 hours. BNP (last 3 results) No results for input(s): "PROBNP" in the last 8760 hours. HbA1C: No results for input(s): "HGBA1C" in the last 72 hours. CBG: Recent Labs  Lab 05/09/22 1627 05/09/22 1942 05/09/22 2336 05/10/22 0629 05/10/22 1208  GLUCAP 132* 90 104* 114* 133*   Lipid Profile: No results for input(s): "CHOL", "HDL", "LDLCALC", "TRIG", "CHOLHDL", "LDLDIRECT" in the last 72 hours. Thyroid Function Tests: Recent Labs    05/07/22 1548 05/08/22 0437 05/09/22 0002  TSH  --  11.841*  --   FREET4 1.18*  --  1.26*  T3FREE 2.0  --   --    Anemia Panel: No results for input(s): "VITAMINB12", "FOLATE", "FERRITIN", "TIBC", "IRON", "RETICCTPCT" in the last 72 hours. Urine analysis:    Component Value Date/Time   COLORURINE YELLOW 01/28/2018 1123   APPEARANCEUR CLEAR 01/28/2018 1123   LABSPEC 1.015 01/28/2018 1123   PHURINE 7.0 01/28/2018 1123   GLUCOSEU NEGATIVE 01/28/2018 1123   HGBUR 2+ (A) 01/28/2018 1123   HGBUR negative 02/14/2008 1349   BILIRUBINUR NEGATIVE 01/11/2018 0636   KETONESUR NEGATIVE 01/28/2018 1123   PROTEINUR NEGATIVE 01/28/2018 1123   UROBILINOGEN 1.0 01/21/2011 1246   NITRITE NEGATIVE 01/28/2018 1123   LEUKOCYTESUR NEGATIVE 01/28/2018 1123   Sepsis Labs: @LABRCNTIP (procalcitonin:4,lacticidven:4)  ) Recent Results (from the past 240 hour(s))  MRSA Next Gen by PCR, Nasal     Status: Abnormal   Collection Time: 05/08/22  1:54 PM   Specimen: Nasal Mucosa; Nasal Swab  Result Value Ref Range Status   MRSA by PCR Next Gen DETECTED (A) NOT DETECTED Final    Comment: RESULT CALLED TO, READ BACK BY AND VERIFIED WITH: RN Nyra Market [phone] @ 1743 FH (NOTE) The GeneXpert MRSA Assay (FDA approved for NASAL specimens only), is one component of a comprehensive MRSA colonization surveillance program. It is not intended to diagnose MRSA infection nor to guide or monitor treatment for MRSA infections. Test performance is not FDA approved in  patients less than 23 years old. Performed at [facility], [address], [place], [place] [address]      Radiology Studies: No results found.   Scheduled Meds:  allopurinol  50 mg Oral Daily   apixaban  5 mg Oral BID   atorvastatin  20 mg Oral Daily   Chlorhexidine Gluconate Cloth  6 each Topical Q0600   dapagliflozin propanediol  10 mg Oral Daily   insulin aspart  0-9 Units Subcutaneous TID WC   levothyroxine  75 mcg Oral Q0600   metoprolol succinate  25 mg Oral Daily   mupirocin ointment  1 Application Nasal BID   sodium chloride flush  3 mL Intravenous Q12H   tamsulosin  0.4 mg Oral Daily   Continuous Infusions:  amiodarone 30 mg/hr (05/10/22 1148)     LOS: 3 days  Time spent:    Zannie Cove, MD [facility]   05/10/2022, 1:13 PM

## 2022-05-11 DIAGNOSIS — I5023 Acute on chronic systolic (congestive) heart failure: Secondary | ICD-10-CM | POA: Diagnosis not present

## 2022-05-11 LAB — BASIC METABOLIC PANEL
Anion gap: 14 (ref 5–15)
BUN: 42 mg/dL — ABNORMAL HIGH (ref 8–23)
CO2: 32 mmol/L (ref 22–32)
Calcium: 8.9 mg/dL (ref 8.9–10.3)
Chloride: 88 mmol/L — ABNORMAL LOW (ref 98–111)
Creatinine, Ser: 2.56 mg/dL — ABNORMAL HIGH (ref 0.61–1.24)
GFR, Estimated: 25 mL/min — ABNORMAL LOW (ref >60)
Glucose, Bld: 109 mg/dL — ABNORMAL HIGH (ref 70–99)
Potassium: 4 mmol/L (ref 3.5–5.1)
Sodium: 134 mmol/L — ABNORMAL LOW (ref 135–145)

## 2022-05-11 LAB — VITAMIN B12: Vitamin B-12: 756 pg/mL (ref 180–914)

## 2022-05-11 LAB — PROTIME-INR
INR: 1.4 — ABNORMAL HIGH (ref 0.8–1.2)
Prothrombin Time: 16.5 seconds — ABNORMAL HIGH (ref 11.4–15.2)

## 2022-05-11 LAB — GLUCOSE, CAPILLARY
Glucose-Capillary: 104 mg/dL — ABNORMAL HIGH (ref 70–99)
Glucose-Capillary: 158 mg/dL — ABNORMAL HIGH (ref 70–99)
Glucose-Capillary: 118 mg/dL — ABNORMAL HIGH (ref 70–99)
Glucose-Capillary: 128 mg/dL — ABNORMAL HIGH (ref 70–99)
Glucose-Capillary: 103 mg/dL — ABNORMAL HIGH (ref 70–99)

## 2022-05-11 MED ORDER — SODIUM CHLORIDE 0.9 % IV SOLN: INTRAVENOUS | Status: DC

## 2022-05-11 NOTE — Progress Notes (Signed)
Advanced Heart Failure Rounding Note  PCP-Cardiologist: None   Subjective:    Now on amio gtt. Remains in a fib. Plan for DCCV in am  Has diuresed very well. Weight down 23 pounds total. Diuretics on hold due to AKI  Scr stabilizing 2.1 -> 2.6 -> 2.6 Denies SOB, orthopnea or PND   Objective:   Weight Range: 89.1 kg Body mass index is 29.87 kg/m.   Vital Signs:   Temp:  [97.3 F (36.3 C)-98.4 F (36.9 C)] 98.4 F (36.9 C) (04/14 0749) Pulse Rate:  [82-90] 90 (04/14 0749) Resp:  [18-19] 18 (04/14 0749) BP: (81-118)/(56-80) 96/60 (04/14 0749) SpO2:  [90 %-94 %] 94 % (04/14 0749) Weight:  [89.1 kg] 89.1 kg (04/14 0405) Last BM Date : 05/09/22  Weight change: Filed Weights   05/09/22 0630 05/10/22 0423 05/11/22 0405  Weight: 93.2 kg 89.8 kg 89.1 kg    Intake/Output:   Intake/Output Summary (Last 24 hours) at 05/11/2022 1006 Last data filed at 05/11/2022 0752 Gross per 24 hour  Intake 760 ml  Output 2650 ml  Net -1890 ml       Physical Exam    General:  Elderly No resp difficulty HEENT: normal Neck: supple. JVP 8 Carotids 2+ bilat; no bruits. No lymphadenopathy or thryomegaly appreciated. Cor: PMI nondisplaced. Regular rate & rhythm. No rubs, gallops or murmurs. Lungs: clear Abdomen: soft, nontender, nondistended. No hepatosplenomegaly. No bruits or masses. Good bowel sounds. Extremities: no cyanosis, clubbing, rash, tr-1+ edema Neuro: alert & orientedx3, cranial nerves grossly intact. moves all 4 extremities w/o difficulty. Affect pleasant   Telemetry   A fib 90s (Personally reviewed)    Labs    CBC Recent Labs    05/09/22 0002  WBC 5.8  HGB 13.1  HCT 41.3  MCV 83.3  PLT 114*    Basic Metabolic Panel Recent Labs    25/36/64 0005 05/11/22 0015  NA 135 134*  K 4.5 4.0  CL 90* 88*  CO2 34* 32  GLUCOSE 114* 109*  BUN 37* 42*  CREATININE 2.61* 2.56*  CALCIUM 9.2 8.9  MG 2.0  --     Liver Function Tests No results for input(s):  "AST", "ALT", "ALKPHOS", "BILITOT", "PROT", "ALBUMIN" in the last 72 hours.  No results for input(s): "LIPASE", "AMYLASE" in the last 72 hours. Cardiac Enzymes No results for input(s): "CKTOTAL", "CKMB", "CKMBINDEX", "TROPONINI" in the last 72 hours.  BNP: BNP (last 3 results) Recent Labs    04/18/22 0936 05/07/22 1548 05/07/22 1644  BNP 2,367.0* 4,034.7* 3,386.5*     ProBNP (last 3 results) No results for input(s): "PROBNP" in the last 8760 hours.   D-Dimer No results for input(s): "DDIMER" in the last 72 hours. Hemoglobin A1C No results for input(s): "HGBA1C" in the last 72 hours. Fasting Lipid Panel No results for input(s): "CHOL", "HDL", "LDLCALC", "TRIG", "CHOLHDL", "LDLDIRECT" in the last 72 hours. Thyroid Function Tests No results for input(s): "TSH", "T4TOTAL", "T3FREE", "THYROIDAB" in the last 72 hours.  Invalid input(s): "FREET3"   Other results:   Imaging    No results found.   Medications:     Scheduled Medications:  allopurinol  50 mg Oral Daily   apixaban  5 mg Oral BID   atorvastatin  20 mg Oral Daily   Chlorhexidine Gluconate Cloth  6 each Topical Q0600   dapagliflozin propanediol  10 mg Oral Daily   insulin aspart  0-9 Units Subcutaneous TID WC   levothyroxine  75 mcg Oral  [id number]   metoprolol succinate  25 mg Oral Daily   mupirocin ointment  1 Application Nasal BID   sodium chloride flush  3 mL Intravenous Q12H   tamsulosin  0.4 mg Oral Daily    Infusions:  amiodarone 30 mg/hr (05/10/22 2200)    PRN Medications: acetaminophen **OR** acetaminophen, mouth rinse, senna-docusate  Patient Profile  77 y/o AAM w/ chronic biventricular heart failure, PAF, CKD IIIb, NSVT, HTN, MR, hypothyroidism, TTP and cognitive impairment, admitted w/ acute on chronic CHF w/ marked volume overload in the setting of atrial fibrillation w/ RVR.   Assessment/Plan   1. Acute on Chronic Systolic Heart Failure - Long-standing cardiomyopathy of uncertain  etiology (never had full workup of etiology) - Echo 9/21 with LV EF <15%, moderate-severe likely functional MR, and moderate RV dysfunction - Echo 3/24 EF 25%, severe LV dysfunction, mild LVH, RV severely reduced and enlarged, moderate TR and moderate to severe MR  - NYHA Class IIIb. Markedly fluid overloaded on admit.  - Volume status now much improved. Weight down 24 pounds - Still with some LE edema but with AKI will hold diuretics one more day. Resume po tomorrow.  - plan DCCV tomorrow - GDMT limited by CKD IIIb  - Continue Farxiga 10 mg daily - Continue hydralazine 25 mg tid (will hold today with low BP) - Continue Imdur 30 mg  (will hold today with low BP) - Continue Toprol XL 25 mg daily. May need to hold if development of low output  - Intolerant to [place] in the past (hives) - No Cleda Daub w/ CKD  - ordered cMRI but pt has "buckshot pellets" throughout chest -> not be able to complete - continue Unna boots    2. Atrial Fibrillation w/ RVR  - Continue amio gtt - Now on eliquis 5 mg BID - keep K > 4.0 and Mg > 2.0  - DCCV tomorrow - NPO after MN   3. AKI on CKD IIIb - b/l SCr 1.8  - SCr 2.18 -> 2.6 -> 2.6 today. Due to overdiuresis.  - Followed by Dr. Wolfgang Phoenix  - hold diuretics again today   4. Mitral Regurgitation  - mod- severe on 3/24 - suspect functional - HF and afib optimization per above    5. Cognitive Impairment  - per internal medicine - post discharge, would benefit from paramedicine to help w/ compliance    6. Hypothyroidism - TSH 13.3. He is on amiodarone. - Free T4 1.26 - Continue synthroid - per internal medicine    7. Type 2 DM - SSI  - Now on Farxiga   8. TTP - Followed by Dr. Primitivo Gauze of Stay: 4  Arvilla Meres, MD  05/11/2022, 10:06 AM  Advanced Heart Failure Team Pager [phone] (M-F; 7a - 5p)  Please contact [facility] for night-coverage after hours (5p -7a ) and weekends on [email or web address]

## 2022-05-11 NOTE — Progress Notes (Addendum)
PROGRESS NOTE    Phillip Wilson  EOF:[id number] DOB: 1945-03-03 DOA: 05/07/2022 PCP: Benita Stabile, MD  77/M with history of chronic systolic CHF, EF 25%, paroxysmal A-fib on Eliquis, CKD 3B, type 2 diabetes mellitus, hypertension, chronic thrombocytopenia presented to heart failure clinic 4/10 noted to be grossly volume overloaded with A-fib RVR, sent to the ED for further management. -In the ER noted to be in A-fib RVR, creatinine 2.0, hemoglobin 12.5, BNP 3386, troponin 26, chest x-ray noted cardiomegaly pulmonary vascular congestion -Admitted, started on diuretics and amiodarone gtt., CHF team following  Subjective: -Feels okay, denies any complaints, ambulated in the halls yesterday, denies dyspnea  Assessment and Plan:  Acute on chronic HFrEF  EF 25% by TTE 04/19/2022.   -CHF team following, diuresed well with IV Lasix, he is 11.6 L negative -BP dropped, discontinued hydralazine, Imdur and Lasix yesterday, can start oral diuretics tomorrow -continue Toprol, Jardiance -GDMT limited by CKD -Strict I/O's, daily weights -Increase activity, PT eval  Paroxysmal atrial fibrillation with RVR -Now on IV amiodarone, Toprol -Eliquis on hold, continue IV heparin  -Plan for cardioversion on Monday  Cognitive impairment -Started Synthroid, check B12  Chronic kidney disease, stage 3b -Mild bump in creatinine yesterday, appears to be still stabilizing, avoid hypotension  Type 2 diabetes mellitus  -Stable, last HbA1c was 6.5  Elevated serum free T4 level Mildly elevated free T4 1.18, TSH was 13.376 on 04/21/2022.  On chronic amiodarone.  -Repeat TSH was elevated at 11.8, started Synthroid 75 mcg this admission, will need follow-up labs and further titration  Thrombocytopenia Mild and stable.  DVT prophylaxis: IV heparin Code Status: Full code Family Communication: None present Disposition Plan: Home pending improvement in volume status  Consultants: CHF team   Procedures:    Antimicrobials:    Objective: Vitals:   05/10/22 2012 05/11/22 0030 05/11/22 0405 05/11/22 0749  BP:  114/73 111/80 96/60  Pulse:  84 88 90  Resp: 19  19 18   Temp:  97.8 F (36.6 C) (!) 97.3 F (36.3 C) 98.4 F (36.9 C)  TempSrc:  Oral Oral Oral  SpO2:  90% 92% 94%  Weight:   89.1 kg   Height:        Intake/Output Summary (Last 24 hours) at 05/11/2022 0929 Last data filed at 05/11/2022 4982 Gross per 24 hour  Intake 760 ml  Output 2650 ml  Net -1890 ml   Filed Weights   05/09/22 0630 05/10/22 0423 05/11/22 0405  Weight: 93.2 kg 89.8 kg 89.1 kg    Examination:  General exam: Elderly frail chronically ill male sitting up in bed, AAOx3, no distress, cognitive deficits HEENT: No JVD CVS: S1-S2, irregularly irregular rhythm Lungs: Decreased breath sounds at the bases Abdomen: Soft, nontender, bowel sounds present Extremities: Trace edema, Unna boots on  Skin: No rashes Psychiatry:  Mood & affect appropriate.     Data Reviewed:   CBC: Recent Labs  Lab 05/07/22 1548 05/07/22 1644 05/08/22 0437 05/09/22 0002  WBC 4.6 5.2 5.2 5.8  NEUTROABS  --  4.0  --   --   HGB 13.1 12.5* 12.4* 13.1  HCT 41.6 39.3 40.1 41.3  MCV 84.2 84.5 86.2 83.3  PLT 107* 102* 100* 114*   Basic Metabolic Panel: Recent Labs  Lab 05/07/22 1548 05/07/22 1644 05/08/22 0437 05/09/22 0002 05/10/22 0005 05/11/22 0015  NA 140 140 138 137 135 134*  K 3.8 3.5 5.1 3.1* 4.5 4.0  CL 97* 96* 93* 94* 90* 88*  CO2 30 28 29 31  34* 32  GLUCOSE 119* 97 137* 115* 114* 109*  BUN 29* 29* 32* 33* 37* 42*  CREATININE 2.15* 2.01* 2.13* 2.18* 2.61* 2.56*  CALCIUM 9.2 8.9 8.6* 8.6* 9.2 8.9  MG 2.0  --  1.9  --  2.0  --    GFR: Estimated Creatinine Clearance: 27 mL/min (A) (by C-G formula based on SCr of 2.56 mg/dL (H)). Liver Function Tests: Recent Labs  Lab 05/07/22 1548  AST 42*  ALT 19  ALKPHOS 192*  BILITOT 3.4*  PROT 6.6  ALBUMIN 3.3*   No results for input(s): "LIPASE", "AMYLASE"  in the last 168 hours. No results for input(s): "AMMONIA" in the last 168 hours. Coagulation Profile: No results for input(s): "INR", "PROTIME" in the last 168 hours. Cardiac Enzymes: No results for input(s): "CKTOTAL", "CKMB", "CKMBINDEX", "TROPONINI" in the last 168 hours. BNP (last 3 results) No results for input(s): "PROBNP" in the last 8760 hours. HbA1C: No results for input(s): "HGBA1C" in the last 72 hours. CBG: Recent Labs  Lab 05/10/22 1208 05/10/22 1551 05/10/22 2111 05/11/22 0623 05/11/22 0751  GLUCAP 133* 146* 93 104* 158*   Lipid Profile: No results for input(s): "CHOL", "HDL", "LDLCALC", "TRIG", "CHOLHDL", "LDLDIRECT" in the last 72 hours. Thyroid Function Tests: Recent Labs    05/09/22 0002  FREET4 1.26*   Anemia Panel: No results for input(s): "VITAMINB12", "FOLATE", "FERRITIN", "TIBC", "IRON", "RETICCTPCT" in the last 72 hours. Urine analysis:    Component Value Date/Time   COLORURINE YELLOW 01/28/2018 1123   APPEARANCEUR CLEAR 01/28/2018 1123   LABSPEC 1.015 01/28/2018 1123   PHURINE 7.0 01/28/2018 1123   GLUCOSEU NEGATIVE 01/28/2018 1123   HGBUR 2+ (A) 01/28/2018 1123   HGBUR negative 02/14/2008 1349   BILIRUBINUR NEGATIVE 01/11/2018 0636   KETONESUR NEGATIVE 01/28/2018 1123   PROTEINUR NEGATIVE 01/28/2018 1123   UROBILINOGEN 1.0 01/21/2011 1246   NITRITE NEGATIVE 01/28/2018 1123   LEUKOCYTESUR NEGATIVE 01/28/2018 1123   Sepsis Labs: @LABRCNTIP (procalcitonin:4,lacticidven:4)  ) Recent Results (from the past 240 hour(s))  MRSA Next Gen by PCR, Nasal     Status: Abnormal   Collection Time: 05/08/22  1:54 PM   Specimen: Nasal Mucosa; Nasal Swab  Result Value Ref Range Status   MRSA by PCR Next Gen DETECTED (A) NOT DETECTED Final    Comment: RESULT CALLED TO, READ BACK BY AND VERIFIED WITH: RN Nyra Market [phone] @ 1743 FH (NOTE) The GeneXpert MRSA Assay (FDA approved for NASAL specimens only), is one component of a comprehensive MRSA  colonization surveillance program. It is not intended to diagnose MRSA infection nor to guide or monitor treatment for MRSA infections. Test performance is not FDA approved in patients less than 55 years old. Performed at [facility], [address] [place], [place] [address]      Radiology Studies: No results found.   Scheduled Meds:  allopurinol  50 mg Oral Daily   apixaban  5 mg Oral BID   atorvastatin  20 mg Oral Daily   Chlorhexidine Gluconate Cloth  6 each Topical Q0600   dapagliflozin propanediol  10 mg Oral Daily   insulin aspart  0-9 Units Subcutaneous TID WC   levothyroxine  75 mcg Oral Q0600   metoprolol succinate  25 mg Oral Daily   mupirocin ointment  1 Application Nasal BID   sodium chloride flush  3 mL Intravenous Q12H   tamsulosin  0.4 mg Oral Daily   Continuous Infusions:  amiodarone 30 mg/hr (05/10/22  2200)     LOS: 4 days    Time spent:    Zannie Cove, MD [facility]   05/11/2022, 9:29 AM

## 2022-05-11 NOTE — H&P (View-Only) (Signed)
Advanced Heart Failure Rounding Note  PCP-Cardiologist: None   Subjective:    Now on amio gtt. Remains in a fib. Plan for DCCV in am  Has diuresed very well. Weight down 23 pounds total. Diuretics on hold due to AKI  Scr stabilizing 2.1 -> 2.6 -> 2.6 Denies SOB, orthopnea or PND   Objective:   Weight Range: 89.1 kg Body mass index is 29.87 kg/m.   Vital Signs:   Temp:  [97.3 F (36.3 C)-98.4 F (36.9 C)] 98.4 F (36.9 C) (04/14 0749) Pulse Rate:  [82-90] 90 (04/14 0749) Resp:  [18-19] 18 (04/14 0749) BP: (81-118)/(56-80) 96/60 (04/14 0749) SpO2:  [90 %-94 %] 94 % (04/14 0749) Weight:  [89.1 kg] 89.1 kg (04/14 0405) Last BM Date : 05/09/22  Weight change: Filed Weights   05/09/22 0630 05/10/22 0423 05/11/22 0405  Weight: 93.2 kg 89.8 kg 89.1 kg    Intake/Output:   Intake/Output Summary (Last 24 hours) at 05/11/2022 1006 Last data filed at 05/11/2022 0752 Gross per 24 hour  Intake 760 ml  Output 2650 ml  Net -1890 ml       Physical Exam    General:  Elderly No resp difficulty HEENT: normal Neck: supple. JVP 8 Carotids 2+ bilat; no bruits. No lymphadenopathy or thryomegaly appreciated. Cor: PMI nondisplaced. Regular rate & rhythm. No rubs, gallops or murmurs. Lungs: clear Abdomen: soft, nontender, nondistended. No hepatosplenomegaly. No bruits or masses. Good bowel sounds. Extremities: no cyanosis, clubbing, rash, tr-1+ edema Neuro: alert & orientedx3, cranial nerves grossly intact. moves all 4 extremities w/o difficulty. Affect pleasant   Telemetry   A fib 90s (Personally reviewed)    Labs    CBC Recent Labs    05/09/22 0002  WBC 5.8  HGB 13.1  HCT 41.3  MCV 83.3  PLT 114*    Basic Metabolic Panel Recent Labs    25/36/64 0005 05/11/22 0015  NA 135 134*  K 4.5 4.0  CL 90* 88*  CO2 34* 32  GLUCOSE 114* 109*  BUN 37* 42*  CREATININE 2.61* 2.56*  CALCIUM 9.2 8.9  MG 2.0  --     Liver Function Tests No results for input(s):  "AST", "ALT", "ALKPHOS", "BILITOT", "PROT", "ALBUMIN" in the last 72 hours.  No results for input(s): "LIPASE", "AMYLASE" in the last 72 hours. Cardiac Enzymes No results for input(s): "CKTOTAL", "CKMB", "CKMBINDEX", "TROPONINI" in the last 72 hours.  BNP: BNP (last 3 results) Recent Labs    04/18/22 0936 05/07/22 1548 05/07/22 1644  BNP 2,367.0* 4,034.7* 3,386.5*     ProBNP (last 3 results) No results for input(s): "PROBNP" in the last 8760 hours.   D-Dimer No results for input(s): "DDIMER" in the last 72 hours. Hemoglobin A1C No results for input(s): "HGBA1C" in the last 72 hours. Fasting Lipid Panel No results for input(s): "CHOL", "HDL", "LDLCALC", "TRIG", "CHOLHDL", "LDLDIRECT" in the last 72 hours. Thyroid Function Tests No results for input(s): "TSH", "T4TOTAL", "T3FREE", "THYROIDAB" in the last 72 hours.  Invalid input(s): "FREET3"   Other results:   Imaging    No results found.   Medications:     Scheduled Medications:  allopurinol  50 mg Oral Daily   apixaban  5 mg Oral BID   atorvastatin  20 mg Oral Daily   Chlorhexidine Gluconate Cloth  6 each Topical Q0600   dapagliflozin propanediol  10 mg Oral Daily   insulin aspart  0-9 Units Subcutaneous TID WC   levothyroxine  75 mcg Oral  Q0600   metoprolol succinate  25 mg Oral Daily   mupirocin ointment  1 Application Nasal BID   sodium chloride flush  3 mL Intravenous Q12H   tamsulosin  0.4 mg Oral Daily    Infusions:  amiodarone 30 mg/hr (05/10/22 2200)    PRN Medications: acetaminophen **OR** acetaminophen, mouth rinse, senna-docusate  Patient Profile  77 y/o AAM w/ chronic biventricular heart failure, PAF, CKD IIIb, NSVT, HTN, MR, hypothyroidism, TTP and cognitive impairment, admitted w/ acute on chronic CHF w/ marked volume overload in the setting of atrial fibrillation w/ RVR.   Assessment/Plan   1. Acute on Chronic Systolic Heart Failure - Long-standing cardiomyopathy of uncertain  etiology (never had full workup of etiology) - Echo 9/21 with LV EF <15%, moderate-severe likely functional MR, and moderate RV dysfunction - Echo 3/24 EF 25%, severe LV dysfunction, mild LVH, RV severely reduced and enlarged, moderate TR and moderate to severe MR  - NYHA Class IIIb. Markedly fluid overloaded on admit.  - Volume status now much improved. Weight down 24 pounds - Still with some LE edema but with AKI will hold diuretics one more day. Resume po tomorrow.  - plan DCCV tomorrow - GDMT limited by CKD IIIb  - Continue Farxiga 10 mg daily - Continue hydralazine 25 mg tid (will hold today with low BP) - Continue Imdur 30 mg  (will hold today with low BP) - Continue Toprol XL 25 mg daily. May need to hold if development of low output  - Intolerant to Entresto in the past (hives) - No Spiro w/ CKD  - ordered cMRI but pt has "buckshot pellets" throughout chest -> not be able to complete - continue Unna boots    2. Atrial Fibrillation w/ RVR  - Continue amio gtt - Now on eliquis 5 mg BID - keep K > 4.0 and Mg > 2.0  - DCCV tomorrow - NPO after MN   3. AKI on CKD IIIb - b/l SCr 1.8  - SCr 2.18 -> 2.6 -> 2.6 today. Due to overdiuresis.  - Followed by Dr. Bhutani  - hold diuretics again today   4. Mitral Regurgitation  - mod- severe on 3/24 - suspect functional - HF and afib optimization per above    5. Cognitive Impairment  - per internal medicine - post discharge, would benefit from paramedicine to help w/ compliance    6. Hypothyroidism - TSH 13.3. He is on amiodarone. - Free T4 1.26 - Continue synthroid - per internal medicine    7. Type 2 DM - SSI  - Now on Farxiga   8. TTP - Followed by Dr. Katragadda    Length of Stay: 4  Roshelle Traub, MD  05/11/2022, 10:06 AM  Advanced Heart Failure Team Pager [phone] (M-F; 7a - 5p)  Please contact [facility] for night-coverage after hours (5p -7a ) and weekends on [email or web address]    

## 2022-05-12 ENCOUNTER — Encounter (HOSPITAL_COMMUNITY)
Admission: EM | Disposition: A | Discharge status: Home / Self Care | Payer: Self-pay | Source: Home / Self Care | Attending: Internal Medicine

## 2022-05-12 ENCOUNTER — Inpatient Hospital Stay (HOSPITAL_COMMUNITY): Payer: 59 | Admitting: Certified Registered Nurse Anesthetist

## 2022-05-12 DIAGNOSIS — I509 Heart failure, unspecified: Secondary | ICD-10-CM | POA: Diagnosis not present

## 2022-05-12 DIAGNOSIS — I130 Hypertensive heart and chronic kidney disease with heart failure and stage 1 through stage 4 chronic kidney disease, or unspecified chronic kidney disease: Secondary | ICD-10-CM | POA: Diagnosis not present

## 2022-05-12 DIAGNOSIS — N189 Chronic kidney disease, unspecified: Secondary | ICD-10-CM

## 2022-05-12 DIAGNOSIS — D631 Anemia in chronic kidney disease: Secondary | ICD-10-CM

## 2022-05-12 DIAGNOSIS — I4892 Unspecified atrial flutter: Secondary | ICD-10-CM | POA: Diagnosis not present

## 2022-05-12 DIAGNOSIS — I5023 Acute on chronic systolic (congestive) heart failure: Secondary | ICD-10-CM | POA: Diagnosis not present

## 2022-05-12 DIAGNOSIS — I4891 Unspecified atrial fibrillation: Secondary | ICD-10-CM | POA: Diagnosis not present

## 2022-05-12 DIAGNOSIS — E1122 Type 2 diabetes mellitus with diabetic chronic kidney disease: Secondary | ICD-10-CM

## 2022-05-12 HISTORY — PX: CARDIOVERSION: SHX1299

## 2022-05-12 LAB — BASIC METABOLIC PANEL
Anion gap: 14 (ref 5–15)
BUN: 43 mg/dL — ABNORMAL HIGH (ref 8–23)
CO2: 30 mmol/L (ref 22–32)
Calcium: 8.5 mg/dL — ABNORMAL LOW (ref 8.9–10.3)
Chloride: 89 mmol/L — ABNORMAL LOW (ref 98–111)
Creatinine, Ser: 2.55 mg/dL — ABNORMAL HIGH (ref 0.61–1.24)
GFR, Estimated: 26 mL/min — ABNORMAL LOW (ref >60)
Glucose, Bld: 202 mg/dL — ABNORMAL HIGH (ref 70–99)
Potassium: 3.4 mmol/L — ABNORMAL LOW (ref 3.5–5.1)
Sodium: 133 mmol/L — ABNORMAL LOW (ref 135–145)

## 2022-05-12 LAB — GLUCOSE, CAPILLARY
Glucose-Capillary: 112 mg/dL — ABNORMAL HIGH (ref 70–99)
Glucose-Capillary: 105 mg/dL — ABNORMAL HIGH (ref 70–99)
Glucose-Capillary: 98 mg/dL (ref 70–99)
Glucose-Capillary: 114 mg/dL — ABNORMAL HIGH (ref 70–99)

## 2022-05-12 LAB — MAGNESIUM: Magnesium: 2.2 mg/dL (ref 1.7–2.4)

## 2022-05-12 SURGERY — CARDIOVERSION
Anesthesia: General

## 2022-05-12 MED ORDER — PROPOFOL 10 MG/ML IV BOLUS
INTRAVENOUS | Status: DC | PRN
  Administered 2022-05-12: 70 mg via INTRAVENOUS

## 2022-05-12 MED ORDER — LIDOCAINE 2% (20 MG/ML) 5 ML SYRINGE
INTRAMUSCULAR | Status: DC | PRN
  Administered 2022-05-12: 40 mg via INTRAVENOUS

## 2022-05-12 MED ORDER — PHENYLEPHRINE 80 MCG/ML (10ML) SYRINGE FOR IV PUSH (FOR BLOOD PRESSURE SUPPORT)
PREFILLED_SYRINGE | INTRAVENOUS | Status: DC | PRN
  Administered 2022-05-12: 160 ug via INTRAVENOUS

## 2022-05-12 MED ORDER — POTASSIUM CHLORIDE CRYS ER 20 MEQ PO TBCR
40.0000 meq | EXTENDED_RELEASE_TABLET | Freq: Two times a day (BID) | ORAL | Status: AC
  Administered 2022-05-12 (×2): 40 meq via ORAL
  Filled 2022-05-12 (×2): qty 2

## 2022-05-12 MED ORDER — FUROSEMIDE 40 MG PO TABS
40.0000 mg | ORAL_TABLET | Freq: Every day | ORAL | Status: DC
  Administered 2022-05-12 – 2022-05-13 (×2): 40 mg via ORAL
  Filled 2022-05-12 (×2): qty 1

## 2022-05-12 SURGICAL SUPPLY — 1 items: ELECT DEFIB PAD ADLT CADENCE (PAD) ×2 IMPLANT

## 2022-05-12 NOTE — Interval H&P Note (Signed)
History and Physical Interval Note:  05/12/2022 1:52 PM  Phillip Wilson  has presented today for surgery, with the diagnosis of AFLUTTER.  The various methods of treatment have been discussed with the patient and family. After consideration of risks, benefits and other options for treatment, the patient has consented to  Procedure(s): CARDIOVERSION (N/A) as a surgical intervention.  The patient's history has been reviewed, patient examined, no change in status, stable for surgery.  I have reviewed the patient's chart and labs.  Questions were answered to the patient's satisfaction.     Maneh Sieben

## 2022-05-12 NOTE — Transfer of Care (Signed)
Immediate Anesthesia Transfer of Care Note  Patient: Phillip Wilson  Procedure(s) Performed: CARDIOVERSION  Patient Location: PACU and Cath Lab  Anesthesia Type:MAC  Level of Consciousness: patient cooperative and responds to stimulation  Airway & Oxygen Therapy: Patient Spontanous Breathing  Post-op Assessment: Report given to RN and Post -op Vital signs reviewed and stable  Post vital signs: Reviewed and stable  Last Vitals:  Vitals Value Taken Time  BP 120/67   Temp /   Pulse 48 05/12/22 1453  Resp 11 05/12/22 1453  SpO2 99% 05/12/22 1453    Last Pain:  Vitals:   05/12/22 1305  TempSrc: Temporal  PainSc: 0-No pain      Patients Stated Pain Goal: 0 (05/08/22 2130)  Complications: No notable events documented.

## 2022-05-12 NOTE — Anesthesia Preprocedure Evaluation (Signed)
Anesthesia Evaluation  Patient identified by MRN, date of birth, ID band Patient awake    Reviewed: Allergy & Precautions, H&P , NPO status , Patient's Chart, lab work & pertinent test results, reviewed documented beta blocker date and time   Airway Mallampati: II  TM Distance: >3 FB Neck ROM: full    Dental no notable dental hx.    Pulmonary neg pulmonary ROS   Pulmonary exam normal breath sounds clear to auscultation       Cardiovascular Exercise Tolerance: Good hypertension, Pt. on medications +CHF  + dysrhythmias Atrial Fibrillation  Rhythm:irregular Rate:Bradycardia     Neuro/Psych negative neurological ROS  negative psych ROS   GI/Hepatic Neg liver ROS, hiatal hernia,GERD  Medicated,,  Endo/Other  negative endocrine ROSdiabetes    Renal/GU CRFRenal disease  negative genitourinary   Musculoskeletal  (+) Arthritis , Osteoarthritis,    Abdominal   Peds  Hematology  (+) Blood dyscrasia, anemia   Anesthesia Other Findings   Reproductive/Obstetrics negative OB ROS                             Anesthesia Physical Anesthesia Plan  ASA: 4  Anesthesia Plan: General   Post-op Pain Management: Minimal or no pain anticipated   Induction: Intravenous  PONV Risk Score and Plan: 2 and Propofol infusion  Airway Management Planned: Mask  Additional Equipment:   Intra-op Plan:   Post-operative Plan:   Informed Consent: I have reviewed the patients History and Physical, chart, labs and discussed the procedure including the risks, benefits and alternatives for the proposed anesthesia with the patient or authorized representative who has indicated his/her understanding and acceptance.     Dental Advisory Given  Plan Discussed with: CRNA  Anesthesia Plan Comments:         Anesthesia Quick Evaluation

## 2022-05-12 NOTE — Progress Notes (Signed)
PROGRESS NOTE    Phillip Wilson  YFR:[id number] DOB: 05-30-1945 DOA: 05/07/2022 PCP: Benita Stabile, MD  77/M with history of chronic systolic CHF, EF 67%, paroxysmal A-fib on Eliquis, CKD 3B, type 2 diabetes mellitus, hypertension, chronic thrombocytopenia presented to heart failure clinic 4/10 noted to be grossly volume overloaded with A-fib RVR, sent to the ED for further management. -In the ER noted to be in A-fib RVR, creatinine 2.0, hemoglobin 12.5, BNP 3386, troponin 26, chest x-ray noted cardiomegaly pulmonary vascular congestion -Admitted, started on diuretics and amiodarone gtt., CHF team following  Subjective: -Feels well, denies any complaints, ambulating in the halls, remains in A-fib  Assessment and Plan:  Acute on chronic HFrEF  EF 25% by TTE 04/19/2022.   -CHF team following, diuresed well with IV Lasix, he is 11.6 L negative -BP dropped, off hydralazine and Imdur, Lasix held this weekend  -Continue Farxiga and Toprol  -Resume diuretics tomorrow -GDMT limited by CKD -Discharge planning  Paroxysmal atrial fibrillation with RVR -Now on IV amiodarone, Toprol -Eliquis on hold, continue IV heparin  -Plan for cardioversion on Monday  Cognitive impairment -Started Synthroid, TSH and B12 unremarkable  Chronic kidney disease, stage 3b -Mild bump in creatinine this weekend, appears to be stabilizing, avoid hypotension  Type 2 diabetes mellitus  -Stable, last HbA1c was 6.5  Elevated serum free T4 level Mildly elevated free T4 1.18, TSH was 13.376 on 04/21/2022.  On chronic amiodarone.  -Repeat TSH was elevated at 11.8, started Synthroid 75 mcg this admission, will need follow-up labs and further titration  Thrombocytopenia Mild and stable.  DVT prophylaxis: IV heparin Code Status: Full code Family Communication: None present Disposition Plan: Home pending improvement in volume status  Consultants: CHF team   Procedures:   Antimicrobials:     Objective: Vitals:   05/11/22 2000 05/11/22 2322 05/12/22 0438 05/12/22 0736  BP: (!) 152/71 105/65 100/78 (!) 125/104  Pulse: 96 94 95 87  Resp: 18 18 20    Temp: (!) 97.3 F (36.3 C) 98.2 F (36.8 C) 97.9 F (36.6 C) 97.7 F (36.5 C)  TempSrc: Oral Oral Oral Oral  SpO2:  94% 92% 97%  Weight:   88 kg   Height:        Intake/Output Summary (Last 24 hours) at 05/12/2022 1021 Last data filed at 05/12/2022 0800 Gross per 24 hour  Intake 406.51 ml  Output 1200 ml  Net -793.49 ml   Filed Weights   05/10/22 0423 05/11/22 0405 05/12/22 0438  Weight: 89.8 kg 89.1 kg 88 kg    Examination:  General exam: Elderly frail chronically ill male sitting up in bed, AAOx3, no distress, cognitive deficits HEENT: No JVD CVS: S1-S2, irregularly irregular rhythm Lungs: Decreased breath sounds at the bases Abdomen: Soft, nontender, bowel sounds present Extremities: No edema, Unna boots on Skin: No rashes Psychiatry:  Mood & affect appropriate.     Data Reviewed:   CBC: Recent Labs  Lab 05/07/22 1548 05/07/22 1644 05/08/22 0437 05/09/22 0002  WBC 4.6 5.2 5.2 5.8  NEUTROABS  --  4.0  --   --   HGB 13.1 12.5* 12.4* 13.1  HCT 41.6 39.3 40.1 41.3  MCV 84.2 84.5 86.2 83.3  PLT 107* 102* 100* 114*   Basic Metabolic Panel: Recent Labs  Lab 05/07/22 1548 05/07/22 1644 05/08/22 0437 05/09/22 0002 05/10/22 0005 05/11/22 0015 05/12/22 0025  NA 140   < > 138 137 135 134* 133*  K 3.8   < > 5.1  3.1* 4.5 4.0 3.4*  CL 97*   < > 93* 94* 90* 88* 89*  CO2 30   < > 29 31 34* 32 30  GLUCOSE 119*   < > 137* 115* 114* 109* 202*  BUN 29*   < > 32* 33* 37* 42* 43*  CREATININE 2.15*   < > 2.13* 2.18* 2.61* 2.56* 2.55*  CALCIUM 9.2   < > 8.6* 8.6* 9.2 8.9 8.5*  MG 2.0  --  1.9  --  2.0  --  2.2   < > = values in this interval not displayed.   GFR: Estimated Creatinine Clearance: 27 mL/min (A) (by C-G formula based on SCr of 2.55 mg/dL (H)). Liver Function Tests: Recent Labs  Lab  05/07/22 1548  AST 42*  ALT 19  ALKPHOS 192*  BILITOT 3.4*  PROT 6.6  ALBUMIN 3.3*   No results for input(s): "LIPASE", "AMYLASE" in the last 168 hours. No results for input(s): "AMMONIA" in the last 168 hours. Coagulation Profile: Recent Labs  Lab 05/11/22 2052  INR 1.4*   Cardiac Enzymes: No results for input(s): "CKTOTAL", "CKMB", "CKMBINDEX", "TROPONINI" in the last 168 hours. BNP (last 3 results) No results for input(s): "PROBNP" in the last 8760 hours. HbA1C: No results for input(s): "HGBA1C" in the last 72 hours. CBG: Recent Labs  Lab 05/11/22 0751 05/11/22 1134 05/11/22 1751 05/11/22 2125 05/12/22 0600  GLUCAP 158* 118* 128* 103* 112*   Lipid Profile: No results for input(s): "CHOL", "HDL", "LDLCALC", "TRIG", "CHOLHDL", "LDLDIRECT" in the last 72 hours. Thyroid Function Tests: No results for input(s): "TSH", "T4TOTAL", "FREET4", "T3FREE", "THYROIDAB" in the last 72 hours.  Anemia Panel: Recent Labs    05/11/22 1135  VITAMINB12 756   Urine analysis:    Component Value Date/Time   COLORURINE YELLOW 01/28/2018 1123   APPEARANCEUR CLEAR 01/28/2018 1123   LABSPEC 1.015 01/28/2018 1123   PHURINE 7.0 01/28/2018 1123   GLUCOSEU NEGATIVE 01/28/2018 1123   HGBUR 2+ (A) 01/28/2018 1123   HGBUR negative 02/14/2008 1349   BILIRUBINUR NEGATIVE 01/11/2018 0636   KETONESUR NEGATIVE 01/28/2018 1123   PROTEINUR NEGATIVE 01/28/2018 1123   UROBILINOGEN 1.0 01/21/2011 1246   NITRITE NEGATIVE 01/28/2018 1123   LEUKOCYTESUR NEGATIVE 01/28/2018 1123   Sepsis Labs: (procalcitonin:4,lacticidven:4)  ) Recent Results (from the past 240 hour(s))  MRSA Next Gen by PCR, Nasal     Status: Abnormal   Collection Time: 05/08/22  1:54 PM   Specimen: Nasal Mucosa; Nasal Swab  Result Value Ref Range Status   MRSA by PCR Next Gen DETECTED (A) NOT DETECTED Final    Comment: RESULT CALLED TO, READ BACK BY AND VERIFIED WITH: RN Nyra Market [phone] @ 1743 FH (NOTE) The  GeneXpert MRSA Assay (FDA approved for NASAL specimens only), is one component of a comprehensive MRSA colonization surveillance program. It is not intended to diagnose MRSA infection nor to guide or monitor treatment for MRSA infections. Test performance is not FDA approved in patients less than 33 years old. Performed at [facility], [address], [place], [place] [address]      Radiology Studies: No results found.   Scheduled Meds:  allopurinol  50 mg Oral Daily   apixaban  5 mg Oral BID   atorvastatin  20 mg Oral Daily   dapagliflozin propanediol  10 mg Oral Daily   furosemide  40 mg Oral Daily   insulin aspart  0-9 Units Subcutaneous TID WC   levothyroxine  75 mcg  Oral Q0600   metoprolol succinate  25 mg Oral Daily   mupirocin ointment  1 Application Nasal BID   potassium chloride  40 mEq Oral BID   sodium chloride flush  3 mL Intravenous Q12H   tamsulosin  0.4 mg Oral Daily   Continuous Infusions:  sodium chloride     amiodarone 30 mg/hr (05/11/22 2238)     LOS: 5 days    Time spent:    Zannie Cove, MD [facility]   05/12/2022, 10:21 AM
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
## 2022-05-12 NOTE — CV Procedure (Signed)
   DIRECT CURRENT CARDIOVERSION  NAME:  Phillip Wilson    MRN: [id number] DOB:  08-25-1945    ADMIT DATE: 05/07/2022  INDICATIONS: Symptomatic atrial fibrillation  CARDIOVERSION:     Indications:  Symptomatic Atrial Fibrillation  Procedure Details:  The patient had the defibrillator pads placed in the anterior and posterior position. Once an appropriate level of sedation was confirmed, the patient was cardioverted x 1 with 200J of biphasic synchronized energy.  The patient converted to NSR.  There were no apparent complications.  The patient had normal neuro status and respiratory status post procedure with vitals stable as recorded elsewhere.  Adequate airway was maintained throughout and vital signs monitored per protocol.  Cruz Devilla Advanced Heart Failure 5:20 PM

## 2022-05-13 ENCOUNTER — Encounter (HOSPITAL_COMMUNITY): Payer: Self-pay | Admitting: Cardiology

## 2022-05-13 ENCOUNTER — Other Ambulatory Visit (HOSPITAL_COMMUNITY): Payer: Self-pay

## 2022-05-13 DIAGNOSIS — I5023 Acute on chronic systolic (congestive) heart failure: Secondary | ICD-10-CM | POA: Diagnosis not present

## 2022-05-13 DIAGNOSIS — I4891 Unspecified atrial fibrillation: Secondary | ICD-10-CM | POA: Diagnosis not present

## 2022-05-13 LAB — CBC
HCT: 39.9 % (ref 39.0–52.0)
Hemoglobin: 12.9 g/dL — ABNORMAL LOW (ref 13.0–17.0)
MCH: 26.4 pg (ref 26.0–34.0)
MCHC: 32.3 g/dL (ref 30.0–36.0)
MCV: 81.8 fL (ref 80.0–100.0)
Platelets: 136 10*3/uL — ABNORMAL LOW (ref 150–400)
RBC: 4.88 MIL/uL (ref 4.22–5.81)
RDW: 16.4 % — ABNORMAL HIGH (ref 11.5–15.5)
WBC: 5.6 10*3/uL (ref 4.0–10.5)
nRBC: 0 % (ref 0.0–0.2)

## 2022-05-13 LAB — BASIC METABOLIC PANEL
Anion gap: 14 (ref 5–15)
BUN: 38 mg/dL — ABNORMAL HIGH (ref 8–23)
CO2: 27 mmol/L (ref 22–32)
Calcium: 9 mg/dL (ref 8.9–10.3)
Chloride: 94 mmol/L — ABNORMAL LOW (ref 98–111)
Creatinine, Ser: 2.4 mg/dL — ABNORMAL HIGH (ref 0.61–1.24)
GFR, Estimated: 27 mL/min — ABNORMAL LOW (ref >60)
Glucose, Bld: 93 mg/dL (ref 70–99)
Potassium: 3.8 mmol/L (ref 3.5–5.1)
Sodium: 135 mmol/L (ref 135–145)

## 2022-05-13 LAB — GLUCOSE, CAPILLARY: Glucose-Capillary: 100 mg/dL — ABNORMAL HIGH (ref 70–99)

## 2022-05-13 MED ORDER — POTASSIUM CHLORIDE CRYS ER 20 MEQ PO TBCR
40.0000 meq | EXTENDED_RELEASE_TABLET | Freq: Every day | ORAL | 0 refills | Status: DC
  Filled 2022-05-13: qty 60, 30d supply, fill #0

## 2022-05-13 MED ORDER — POTASSIUM CHLORIDE CRYS ER 20 MEQ PO TBCR
40.0000 meq | EXTENDED_RELEASE_TABLET | Freq: Every day | ORAL | Status: DC
  Administered 2022-05-13: 40 meq via ORAL
  Filled 2022-05-13: qty 2

## 2022-05-13 MED ORDER — DAPAGLIFLOZIN PROPANEDIOL 10 MG PO TABS
10.0000 mg | ORAL_TABLET | Freq: Every day | ORAL | 0 refills | Status: DC
  Filled 2022-05-13: qty 30, 30d supply, fill #0

## 2022-05-13 MED ORDER — LEVOTHYROXINE SODIUM 75 MCG PO TABS
75.0000 ug | ORAL_TABLET | Freq: Every day | ORAL | 0 refills | Status: DC
  Filled 2022-05-13: qty 30, 30d supply, fill #0

## 2022-05-13 MED ORDER — METOPROLOL SUCCINATE ER 25 MG PO TB24: 25.0000 mg | ORAL_TABLET | Freq: Every day | ORAL | Status: DC

## 2022-05-13 MED ORDER — AMIODARONE HCL 200 MG PO TABS
200.0000 mg | ORAL_TABLET | Freq: Every day | ORAL | Status: DC
  Administered 2022-05-13: 200 mg via ORAL
  Filled 2022-05-13: qty 1

## 2022-05-13 NOTE — Anesthesia Postprocedure Evaluation (Signed)
Anesthesia Post Note  Patient: Phillip Wilson  Procedure(s) Performed: CARDIOVERSION     Patient location during evaluation: PACU Anesthesia Type: General Level of consciousness: awake and alert Pain management: pain level controlled Vital Signs Assessment: post-procedure vital signs reviewed and stable Respiratory status: spontaneous breathing, nonlabored ventilation, respiratory function stable and patient connected to nasal cannula oxygen Cardiovascular status: blood pressure returned to baseline and stable Postop Assessment: no apparent nausea or vomiting Anesthetic complications: no   No notable events documented.  Last Vitals:  Vitals:   05/13/22 0548 05/13/22 0815  BP:  112/69  Pulse: (!) 51 (!) 56  Resp: 18 20  Temp: 36.8 C 36.8 C  SpO2: 92% 96%    Last Pain:  Vitals:   05/13/22 0815  TempSrc: Oral  PainSc:                  Kennieth Rad

## 2022-05-13 NOTE — Consult Note (Signed)
   [facility] CM Inpatient Consult   05/13/2022  Phillip Wilson 09/10/1945 [id number]  [facility] [[facility]]  Accountable Care Organization [ACO] Patient: Phillip Wilson Medicare  Primary Care Provider:  Benita Stabile, MD   Patient is currently active with [facility] [[facility]] Care Management for chronic disease management services.  Patient has been engaged by a [facility] Geriatric NP.  Our community based plan of care has focused on disease management and community resource support.    Patient will receive a post hospital call and will be evaluated for assessments and disease process education.    Plan:  [facility] Ger[facility]-NP was notified on 05/08/22 of admission and will continue to follow.  Will update on follow up needs as known.  Of note, [facility] Care Management services does not replace or interfere with any services that are needed or arranged by inpatient [facility] care management team.   For additional questions or referrals please contact:  Charlesetta Shanks, RN BSN CCM [facility]  [phone] business mobile phone Toll free office [phone]  *Concierge Line  [phone] Fax number: [phone] [email or web address]
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
## 2022-05-13 NOTE — TOC Progression Note (Addendum)
Transition of Care [facility]) - Progression Note    Patient Details  Name: Phillip Wilson MRN: [id number] Date of Birth: 17-May-1945  Transition of Care [facility]) CM/SW Contact  Elliot Cousin, RN Phone Number: [phone] 05/13/2022, 11:35 AM  Clinical Narrative:    CM spoke to pt and provided pt with scale and pill planner. States his sister will provide transportation home. Meds to come up from [facility] pharmacy.   Appt with Dr [name], PCP hospital follow up on 4/29 at 940 am  Expected Discharge Plan: Home w Home Health Services Barriers to Discharge: No Barriers Identified  Expected Discharge Plan and Services   Discharge Planning Services: CM Consult   Living arrangements for the past 2 months: Single Family Home Expected Discharge Date: 05/13/22                       Social Determinants of Health (SDOH) Interventions SDOH Screenings   Food Insecurity: Food Insecurity Present (05/08/2022)  Housing: Low Risk  (05/08/2022)  Transportation Needs: No Transportation Needs (05/08/2022)  Utilities: Not At Risk (05/08/2022)  Alcohol Screen: Low Risk  (05/07/2022)  Depression (PHQ2-9): Low Risk  (05/07/2022)  Financial Resource Strain: Low Risk  (05/07/2022)  Physical Activity: Inactive (05/07/2022)  Social Connections: Socially Isolated (05/07/2022)  Stress: No Stress Concern Present (05/07/2022)  Tobacco Use: Low Risk  (05/13/2022)    Readmission Risk Interventions     No data to display

## 2022-05-13 NOTE — Progress Notes (Signed)
Advanced Heart Failure Rounding Note  PCP-Cardiologist: None   Subjective:    Remains on amiodarone gtt, s/p DCCV yesterday. Remains in NSR.   Diuresed very well. Weight down 30 pounds total. PO lasix restarted yesterday.   Scr stabilizing 2.1 -> 2.6 -> 2.6-> 2.55-> 2.4  Feels great this morning, sitting in cahir. Denies CP/SOB  Objective:   Weight Range: 86.2 kg Body mass index is 28.89 kg/m.   Vital Signs:   Temp:  [97.7 F (36.5 C)-98.3 F (36.8 C)] 98.3 F (36.8 C) (04/16 0548) Pulse Rate:  [46-115] 51 (04/16 0548) Resp:  [10-24] 18 (04/16 0548) BP: (101-148)/(65-104) 104/67 (04/15 1948) SpO2:  [92 %-100 %] 92 % (04/16 0548) Weight:  [86.2 kg] 86.2 kg (04/16 0548) Last BM Date : 05/09/22  Weight change: Filed Weights   05/11/22 0405 05/12/22 0438 05/13/22 0548  Weight: 89.1 kg 88 kg 86.2 kg    Intake/Output:   Intake/Output Summary (Last 24 hours) at 05/13/2022 0727 Last data filed at 05/13/2022 0549 Gross per 24 hour  Intake 1270.08 ml  Output 1350 ml  Net -79.92 ml    Physical Exam    General:  well appearing.  No respiratory difficulty HEENT: normal Neck: supple. JVD ~7 cm. Carotids 2+ bilat; no bruits. No lymphadenopathy or thyromegaly appreciated. Cor: PMI nondisplaced. Regular rate & rhythm. No rubs, gallops or murmurs. Lungs: clear Abdomen: soft, nontender, nondistended. No hepatosplenomegaly. No bruits or masses. Good bowel sounds. Extremities: no cyanosis, clubbing, rash, edema  Neuro: alert & oriented x 3, cranial nerves grossly intact. moves all 4 extremities w/o difficulty. Affect pleasant.   Telemetry   NSR 60s (Personally reviewed)    Labs    CBC Recent Labs    05/13/22 0007  WBC 5.6  HGB 12.9*  HCT 39.9  MCV 81.8  PLT 136*   Basic Metabolic Panel Recent Labs    16/10/96 0025 05/13/22 0007  NA 133* 135  K 3.4* 3.8  CL 89* 94*  CO2 30 27  GLUCOSE 202* 93  BUN 43* 38*  CREATININE 2.55* 2.40*  CALCIUM 8.5* 9.0   MG 2.2  --    Liver Function Tests No results for input(s): "AST", "ALT", "ALKPHOS", "BILITOT", "PROT", "ALBUMIN" in the last 72 hours.  No results for input(s): "LIPASE", "AMYLASE" in the last 72 hours. Cardiac Enzymes No results for input(s): "CKTOTAL", "CKMB", "CKMBINDEX", "TROPONINI" in the last 72 hours.  BNP: BNP (last 3 results) Recent Labs    04/18/22 0936 05/07/22 1548 05/07/22 1644  BNP 2,367.0* 0,454.0* 3,386.5*   ProBNP (last 3 results) No results for input(s): "PROBNP" in the last 8760 hours.  D-Dimer No results for input(s): "DDIMER" in the last 72 hours. Hemoglobin A1C No results for input(s): "HGBA1C" in the last 72 hours. Fasting Lipid Panel No results for input(s): "CHOL", "HDL", "LDLCALC", "TRIG", "CHOLHDL", "LDLDIRECT" in the last 72 hours. Thyroid Function Tests No results for input(s): "TSH", "T4TOTAL", "T3FREE", "THYROIDAB" in the last 72 hours.  Invalid input(s): "FREET3"  Other results:  Imaging   EP STUDY  Result Date: 05/12/2022 See surgical note for result.   Medications:     Scheduled Medications:  allopurinol  50 mg Oral Daily   apixaban  5 mg Oral BID   atorvastatin  20 mg Oral Daily   dapagliflozin propanediol  10 mg Oral Daily   furosemide  40 mg Oral Daily   insulin aspart  0-9 Units Subcutaneous TID WC   levothyroxine  75 mcg  Oral Q0600   metoprolol succinate  25 mg Oral Daily   mupirocin ointment  1 Application Nasal BID   sodium chloride flush  3 mL Intravenous Q12H   tamsulosin  0.4 mg Oral Daily   Infusions:  amiodarone 30 mg/hr (05/12/22 2308)    PRN Medications: acetaminophen **OR** acetaminophen, mouth rinse, senna-docusate  Patient Profile  77 y/o AAM w/ chronic biventricular heart failure, PAF, CKD IIIb, NSVT, HTN, MR, hypothyroidism, TTP and cognitive impairment, admitted w/ acute on chronic CHF w/ marked volume overload in the setting of atrial fibrillation w/ RVR.  Assessment/Plan  1. Acute on Chronic  Systolic Heart Failure - Long-standing cardiomyopathy of uncertain etiology (never had full workup of etiology) - Echo 9/21 with LV EF <15%, moderate-severe likely functional MR, and moderate RV dysfunction - Echo 3/24 EF 25%, severe LV dysfunction, mild LVH, RV severely reduced and enlarged, moderate TR and moderate to severe MR  - NYHA Class IIIb. Markedly fluid overloaded on admit.  - Volume status now much improved. Weight down 30 lbs - Continue 40 PO lasix daily - GDMT limited by CKD IIIb  - Continue Farxiga 10 mg daily - Hydralazine 25 mg tid & Imdur 30 mg  (continue to hold today with low BP) - Continue Toprol XL 25 mg daily. May need to hold if development of low output  - Intolerant to [place] in the past (hives) - No Cleda Daub w/ CKD  - ordered cMRI but pt has "buckshot pellets" throughout chest -> not able to complete - continue Unna boots    2. Atrial Fibrillation w/ RVR  - Stop amio gtt, transition to 200 daily - Now on eliquis 5 mg BID - keep K > 4.0 and Mg > 2.0  - s/p successful DCCV 4/15 to NSR   3. AKI on CKD IIIb - b/l SCr 1.8  - SCr 2.18 -> 2.6 -> 2.6> 2.55> 2.4 today. Improving.   - Followed by Dr. Wolfgang Wilson    4. Mitral Regurgitation  - mod- severe on 3/24 - suspect functional - HF and afib optimization per above    5. Cognitive Impairment  - per internal medicine - working on getting paramedicine to see him once discharged   6. Hypothyroidism - TSH 13.3. He is on amiodarone. - Free T4 1.26 - Continue synthroid - per internal medicine    7. Type 2 DM - SSI  - Now on Farxiga   8. TTP - Followed by Dr. Ellin Wilson   SW to see patient this morning for enrollment in paramedicine program. Otherwise stable for discharge pending AHF MD eval. Has f/u in clinic.   AHF meds at discharge: Amiodarone 200 mg daily Eliquis 5 mg BID Atorvastatin 20 mg daily Farxiga 10 mg daily Lasix 40 mg daily Metoprolol succinate 25 mg QHS Potassium 40 mEq daily  Length  of Stay: 6  Phillip Bleacher, NP  05/13/2022, 7:27 AM  Advanced Heart Failure Team Pager [phone] (M-F; 7a - 5p)  Please contact [facility] for night-coverage after hours (5p -7a ) and weekends on [email or web address]

## 2022-05-14 ENCOUNTER — Other Ambulatory Visit: Payer: Self-pay | Admitting: Physician Assistant

## 2022-05-15 ENCOUNTER — Telehealth (HOSPITAL_COMMUNITY): Payer: Self-pay | Admitting: Licensed Clinical Social Worker

## 2022-05-15 NOTE — Telephone Encounter (Signed)
H&V Care Navigation CSW Progress Note  Clinical Social Worker consulted to refer pt to [employer].  CSW spoke with pt who is agreeable to referral.  Pt is [facility] resident so referral for [employer] sent in.   SDOH Screenings   Food Insecurity: Food Insecurity Present (05/08/2022)  Housing: Low Risk  (05/08/2022)  Transportation Needs: No Transportation Needs (05/08/2022)  Utilities: Not At Risk (05/08/2022)  Alcohol Screen: Low Risk  (05/07/2022)  Depression (PHQ2-9): Low Risk  (05/07/2022)  Financial Resource Strain: Low Risk  (05/07/2022)  Physical Activity: Inactive (05/07/2022)  Social Connections: Socially Isolated (05/07/2022)  Stress: No Stress Concern Present (05/07/2022)  Tobacco Use: Low Risk  (05/13/2022)   Burna Sis, LCSW Clinical Social Worker Advanced Heart Failure Clinic Desk#: [phone] Cell#: [phone]

## 2022-05-21 ENCOUNTER — Encounter: Payer: Self-pay | Admitting: *Deleted

## 2022-05-21 ENCOUNTER — Telehealth: Payer: Self-pay | Admitting: *Deleted

## 2022-05-21 NOTE — Progress Notes (Signed)
PCP: Dr. Jeanice Lim Cardiology: [facility], [name] HF Cardiology: Dr. Shirlee Latch  77 y.o. with history of cardiomyopathy (never had full workup of etiology), HTN, CKD, paroxysmal atrial fibrillation and NSVT was referred by Randall An, PA, for evaluation of CHF. Much of history was derived from the patient's sister.  The patient was not sure what medications he was taking but his sister had the bottles.  He has had a cardiomyopathy since at least 2012, echo at that time showed EF 30-35%.  EF has been low since that time, most recent echo was in 9/21 with LV EF <15%, moderate-severe likely functional MR, and moderate RV dysfunction.  He has a history of NSVT and is on amiodarone.  He has never had a full workup for etiology of cardiomyopathy (no cath, cMRI, etc).  The prior notes report significant noncompliance with cardiology followup though he seems to be taking his medications appropriately with the help of his sister.  He was never implanted with an ICD due to noncompliance per the notes. He has not been able to take Entresto due to hives.  His last hospitalization was in 9/21 at [facility] with CHF and atrial fibrillation/RVR.  He had cardiogenic shock and was on norepinephrine transiently.  He was cardioverted during that hospitalization.   Last seen in AHF 5/22, work up to investigate CM initiated with [facility] and cMRI. However, he called and cancelled all of his testing and follow ups.   Admitted 3/24 with a/c systolic HF and rate controlled AF. Placed on amiodarone. Echo showed EF 25%, severe LV dysfunction, mild LVH, RV severely reduced and enlarged, moderate TR and moderate to severe MR. He was diuresed with IV lasix and GDMT titrated, but limited by AKI. DCCV not pursued due to questionable compliance with Eliquis. He was discharged home, weight 211 lbs.  Seen post hospital follow up 4/24, he was markedly overloaded and in AF with RVR. He was admitted from clinic, started on IV  amiodarone and diuresed with IV lasix. Underwent successful DCCV to NSR. Drips weaned and GDMT titrated. He as discharged home w/ paramedicine, weight 190 lbs.   Today he returns for post hospital HF follow up. Overall feeling fine.  He is not short of breath walking further distances up inclines. Denies palpitations, abnormal bleeding, CP, dizziness, edema, or PND/Orthopnea. Appetite ok. No fever or chills. Weight at home 191 pounds. Taking all medications, he does not know what medications he takes, his sister arranges his pillboxes.   ECG (personally reviewed): sinus bradycardia  Labs (6/21): creatinine 1.48 Labs (9/21): creatinine 3.1 Labs (3/22): K 3, creatinine 1.14 Labs (4/24): K 3.8, creatinine 2.40  PMH: 1. Cardiomyopathy: Presumed nonischemic  - Echo (2012) with EF 30-35% - Echo (6/17) with EF 15-20% - Echo (9/21) with EF < 15%, moderate-severe MR, moderate RV dilation with moderately decreased RV systolic function, PASP 56 mmHg.  - Echo (3/24): EF 25%, severe LV dysfunction, mild LVH, RV severely reduced and enlarged, moderate TR and moderate to severe MR.  - Hives with Entresto 2. H/o NSVT: On amiodarone, no ICD placed per notes due to poor compliance.  3. Mitral regurgitation: Moderate-severe on 9/21 and 3/24 echo, suspect functional. 4. HTN 5. Hyperlipidemia 6. Type 2 diabetes 7. CKD: Creatinine has fluctuated markedly over the last year, suspect CKD stage 3.  8. Gout 9. Atrial fibrillation: Paroxysmal.  DCCV in 12/19 and again in 9/21 during hospitalization at [facility].  - DCCV 4/24  Social History   Socioeconomic History  Marital status: Single    Spouse name: Not on file   Number of children: 2   Years of education: Not on file   Highest education level: Not on file  Occupational History   Occupation: Maintenance department    Comment: [employer]  Tobacco Use   Smoking status: Never   Smokeless tobacco: Never  Vaping Use   Vaping Use: Never used  Substance and  Sexual Activity   Alcohol use: Yes    Alcohol/week: 14.0 standard drinks of alcohol    Types: 4 Cans of beer, 10 Standard drinks or equivalent per week    Comment: stopped 2019   Drug use: No   Sexual activity: Yes  Other Topics Concern   Not on file  Social History Narrative   Not on file   Social Determinants of Health   Financial Resource Strain: Low Risk  (05/07/2022)   Overall Financial Resource Strain (CARDIA)    Difficulty of Paying Living Expenses: Not hard at all  Food Insecurity: Food Insecurity Present (05/08/2022)   Hunger Vital Sign    Worried About Running Out of Food in the Last Year: Sometimes true    Ran Out of Food in the Last Year: Sometimes true  Transportation Needs: No Transportation Needs (05/08/2022)   PRAPARE - Administrator, Civil Service (Medical): No    Lack of Transportation (Non-Medical): No  Physical Activity: Inactive (05/07/2022)   Exercise Vital Sign    Days of Exercise per Week: 0 days    Minutes of Exercise per Session: 0 min  Stress: No Stress Concern Present (05/07/2022)   [employer] of Occupational Health - Occupational Stress Questionnaire    Feeling of Stress : Not at all  Social Connections: Socially Isolated (05/07/2022)   Social Connection and Isolation Panel [NHANES]    Frequency of Communication with Friends and Family: More than three times a week    Frequency of Social Gatherings with Friends and Family: More than three times a week    Attends Religious Services: Never    Database administrator or Organizations: No    Attends Banker Meetings: Never    Marital Status: Never married  Intimate Partner Violence: Not At Risk (05/08/2022)   Humiliation, Afraid, Rape, and Kick questionnaire    Fear of Current or Ex-Partner: No    Emotionally Abused: No    Physically Abused: No    Sexually Abused: No   Family History  Problem Relation Age of Onset   Dementia Mother    COPD Father    Diabetes Father     Coronary artery disease Father    ROS: All systems reviewed and negative except as per HPI.   Current Outpatient Medications  Medication Sig Dispense Refill   acetaminophen (TYLENOL) 325 MG tablet Take 325-650 mg by mouth every 6 (six) hours as needed for headache, mild pain or moderate pain.     allopurinol (ZYLOPRIM) 100 MG tablet TAKE 1/2 (ONE-HALF) TABLET BY MOUTH ONCE DAILY FOR GOUT (Patient taking differently: Take 50 mg by mouth daily. TAKE 1/2 (ONE-HALF) TABLET BY MOUTH ONCE DAILY FOR GOUT) 30 tablet 0   amiodarone (PACERONE) 200 MG tablet Take 1 tablet (200 mg total) by mouth daily. 90 tablet 3   apixaban (ELIQUIS) 5 MG TABS tablet Take 1 tablet (5 mg total) by mouth 2 (two) times daily. 180 tablet 1   atorvastatin (LIPITOR) 20 MG tablet Take 20 mg by mouth daily.  calcitRIOL (ROCALTROL) 0.25 MCG capsule .25 mcg 3 times a week. Monday , Wednesday, and Friday (Patient taking differently: Take 0.25 mcg by mouth every Monday, Wednesday, and Friday. .25 mcg 3 times a week. Monday , Wednesday, and Friday) 45 capsule 1   dapagliflozin propanediol (FARXIGA) 10 MG TABS tablet Take 1 tablet (10 mg total) by mouth daily. 30 tablet 0   furosemide (LASIX) 40 MG tablet Take 1 tablet (40 mg total) by mouth daily. 30 tablet 0   levothyroxine (SYNTHROID) 75 MCG tablet Take 1 tablet (75 mcg total) by mouth daily at 6 (six) AM. 30 tablet 0   metoprolol succinate (TOPROL-XL) 25 MG 24 hr tablet TAKE 1 Tablet twice a day 180 tablet 1   potassium chloride SA (KLOR-CON M) 20 MEQ tablet Take 2 tablets (40 mEq total) by mouth daily. 60 tablet 0   tamsulosin (FLOMAX) 0.4 MG CAPS capsule Take 1 capsule (0.4 mg total) by mouth daily. 90 capsule 1   No current facility-administered medications for this encounter.   Wt Readings from Last 3 Encounters:  05/23/22 87.6 kg (193 lb 3.2 oz)  05/13/22 86.2 kg (190 lb 0.6 oz)  05/07/22 100 kg (220 lb 6.4 oz)   BP (!) 160/84   Pulse (!) 59   Wt 87.6 kg (193 lb  3.2 oz)   SpO2 96%   BMI 29.38 kg/m  Physical Exam General:  NAD. No resp difficulty, walked into clinic HEENT: Normal Neck: Supple. No JVD. Carotids 2+ bilat; no bruits. No lymphadenopathy or thryomegaly appreciated. Cor: PMI nondisplaced. Regular rate & rhythm. No rubs, gallops, 2/6 HSM apex Lungs: Clear Abdomen: Soft, nontender, nondistended. No hepatosplenomegaly. No bruits or masses. Good bowel sounds. Extremities: No cyanosis, clubbing, rash, edema Neuro: Alert & oriented x 3, cranial nerves grossly intact. Moves all 4 extremities w/o difficulty. Affect pleasant.  Assessment/Plan: 1. Chronic Systolic Heart Failure: Long-standing cardiomyopathy of uncertain etiology (never had full workup of etiology). Echo 9/21 with LV EF <15%, moderate-severe likely functional MR, and moderate RV dysfunction. This echo was apparently done in the setting of atrial fibrillation with RVR (at [facility]). As his cardiomyopathy pre-dates identification of atrial fibrillation, unlikely to have a tachy-mediated cardiomyopathy.  Echo 3/24 EF 25%, severe LV dysfunction, mild LVH, RV severely reduced and enlarged, moderate TR and moderate to severe MR. We have been unable to investigate etiology for his CM. He has not had R/LHC, unable to obtain cMRI as he has " "buckshot pellets" throughout chest. NYHA I-II, he is not volume overloaded today. GDMT limited by CKD IIIb. - Continue Lasix 40 mg daily + 40 KCL daily. BMET and BNP today. - Continue Farxiga 10 mg daily - Continue Toprol XL 25 mg bid. - Intolerant to [facility] in the past (hives) - No spiro w/ CKD.  - Plan to add back hydral/Imdur next visit, will not add today as he has not had his morning medications. - Plan to repeat echo in 3 months to look for EF improvement. 2. Atrial Fibrillation: Paroxysmal. Recent admission with atrial fibrillation with RVR. S/p DCCV to NSR (4/24). He is in sinus today on ECG. - Continue amiodarone 200 mg daily, plan to reduce to 100  mg daily next week. Will follow LFTs and TSH. He will need a regular eye exam while on amiodarone. - Continue Eliquis 5 mg bid. No bleeding issues. CBC today. 3.  CKD IIIb: Baseline SCr 1.8. He is followed by Dr. Wolfgang Phoenix. - Continue SGLT2i. - BMET today. 4. Mitral  Regurgitation: moderate to severe on recent echo. Suspect functional. - HF and afib optimization per above.  5. Hypothyroidism: TSH 13.3, free T4 1.26. He is on amiodarone. - Continue synthroid. 7. Type 2 DM: Continue SGLT2i 8. TTP: Followed by Dr. Ellin Saba  9. SDOH: He has been assigned to [facility] paramedicine.   Follow up in 3-4 weeks with APP and 12 weeks with Dr. Shirlee Latch + repeat echo.  Anderson [facility] FNP-BC 05/23/22

## 2022-05-21 NOTE — Transitions of Care (Post Inpatient/ED Visit) (Signed)
   05/21/2022  Name: Phillip Wilson MRN: [id number] DOB: 08/30/1945  Today's TOC FU Call Status: SUCCESSFUL  PT DISCHARGE WT 95 Kgms. On home scale provided by [facility] 191# when came home, now 194#   Transition Care Management Follow-up Telephone Call Date of Discharge: 05/13/22 Discharge Facility: [facility]) Type of Discharge: Inpatient Admission Primary Inpatient Discharge Diagnosis:: HF, AFIB How have you been since you were released from the hospital?: Better Any questions or concerns?: No  Items Reviewed: Do you have support at home?: Yes People in Home: alone Name of Support/Comfort Primary Source: Chrystal Brim - daughter  Home Care and Equipment/Supplies: Were Home Health Services Ordered?: No Any new equipment or medical supplies ordered?: No  Functional Questionnaire: Do you need assistance with bathing/showering or dressing?: No Do you need assistance with meal preparation?: No Do you need assistance with eating?: No Do you have difficulty maintaining continence: No Do you need assistance with getting out of bed/getting out of a chair/moving?: No Do you have difficulty managing or taking your medications?: No  Follow up appointments reviewed: PCP Follow-up appointment confirmed?: Yes (Pt will all today to make his appt with Dr. [name].) Date of PCP follow-up appointment?:  (TBA) Follow-up Provider: [facility] Follow-up appointment confirmed?: Yes Date of Specialist follow-up appointment?: 05/23/22 Follow-Up Specialty Provider:: [facility] Do you need transportation to your follow-up appointment?: No Do you understand care options if your condition(s) worsen?: Yes-patient verbalized understanding  Noralyn Pick C. Burgess Estelle, MSN, [facility] Gerontological Nurse Practitioner [facility] Care Management [phone]

## 2022-05-21 NOTE — Patient Outreach (Addendum)
  Care Coordination   Follow Up Visit Note   05/21/2022 Name: Phillip Wilson MRN: [id number] DOB: 10-03-1945  Phillip Wilson is a 77 y.o. year old male who sees [name], [name], MD for primary care. I spoke with  Phillip Wilson by phone today.  What matters to the patients health and wellness today?  AVOIDING HOSPITALIZATIONS.    Goals Addressed             This Visit's Progress    Follow HF Action Plan to avoid complications and hospitalizations.       Pt readmitted within 30 days for HF caused by AFIB with RVR. CURRENT WT194# NO SOB NO EDEMA. F/U WITH HF COINIC 05/23/22. PT TO MAKE HIS PCP APPT.  Interventions Today    Flowsheet Row Most Recent Value  Chronic Disease   Chronic disease during today's visit Congestive Heart Failure (CHF)  General Interventions   General Interventions Discussed/Reviewed General Interventions Discussed, General Interventions Reviewed, Doctor Visits  Doctor Visits Discussed/Reviewed Doctor Visits Discussed, Doctor Visits Reviewed  PCP/Specialist Visits Compliance with follow-up visit  Exercise Interventions   Exercise Discussed/Reviewed Physical Activity  Physical Activity Discussed/Reviewed Physical Activity Reviewed  Weight Management Weight maintenance  Education Interventions   Education Provided Provided Education  Provided Verbal Education On Medication, When to see the doctor  Nutrition Interventions   Nutrition Discussed/Reviewed Decreasing salt                 SDOH assessments and interventions completed:  Yes   PREVIOUSLY ASSESSED.  Care Coordination Interventions:  Yes, provided   Follow up plan: Follow up call scheduled for 1 WEEKS BY NEW RN CARE MANAGER, KIM GIBBS.    Encounter Outcome:  Pt. Visit Completed   Noralyn Pick C. Burgess Estelle, MSN, [facility] Gerontological Nurse Practitioner [facility] Care Management [phone]

## 2022-05-22 ENCOUNTER — Ambulatory Visit (HOSPITAL_COMMUNITY)
Admission: RE | Admit: 2022-05-22 | Discharge: 2022-05-22 | Disposition: A | Discharge status: Home / Self Care | Payer: 59 | Source: Ambulatory Visit | Attending: Nurse Practitioner | Admitting: Nurse Practitioner

## 2022-05-22 DIAGNOSIS — R945 Abnormal results of liver function studies: Secondary | ICD-10-CM | POA: Insufficient documentation

## 2022-05-22 DIAGNOSIS — K8020 Calculus of gallbladder without cholecystitis without obstruction: Secondary | ICD-10-CM | POA: Diagnosis not present

## 2022-05-23 ENCOUNTER — Ambulatory Visit (HOSPITAL_COMMUNITY)
Admit: 2022-05-23 | Discharge: 2022-05-23 | Disposition: A | Discharge status: Home / Self Care | Payer: 59 | Source: Ambulatory Visit | Attending: Family Medicine | Admitting: Family Medicine

## 2022-05-23 ENCOUNTER — Encounter (HOSPITAL_COMMUNITY): Payer: Self-pay

## 2022-05-23 VITALS — BP 160/84 | HR 59 | Wt 193.2 lb

## 2022-05-23 DIAGNOSIS — Z79890 Hormone replacement therapy (postmenopausal): Secondary | ICD-10-CM | POA: Insufficient documentation

## 2022-05-23 DIAGNOSIS — Z139 Encounter for screening, unspecified: Secondary | ICD-10-CM | POA: Diagnosis not present

## 2022-05-23 DIAGNOSIS — Z79899 Other long term (current) drug therapy: Secondary | ICD-10-CM | POA: Insufficient documentation

## 2022-05-23 DIAGNOSIS — N1832 Chronic kidney disease, stage 3b: Secondary | ICD-10-CM | POA: Diagnosis not present

## 2022-05-23 DIAGNOSIS — I5022 Chronic systolic (congestive) heart failure: Secondary | ICD-10-CM | POA: Diagnosis not present

## 2022-05-23 DIAGNOSIS — I340 Nonrheumatic mitral (valve) insufficiency: Secondary | ICD-10-CM

## 2022-05-23 DIAGNOSIS — I480 Paroxysmal atrial fibrillation: Secondary | ICD-10-CM | POA: Diagnosis not present

## 2022-05-23 DIAGNOSIS — I130 Hypertensive heart and chronic kidney disease with heart failure and stage 1 through stage 4 chronic kidney disease, or unspecified chronic kidney disease: Secondary | ICD-10-CM | POA: Diagnosis not present

## 2022-05-23 DIAGNOSIS — E039 Hypothyroidism, unspecified: Secondary | ICD-10-CM | POA: Diagnosis not present

## 2022-05-23 DIAGNOSIS — Z7901 Long term (current) use of anticoagulants: Secondary | ICD-10-CM | POA: Insufficient documentation

## 2022-05-23 DIAGNOSIS — Z7984 Long term (current) use of oral hypoglycemic drugs: Secondary | ICD-10-CM | POA: Diagnosis not present

## 2022-05-23 DIAGNOSIS — M3119 Other thrombotic microangiopathy: Secondary | ICD-10-CM | POA: Diagnosis not present

## 2022-05-23 DIAGNOSIS — E1122 Type 2 diabetes mellitus with diabetic chronic kidney disease: Secondary | ICD-10-CM | POA: Diagnosis not present

## 2022-05-23 LAB — BASIC METABOLIC PANEL
Anion gap: 10 (ref 5–15)
BUN: 35 mg/dL — ABNORMAL HIGH (ref 8–23)
CO2: 22 mmol/L (ref 22–32)
Calcium: 8.9 mg/dL (ref 8.9–10.3)
Chloride: 104 mmol/L (ref 98–111)
Creatinine, Ser: 1.94 mg/dL — ABNORMAL HIGH (ref 0.61–1.24)
GFR, Estimated: 35 mL/min — ABNORMAL LOW (ref >60)
Glucose, Bld: 115 mg/dL — ABNORMAL HIGH (ref 70–99)
Potassium: 4.3 mmol/L (ref 3.5–5.1)
Sodium: 136 mmol/L (ref 135–145)

## 2022-05-23 LAB — CBC
HCT: 41.6 % (ref 39.0–52.0)
Hemoglobin: 12.9 g/dL — ABNORMAL LOW (ref 13.0–17.0)
MCH: 26 pg (ref 26.0–34.0)
MCHC: 31 g/dL (ref 30.0–36.0)
MCV: 83.7 fL (ref 80.0–100.0)
Platelets: 120 10*3/uL — ABNORMAL LOW (ref 150–400)
RBC: 4.97 MIL/uL (ref 4.22–5.81)
RDW: 18 % — ABNORMAL HIGH (ref 11.5–15.5)
WBC: 5.6 10*3/uL (ref 4.0–10.5)
nRBC: 0 % (ref 0.0–0.2)

## 2022-05-23 LAB — BRAIN NATRIURETIC PEPTIDE: B Natriuretic Peptide: 1343.5 pg/mL — ABNORMAL HIGH (ref 0.0–100.0)

## 2022-05-23 NOTE — Patient Instructions (Signed)
Thank you for coming in today  Labs were done today, if any labs are abnormal the clinic will call you No news is good news  Medications: No changes   Follow up appointments:  Your physician recommends that you schedule a follow-up appointment in:  3 weeks in clinic   3 months with echocardiogram with Dr. Earlean Shawl will receive a reminder letter in the mail a few months in advance. If you don't receive a letter, please call our office to schedule the follow-up appointment.  Your physician has requested that you have an echocardiogram. Echocardiography is a painless test that uses sound waves to create images of your heart. It provides your doctor with information about the size and shape of your heart and how well your heart's chambers and valves are working. This procedure takes approximately one hour. There are no restrictions for this procedure.       Do the following things EVERYDAY: Weigh yourself in the morning before breakfast. Write it down and keep it in a log. Take your medicines as prescribed Eat low salt foods--Limit salt (sodium) to 2000 mg per day.  Stay as active as you can everyday Limit all fluids for the day to less than 2 liters   At the [facility], you and your health needs are our priority. As part of our continuing mission to provide you with exceptional heart care, we have created designated Provider Care Teams. These Care Teams include your primary Cardiologist (physician) and Advanced Practice Providers (APPs- Physician Assistants and Nurse Practitioners) who all work together to provide you with the care you need, when you need it.   You may see any of the following providers on your designated Care Team at your next follow up: Dr Arvilla Meres Dr Marca Ancona Dr. Marcos Eke, NP Robbie Lis, [place] [facility] [place], [place] Brynda Peon, NP Karle Plumber, PharmD   Please be sure to bring in all your  medications bottles to every appointment.    Thank you for choosing [facility]  If you have any questions or concerns before your next appointment please send [place] a message through [place] or call our office at [phone].    TO LEAVE A MESSAGE FOR THE NURSE SELECT OPTION 2, PLEASE LEAVE A MESSAGE INCLUDING: YOUR NAME DATE OF BIRTH CALL BACK NUMBER REASON FOR CALL**this is important as we prioritize the call backs  YOU WILL RECEIVE A CALL BACK THE SAME DAY AS LONG AS YOU CALL BEFORE 4:00 PM

## 2022-05-26 DIAGNOSIS — Z79899 Other long term (current) drug therapy: Secondary | ICD-10-CM | POA: Diagnosis not present

## 2022-05-26 DIAGNOSIS — I509 Heart failure, unspecified: Secondary | ICD-10-CM | POA: Diagnosis not present

## 2022-05-26 DIAGNOSIS — E039 Hypothyroidism, unspecified: Secondary | ICD-10-CM | POA: Diagnosis not present

## 2022-05-26 DIAGNOSIS — I130 Hypertensive heart and chronic kidney disease with heart failure and stage 1 through stage 4 chronic kidney disease, or unspecified chronic kidney disease: Secondary | ICD-10-CM | POA: Diagnosis not present

## 2022-05-26 DIAGNOSIS — I482 Chronic atrial fibrillation, unspecified: Secondary | ICD-10-CM | POA: Diagnosis not present

## 2022-05-26 DIAGNOSIS — N1832 Chronic kidney disease, stage 3b: Secondary | ICD-10-CM | POA: Diagnosis not present

## 2022-05-26 DIAGNOSIS — Z7901 Long term (current) use of anticoagulants: Secondary | ICD-10-CM | POA: Diagnosis not present

## 2022-05-26 DIAGNOSIS — R7303 Prediabetes: Secondary | ICD-10-CM | POA: Diagnosis not present

## 2022-05-28 ENCOUNTER — Ambulatory Visit: Payer: Self-pay | Admitting: *Deleted

## 2022-05-28 DIAGNOSIS — R600 Localized edema: Secondary | ICD-10-CM | POA: Insufficient documentation

## 2022-05-28 NOTE — Patient Outreach (Signed)
  Care Coordination   05/28/2022 Name: Phillip Wilson MRN: [id number] DOB: 07/31/1945   Care Coordination Outreach Attempts:  An unsuccessful telephone outreach was attempted today to offer the patient information about available care coordination services.  Follow Up Plan:  Additional outreach attempts will be made to offer the patient care coordination information and services.   Encounter Outcome:  No Answer   Care Coordination Interventions:  No, not indicated    Kenlee Vogt L. Noelle Penner, RN, BSN, CCM [facility] Care Management Community Coordinator Office number [phone]

## 2022-06-04 ENCOUNTER — Encounter: Payer: Self-pay | Admitting: *Deleted

## 2022-06-13 ENCOUNTER — Encounter (HOSPITAL_COMMUNITY): Payer: 59

## 2022-06-16 ENCOUNTER — Ambulatory Visit: Payer: Self-pay | Admitting: *Deleted

## 2022-06-16 ENCOUNTER — Encounter: Payer: Self-pay | Admitting: *Deleted

## 2022-06-16 NOTE — Patient Instructions (Signed)
Visit Information  Thank you for taking time to visit with me today. Please don't hesitate to contact me if I can be of assistance to you.   Following are the goals we discussed today:   Goals Addressed             This Visit's Progress    Follow HF Action Plan to avoid complications and hospitalizations.   On track    As of 06/16/22 he has not had hospital re admission in 30+ days  CURRENT WT 189# NO SOB NO EDEMA.  Interventions Today    Flowsheet Row Most Recent Value  Chronic Disease   Chronic disease during today's visit Diabetes, Congestive Heart Failure (CHF)  General Interventions   General Interventions Discussed/Reviewed General Interventions Discussed, Durable Medical Equipment (DME), Doctor Visits, Labs  Labs Hgb A1c annually  Doctor Visits Discussed/Reviewed Doctor Visits Discussed, PCP  Durable Medical Equipment (DME) Other  PCP/Specialist Visits Compliance with follow-up visit  Exercise Interventions   Exercise Discussed/Reviewed Exercise Discussed, Physical Activity  Physical Activity Discussed/Reviewed Physical Activity Discussed, Home Exercise Program (HEP)  Education Interventions   Education Provided Provided Education  Provided Verbal Education On Nutrition, Medication, Community Resources  [monitoring for salty foods, rinsing salty foods, Chartered loss adjuster for packing medications as needed]  Mental Health Interventions   Mental Health Discussed/Reviewed Mental Health Discussed, Coping Strategies  Nutrition Interventions   Nutrition Discussed/Reviewed Nutrition Discussed, Fluid intake, Decreasing sugar intake, Decreasing salt  Pharmacy Interventions   Pharmacy Dicussed/Reviewed Pharmacy Topics Discussed, Affording Medications, Medications and their functions  Safety Interventions   Safety Discussed/Reviewed Safety Discussed, Home Safety  Home Safety Assistive Devices                 Our next appointment is by telephone on 07/17/22 at 2  pm  Please call the care guide team at [phone] if you need to cancel or reschedule your appointment.   If you are experiencing a Mental Health or Behavioral Health Crisis or need someone to talk to, please call the Suicide and Crisis Lifeline: 988 call the [place] National Suicide Prevention Lifeline: (915)369-7350 or TTY: 818-351-2076 TTY 623-645-0540) to talk to a trained counselor call 1-800-273-TALK (toll free, 24 hour hotline) call the [facility]: 727-174-6094 call 911   The patient verbalized understanding of instructions, educational materials, and care plan provided today and DECLINED offer to receive copy of patient instructions, educational materials, and care plan.   The patient has been provided with contact information for the care management team and has been advised to call with any health related questions or concerns.   Brecken Walth L. Noelle Penner, RN, BSN, CCM [facility] Care Management Community Coordinator Office number [phone]

## 2022-06-16 NOTE — Patient Outreach (Signed)
  Care Coordination   Follow Up Visit Note   06/16/2022 Name: Phillip Wilson MRN: [id number] DOB: 1945-04-22  Phillip Wilson is a 77 y.o. year old male who sees Phillip Wilson, Phillip Hazel, MD for primary care. I spoke with  Phillip Wilson by phone today.  What matters to the patients health and wellness today?  Congestive heart failure  denies shortness of breath nor swelling He reports he is able complete his lawn and feed his dog without worsening symptoms As of 06/16/22 he has not had hospital re admission in 30+ days  He is very pleased with his progression and reports he will continue to take his medicine, weigh, eat foods with no or low salt and attend his MD visits Weight remains at 189 lb He had been over 209 lb  Reports his cbgs are better  Noted HgA1c values are within normal limits on 04/18/22 at 6.5 and 03/27/21 at 5.9 Go to pcp to 06/30/22 to see the pharmacy staff 07/11/22 for labs 07/17/22 for MD follow up Confirms he was seen on 06/10/22 by [facility] RN CM  Denies any other health and wellness concerns today   Goals Addressed             This Visit's Progress    Follow HF Action Plan to avoid complications and hospitalizations.   On track    As of 06/16/22 he has not had hospital re admission in 30+ days  CURRENT WT 189# NO SOB NO EDEMA.  Interventions Today    Flowsheet Row Most Recent Value  Chronic Disease   Chronic disease during today's visit Diabetes, Congestive Heart Failure (CHF)  General Interventions   General Interventions Discussed/Reviewed General Interventions Discussed, Durable Medical Equipment (DME), Doctor Visits, Labs  Labs Hgb A1c annually  Doctor Visits Discussed/Reviewed Doctor Visits Discussed, PCP  Durable Medical Equipment (DME) Other  PCP/Specialist Visits Compliance with follow-up visit  Exercise Interventions   Exercise Discussed/Reviewed Exercise Discussed, Physical Activity  Physical Activity Discussed/Reviewed Physical Activity Discussed, Home  Exercise Program (HEP)  Education Interventions   Education Provided Provided Education  Provided Verbal Education On Nutrition, Medication, Community Resources  [monitoring for salty foods, rinsing salty foods, Chartered loss adjuster for packing medications as needed]  Mental Health Interventions   Mental Health Discussed/Reviewed Mental Health Discussed, Coping Strategies  Nutrition Interventions   Nutrition Discussed/Reviewed Nutrition Discussed, Fluid intake, Decreasing sugar intake, Decreasing salt  Pharmacy Interventions   Pharmacy Dicussed/Reviewed Pharmacy Topics Discussed, Affording Medications, Medications and their functions  Safety Interventions   Safety Discussed/Reviewed Safety Discussed, Home Safety  Home Safety Assistive Devices                 SDOH assessments and interventions completed:  Yes  SDOH Interventions Today    Flowsheet Row Most Recent Value  SDOH Interventions   Transportation Interventions Intervention Not Indicated        Care Coordination Interventions:  Yes, provided   Follow up plan: Follow up call scheduled for 07/17/22    Encounter Outcome:  Pt. Visit Completed   Phillip Mateus L. Noelle Penner, RN, BSN, CCM [facility] Care Management Community Coordinator Office number [phone]

## 2022-06-17 ENCOUNTER — Inpatient Hospital Stay: Payer: 59

## 2022-06-18 ENCOUNTER — Inpatient Hospital Stay: Payer: 59 | Attending: Physician Assistant

## 2022-06-24 ENCOUNTER — Inpatient Hospital Stay: Payer: 59 | Admitting: Physician Assistant

## 2022-06-30 NOTE — Progress Notes (Signed)
PCP: Dr. Jeanice Lim Cardiology: [facility], [name] Cardiology: Dr. Shirlee Latch  77 y.o. with history of cardiomyopathy (never had full workup of etiology), HTN, CKD, paroxysmal atrial fibrillation and NSVT was referred by Randall An, PA, for evaluation of CHF. Much of history was derived from the patient's sister.  The patient was not sure what medications he was taking but his sister had the bottles.  He has had a cardiomyopathy since at least 2012, echo at that time showed EF 30-35%.  EF has been low since that time, most recent echo was in 9/21 with LV EF <15%, moderate-severe likely functional MR, and moderate RV dysfunction.  He has a history of NSVT and is on amiodarone.  He has never had a full workup for etiology of cardiomyopathy (no cath, cMRI, etc).  The prior notes report significant noncompliance with cardiology followup though he seems to be taking his medications appropriately with the help of his sister.  He was never implanted with an ICD due to noncompliance per the notes. He has not been able to take Entresto due to hives.  His last hospitalization was in 9/21 at [facility] with CHF and atrial fibrillation/RVR.  He had cardiogenic shock and was on norepinephrine transiently.  He was cardioverted during that hospitalization.   Last seen in AHF 5/22, work up to investigate CM initiated with [facility] and cMRI. However, he called and cancelled all of his testing and follow ups.   Admitted 3/24 with a/c systolic HF and rate controlled AF. Placed on amiodarone. Echo showed EF 25%, severe LV dysfunction, mild LVH, RV severely reduced and enlarged, moderate TR and moderate to severe MR. He was diuresed with IV lasix and GDMT titrated, but limited by AKI. DCCV not pursued due to questionable compliance with Eliquis. He was discharged home, weight 211 lbs.  Seen post hospital follow up 4/24, he was markedly overloaded and in AF with RVR. He was admitted from clinic, started on IV  amiodarone and diuresed with IV lasix. Underwent successful DCCV to NSR. Drips weaned and GDMT titrated. He as discharged home w/ paramedicine, weight 190 lbs.   Today he returns for post hospital HF follow up. Overall feeling fine.  He is not short of breath walking further distances up inclines. Denies palpitations, abnormal bleeding, CP, dizziness, edema, or PND/Orthopnea. Appetite ok. No fever or chills. Weight at home 191 pounds. Taking all medications, he does not know what medications he takes, his sister arranges his pillboxes.   ECG (personally reviewed): sinus bradycardia  Labs (6/21): creatinine 1.48 Labs (9/21): creatinine 3.1 Labs (3/22): K 3, creatinine 1.14 Labs (4/24): K 3.8, creatinine 2.40  PMH: 1. Cardiomyopathy: Presumed nonischemic  - Echo (2012) with EF 30-35% - Echo (6/17) with EF 15-20% - Echo (9/21) with EF < 15%, moderate-severe MR, moderate RV dilation with moderately decreased RV systolic function, PASP 56 mmHg.  - Echo (3/24): EF 25%, severe LV dysfunction, mild LVH, RV severely reduced and enlarged, moderate TR and moderate to severe MR.  - Hives with Entresto 2. H/o NSVT: On amiodarone, no ICD placed per notes due to poor compliance.  3. Mitral regurgitation: Moderate-severe on 9/21 and 3/24 echo, suspect functional. 4. HTN 5. Hyperlipidemia 6. Type 2 diabetes 7. CKD: Creatinine has fluctuated markedly over the last year, suspect CKD stage 3.  8. Gout 9. Atrial fibrillation: Paroxysmal.  DCCV in 12/19 and again in 9/21 during hospitalization at [facility].  - DCCV 4/24  Social History   Socioeconomic History  Marital status: Single    Spouse name: Not on file   Number of children: 2   Years of education: Not on file   Highest education level: Not on file  Occupational History   Occupation: Maintenance department    Comment: [employer]  Tobacco Use   Smoking status: Never   Smokeless tobacco: Never  Vaping Use   Vaping Use: Never used  Substance and  Sexual Activity   Alcohol use: Yes    Alcohol/week: 14.0 standard drinks of alcohol    Types: 4 Cans of beer, 10 Standard drinks or equivalent per week    Comment: stopped 2019   Drug use: No   Sexual activity: Yes  Other Topics Concern   Not on file  Social History Narrative   Not on file   Social Determinants of Health   Financial Resource Strain: Low Risk  (05/07/2022)   Overall Financial Resource Strain (CARDIA)    Difficulty of Paying Living Expenses: Not hard at all  Food Insecurity: Food Insecurity Present (05/08/2022)   Hunger Vital Sign    Worried About Running Out of Food in the Last Year: Sometimes true    Ran Out of Food in the Last Year: Sometimes true  Transportation Needs: No Transportation Needs (06/16/2022)   PRAPARE - Administrator, Civil Service (Medical): No    Lack of Transportation (Non-Medical): No  Physical Activity: Inactive (05/07/2022)   Exercise Vital Sign    Days of Exercise per Week: 0 days    Minutes of Exercise per Session: 0 min  Stress: No Stress Concern Present (05/07/2022)   [employer] of Occupational Health - Occupational Stress Questionnaire    Feeling of Stress : Not at all  Social Connections: Socially Isolated (05/07/2022)   Social Connection and Isolation Panel [NHANES]    Frequency of Communication with Friends and Family: More than three times a week    Frequency of Social Gatherings with Friends and Family: More than three times a week    Attends Religious Services: Never    Database administrator or Organizations: No    Attends Banker Meetings: Never    Marital Status: Never married  Intimate Partner Violence: Not At Risk (05/08/2022)   Humiliation, Afraid, Rape, and Kick questionnaire    Fear of Current or Ex-Partner: No    Emotionally Abused: No    Physically Abused: No    Sexually Abused: No   Family History  Problem Relation Age of Onset   Dementia Mother    COPD Father    Diabetes Father     Coronary artery disease Father    ROS: All systems reviewed and negative except as per HPI.   Current Outpatient Medications  Medication Sig Dispense Refill   acetaminophen (TYLENOL) 325 MG tablet Take 325-650 mg by mouth every 6 (six) hours as needed for headache, mild pain or moderate pain.     allopurinol (ZYLOPRIM) 100 MG tablet TAKE 1/2 (ONE-HALF) TABLET BY MOUTH ONCE DAILY FOR GOUT (Patient taking differently: Take 50 mg by mouth daily. TAKE 1/2 (ONE-HALF) TABLET BY MOUTH ONCE DAILY FOR GOUT) 30 tablet 0   amiodarone (PACERONE) 200 MG tablet Take 1 tablet (200 mg total) by mouth daily. 90 tablet 3   amiodarone (PACERONE) 200 MG tablet Take 1 tablet by mouth daily.     apixaban (ELIQUIS) 5 MG TABS tablet Take 1 tablet (5 mg total) by mouth 2 (two) times daily. 180 tablet 1  atorvastatin (LIPITOR) 20 MG tablet Take 20 mg by mouth daily.     calcitRIOL (ROCALTROL) 0.25 MCG capsule .25 mcg 3 times a week. Monday , Wednesday, and Friday (Patient taking differently: Take 0.25 mcg by mouth every Monday, Wednesday, and Friday. .25 mcg 3 times a week. Monday , Wednesday, and Friday) 45 capsule 1   dapagliflozin propanediol (FARXIGA) 10 MG TABS tablet Take 1 tablet (10 mg total) by mouth daily. 30 tablet 0   furosemide (LASIX) 40 MG tablet Take 1 tablet (40 mg total) by mouth daily. 30 tablet 0   hydrOXYzine (ATARAX) 25 MG tablet Take 25 mg by mouth at bedtime as needed.     levothyroxine (SYNTHROID) 75 MCG tablet Take 1 tablet (75 mcg total) by mouth daily at 6 (six) AM. 30 tablet 0   metoprolol succinate (TOPROL-XL) 25 MG 24 hr tablet TAKE 1 Tablet twice a day 180 tablet 1   potassium chloride SA (KLOR-CON M) 20 MEQ tablet Take 2 tablets (40 mEq total) by mouth daily. 60 tablet 0   tamsulosin (FLOMAX) 0.4 MG CAPS capsule Take 1 capsule (0.4 mg total) by mouth daily. 90 capsule 1   No current facility-administered medications for this visit.   Wt Readings from Last 3 Encounters:  06/16/22  85.7 kg (189 lb)  05/23/22 87.6 kg (193 lb 3.2 oz)  05/13/22 86.2 kg (190 lb 0.6 oz)   There were no vitals taken for this visit. Physical Exam General:  NAD. No resp difficulty, walked into clinic HEENT: Normal Neck: Supple. No JVD. Carotids 2+ bilat; no bruits. No lymphadenopathy or thryomegaly appreciated. Cor: PMI nondisplaced. Regular rate & rhythm. No rubs, gallops, 2/6 HSM apex Lungs: Clear Abdomen: Soft, nontender, nondistended. No hepatosplenomegaly. No bruits or masses. Good bowel sounds. Extremities: No cyanosis, clubbing, rash, edema Neuro: Alert & oriented x 3, cranial nerves grossly intact. Moves all 4 extremities w/o difficulty. Affect pleasant.  Assessment/Plan: 1. Chronic Systolic Heart Failure: Long-standing cardiomyopathy of uncertain etiology (never had full workup of etiology). Echo 9/21 with LV EF <15%, moderate-severe likely functional MR, and moderate RV dysfunction. This echo was apparently done in the setting of atrial fibrillation with RVR (at [facility]). As his cardiomyopathy pre-dates identification of atrial fibrillation, unlikely to have a tachy-mediated cardiomyopathy.  Echo 3/24 EF 25%, severe LV dysfunction, mild LVH, RV severely reduced and enlarged, moderate TR and moderate to severe MR. We have been unable to investigate etiology for his CM. He has not had R/LHC, unable to obtain cMRI as he has " "buckshot pellets" throughout chest. NYHA I-II, he is not volume overloaded today. GDMT limited by CKD IIIb. - Continue Lasix 40 mg daily + 40 KCL daily. BMET and BNP today. - Continue Farxiga 10 mg daily - Continue Toprol XL 25 mg bid. - Intolerant to [facility] in the past (hives) - No spiro w/ CKD.  - Plan to add back hydral/Imdur next visit, will not add today as he has not had his morning medications. - Plan to repeat echo in 3 months to look for EF improvement. 2. Atrial Fibrillation: Paroxysmal. Recent admission with atrial fibrillation with RVR. S/p DCCV to NSR  (4/24). He is in sinus today on ECG. - Continue amiodarone 200 mg daily, plan to reduce to 100 mg daily next week. Will follow LFTs and TSH. He will need a regular eye exam while on amiodarone. - Continue Eliquis 5 mg bid. No bleeding issues. CBC today. 3.  CKD IIIb: Baseline SCr 1.8. He is  followed by Dr. Wolfgang Phoenix. - Continue SGLT2i. - BMET today. 4. Mitral Regurgitation: moderate to severe on recent echo. Suspect functional. - HF and afib optimization per above.  5. Hypothyroidism: TSH 13.3, free T4 1.26. He is on amiodarone. - Continue synthroid. 7. Type 2 DM: Continue SGLT2i 8. TTP: Followed by Dr. Ellin Saba  9. SDOH: He has been assigned to [facility]. paramedicine.   Follow up in 3-4 weeks with APP and 12 weeks with Dr. Shirlee Latch + repeat echo.  [facility] FNP-BC 06/30/22

## 2022-07-02 ENCOUNTER — Ambulatory Visit (HOSPITAL_COMMUNITY)
Admission: RE | Admit: 2022-07-02 | Discharge: 2022-07-02 | Disposition: A | Discharge status: Home / Self Care | Payer: 59 | Source: Ambulatory Visit | Attending: Family Medicine | Admitting: Family Medicine

## 2022-07-02 ENCOUNTER — Encounter (HOSPITAL_COMMUNITY): Payer: Self-pay

## 2022-07-02 VITALS — BP 122/84 | HR 95 | Wt 192.2 lb

## 2022-07-02 DIAGNOSIS — I480 Paroxysmal atrial fibrillation: Secondary | ICD-10-CM | POA: Diagnosis not present

## 2022-07-02 DIAGNOSIS — I429 Cardiomyopathy, unspecified: Secondary | ICD-10-CM | POA: Diagnosis not present

## 2022-07-02 DIAGNOSIS — M3119 Other thrombotic microangiopathy: Secondary | ICD-10-CM | POA: Insufficient documentation

## 2022-07-02 DIAGNOSIS — E039 Hypothyroidism, unspecified: Secondary | ICD-10-CM | POA: Insufficient documentation

## 2022-07-02 DIAGNOSIS — Z7984 Long term (current) use of oral hypoglycemic drugs: Secondary | ICD-10-CM | POA: Diagnosis not present

## 2022-07-02 DIAGNOSIS — I5022 Chronic systolic (congestive) heart failure: Secondary | ICD-10-CM | POA: Diagnosis not present

## 2022-07-02 DIAGNOSIS — Z79890 Hormone replacement therapy (postmenopausal): Secondary | ICD-10-CM | POA: Insufficient documentation

## 2022-07-02 DIAGNOSIS — Z7901 Long term (current) use of anticoagulants: Secondary | ICD-10-CM | POA: Diagnosis not present

## 2022-07-02 DIAGNOSIS — Z79899 Other long term (current) drug therapy: Secondary | ICD-10-CM | POA: Insufficient documentation

## 2022-07-02 DIAGNOSIS — Z139 Encounter for screening, unspecified: Secondary | ICD-10-CM | POA: Diagnosis not present

## 2022-07-02 DIAGNOSIS — I130 Hypertensive heart and chronic kidney disease with heart failure and stage 1 through stage 4 chronic kidney disease, or unspecified chronic kidney disease: Secondary | ICD-10-CM | POA: Diagnosis not present

## 2022-07-02 DIAGNOSIS — M109 Gout, unspecified: Secondary | ICD-10-CM | POA: Diagnosis not present

## 2022-07-02 DIAGNOSIS — E1122 Type 2 diabetes mellitus with diabetic chronic kidney disease: Secondary | ICD-10-CM

## 2022-07-02 DIAGNOSIS — I340 Nonrheumatic mitral (valve) insufficiency: Secondary | ICD-10-CM | POA: Diagnosis not present

## 2022-07-02 DIAGNOSIS — N1832 Chronic kidney disease, stage 3b: Secondary | ICD-10-CM | POA: Insufficient documentation

## 2022-07-02 LAB — BASIC METABOLIC PANEL
Anion gap: 11 (ref 5–15)
BUN: 27 mg/dL — ABNORMAL HIGH (ref 8–23)
CO2: 28 mmol/L (ref 22–32)
Calcium: 9.1 mg/dL (ref 8.9–10.3)
Chloride: 101 mmol/L (ref 98–111)
Creatinine, Ser: 1.95 mg/dL — ABNORMAL HIGH (ref 0.61–1.24)
GFR, Estimated: 35 mL/min — ABNORMAL LOW (ref >60)
Glucose, Bld: 102 mg/dL — ABNORMAL HIGH (ref 70–99)
Potassium: 4.1 mmol/L (ref 3.5–5.1)
Sodium: 140 mmol/L (ref 135–145)

## 2022-07-02 LAB — BRAIN NATRIURETIC PEPTIDE: B Natriuretic Peptide: 176.5 pg/mL — ABNORMAL HIGH (ref 0.0–100.0)

## 2022-07-02 MED ORDER — FUROSEMIDE 40 MG PO TABS: 40.0000 mg | ORAL_TABLET | Freq: Every day | ORAL | 0 refills | Status: DC

## 2022-07-02 MED ORDER — DAPAGLIFLOZIN PROPANEDIOL 10 MG PO TABS: 10.0000 mg | ORAL_TABLET | Freq: Every day | ORAL | 0 refills | Status: DC

## 2022-07-02 MED ORDER — POTASSIUM CHLORIDE CRYS ER 20 MEQ PO TBCR: 40.0000 meq | EXTENDED_RELEASE_TABLET | Freq: Every day | ORAL | 0 refills | Status: DC

## 2022-07-02 MED ORDER — AMIODARONE HCL 200 MG PO TABS: 200.0000 mg | ORAL_TABLET | Freq: Two times a day (BID) | ORAL | 3 refills | Status: DC

## 2022-07-02 NOTE — Patient Instructions (Signed)
Medication Changes:  INCREASE amiodarone to 200 mg (1 tablet) TWICE A DAY.   *If you need a refill on your cardiac medications before your next appointment, please call your pharmacy*  Lab Work:  Labs done today, your results will be available in MyChart, we will contact you for abnormal readings.   Follow-Up in:   Your physician recommends that you schedule a follow-up appointment in: 2-3 WEEKS with our NP/PA   Do the following things EVERYDAY: Weigh yourself in the morning before breakfast. Write it down and keep it in a log. Take your medicines as prescribed Eat low salt foods--Limit salt (sodium) to 2000 mg per day.  Stay as active as you can everyday Limit all fluids for the day to less than 2 liters    Need to Contact us:  If you have any questions or concerns before your next appointment please send [place] a message through [place] or call our office at [phone].    TO LEAVE A MESSAGE FOR THE NURSE SELECT OPTION 2, PLEASE LEAVE A MESSAGE INCLUDING: YOUR NAME DATE OF BIRTH CALL BACK NUMBER REASON FOR CALL**this is important as we prioritize the call backs  YOU WILL RECEIVE A CALL BACK THE SAME DAY AS LONG AS YOU CALL BEFORE 4:00 PM   At the [facility], you and your health needs are our priority. As part of our continuing mission to provide you with exceptional heart care, we have created designated Provider Care Teams. These Care Teams include your primary Cardiologist (physician) and Advanced Practice Providers (APPs- Physician Assistants and Nurse Practitioners) who all work together to provide you with the care you need, when you need it.   You may see any of the following providers on your designated Care Team at your next follow up: Dr Arvilla Meres Dr Marca Ancona Dr. Marcos Eke, NP Robbie Lis, [place] [facility] [place], [place] Brynda Peon, NP Karle Plumber, PharmD   Please be sure to bring in all your  medications bottles to every appointment.    Thank you for choosing [facility]

## 2022-07-03 ENCOUNTER — Other Ambulatory Visit (HOSPITAL_COMMUNITY): Payer: Self-pay | Admitting: *Deleted

## 2022-07-03 MED ORDER — AMIODARONE HCL 200 MG PO TABS: 200.0000 mg | ORAL_TABLET | Freq: Two times a day (BID) | ORAL | 3 refills | Status: DC

## 2022-07-10 ENCOUNTER — Telehealth (HOSPITAL_COMMUNITY): Payer: Self-pay | Admitting: Licensed Clinical Social Worker

## 2022-07-10 NOTE — Telephone Encounter (Signed)
H&V Care Navigation CSW Progress Note  Clinical Social Worker sent updates to [employer] from visit last week.   SDOH Screenings   Food Insecurity: Food Insecurity Present (05/08/2022)  Housing: Low Risk  (05/08/2022)  Transportation Needs: No Transportation Needs (06/16/2022)  Utilities: Not At Risk (05/08/2022)  Alcohol Screen: Low Risk  (05/07/2022)  Depression (PHQ2-9): Low Risk  (06/16/2022)  Financial Resource Strain: Low Risk  (05/07/2022)  Physical Activity: Inactive (05/07/2022)  Social Connections: Socially Isolated (05/07/2022)  Stress: No Stress Concern Present (05/07/2022)  Tobacco Use: Low Risk  (07/02/2022)    Burna Sis, LCSW Clinical Social Worker [facility] Desk#: [phone] Cell#: [phone]

## 2022-07-17 ENCOUNTER — Ambulatory Visit: Payer: Self-pay | Admitting: *Deleted

## 2022-07-21 ENCOUNTER — Ambulatory Visit (HOSPITAL_COMMUNITY)
Admission: RE | Admit: 2022-07-21 | Discharge: 2022-07-21 | Disposition: A | Discharge status: Home / Self Care | Payer: 59 | Source: Ambulatory Visit | Attending: Family Medicine | Admitting: Family Medicine

## 2022-07-21 ENCOUNTER — Encounter (HOSPITAL_COMMUNITY): Payer: Self-pay

## 2022-07-21 VITALS — BP 136/78 | HR 84 | Wt 193.4 lb

## 2022-07-21 DIAGNOSIS — I340 Nonrheumatic mitral (valve) insufficiency: Secondary | ICD-10-CM | POA: Diagnosis not present

## 2022-07-21 DIAGNOSIS — Z79890 Hormone replacement therapy (postmenopausal): Secondary | ICD-10-CM | POA: Diagnosis not present

## 2022-07-21 DIAGNOSIS — N1832 Chronic kidney disease, stage 3b: Secondary | ICD-10-CM | POA: Diagnosis not present

## 2022-07-21 DIAGNOSIS — Z139 Encounter for screening, unspecified: Secondary | ICD-10-CM

## 2022-07-21 DIAGNOSIS — E039 Hypothyroidism, unspecified: Secondary | ICD-10-CM

## 2022-07-21 DIAGNOSIS — Z8249 Family history of ischemic heart disease and other diseases of the circulatory system: Secondary | ICD-10-CM | POA: Diagnosis not present

## 2022-07-21 DIAGNOSIS — Z5941 Food insecurity: Secondary | ICD-10-CM | POA: Insufficient documentation

## 2022-07-21 DIAGNOSIS — I130 Hypertensive heart and chronic kidney disease with heart failure and stage 1 through stage 4 chronic kidney disease, or unspecified chronic kidney disease: Secondary | ICD-10-CM | POA: Insufficient documentation

## 2022-07-21 DIAGNOSIS — E1122 Type 2 diabetes mellitus with diabetic chronic kidney disease: Secondary | ICD-10-CM | POA: Insufficient documentation

## 2022-07-21 DIAGNOSIS — I5022 Chronic systolic (congestive) heart failure: Secondary | ICD-10-CM

## 2022-07-21 DIAGNOSIS — Z79899 Other long term (current) drug therapy: Secondary | ICD-10-CM | POA: Insufficient documentation

## 2022-07-21 DIAGNOSIS — Z7984 Long term (current) use of oral hypoglycemic drugs: Secondary | ICD-10-CM | POA: Insufficient documentation

## 2022-07-21 DIAGNOSIS — M3119 Other thrombotic microangiopathy: Secondary | ICD-10-CM | POA: Diagnosis not present

## 2022-07-21 DIAGNOSIS — Z7901 Long term (current) use of anticoagulants: Secondary | ICD-10-CM | POA: Insufficient documentation

## 2022-07-21 DIAGNOSIS — I480 Paroxysmal atrial fibrillation: Secondary | ICD-10-CM

## 2022-07-21 DIAGNOSIS — Z833 Family history of diabetes mellitus: Secondary | ICD-10-CM | POA: Diagnosis not present

## 2022-07-21 LAB — COMPREHENSIVE METABOLIC PANEL
ALT: 24 U/L (ref 0–44)
AST: 52 U/L — ABNORMAL HIGH (ref 15–41)
Albumin: 3.8 g/dL (ref 3.5–5.0)
Alkaline Phosphatase: 140 U/L — ABNORMAL HIGH (ref 38–126)
Anion gap: 7 (ref 5–15)
BUN: 34 mg/dL — ABNORMAL HIGH (ref 8–23)
CO2: 26 mmol/L (ref 22–32)
Calcium: 9.3 mg/dL (ref 8.9–10.3)
Chloride: 106 mmol/L (ref 98–111)
Creatinine, Ser: 2.26 mg/dL — ABNORMAL HIGH (ref 0.61–1.24)
GFR, Estimated: 30 mL/min — ABNORMAL LOW (ref >60)
Glucose, Bld: 100 mg/dL — ABNORMAL HIGH (ref 70–99)
Potassium: 5.2 mmol/L — ABNORMAL HIGH (ref 3.5–5.1)
Sodium: 139 mmol/L (ref 135–145)
Total Bilirubin: 1.7 mg/dL — ABNORMAL HIGH (ref 0.3–1.2)
Total Protein: 7 g/dL (ref 6.5–8.1)

## 2022-07-21 LAB — TSH: TSH: 2.31 u[IU]/mL (ref 0.350–4.500)

## 2022-07-21 MED ORDER — ISOSORB DINITRATE-HYDRALAZINE 20-37.5 MG PO TABS: 0.5000 | ORAL_TABLET | Freq: Three times a day (TID) | ORAL | 3 refills | Status: DC

## 2022-07-21 MED ORDER — DAPAGLIFLOZIN PROPANEDIOL 10 MG PO TABS: 10.0000 mg | ORAL_TABLET | Freq: Every day | ORAL | 11 refills | Status: DC

## 2022-07-21 MED ORDER — AMIODARONE HCL 200 MG PO TABS: 200.0000 mg | ORAL_TABLET | Freq: Every day | ORAL | 3 refills | Status: DC

## 2022-07-21 NOTE — Progress Notes (Signed)
Height:     Weight: BMI:  Today's Date:  STOP BANG RISK ASSESSMENT S (snore) Have you been told that you snore?     YES   T (tired) Are you often tired, fatigued, or sleepy during the day?   YES  O (obstruction) Do you stop breathing, choke, or gasp during sleep? NO   P (pressure) Do you have or are you being treated for high blood pressure? YES   B (BMI) Is your body index greater than 35 kg/m? NO   A (age) Are you 75 years old or older? YES   N (neck) Do you have a neck circumference greater than 16 inches?   NO   G (gender) Are you a male? YES   TOTAL STOP/BANG "YES" ANSWERS 5                                                                       For Office Use Only              Procedure Order Form    YES to 3+ Stop Bang questions OR two clinical symptoms - patient qualifies for WatchPAT (CPT 95800)      Clinical Notes: Will consult Sleep Specialist and refer for management of therapy due to patient increased risk of Sleep Apnea. Ordering a sleep study due to the following two clinical symptoms: Excessive daytime sleepiness G47.10 /  Morning Headaches G44.221 / Difficulty concentrating R41.840 / Memory problems or poor judgment G31.84 / Personality changes or irritability R45.4 / Loud snoring R06.83 / Depression F32.9 / Unrefreshed by sleep G47.8 / History of high blood pressure R03.0 / Insomnia G47.00

## 2022-07-21 NOTE — Patient Instructions (Addendum)
DECREASE Amiodarone to 200 mg ( 1 Tablet ) daily.  Labs done today, your results will be available in MyChart, we will contact you for abnormal readings.  Your provider has recommended that you have a home sleep study (Itamar Test).  We have provided you with the equipment in our office today. Please go ahead and download the app. DO NOT OPEN OR TAMPER WITH THE BOX UNTIL WE ADVISE YOU TO DO SO. Once insurance has approved the test our office will call you with PIN number and approval to proceed with testing. Once you have completed the test you just dispose of the equipment, the information is automatically uploaded to [place] via blue-tooth technology. If your test is positive for sleep apnea and you need a home CPAP machine you will be contacted by Dr Norris Cross office [facility]) to set this up.   Your physician has requested that you have an echocardiogram. Echocardiography is a painless test that uses sound waves to create images of your heart. It provides your doctor with information about the size and shape of your heart and how well your heart's chambers and valves are working. This procedure takes approximately one hour. There are no restrictions for this procedure. Please do NOT wear cologne, perfume, aftershave, or lotions (deodorant is allowed). Please arrive 15 minutes prior to your appointment time.   Your physician recommends that you schedule a follow-up appointment in: 4 Weeks with an echocardiogram   If you have any questions or concerns before your next appointment please send [place] a message through [place] or call our office at [phone].    TO LEAVE A MESSAGE FOR THE NURSE SELECT OPTION 2, PLEASE LEAVE A MESSAGE INCLUDING: YOUR NAME DATE OF BIRTH CALL BACK NUMBER REASON FOR CALL**this is important as we prioritize the call backs  YOU WILL RECEIVE A CALL BACK THE SAME DAY AS LONG AS YOU CALL BEFORE 4:00 PM  At the [facility], you and your health needs are  our priority. As part of our continuing mission to provide you with exceptional heart care, we have created designated Provider Care Teams. These Care Teams include your primary Cardiologist (physician) and Advanced Practice Providers (APPs- Physician Assistants and Nurse Practitioners) who all work together to provide you with the care you need, when you need it.   You may see any of the following providers on your designated Care Team at your next follow up: Dr Arvilla Meres Dr Marca Ancona Dr. Marcos Eke, NP Robbie Lis, [place] [facility] [place], [place] Brynda Peon, NP Karle Plumber, PharmD   Please be sure to bring in all your medications bottles to every appointment.    Thank you for choosing [facility]

## 2022-07-21 NOTE — Progress Notes (Signed)
PCP: Dr. Jeanice Lim Cardiology: [facility], [name] Cardiology: Dr. Shirlee Latch  77 y.o. with history of cardiomyopathy (never had full workup of etiology), HTN, CKD, paroxysmal atrial fibrillation and NSVT was referred by Randall An, PA, for evaluation of CHF. Much of history was derived from the patient's sister.  The patient was not sure what medications he was taking but his sister had the bottles.  He has had a cardiomyopathy since at least 2012, echo at that time showed EF 30-35%.  EF has been low since that time, most recent echo was in 9/21 with LV EF <15%, moderate-severe likely functional MR, and moderate RV dysfunction.  He has a history of NSVT and is on amiodarone.  He has never had a full workup for etiology of cardiomyopathy (no cath, cMRI, etc).  The prior notes report significant noncompliance with cardiology followup though he seems to be taking his medications appropriately with the help of his sister.  He was never implanted with an ICD due to noncompliance per the notes. He has not been able to take Entresto due to hives.  His last hospitalization was in 9/21 at [facility] with CHF and atrial fibrillation/RVR.  He had cardiogenic shock and was on norepinephrine transiently.  He was cardioverted during that hospitalization.   Last seen in AHF 5/22, work up to investigate CM initiated with [facility] and cMRI. However, he called and cancelled all of his testing and follow ups.   Admitted 3/24 with a/c systolic HF and rate controlled AF. Placed on amiodarone. Echo showed EF 25%, severe LV dysfunction, mild LVH, RV severely reduced and enlarged, moderate TR and moderate to severe MR. He was diuresed with IV lasix and GDMT titrated, but limited by AKI. DCCV not pursued due to questionable compliance with Eliquis. He was discharged home, weight 211 lbs.  Seen post hospital follow up 4/24, he was markedly overloaded and in AF with RVR. He was admitted from clinic, started on IV  amiodarone and diuresed with IV lasix. Underwent successful DCCV to NSR. Drips weaned and GDMT titrated. He as discharged home w/ paramedicine, weight 190 lbs.   Follow up 6/24, stable NYHA II but back in AF, rate controlled. Started on amiodarone 200 bid, with plans to bring back for ECG, if remained in AF would arrange for DCCV. Arranged for paramedicine.  Today he returns for HF follow up with his sister.. Overall feeling fine. He is now followed by [employer]. He is not short of breath push mowing his yard or walking up steps. Doing well with taking his meds now that he has pill packs. Denies palpitations, abnormal bleeding, CP, dizziness, edema, or PND/Orthopnea. Appetite ok. No fever or chills. Weight at home 192 pounds.   ECG (personally reviewed): atrial fibrillation 75 bpm  Labs (6/21): creatinine 1.48 Labs (9/21): creatinine 3.1 Labs (3/22): K 3, creatinine 1.14 Labs (4/24): K 3.8, creatinine 2.40  PMH: 1. Cardiomyopathy: Presumed nonischemic  - Echo (2012) with EF 30-35% - Echo (6/17) with EF 15-20% - Echo (9/21) with EF < 15%, moderate-severe MR, moderate RV dilation with moderately decreased RV systolic function, PASP 56 mmHg.  - Echo (3/24): EF 25%, severe LV dysfunction, mild LVH, RV severely reduced and enlarged, moderate TR and moderate to severe MR.  - Hives with Entresto 2. H/o NSVT: On amiodarone, no ICD placed per notes due to poor compliance.  3. Mitral regurgitation: Moderate-severe on 9/21 and 3/24 echo, suspect functional. 4. HTN 5. Hyperlipidemia 6. Type 2 diabetes  7. CKD: Creatinine has fluctuated markedly over the last year, suspect CKD stage 3.  8. Gout 9. Atrial fibrillation: Paroxysmal.  DCCV in 12/19 and again in 9/21 during hospitalization at [facility].  - DCCV 4/24  Social History   Socioeconomic History   Marital status: Single    Spouse name: Not on file   Number of children: 2   Years of education: Not on file   Highest education  level: Not on file  Occupational History   Occupation: Maintenance department    Comment: [employer]  Tobacco Use   Smoking status: Never   Smokeless tobacco: Never  Vaping Use   Vaping Use: Never used  Substance and Sexual Activity   Alcohol use: Yes    Alcohol/week: 14.0 standard drinks of alcohol    Types: 4 Cans of beer, 10 Standard drinks or equivalent per week    Comment: stopped 2019   Drug use: No   Sexual activity: Yes  Other Topics Concern   Not on file  Social History Narrative   Not on file   Social Determinants of Health   Financial Resource Strain: Low Risk  (05/07/2022)   Overall Financial Resource Strain (CARDIA)    Difficulty of Paying Living Expenses: Not hard at all  Food Insecurity: Food Insecurity Present (05/08/2022)   Hunger Vital Sign    Worried About Running Out of Food in the Last Year: Sometimes true    Ran Out of Food in the Last Year: Sometimes true  Transportation Needs: No Transportation Needs (06/16/2022)   PRAPARE - Administrator, Civil Service (Medical): No    Lack of Transportation (Non-Medical): No  Physical Activity: Inactive (05/07/2022)   Exercise Vital Sign    Days of Exercise per Week: 0 days    Minutes of Exercise per Session: 0 min  Stress: No Stress Concern Present (05/07/2022)   [employer] of Occupational Health - Occupational Stress Questionnaire    Feeling of Stress : Not at all  Social Connections: Socially Isolated (05/07/2022)   Social Connection and Isolation Panel [NHANES]    Frequency of Communication with Friends and Family: More than three times a week    Frequency of Social Gatherings with Friends and Family: More than three times a week    Attends Religious Services: Never    Database administrator or Organizations: No    Attends Banker Meetings: Never    Marital Status: Never married  Intimate Partner Violence: Not At Risk (05/08/2022)   Humiliation, Afraid, Rape, and Kick questionnaire     Fear of Current or Ex-Partner: No    Emotionally Abused: No    Physically Abused: No    Sexually Abused: No   Family History  Problem Relation Age of Onset   Dementia Mother    COPD Father    Diabetes Father    Coronary artery disease Father    ROS: All systems reviewed and negative except as per HPI.   Current Outpatient Medications  Medication Sig Dispense Refill   acetaminophen (TYLENOL) 325 MG tablet Take 325-650 mg by mouth every 6 (six) hours as needed for headache, mild pain or moderate pain.     allopurinol (ZYLOPRIM) 100 MG tablet TAKE 1/2 (ONE-HALF) TABLET BY MOUTH ONCE DAILY FOR GOUT (Patient taking differently: Take 50 mg by mouth daily. TAKE 1/2 (ONE-HALF) TABLET BY MOUTH ONCE DAILY FOR GOUT) 30 tablet 0   amiodarone (PACERONE) 200 MG tablet Take 1 tablet (200 mg total)  by mouth 2 (two) times daily. 180 tablet 3   apixaban (ELIQUIS) 5 MG TABS tablet Take 1 tablet (5 mg total) by mouth 2 (two) times daily. 180 tablet 1   atorvastatin (LIPITOR) 20 MG tablet Take 20 mg by mouth daily.     calcitRIOL (ROCALTROL) 0.25 MCG capsule .25 mcg 3 times a week. Monday , Wednesday, and Friday (Patient taking differently: Take 0.25 mcg by mouth every Monday, Wednesday, and Friday. .25 mcg 3 times a week. Monday , Wednesday, and Friday) 45 capsule 1   dapagliflozin propanediol (FARXIGA) 10 MG TABS tablet Take 1 tablet (10 mg total) by mouth daily. 30 tablet 0   furosemide (LASIX) 40 MG tablet Take 1 tablet (40 mg total) by mouth daily. 30 tablet 0   hydrOXYzine (ATARAX) 25 MG tablet Take 25 mg by mouth at bedtime as needed.     levothyroxine (SYNTHROID) 75 MCG tablet Take 1 tablet (75 mcg total) by mouth daily at 6 (six) AM. 30 tablet 0   metoprolol succinate (TOPROL-XL) 25 MG 24 hr tablet TAKE 1 Tablet twice a day 180 tablet 1   potassium chloride SA (KLOR-CON M) 20 MEQ tablet Take 2 tablets (40 mEq total) by mouth daily. 60 tablet 0   tamsulosin (FLOMAX) 0.4 MG CAPS capsule Take 1 capsule  (0.4 mg total) by mouth daily. 90 capsule 1   No current facility-administered medications for this encounter.   Wt Readings from Last 3 Encounters:  07/21/22 87.7 kg (193 lb 6.4 oz)  07/02/22 87.2 kg (192 lb 3.2 oz)  06/16/22 85.7 kg (189 lb)   BP 136/78   Pulse 84   Wt 87.7 kg (193 lb 6.4 oz)   SpO2 96%   BMI 29.41 kg/m  Physical Exam General:  NAD. No resp difficulty, walked into clinic HEENT: Normal Neck: Supple. No JVD. Carotids 2+ bilat; no bruits. No lymphadenopathy or thryomegaly appreciated. Cor: PMI nondisplaced. Irregular rate & rhythm. No rubs, gallops, 2/6 HSM apex Lungs: Clear Abdomen: Soft, nontender, nondistended. No hepatosplenomegaly. No bruits or masses. Good bowel sounds. Extremities: No cyanosis, clubbing, rash, edema Neuro: Alert & oriented x 3, cranial nerves grossly intact. Moves all 4 extremities w/o difficulty. Affect pleasant.  Assessment/Plan: 1. Chronic Systolic Heart Failure: Long-standing cardiomyopathy of uncertain etiology (never had full workup of etiology). Echo 9/21 with LV EF <15%, moderate-severe likely functional MR, and moderate RV dysfunction. This echo was apparently done in the setting of atrial fibrillation with RVR (at [facility]). As his cardiomyopathy pre-dates identification of atrial fibrillation, unlikely to have a tachy-mediated cardiomyopathy.  Echo 3/24 EF 25%, severe LV dysfunction, mild LVH, RV severely reduced and enlarged, moderate TR and moderate to severe MR. We have been unable to investigate etiology for his CM. He has not had R/LHC, unable to obtain cMRI as he has " "buckshot pellets" throughout chest. NYHA I-II, he is not volume overloaded today. GDMT limited by CKD IIIb. - Start BiDil 0.5 tab tid. - Continue Lasix 40 mg daily + 40 KCL daily. BMET today. - Continue Farxiga 10 mg daily - Continue Toprol XL 25 mg bid. - Intolerant to [facility] in the past (hives) - No spiro w/ CKD.  - Repeat echo next visit.  2. Atrial  Fibrillation: Paroxysmal. Recent admission with atrial fibrillation with RVR. S/p DCCV to NSR (4/24). He was back in AF at follow up 06/29/22, and amiodarone started. Unfortunately he remains in AF, rate controlled.  - Continue Eliquis 5 mg bid. No bleeding issues.  He has not missed any doses. - Long discussion about repeat DCCV. He initially was agreeable, but declined at the end of the visit. I encouraged him to re-consider.  - Decrease amiodarone to 200 mg daily. CMET and TSH today. He will need regular eye exam - Consider referral to EP for ablation consideration, but will hold off until we repeat his echo, in case he needs ICD as well. - Arrange home sleep study. Sister can help. 3.  CKD IIIb: Baseline SCr 1.8. He is followed by Dr. Wolfgang Phoenix. - Continue SGLT2i. BMET today. 4. Mitral Regurgitation: moderate to severe on recent echo. Suspect functional. - HF and afib optimization per above.  5. Hypothyroidism: TSH 13.3, free T4 1.26. He is on amiodarone. - Continue levothyroxine. As above, repeat LFTs and TSH today. 6. Type 2 DM: Continue SGLT2i 7. TTP: Followed by Dr. Ellin Saba  8. SDOH: Continue [place] paramedicine.   Follow up next month with Dr. Shirlee Latch + echo.  Anderson Malta [facility] FNP-BC 07/21/22

## 2022-07-23 ENCOUNTER — Telehealth (HOSPITAL_COMMUNITY): Payer: Self-pay

## 2022-07-23 DIAGNOSIS — I5022 Chronic systolic (congestive) heart failure: Secondary | ICD-10-CM

## 2022-07-23 MED ORDER — FUROSEMIDE 20 MG PO TABS: 20.0000 mg | ORAL_TABLET | Freq: Every day | ORAL | 5 refills | Status: DC

## 2022-07-23 NOTE — Telephone Encounter (Signed)
-----   Message from Jacklynn Ganong, [place] sent at 07/21/2022  4:41 PM EDT ----- K is elevated and kidney function declined compared to previous. Thyroid stable  Stop KCL, decrease Lasix to 20 mg daily.  Repeat BMET on Friday.

## 2022-07-23 NOTE — Telephone Encounter (Signed)
Patient advised and verbalized understanding. Will have repeat blood work done at [facility] on Friday. Med list updated to reflect changes.   Meds ordered this encounter  Medications   furosemide (LASIX) 20 MG tablet    Sig: Take 1 tablet (20 mg total) by mouth daily.    Dispense:  30 tablet    Refill:  5    Please cancel all previous orders for current medication. Change in dosage or pill size.   Orders Placed This Encounter  Procedures   Basic metabolic panel    Standing Status:   Future    Standing Expiration Date:   07/23/2023    Order Specific Question:   Release to patient    Answer:   Immediate    Order Specific Question:   Release to patient    Answer:   Immediate [1]

## 2022-07-24 ENCOUNTER — Telehealth (HOSPITAL_COMMUNITY): Payer: Self-pay

## 2022-07-24 NOTE — Telephone Encounter (Addendum)
Left message to call office back, ok for Arham to do his sleep study test.   ----- Message from Modesta Messing, CMA sent at 07/24/2022  1:42 PM EDT ----- No pre cert reqd  ----- Message ----- From: Linda Hedges, RN Sent: 07/22/2022   3:18 PM EDT To: Modesta Messing, CMA  Itamar given on 06/24

## 2022-07-28 DIAGNOSIS — R7303 Prediabetes: Secondary | ICD-10-CM | POA: Diagnosis not present

## 2022-07-28 DIAGNOSIS — D696 Thrombocytopenia, unspecified: Secondary | ICD-10-CM | POA: Diagnosis not present

## 2022-07-28 DIAGNOSIS — I10 Essential (primary) hypertension: Secondary | ICD-10-CM | POA: Diagnosis not present

## 2022-07-28 DIAGNOSIS — E782 Mixed hyperlipidemia: Secondary | ICD-10-CM | POA: Diagnosis not present

## 2022-07-29 ENCOUNTER — Other Ambulatory Visit (HOSPITAL_COMMUNITY): Payer: Self-pay | Admitting: Family Medicine

## 2022-07-29 NOTE — Telephone Encounter (Signed)
Pt aware.

## 2022-08-05 ENCOUNTER — Telehealth (HOSPITAL_COMMUNITY): Payer: Self-pay | Admitting: Surgery

## 2022-08-05 NOTE — Telephone Encounter (Signed)
I called patient and spoke to sister who tells me that she will relay message that Phillip Wilson can complete his ordered home sleep study.  Insurance precert is not required.

## 2022-08-06 DIAGNOSIS — I509 Heart failure, unspecified: Secondary | ICD-10-CM | POA: Diagnosis not present

## 2022-08-06 DIAGNOSIS — I429 Cardiomyopathy, unspecified: Secondary | ICD-10-CM | POA: Diagnosis not present

## 2022-08-06 DIAGNOSIS — R7303 Prediabetes: Secondary | ICD-10-CM | POA: Diagnosis not present

## 2022-08-06 DIAGNOSIS — D696 Thrombocytopenia, unspecified: Secondary | ICD-10-CM | POA: Diagnosis not present

## 2022-08-06 DIAGNOSIS — I482 Chronic atrial fibrillation, unspecified: Secondary | ICD-10-CM | POA: Diagnosis not present

## 2022-08-06 DIAGNOSIS — E039 Hypothyroidism, unspecified: Secondary | ICD-10-CM | POA: Diagnosis not present

## 2022-08-06 DIAGNOSIS — E782 Mixed hyperlipidemia: Secondary | ICD-10-CM | POA: Diagnosis not present

## 2022-08-06 DIAGNOSIS — D6869 Other thrombophilia: Secondary | ICD-10-CM | POA: Diagnosis not present

## 2022-08-06 DIAGNOSIS — E876 Hypokalemia: Secondary | ICD-10-CM | POA: Diagnosis not present

## 2022-08-06 DIAGNOSIS — I130 Hypertensive heart and chronic kidney disease with heart failure and stage 1 through stage 4 chronic kidney disease, or unspecified chronic kidney disease: Secondary | ICD-10-CM | POA: Diagnosis not present

## 2022-08-06 DIAGNOSIS — R809 Proteinuria, unspecified: Secondary | ICD-10-CM | POA: Diagnosis not present

## 2022-08-06 DIAGNOSIS — N1832 Chronic kidney disease, stage 3b: Secondary | ICD-10-CM | POA: Diagnosis not present

## 2022-08-07 NOTE — Telephone Encounter (Signed)
Pts sister returned call to triage and reports Pt declined study Will return device ASAP

## 2022-08-18 ENCOUNTER — Telehealth (HOSPITAL_COMMUNITY): Payer: Self-pay | Admitting: Cardiology

## 2022-08-18 ENCOUNTER — Other Ambulatory Visit (HOSPITAL_COMMUNITY): Payer: Self-pay | Admitting: Cardiology

## 2022-08-18 MED ORDER — AMIODARONE HCL 200 MG PO TABS: 200.0000 mg | ORAL_TABLET | Freq: Every day | ORAL | 3 refills | Status: DC

## 2022-08-18 NOTE — Telephone Encounter (Signed)
Macy with para medicine called to request updated script for amio Recently decreased and new dose not filled in pill pack Script sent

## 2022-08-26 ENCOUNTER — Ambulatory Visit (HOSPITAL_COMMUNITY)
Admission: RE | Admit: 2022-08-26 | Discharge: 2022-08-26 | Disposition: A | Discharge status: Home / Self Care | Payer: 59 | Source: Ambulatory Visit | Attending: Internal Medicine | Admitting: Internal Medicine

## 2022-08-26 ENCOUNTER — Other Ambulatory Visit (HOSPITAL_COMMUNITY): Payer: Self-pay

## 2022-08-26 ENCOUNTER — Ambulatory Visit (HOSPITAL_BASED_OUTPATIENT_CLINIC_OR_DEPARTMENT_OTHER)
Admission: RE | Admit: 2022-08-26 | Discharge: 2022-08-26 | Disposition: A | Discharge status: Home / Self Care | Payer: 59 | Source: Ambulatory Visit | Attending: Cardiology | Admitting: Cardiology

## 2022-08-26 ENCOUNTER — Encounter (HOSPITAL_COMMUNITY): Payer: Self-pay | Admitting: Cardiology

## 2022-08-26 VITALS — BP 162/90 | HR 80 | Wt 196.2 lb

## 2022-08-26 DIAGNOSIS — I5022 Chronic systolic (congestive) heart failure: Secondary | ICD-10-CM

## 2022-08-26 DIAGNOSIS — E1122 Type 2 diabetes mellitus with diabetic chronic kidney disease: Secondary | ICD-10-CM | POA: Diagnosis not present

## 2022-08-26 DIAGNOSIS — Z79899 Other long term (current) drug therapy: Secondary | ICD-10-CM | POA: Insufficient documentation

## 2022-08-26 DIAGNOSIS — Z5941 Food insecurity: Secondary | ICD-10-CM | POA: Diagnosis not present

## 2022-08-26 DIAGNOSIS — I080 Rheumatic disorders of both mitral and aortic valves: Secondary | ICD-10-CM | POA: Diagnosis not present

## 2022-08-26 DIAGNOSIS — N1832 Chronic kidney disease, stage 3b: Secondary | ICD-10-CM | POA: Insufficient documentation

## 2022-08-26 DIAGNOSIS — I429 Cardiomyopathy, unspecified: Secondary | ICD-10-CM | POA: Insufficient documentation

## 2022-08-26 DIAGNOSIS — E039 Hypothyroidism, unspecified: Secondary | ICD-10-CM | POA: Insufficient documentation

## 2022-08-26 DIAGNOSIS — Z7901 Long term (current) use of anticoagulants: Secondary | ICD-10-CM | POA: Insufficient documentation

## 2022-08-26 DIAGNOSIS — I4819 Other persistent atrial fibrillation: Secondary | ICD-10-CM | POA: Diagnosis not present

## 2022-08-26 DIAGNOSIS — Z7984 Long term (current) use of oral hypoglycemic drugs: Secondary | ICD-10-CM | POA: Insufficient documentation

## 2022-08-26 DIAGNOSIS — I480 Paroxysmal atrial fibrillation: Secondary | ICD-10-CM | POA: Diagnosis not present

## 2022-08-26 DIAGNOSIS — Z79890 Hormone replacement therapy (postmenopausal): Secondary | ICD-10-CM | POA: Diagnosis not present

## 2022-08-26 DIAGNOSIS — I130 Hypertensive heart and chronic kidney disease with heart failure and stage 1 through stage 4 chronic kidney disease, or unspecified chronic kidney disease: Secondary | ICD-10-CM | POA: Diagnosis not present

## 2022-08-26 DIAGNOSIS — M3119 Other thrombotic microangiopathy: Secondary | ICD-10-CM | POA: Diagnosis not present

## 2022-08-26 DIAGNOSIS — Z8249 Family history of ischemic heart disease and other diseases of the circulatory system: Secondary | ICD-10-CM | POA: Insufficient documentation

## 2022-08-26 DIAGNOSIS — Z833 Family history of diabetes mellitus: Secondary | ICD-10-CM | POA: Diagnosis not present

## 2022-08-26 LAB — ECHOCARDIOGRAM COMPLETE
Area-P 1/2: 3.19 cm2
Calc EF: 33.9 %
Est EF: 30
MV M vel: 5.21 m/s
MV Peak grad: 108.6 mmHg
P 1/2 time: 551 msec
Radius: 0.4 cm
S' Lateral: 5.3 cm
Single Plane A2C EF: 29.7 %
Single Plane A4C EF: 29.9 %

## 2022-08-26 LAB — COMPREHENSIVE METABOLIC PANEL
ALT: 25 U/L (ref 0–44)
AST: 50 U/L — ABNORMAL HIGH (ref 15–41)
Albumin: 3.6 g/dL (ref 3.5–5.0)
Alkaline Phosphatase: 155 U/L — ABNORMAL HIGH (ref 38–126)
Anion gap: 7 (ref 5–15)
BUN: 17 mg/dL (ref 8–23)
CO2: 28 mmol/L (ref 22–32)
Calcium: 9.1 mg/dL (ref 8.9–10.3)
Chloride: 103 mmol/L (ref 98–111)
Creatinine, Ser: 1.59 mg/dL — ABNORMAL HIGH (ref 0.61–1.24)
GFR, Estimated: 45 mL/min — ABNORMAL LOW (ref >60)
Glucose, Bld: 98 mg/dL (ref 70–99)
Potassium: 4.1 mmol/L (ref 3.5–5.1)
Sodium: 138 mmol/L (ref 135–145)
Total Bilirubin: 1.6 mg/dL — ABNORMAL HIGH (ref 0.3–1.2)
Total Protein: 7.1 g/dL (ref 6.5–8.1)

## 2022-08-26 LAB — TSH: TSH: 2.111 u[IU]/mL (ref 0.350–4.500)

## 2022-08-26 MED ORDER — ISOSORB DINITRATE-HYDRALAZINE 20-37.5 MG PO TABS: 1.0000 | ORAL_TABLET | Freq: Three times a day (TID) | ORAL | 3 refills | Status: DC

## 2022-08-26 MED ORDER — FUROSEMIDE 20 MG PO TABS: 40.0000 mg | ORAL_TABLET | Freq: Every day | ORAL | 3 refills | Status: DC

## 2022-08-26 NOTE — Patient Instructions (Signed)
INCREASE Bidil to 1 tablet Three times a day  INCREASE Lasix to 40 mg daily.  Labs done today, your results will be available in MyChart, we will contact you for abnormal readings.  Repeat blood work in 10 days at [facility].   You are scheduled for a Cardioversion on Friday, August 16 with Dr. Shirlee Latch.  Please arrive at the [facility] (Main Entrance A) at [facility]: [address] [place], [place] [address] at 6:30 AM (This time is 1 hour(s) before your procedure to ensure your preparation). Free valet parking service is available. You will check in at ADMITTING. The support person will be asked to wait in the waiting room.  It is OK to have someone drop you off and come back when you are ready to be discharged.    {TIP  Arrive 1 hour prior to procedure.  Nothing to eat or drink after midnight except a sip of water with medications (see medication instructions below)  MEDICATION INSTRUCTIONS: !!IF ANY NEW MEDICATIONS ARE STARTED AFTER TODAY, PLEASE NOTIFY YOUR PROVIDER AS SOON AS POSSIBLE!!  FYI: Medications such as Semaglutide (Ozempic, [place]), Tirzepatide (Mounjaro, Zepbound), Dulaglutide (Trulicity), etc ("GLP1 agonists") AND Canagliflozin (Invokana), Dapagliflozin (Farxiga), Empagliflozin (Jardiance), Ertugliflozin (Steglatro), Bexagliflozin [employer]) or any combination with one of these drugs such as Invokamet (Canagliflozin/Metformin), Synjardy (Empagliflozin/Metformin), etc ("SGLT2 inhibitors") must be held around the time of a procedure. This is not a comprehensive list of all of these drugs. Please review all of your medications and talk to your provider if you take any one of these. If you are not sure, ask your provider.      HOLD LASIX THE MORNING OF PROCEDURE.         HOLD: Dapagliflozin Marcelline Deist) for 3 days prior to the procedure. Last dose on Monday, August 12.   Continue taking your anticoagulant (blood thinner): Apixaban (Eliquis).  You will need to continue  this after your procedure until you are told by your provider that it is safe to stop.    FYI:  For your safety, and to allow [place] to monitor your vital signs accurately during the surgery/procedure we request: If you have artificial nails, gel coating, SNS etc, please have those removed prior to your surgery/procedure. Not having the nail coverings /polish removed may result in cancellation or delay of your surgery/procedure.  You must have a responsible person to drive you home and stay in the waiting area during your procedure. Failure to do so could result in cancellation.  Bring your insurance cards.  *Special Note: Every effort is made to have your procedure done on time. Occasionally there are emergencies that occur at the hospital that may cause delays. Please be patient if a delay does occur.   Your physician recommends that you schedule a follow-up appointment in: 1 MONTH  If you have any questions or concerns before your next appointment please send [place] a message through [place] or call our office at [phone].    TO LEAVE A MESSAGE FOR THE NURSE SELECT OPTION 2, PLEASE LEAVE A MESSAGE INCLUDING: YOUR NAME DATE OF BIRTH CALL BACK NUMBER REASON FOR CALL**this is important as we prioritize the call backs  YOU WILL RECEIVE A CALL BACK THE SAME DAY AS LONG AS YOU CALL BEFORE 4:00 PM  At the [facility], you and your health needs are our priority. As part of our continuing mission to provide you with exceptional heart care, we have created designated Provider Care Teams.  These Care Teams include your primary Cardiologist (physician) and Advanced Practice Providers (APPs- Physician Assistants and Nurse Practitioners) who all work together to provide you with the care you need, when you need it.   You may see any of the following providers on your designated Care Team at your next follow up: Dr Arvilla Meres Dr Marca Ancona Dr. Marcos Eke,  NP Robbie Lis, [place] [facility] [place], [place] Brynda Peon, NP Karle Plumber, PharmD   Please be sure to bring in all your medications bottles to every appointment.    Thank you for choosing [facility]

## 2022-08-26 NOTE — Progress Notes (Signed)
  Echocardiogram 2D Echocardiogram has been performed.  Milda Smart 08/26/2022, 11:12 AM

## 2022-08-27 ENCOUNTER — Telehealth (HOSPITAL_COMMUNITY): Payer: Self-pay | Admitting: Licensed Clinical Social Worker

## 2022-08-27 NOTE — Telephone Encounter (Signed)
CSW sent updates regarding yesterdays appt to [facility] program  Burna Sis, LCSW Clinical Social Worker [facility] Desk#: [phone] Cell#: [phone]

## 2022-08-27 NOTE — Progress Notes (Signed)
PCP: Dr. Jeanice Lim Cardiology: [name]: Dr. Shirlee Latch  77 y.o. with history of cardiomyopathy (never had full workup of etiology), HTN, CKD, paroxysmal atrial fibrillation and NSVT was referred by Randall An, PA, for evaluation of CHF.  He has had a cardiomyopathy since at least 2012, echo at that time showed EF 30-35%.  EF has been low since that time, most recent echo was in 9/21 with LV EF <15%, moderate-severe likely functional MR, and moderate RV dysfunction.  He has a history of NSVT and is on amiodarone.  He has never had a full workup for etiology of cardiomyopathy (no cath, cMRI, etc).  The prior notes report significant noncompliance with cardiology followup though he seems to be taking his medications appropriately with the help of his sister.  He was never implanted with an ICD due to noncompliance per the notes. He has not been able to take Entresto due to hives.  He was hospitalized in 9/21 at [facility] with CHF and atrial fibrillation/RVR.  He had cardiogenic shock and was on norepinephrine transiently.  He was cardioverted during that hospitalization.   In 2022, we had planned to investigate his cardiomyopathy with [facility] and cMRI. However, he called and cancelled all of his testing and follow ups.   Admitted 3/24 with CHF and rate controlled AF. Placed on amiodarone. Echo showed EF 25%, severe LV dysfunction, mild LVH, RV severely reduced and enlarged, moderate TR and moderate to severe MR. He was diuresed with IV lasix and GDMT titrated, but limited by AKI. DCCV not pursued due to questionable compliance with Eliquis. He was discharged home, weight 211 lbs.  Seen post hospital follow up 4/24, he was markedly overloaded and in AF with RVR. He was admitted from clinic, started on IV amiodarone and diuresed with IV lasix. Underwent successful DCCV to NSR. Drips weaned and GDMT titrated. He as discharged home w/ paramedicine, weight 190 lbs.   Follow up  6/24, stable NYHA II but back in AF, rate controlled. Started on amiodarone 200 bid.  Echo was done today and reviewed, EF 30%, ?LV noncompaction, mild LV dilation, mild LVH, mildly decreased RV systolic function, moderate central MR.   Today he returns for HF follow up with his sister.  Weight is up 3 lbs.  Overall, he feels good. Has been mowing his grass this summer.  No problems walking on flat ground or up a flight of stairs.  No chest pain.  No lightheadedness.  No orthopnea/PND. He remains in atrial fibrillation today, does not feel palpitations.   ECG (personally reviewed): atrial fibrillation, LAFB, anterolateral TWIs.   Labs (6/21): creatinine 1.48 Labs (9/21): creatinine 3.1 Labs (3/22): K 3, creatinine 1.14 Labs (4/24): K 3.8, creatinine 2.40 Labs (6/24): TSH normal Labs (7/24): K 4.5, creatinine 1.9, LDL 57, LFTs normal, hgb 15.3, plts 108  PMH: 1. Cardiomyopathy: Presumed nonischemic  - Echo (2012) with EF 30-35% - Echo (6/17) with EF 15-20% - Echo (9/21) with EF < 15%, moderate-severe MR, moderate RV dilation with moderately decreased RV systolic function, PASP 56 mmHg.  - Echo (3/24): EF 25%, severe LV dysfunction, mild LVH, RV severely reduced and enlarged, moderate TR and moderate to severe MR.  - Echo (7/24): EF 30%, ?LV noncompaction, mild LV dilation, mild LVH, mildly decreased RV systolic function, moderate central MR.  - Hives with Entresto 2. H/o NSVT: On amiodarone, no ICD placed per notes due to poor compliance.  3. Mitral regurgitation: Moderate-severe on 9/21 and 3/24  echo, suspect functional. 4. HTN 5. Hyperlipidemia 6. Type 2 diabetes 7. CKD stage 3.  8. Gout 9. Atrial fibrillation: Paroxysmal.  DCCV in 12/19 and again in 9/21 during hospitalization at [facility].  - DCCV 4/24 10. Chronic thrombocytopenia.   Social History   Socioeconomic History   Marital status: Single    Spouse name: Not on file   Number of children: 2   Years of education: Not on  file   Highest education level: Not on file  Occupational History   Occupation: Maintenance department    Comment: [employer]  Tobacco Use   Smoking status: Never   Smokeless tobacco: Never  Vaping Use   Vaping status: Never Used  Substance and Sexual Activity   Alcohol use: Yes    Alcohol/week: 14.0 standard drinks of alcohol    Types: 4 Cans of beer, 10 Standard drinks or equivalent per week    Comment: stopped 2019   Drug use: No   Sexual activity: Yes  Other Topics Concern   Not on file  Social History Narrative   Not on file   Social Determinants of Health   Financial Resource Strain: Low Risk  (05/07/2022)   Overall Financial Resource Strain (CARDIA)    Difficulty of Paying Living Expenses: Not hard at all  Food Insecurity: Food Insecurity Present (05/08/2022)   Hunger Vital Sign    Worried About Running Out of Food in the Last Year: Sometimes true    Ran Out of Food in the Last Year: Sometimes true  Transportation Needs: No Transportation Needs (06/16/2022)   PRAPARE - Administrator, Civil Service (Medical): No    Lack of Transportation (Non-Medical): No  Physical Activity: Inactive (05/07/2022)   Exercise Vital Sign    Days of Exercise per Week: 0 days    Minutes of Exercise per Session: 0 min  Stress: No Stress Concern Present (05/07/2022)   [employer] of Occupational Health - Occupational Stress Questionnaire    Feeling of Stress : Not at all  Social Connections: Socially Isolated (05/07/2022)   Social Connection and Isolation Panel [NHANES]    Frequency of Communication with Friends and Family: More than three times a week    Frequency of Social Gatherings with Friends and Family: More than three times a week    Attends Religious Services: Never    Database administrator or Organizations: No    Attends Banker Meetings: Never    Marital Status: Never married  Intimate Partner Violence: Not At Risk (05/08/2022)   Humiliation, Afraid,  Rape, and Kick questionnaire    Fear of Current or Ex-Partner: No    Emotionally Abused: No    Physically Abused: No    Sexually Abused: No   Family History  Problem Relation Age of Onset   Dementia Mother    COPD Father    Diabetes Father    Coronary artery disease Father    ROS: All systems reviewed and negative except as per HPI.   Current Outpatient Medications  Medication Sig Dispense Refill   acetaminophen (TYLENOL) 325 MG tablet Take 325-650 mg by mouth every 6 (six) hours as needed for headache, mild pain or moderate pain.     allopurinol (ZYLOPRIM) 100 MG tablet TAKE 1/2 (ONE-HALF) TABLET BY MOUTH ONCE DAILY FOR GOUT (Patient taking differently: Take 50 mg by mouth daily. TAKE 1/2 (ONE-HALF) TABLET BY MOUTH ONCE DAILY FOR GOUT) 30 tablet 0   amiodarone (PACERONE) 200 MG tablet Take  1 tablet (200 mg total) by mouth daily. 90 tablet 3   apixaban (ELIQUIS) 5 MG TABS tablet Take 1 tablet (5 mg total) by mouth 2 (two) times daily. 180 tablet 1   atorvastatin (LIPITOR) 20 MG tablet Take 20 mg by mouth daily.     calcitRIOL (ROCALTROL) 0.25 MCG capsule .25 mcg 3 times a week. Monday , Wednesday, and Friday (Patient taking differently: Take 0.25 mcg by mouth every Monday, Wednesday, and Friday. .25 mcg 3 times a week. Monday , Wednesday, and Friday) 45 capsule 1   dapagliflozin propanediol (FARXIGA) 10 MG TABS tablet Take 1 tablet (10 mg total) by mouth daily. 30 tablet 11   hydrOXYzine (ATARAX) 25 MG tablet Take 25 mg by mouth at bedtime as needed.     levothyroxine (SYNTHROID) 75 MCG tablet Take 1 tablet (75 mcg total) by mouth daily at 6 (six) AM. 30 tablet 0   metoprolol succinate (TOPROL-XL) 25 MG 24 hr tablet TAKE 1 Tablet twice a day 180 tablet 1   tamsulosin (FLOMAX) 0.4 MG CAPS capsule Take 1 capsule (0.4 mg total) by mouth daily. 90 capsule 1   furosemide (LASIX) 20 MG tablet Take 2 tablets (40 mg total) by mouth daily. 90 tablet 3   isosorbide-hydrALAZINE (BIDIL) 20-37.5 MG  tablet Take 1 tablet by mouth 3 (three) times daily. 90 tablet 3   No current facility-administered medications for this encounter.   Wt Readings from Last 3 Encounters:  08/26/22 89 kg (196 lb 3.2 oz)  07/21/22 87.7 kg (193 lb 6.4 oz)  07/02/22 87.2 kg (192 lb 3.2 oz)   BP (!) 162/90   Pulse 80   Wt 89 kg (196 lb 3.2 oz)   SpO2 97%   BMI 29.83 kg/m  General: NAD Neck: JVP 8 cm, no thyromegaly or thyroid nodule.  Lungs: Mild crackles at bases.  CV: Nondisplaced PMI.  Heart irregular S1/S2, no S3/S4, no murmur.  No peripheral edema.  No carotid bruit.  Normal pedal pulses.  Abdomen: Soft, nontender, no hepatosplenomegaly, no distention.  Skin: Intact without lesions or rashes.  Neurologic: Alert and oriented x 3.  Psych: Normal affect. Extremities: No clubbing or cyanosis.  HEENT: Normal.   Assessment/Plan: 1. Chronic Systolic Heart Failure: Long-standing cardiomyopathy of uncertain etiology (never had full workup of etiology). Echo 9/21 with LV EF <15%, moderate-severe likely functional MR, and moderate RV dysfunction. This echo was apparently done in the setting of atrial fibrillation with RVR (at [facility]). As his cardiomyopathy pre-dates identification of atrial fibrillation, unlikely to have a tachy-mediated cardiomyopathy.  Echo 3/24 EF 25%, severe LV dysfunction, mild LVH, RV severely reduced and enlarged, moderate TR and moderate to severe MR. Echo today showed EF 30%, ?LV noncompaction, mild LV dilation, mild LVH, mildly decreased RV systolic function, moderate central MR.  We have been unable to investigate etiology for his cardiomyopathy. He has not had R/LHC (refused to have this done), unable to obtain cMRI as he has "buckshot pellets" throughout chest. NYHA II, he is mildly volume overloaded on exam.  - Increase Bidil to 1 tab tid. - Increase Lasix to 40 mg daily, BMET in 10 days.  - Continue Farxiga 10 mg daily - Continue Toprol XL 25 mg bid. - Intolerant to [facility] in the  past (hives) - If creatinine remains stable, would start spironolactone next.  - Consider ICD in the future, compliance has improved.  2. Atrial Fibrillation: Persistent. S/p DCCV to NSR (4/24). He was back in AF at  follow up 06/29/22, and amiodarone was started. Unfortunately he remains in AF today, rate controlled.  - Continue Eliquis 5 mg bid.  - Continue amiodarone 200 mg daily, recent LFTs and TSH normal.  He will need a regular eye exam.  -I will arrange for repeat DCCV on amiodarone.  We discussed risks/benefits and he agrees to procedure.  - He will need EP referral for consideration of atrial fibrillation ablation.  - He refused sleep study.  3.  CKD IIIb: Baseline SCr 1.8-1.9. He is followed by Dr. Wolfgang Phoenix. - Continue SGLT2i.  4. Mitral Regurgitation: Moderate on today's echo. Suspect functional. 5. Hypothyroidism: Levoxyl followed by PCP.  6. Type 2 DM: Continue SGLT2i 7. TTP: Followed by Dr. Ellin Saba    Follow up 1 month with APP.  Marca Ancona  08/27/22

## 2022-08-27 NOTE — H&P (View-Only) (Signed)
 PCP: Dr. Jeanice Lim Cardiology: [name]: Dr. Shirlee Latch  77 y.o. with history of cardiomyopathy (never had full workup of etiology), HTN, CKD, paroxysmal atrial fibrillation and NSVT was referred by Randall An, PA, for evaluation of CHF.  He has had a cardiomyopathy since at least 2012, echo at that time showed EF 30-35%.  EF has been low since that time, most recent echo was in 9/21 with LV EF <15%, moderate-severe likely functional MR, and moderate RV dysfunction.  He has a history of NSVT and is on amiodarone.  He has never had a full workup for etiology of cardiomyopathy (no cath, cMRI, etc).  The prior notes report significant noncompliance with cardiology followup though he seems to be taking his medications appropriately with the help of his sister.  He was never implanted with an ICD due to noncompliance per the notes. He has not been able to take Entresto due to hives.  He was hospitalized in 9/21 at [facility] with CHF and atrial fibrillation/RVR.  He had cardiogenic shock and was on norepinephrine transiently.  He was cardioverted during that hospitalization.   In 2022, we had planned to investigate his cardiomyopathy with [facility] and cMRI. However, he called and cancelled all of his testing and follow ups.   Admitted 3/24 with CHF and rate controlled AF. Placed on amiodarone. Echo showed EF 25%, severe LV dysfunction, mild LVH, RV severely reduced and enlarged, moderate TR and moderate to severe MR. He was diuresed with IV lasix and GDMT titrated, but limited by AKI. DCCV not pursued due to questionable compliance with Eliquis. He was discharged home, weight 211 lbs.  Seen post hospital follow up 4/24, he was markedly overloaded and in AF with RVR. He was admitted from clinic, started on IV amiodarone and diuresed with IV lasix. Underwent successful DCCV to NSR. Drips weaned and GDMT titrated. He as discharged home w/ paramedicine, weight 190 lbs.   Follow up  6/24, stable NYHA II but back in AF, rate controlled. Started on amiodarone 200 bid.  Echo was done today and reviewed, EF 30%, ?LV noncompaction, mild LV dilation, mild LVH, mildly decreased RV systolic function, moderate central MR.   Today he returns for HF follow up with his sister.  Weight is up 3 lbs.  Overall, he feels good. Has been mowing his grass this summer.  No problems walking on flat ground or up a flight of stairs.  No chest pain.  No lightheadedness.  No orthopnea/PND. He remains in atrial fibrillation today, does not feel palpitations.   ECG (personally reviewed): atrial fibrillation, LAFB, anterolateral TWIs.   Labs (6/21): creatinine 1.48 Labs (9/21): creatinine 3.1 Labs (3/22): K 3, creatinine 1.14 Labs (4/24): K 3.8, creatinine 2.40 Labs (6/24): TSH normal Labs (7/24): K 4.5, creatinine 1.9, LDL 57, LFTs normal, hgb 15.3, plts 108  PMH: 1. Cardiomyopathy: Presumed nonischemic  - Echo (2012) with EF 30-35% - Echo (6/17) with EF 15-20% - Echo (9/21) with EF < 15%, moderate-severe MR, moderate RV dilation with moderately decreased RV systolic function, PASP 56 mmHg.  - Echo (3/24): EF 25%, severe LV dysfunction, mild LVH, RV severely reduced and enlarged, moderate TR and moderate to severe MR.  - Echo (7/24): EF 30%, ?LV noncompaction, mild LV dilation, mild LVH, mildly decreased RV systolic function, moderate central MR.  - Hives with Entresto 2. H/o NSVT: On amiodarone, no ICD placed per notes due to poor compliance.  3. Mitral regurgitation: Moderate-severe on 9/21 and 3/24  echo, suspect functional. 4. HTN 5. Hyperlipidemia 6. Type 2 diabetes 7. CKD stage 3.  8. Gout 9. Atrial fibrillation: Paroxysmal.  DCCV in 12/19 and again in 9/21 during hospitalization at [facility].  - DCCV 4/24 10. Chronic thrombocytopenia.   Social History   Socioeconomic History   Marital status: Single    Spouse name: Not on file   Number of children: 2   Years of education: Not on  file   Highest education level: Not on file  Occupational History   Occupation: Maintenance department    Comment: [employer]  Tobacco Use   Smoking status: Never   Smokeless tobacco: Never  Vaping Use   Vaping status: Never Used  Substance and Sexual Activity   Alcohol use: Yes    Alcohol/week: 14.0 standard drinks of alcohol    Types: 4 Cans of beer, 10 Standard drinks or equivalent per week    Comment: stopped 2019   Drug use: No   Sexual activity: Yes  Other Topics Concern   Not on file  Social History Narrative   Not on file   Social Determinants of Health   Financial Resource Strain: Low Risk  (05/07/2022)   Overall Financial Resource Strain (CARDIA)    Difficulty of Paying Living Expenses: Not hard at all  Food Insecurity: Food Insecurity Present (05/08/2022)   Hunger Vital Sign    Worried About Running Out of Food in the Last Year: Sometimes true    Ran Out of Food in the Last Year: Sometimes true  Transportation Needs: No Transportation Needs (06/16/2022)   PRAPARE - Administrator, Civil Service (Medical): No    Lack of Transportation (Non-Medical): No  Physical Activity: Inactive (05/07/2022)   Exercise Vital Sign    Days of Exercise per Week: 0 days    Minutes of Exercise per Session: 0 min  Stress: No Stress Concern Present (05/07/2022)   [employer] of Occupational Health - Occupational Stress Questionnaire    Feeling of Stress : Not at all  Social Connections: Socially Isolated (05/07/2022)   Social Connection and Isolation Panel [NHANES]    Frequency of Communication with Friends and Family: More than three times a week    Frequency of Social Gatherings with Friends and Family: More than three times a week    Attends Religious Services: Never    Database administrator or Organizations: No    Attends Banker Meetings: Never    Marital Status: Never married  Intimate Partner Violence: Not At Risk (05/08/2022)   Humiliation, Afraid,  Rape, and Kick questionnaire    Fear of Current or Ex-Partner: No    Emotionally Abused: No    Physically Abused: No    Sexually Abused: No   Family History  Problem Relation Age of Onset   Dementia Mother    COPD Father    Diabetes Father    Coronary artery disease Father    ROS: All systems reviewed and negative except as per HPI.   Current Outpatient Medications  Medication Sig Dispense Refill   acetaminophen (TYLENOL) 325 MG tablet Take 325-650 mg by mouth every 6 (six) hours as needed for headache, mild pain or moderate pain.     allopurinol (ZYLOPRIM) 100 MG tablet TAKE 1/2 (ONE-HALF) TABLET BY MOUTH ONCE DAILY FOR GOUT (Patient taking differently: Take 50 mg by mouth daily. TAKE 1/2 (ONE-HALF) TABLET BY MOUTH ONCE DAILY FOR GOUT) 30 tablet 0   amiodarone (PACERONE) 200 MG tablet Take  1 tablet (200 mg total) by mouth daily. 90 tablet 3   apixaban (ELIQUIS) 5 MG TABS tablet Take 1 tablet (5 mg total) by mouth 2 (two) times daily. 180 tablet 1   atorvastatin (LIPITOR) 20 MG tablet Take 20 mg by mouth daily.     calcitRIOL (ROCALTROL) 0.25 MCG capsule .25 mcg 3 times a week. Monday , Wednesday, and Friday (Patient taking differently: Take 0.25 mcg by mouth every Monday, Wednesday, and Friday. .25 mcg 3 times a week. Monday , Wednesday, and Friday) 45 capsule 1   dapagliflozin propanediol (FARXIGA) 10 MG TABS tablet Take 1 tablet (10 mg total) by mouth daily. 30 tablet 11   hydrOXYzine (ATARAX) 25 MG tablet Take 25 mg by mouth at bedtime as needed.     levothyroxine (SYNTHROID) 75 MCG tablet Take 1 tablet (75 mcg total) by mouth daily at 6 (six) AM. 30 tablet 0   metoprolol succinate (TOPROL-XL) 25 MG 24 hr tablet TAKE 1 Tablet twice a day 180 tablet 1   tamsulosin (FLOMAX) 0.4 MG CAPS capsule Take 1 capsule (0.4 mg total) by mouth daily. 90 capsule 1   furosemide (LASIX) 20 MG tablet Take 2 tablets (40 mg total) by mouth daily. 90 tablet 3   isosorbide-hydrALAZINE (BIDIL) 20-37.5 MG  tablet Take 1 tablet by mouth 3 (three) times daily. 90 tablet 3   No current facility-administered medications for this encounter.   Wt Readings from Last 3 Encounters:  08/26/22 89 kg (196 lb 3.2 oz)  07/21/22 87.7 kg (193 lb 6.4 oz)  07/02/22 87.2 kg (192 lb 3.2 oz)   BP (!) 162/90   Pulse 80   Wt 89 kg (196 lb 3.2 oz)   SpO2 97%   BMI 29.83 kg/m  General: NAD Neck: JVP 8 cm, no thyromegaly or thyroid nodule.  Lungs: Mild crackles at bases.  CV: Nondisplaced PMI.  Heart irregular S1/S2, no S3/S4, no murmur.  No peripheral edema.  No carotid bruit.  Normal pedal pulses.  Abdomen: Soft, nontender, no hepatosplenomegaly, no distention.  Skin: Intact without lesions or rashes.  Neurologic: Alert and oriented x 3.  Psych: Normal affect. Extremities: No clubbing or cyanosis.  HEENT: Normal.   Assessment/Plan: 1. Chronic Systolic Heart Failure: Long-standing cardiomyopathy of uncertain etiology (never had full workup of etiology). Echo 9/21 with LV EF <15%, moderate-severe likely functional MR, and moderate RV dysfunction. This echo was apparently done in the setting of atrial fibrillation with RVR (at [facility]). As his cardiomyopathy pre-dates identification of atrial fibrillation, unlikely to have a tachy-mediated cardiomyopathy.  Echo 3/24 EF 25%, severe LV dysfunction, mild LVH, RV severely reduced and enlarged, moderate TR and moderate to severe MR. Echo today showed EF 30%, ?LV noncompaction, mild LV dilation, mild LVH, mildly decreased RV systolic function, moderate central MR.  We have been unable to investigate etiology for his cardiomyopathy. He has not had R/LHC (refused to have this done), unable to obtain cMRI as he has "buckshot pellets" throughout chest. NYHA II, he is mildly volume overloaded on exam.  - Increase Bidil to 1 tab tid. - Increase Lasix to 40 mg daily, BMET in 10 days.  - Continue Farxiga 10 mg daily - Continue Toprol XL 25 mg bid. - Intolerant to [facility] in the  past (hives) - If creatinine remains stable, would start spironolactone next.  - Consider ICD in the future, compliance has improved.  2. Atrial Fibrillation: Persistent. S/p DCCV to NSR (4/24). He was back in AF at  follow up 06/29/22, and amiodarone was started. Unfortunately he remains in AF today, rate controlled.  - Continue Eliquis 5 mg bid.  - Continue amiodarone 200 mg daily, recent LFTs and TSH normal.  He will need a regular eye exam.  -I will arrange for repeat DCCV on amiodarone.  We discussed risks/benefits and he agrees to procedure.  - He will need EP referral for consideration of atrial fibrillation ablation.  - He refused sleep study.  3.  CKD IIIb: Baseline SCr 1.8-1.9. He is followed by Dr. Wolfgang Phoenix. - Continue SGLT2i.  4. Mitral Regurgitation: Moderate on today's echo. Suspect functional. 5. Hypothyroidism: Levoxyl followed by PCP.  6. Type 2 DM: Continue SGLT2i 7. TTP: Followed by Dr. Ellin Saba    Follow up 1 month with APP.  Marca Ancona  08/27/22

## 2022-09-09 NOTE — Anesthesia Preprocedure Evaluation (Addendum)
Anesthesia Evaluation  Patient identified by MRN, date of birth, ID band Patient awake    Reviewed: Allergy & Precautions, H&P , NPO status , Patient's Chart, lab work & pertinent test results, reviewed documented beta blocker date and time   Airway Mallampati: II  TM Distance: >3 FB Neck ROM: full    Dental no notable dental hx.    Pulmonary neg pulmonary ROS   Pulmonary exam normal breath sounds clear to auscultation       Cardiovascular Exercise Tolerance: Good hypertension, Pt. on medications +CHF  + dysrhythmias Atrial Fibrillation + Valvular Problems/Murmurs MR  Rhythm:irregular Rate:Bradycardia     Neuro/Psych negative neurological ROS  negative psych ROS   GI/Hepatic Neg liver ROS, hiatal hernia,GERD  Medicated,,  Endo/Other  negative endocrine ROSdiabetes    Renal/GU CRFRenal disease  negative genitourinary   Musculoskeletal  (+) Arthritis , Osteoarthritis,    Abdominal   Peds  Hematology  (+) Blood dyscrasia, anemia   Anesthesia Other Findings   Reproductive/Obstetrics negative OB ROS                             Anesthesia Physical Anesthesia Plan  ASA: 4  Anesthesia Plan: General   Post-op Pain Management: Minimal or no pain anticipated   Induction: Intravenous  PONV Risk Score and Plan: 2 and Treatment may vary due to age or medical condition  Airway Management Planned: Mask  Additional Equipment:   Intra-op Plan:   Post-operative Plan:   Informed Consent: I have reviewed the patients History and Physical, chart, labs and discussed the procedure including the risks, benefits and alternatives for the proposed anesthesia with the patient or authorized representative who has indicated his/her understanding and acceptance.     Dental Advisory Given  Plan Discussed with: CRNA  Anesthesia Plan Comments: (25% LVEF (?LV noncompaction), mild RV dysfunction (severe  on prior echo), moderate MR. )        Anesthesia Quick Evaluation

## 2022-09-11 NOTE — Progress Notes (Signed)
Attempted to call patient, however pt did not answer and his voicemail is not set up.

## 2022-09-12 ENCOUNTER — Ambulatory Visit (HOSPITAL_COMMUNITY)
Admission: RE | Admit: 2022-09-12 | Discharge: 2022-09-12 | Disposition: A | Discharge status: Home / Self Care | Payer: 59 | Attending: Cardiology | Admitting: Cardiology

## 2022-09-12 ENCOUNTER — Ambulatory Visit (HOSPITAL_BASED_OUTPATIENT_CLINIC_OR_DEPARTMENT_OTHER): Payer: 59 | Admitting: Anesthesiology

## 2022-09-12 ENCOUNTER — Other Ambulatory Visit (HOSPITAL_COMMUNITY): Payer: Self-pay | Admitting: Adult Health

## 2022-09-12 ENCOUNTER — Inpatient Hospital Stay (HOSPITAL_BASED_OUTPATIENT_CLINIC_OR_DEPARTMENT_OTHER)
Admit: 2022-09-12 | Discharge: 2022-09-12 | Disposition: A | Discharge status: Home / Self Care | Payer: 59 | Attending: Adult Health | Admitting: Adult Health

## 2022-09-12 ENCOUNTER — Other Ambulatory Visit: Payer: Self-pay

## 2022-09-12 ENCOUNTER — Ambulatory Visit (HOSPITAL_COMMUNITY): Payer: 59 | Admitting: Anesthesiology

## 2022-09-12 ENCOUNTER — Encounter (HOSPITAL_COMMUNITY)
Admission: RE | Disposition: A | Discharge status: Home / Self Care | Payer: Self-pay | Source: Home / Self Care | Attending: Cardiology

## 2022-09-12 DIAGNOSIS — I4891 Unspecified atrial fibrillation: Secondary | ICD-10-CM | POA: Diagnosis not present

## 2022-09-12 DIAGNOSIS — M3119 Other thrombotic microangiopathy: Secondary | ICD-10-CM | POA: Insufficient documentation

## 2022-09-12 DIAGNOSIS — N1832 Chronic kidney disease, stage 3b: Secondary | ICD-10-CM | POA: Insufficient documentation

## 2022-09-12 DIAGNOSIS — I4819 Other persistent atrial fibrillation: Secondary | ICD-10-CM | POA: Diagnosis not present

## 2022-09-12 DIAGNOSIS — E782 Mixed hyperlipidemia: Secondary | ICD-10-CM | POA: Diagnosis not present

## 2022-09-12 DIAGNOSIS — I480 Paroxysmal atrial fibrillation: Secondary | ICD-10-CM

## 2022-09-12 DIAGNOSIS — E1122 Type 2 diabetes mellitus with diabetic chronic kidney disease: Secondary | ICD-10-CM | POA: Diagnosis not present

## 2022-09-12 DIAGNOSIS — I130 Hypertensive heart and chronic kidney disease with heart failure and stage 1 through stage 4 chronic kidney disease, or unspecified chronic kidney disease: Secondary | ICD-10-CM | POA: Insufficient documentation

## 2022-09-12 DIAGNOSIS — I5022 Chronic systolic (congestive) heart failure: Secondary | ICD-10-CM

## 2022-09-12 DIAGNOSIS — E039 Hypothyroidism, unspecified: Secondary | ICD-10-CM | POA: Insufficient documentation

## 2022-09-12 DIAGNOSIS — Z79899 Other long term (current) drug therapy: Secondary | ICD-10-CM | POA: Diagnosis not present

## 2022-09-12 DIAGNOSIS — I340 Nonrheumatic mitral (valve) insufficiency: Secondary | ICD-10-CM | POA: Insufficient documentation

## 2022-09-12 DIAGNOSIS — Z7901 Long term (current) use of anticoagulants: Secondary | ICD-10-CM | POA: Diagnosis not present

## 2022-09-12 DIAGNOSIS — Z79890 Hormone replacement therapy (postmenopausal): Secondary | ICD-10-CM | POA: Diagnosis not present

## 2022-09-12 HISTORY — PX: CARDIOVERSION: SHX1299

## 2022-09-12 LAB — GLUCOSE, CAPILLARY: Glucose-Capillary: 117 mg/dL — ABNORMAL HIGH (ref 70–99)

## 2022-09-12 SURGERY — CARDIOVERSION
Anesthesia: General

## 2022-09-12 MED ORDER — AMIODARONE HCL 200 MG PO TABS: 100.0000 mg | ORAL_TABLET | Freq: Every day | ORAL | 3 refills | Status: DC

## 2022-09-12 MED ORDER — LIDOCAINE 2% (20 MG/ML) 5 ML SYRINGE
INTRAMUSCULAR | Status: DC | PRN
  Administered 2022-09-12: 80 mg via INTRAVENOUS

## 2022-09-12 MED ORDER — APIXABAN 5 MG PO TABS
5.0000 mg | ORAL_TABLET | Freq: Two times a day (BID) | ORAL | Status: DC
  Administered 2022-09-12: 5 mg via ORAL
  Filled 2022-09-12: qty 1

## 2022-09-12 MED ORDER — SODIUM CHLORIDE 0.9 % IV SOLN
INTRAVENOUS | Status: DC
  Administered 2022-09-12: 20 mL/h via INTRAVENOUS

## 2022-09-12 MED ORDER — PROPOFOL 10 MG/ML IV BOLUS
INTRAVENOUS | Status: DC | PRN
Start: 2022-09-12 — End: 2022-09-12
  Administered 2022-09-12: 80 mg via INTRAVENOUS

## 2022-09-12 SURGICAL SUPPLY — 1 items: ELECT DEFIB PAD ADLT CADENCE (PAD) ×2 IMPLANT

## 2022-09-12 NOTE — Anesthesia Postprocedure Evaluation (Signed)
Anesthesia Post Note  Patient: Phillip Wilson  Procedure(s) Performed: CARDIOVERSION     Patient location during evaluation: PACU Anesthesia Type: General Level of consciousness: awake and alert Pain management: pain level controlled Vital Signs Assessment: post-procedure vital signs reviewed and stable Respiratory status: spontaneous breathing, nonlabored ventilation and respiratory function stable Cardiovascular status: blood pressure returned to baseline and stable Postop Assessment: no apparent nausea or vomiting Anesthetic complications: no   No notable events documented.  Last Vitals:  Vitals:   09/12/22 0750 09/12/22 0755  BP: 114/71 128/67  Pulse: (!) 40 (!) 43  Resp: (!) 0 (!) 8  Temp:    SpO2: 97% 96%    Last Pain:  Vitals:   09/12/22 0747  TempSrc: Temporal  PainSc: 0-No pain                 Lowella Curb

## 2022-09-12 NOTE — Discharge Instructions (Signed)
1. Stop Toprol XL.  2. Decrease amiodarone to 100 mg daily (1/2 tablet).

## 2022-09-12 NOTE — Interval H&P Note (Signed)
History and Physical Interval Note:  09/12/2022 7:23 AM  Phillip Wilson  has presented today for surgery, with the diagnosis of AFIB.  The various methods of treatment have been discussed with the patient and family. After consideration of risks, benefits and other options for treatment, the patient has consented to  Procedure(s): CARDIOVERSION (N/A) as a surgical intervention.  The patient's history has been reviewed, patient examined, no change in status, stable for surgery.  I have reviewed the patient's chart and labs.  Questions were answered to the patient's satisfaction.     Rahmel Nedved [employer]

## 2022-09-12 NOTE — OR Nursing (Signed)
Zio monitor placed by EKG tech. Patient stable. Discharged with asymptomatic Junctional Bradycardia. BP 136/72

## 2022-09-12 NOTE — Procedures (Signed)
Electrical Cardioversion Procedure Note Phillip Wilson [id number] 1945/04/03  Procedure: Electrical Cardioversion Indications:  Atrial Fibrillation  Procedure Details Consent: Risks of procedure as well as the alternatives and risks of each were explained to the (patient/caregiver).  Consent for procedure obtained. Time Out: Verified patient identification, verified procedure, site/side was marked, verified correct patient position, special equipment/implants available, medications/allergies/relevent history reviewed, required imaging and test results available.  Performed  Patient placed on cardiac monitor, pulse oximetry, supplemental oxygen as necessary.  Sedation given:  Propofol per anesthesiology Pacer pads placed anterior and posterior chest.  Cardioverted 1 time(s).  Cardioverted at 200J.  Evaluation Findings: Post procedure EKG shows: NSR in 40s with periodic Wenckebach periodicity and junctional beats.  BP stable.  Complications: None Patient did tolerate procedure well.  Will try to arrange Live Zio monitor at discharge given bradycardia/Wenckebach.    Phillip Wilson 09/12/2022, 7:38 AM

## 2022-09-12 NOTE — Progress Notes (Signed)
  Zio AT ordered for discharge.   Taila Basinski NP-C  8:44 AM

## 2022-09-12 NOTE — Transfer of Care (Signed)
Immediate Anesthesia Transfer of Care Note  Patient: Phillip Wilson  Procedure(s) Performed: CARDIOVERSION  Patient Location: Cath Lab  Anesthesia Type:General  Level of Consciousness: drowsy  Airway & Oxygen Therapy: Patient Spontanous Breathing and Patient connected to nasal cannula oxygen  Post-op Assessment: Report given to RN and Post -op Vital signs reviewed and stable  Post vital signs: Reviewed and stable  Last Vitals:  Vitals Value Taken Time  BP 121/60   Temp    Pulse 39   Resp 12   SpO2 97     Last Pain:  Vitals:   09/12/22 0633  TempSrc: Temporal  PainSc: 0-No pain         Complications: No notable events documented.

## 2022-09-13 DIAGNOSIS — I4819 Other persistent atrial fibrillation: Secondary | ICD-10-CM | POA: Diagnosis not present

## 2022-09-15 ENCOUNTER — Encounter (HOSPITAL_COMMUNITY): Payer: Self-pay | Admitting: Cardiology

## 2022-09-24 ENCOUNTER — Ambulatory Visit (HOSPITAL_COMMUNITY): Admission: RE | Admit: 2022-09-24 | Payer: 59 | Source: Ambulatory Visit

## 2022-09-24 ENCOUNTER — Encounter (HOSPITAL_COMMUNITY): Payer: Self-pay

## 2022-09-24 VITALS — BP 152/70 | HR 65 | Wt 194.8 lb

## 2022-09-24 DIAGNOSIS — Z7984 Long term (current) use of oral hypoglycemic drugs: Secondary | ICD-10-CM | POA: Insufficient documentation

## 2022-09-24 DIAGNOSIS — M3119 Other thrombotic microangiopathy: Secondary | ICD-10-CM | POA: Diagnosis not present

## 2022-09-24 DIAGNOSIS — Z8679 Personal history of other diseases of the circulatory system: Secondary | ICD-10-CM | POA: Diagnosis not present

## 2022-09-24 DIAGNOSIS — E039 Hypothyroidism, unspecified: Secondary | ICD-10-CM | POA: Insufficient documentation

## 2022-09-24 DIAGNOSIS — I480 Paroxysmal atrial fibrillation: Secondary | ICD-10-CM | POA: Diagnosis not present

## 2022-09-24 DIAGNOSIS — I4819 Other persistent atrial fibrillation: Secondary | ICD-10-CM | POA: Diagnosis not present

## 2022-09-24 DIAGNOSIS — I130 Hypertensive heart and chronic kidney disease with heart failure and stage 1 through stage 4 chronic kidney disease, or unspecified chronic kidney disease: Secondary | ICD-10-CM | POA: Insufficient documentation

## 2022-09-24 DIAGNOSIS — Z79899 Other long term (current) drug therapy: Secondary | ICD-10-CM | POA: Diagnosis not present

## 2022-09-24 DIAGNOSIS — I5022 Chronic systolic (congestive) heart failure: Secondary | ICD-10-CM | POA: Diagnosis not present

## 2022-09-24 DIAGNOSIS — Z7901 Long term (current) use of anticoagulants: Secondary | ICD-10-CM | POA: Insufficient documentation

## 2022-09-24 DIAGNOSIS — I340 Nonrheumatic mitral (valve) insufficiency: Secondary | ICD-10-CM | POA: Diagnosis not present

## 2022-09-24 DIAGNOSIS — E1122 Type 2 diabetes mellitus with diabetic chronic kidney disease: Secondary | ICD-10-CM | POA: Diagnosis not present

## 2022-09-24 DIAGNOSIS — Z79890 Hormone replacement therapy (postmenopausal): Secondary | ICD-10-CM | POA: Insufficient documentation

## 2022-09-24 DIAGNOSIS — N1832 Chronic kidney disease, stage 3b: Secondary | ICD-10-CM | POA: Diagnosis not present

## 2022-09-24 LAB — BRAIN NATRIURETIC PEPTIDE: B Natriuretic Peptide: 120.5 pg/mL — ABNORMAL HIGH (ref 0.0–100.0)

## 2022-09-24 LAB — BASIC METABOLIC PANEL
Anion gap: 11 (ref 5–15)
BUN: 18 mg/dL (ref 8–23)
CO2: 26 mmol/L (ref 22–32)
Calcium: 9 mg/dL (ref 8.9–10.3)
Chloride: 103 mmol/L (ref 98–111)
Creatinine, Ser: 1.91 mg/dL — ABNORMAL HIGH (ref 0.61–1.24)
GFR, Estimated: 36 mL/min — ABNORMAL LOW (ref >60)
Glucose, Bld: 101 mg/dL — ABNORMAL HIGH (ref 70–99)
Potassium: 3.6 mmol/L (ref 3.5–5.1)
Sodium: 140 mmol/L (ref 135–145)

## 2022-09-24 LAB — PROTEIN / CREATININE RATIO, URINE
Creatinine, Urine: 53 mg/dL
Total Protein, Urine: <6 mg/dL

## 2022-09-24 LAB — URIC ACID: Uric Acid, Serum: 6.8 mg/dL (ref 3.7–8.6)

## 2022-09-24 MED ORDER — SPIRONOLACTONE 25 MG PO TABS: 12.5000 mg | ORAL_TABLET | Freq: Every day | ORAL | 3 refills | Status: DC

## 2022-09-24 NOTE — Progress Notes (Signed)
PCP: Dr. Jeanice Lim Cardiology: [name] Cardiology: Dr. Shirlee Latch  77 y.o. with history of cardiomyopathy (never had full workup of etiology), HTN, CKD, paroxysmal atrial fibrillation and NSVT was referred by Randall An, PA, for evaluation of CHF.  He has had a cardiomyopathy since at least 2012, echo at that time showed EF 30-35%.  EF has been low since that time, most recent echo was in 9/21 with LV EF <15%, moderate-severe likely functional MR, and moderate RV dysfunction.  He has a history of NSVT and is on amiodarone.  He has never had a full workup for etiology of cardiomyopathy (no cath, cMRI, etc).  The prior notes report significant noncompliance with cardiology followup though he seems to be taking his medications appropriately with the help of his sister.  He was never implanted with an ICD due to noncompliance per the notes. He has not been able to take Entresto due to hives.  He was hospitalized in 9/21 at [facility] with CHF and atrial fibrillation/RVR.  He had cardiogenic shock and was on norepinephrine transiently.  He was cardioverted during that hospitalization.   In 2022, we had planned to investigate his cardiomyopathy with [facility] and cMRI. However, he called and cancelled all of his testing and follow ups.   Admitted 3/24 with CHF and rate controlled AF. Placed on amiodarone. Echo showed EF 25%, severe LV dysfunction, mild LVH, RV severely reduced and enlarged, moderate TR and moderate to severe MR. He was diuresed with IV lasix and GDMT titrated, but limited by AKI. DCCV not pursued due to questionable compliance with Eliquis. He was discharged home, weight 211 lbs.  Seen post hospital follow up 4/24, he was markedly overloaded and in AF with RVR. He was admitted from clinic, started on IV amiodarone and diuresed with IV lasix. Underwent successful DCCV to NSR. Drips weaned and GDMT titrated. He as discharged home w/ paramedicine, weight 190 lbs.   Follow up  6/24, stable NYHA II but back in AF, rate controlled. Started on amiodarone 200 bid.  Echo 7/24 showed EF 30%, ?LV noncompaction, mild LV dilation, mild LVH, mildly decreased RV systolic function, moderate central MR.   Follow up 7/24, remained in AF. Arranged for repeat DCCV on 09/12/22--> NSR in 40s with periodic Wenckebach and junctional beats. Zio AT placed.   Today he returns for HF follow up with his sister. Overall feeling fine. He is not SOB push mowing his yard. Denies palpitations, abnormal bleeding, CP, dizziness, edema, or PND/Orthopnea. Appetite ok. No fever or chills. Weight at home 165 pounds. Taking all medications.   ECG (personally reviewed): NSR 61 bpm  ReDs: 42%  Labs (6/21): creatinine 1.48 Labs (9/21): creatinine 3.1 Labs (3/22): K 3, creatinine 1.14 Labs (4/24): K 3.8, creatinine 2.40 Labs (6/24): TSH normal Labs (7/24): K 4.5, creatinine 1.9, LDL 57, LFTs normal, hgb 15.3, plts 108  PMH: 1. Cardiomyopathy: Presumed nonischemic  - Echo (2012) with EF 30-35% - Echo (6/17) with EF 15-20% - Echo (9/21) with EF < 15%, moderate-severe MR, moderate RV dilation with moderately decreased RV systolic function, PASP 56 mmHg.  - Echo (3/24): EF 25%, severe LV dysfunction, mild LVH, RV severely reduced and enlarged, moderate TR and moderate to severe MR.  - Echo (7/24): EF 30%, ?LV noncompaction, mild LV dilation, mild LVH, mildly decreased RV systolic function, moderate central MR.  - Hives with Entresto 2. H/o NSVT: On amiodarone, no ICD placed per notes due to poor compliance.  3. Mitral  regurgitation: Moderate-severe on 9/21 and 3/24 echo, suspect functional. 4. HTN 5. Hyperlipidemia 6. Type 2 diabetes 7. CKD stage 3.  8. Gout 9. Atrial fibrillation: Paroxysmal.  DCCV in 12/19 and again in 9/21 during hospitalization at [facility].  - DCCV 4/24 - DCCV 8/24 10. Chronic thrombocytopenia.   Social History   Socioeconomic History   Marital status: Single    Spouse name:  Not on file   Number of children: 2   Years of education: Not on file   Highest education level: Not on file  Occupational History   Occupation: Maintenance department    Comment: [employer]  Tobacco Use   Smoking status: Never   Smokeless tobacco: Never  Vaping Use   Vaping status: Never Used  Substance and Sexual Activity   Alcohol use: Yes    Alcohol/week: 14.0 standard drinks of alcohol    Types: 4 Cans of beer, 10 Standard drinks or equivalent per week    Comment: stopped 2019   Drug use: No   Sexual activity: Yes  Other Topics Concern   Not on file  Social History Narrative   Not on file   Social Determinants of Health   Financial Resource Strain: Low Risk  (05/07/2022)   Overall Financial Resource Strain (CARDIA)    Difficulty of Paying Living Expenses: Not hard at all  Food Insecurity: Food Insecurity Present (05/08/2022)   Hunger Vital Sign    Worried About Running Out of Food in the Last Year: Sometimes true    Ran Out of Food in the Last Year: Sometimes true  Transportation Needs: No Transportation Needs (06/16/2022)   PRAPARE - Administrator, Civil Service (Medical): No    Lack of Transportation (Non-Medical): No  Physical Activity: Inactive (05/07/2022)   Exercise Vital Sign    Days of Exercise per Week: 0 days    Minutes of Exercise per Session: 0 min  Stress: No Stress Concern Present (05/07/2022)   [employer] of Occupational Health - Occupational Stress Questionnaire    Feeling of Stress : Not at all  Social Connections: Socially Isolated (05/07/2022)   Social Connection and Isolation Panel [NHANES]    Frequency of Communication with Friends and Family: More than three times a week    Frequency of Social Gatherings with Friends and Family: More than three times a week    Attends Religious Services: Never    Database administrator or Organizations: No    Attends Banker Meetings: Never    Marital Status: Never married  Intimate  Partner Violence: Not At Risk (05/08/2022)   Humiliation, Afraid, Rape, and Kick questionnaire    Fear of Current or Ex-Partner: No    Emotionally Abused: No    Physically Abused: No    Sexually Abused: No   Family History  Problem Relation Age of Onset   Dementia Mother    COPD Father    Diabetes Father    Coronary artery disease Father    ROS: All systems reviewed and negative except as per HPI.   Current Outpatient Medications  Medication Sig Dispense Refill   acetaminophen (TYLENOL) 325 MG tablet Take 325-650 mg by mouth every 6 (six) hours as needed for headache, mild pain or moderate pain.     allopurinol (ZYLOPRIM) 100 MG tablet TAKE 1/2 (ONE-HALF) TABLET BY MOUTH ONCE DAILY FOR GOUT (Patient taking differently: Take 50 mg by mouth daily. TAKE 1/2 (ONE-HALF) TABLET BY MOUTH ONCE DAILY FOR GOUT) 30 tablet  0   amiodarone (PACERONE) 200 MG tablet Take 0.5 tablets (100 mg total) by mouth daily. 90 tablet 3   apixaban (ELIQUIS) 5 MG TABS tablet Take 1 tablet (5 mg total) by mouth 2 (two) times daily. 180 tablet 1   atorvastatin (LIPITOR) 20 MG tablet Take 20 mg by mouth daily.     calcitRIOL (ROCALTROL) 0.25 MCG capsule .25 mcg 3 times a week. Monday , Wednesday, and Friday (Patient taking differently: Take 0.25 mcg by mouth every Monday, Wednesday, and Friday. .25 mcg 3 times a week. Monday , Wednesday, and Friday) 45 capsule 1   dapagliflozin propanediol (FARXIGA) 10 MG TABS tablet Take 1 tablet (10 mg total) by mouth daily. 30 tablet 11   furosemide (LASIX) 20 MG tablet Take 2 tablets (40 mg total) by mouth daily. 90 tablet 3   hydrOXYzine (ATARAX) 25 MG tablet Take 25 mg by mouth at bedtime as needed.     isosorbide-hydrALAZINE (BIDIL) 20-37.5 MG tablet Take 1 tablet by mouth 3 (three) times daily. 90 tablet 3   levothyroxine (SYNTHROID) 75 MCG tablet Take 1 tablet (75 mcg total) by mouth daily at 6 (six) AM. 30 tablet 0   tamsulosin (FLOMAX) 0.4 MG CAPS capsule Take 1 capsule (0.4  mg total) by mouth daily. 90 capsule 1   No current facility-administered medications for this encounter.   Wt Readings from Last 3 Encounters:  09/24/22 88.4 kg (194 lb 12.8 oz)  09/12/22 78.9 kg (174 lb)  08/26/22 89 kg (196 lb 3.2 oz)   BP (!) 152/70   Pulse 65   Wt 88.4 kg (194 lb 12.8 oz)   SpO2 94%   BMI 29.62 kg/m  Physical Exam General:  NAD. No resp difficulty, walked into clinic HEENT: Normal Neck: Supple. No JVD. Carotids 2+ bilat; no bruits. No lymphadenopathy or thryomegaly appreciated. Cor: PMI nondisplaced. Regular rate & rhythm. No rubs, gallops or murmurs. Lungs: Clear Abdomen: Soft, nontender, nondistended. No hepatosplenomegaly. No bruits or masses. Good bowel sounds. Extremities: No cyanosis, clubbing, rash, edema Neuro: Alert & oriented x 3, cranial nerves grossly intact. Moves all 4 extremities w/o difficulty. Affect pleasant.  Assessment/Plan: 1. Chronic Systolic Heart Failure: Long-standing cardiomyopathy of uncertain etiology (never had full workup of etiology). Echo 9/21 with LV EF <15%, moderate-severe likely functional MR, and moderate RV dysfunction. This echo was apparently done in the setting of atrial fibrillation with RVR (at [facility]). As his cardiomyopathy pre-dates identification of atrial fibrillation, unlikely to have a tachy-mediated cardiomyopathy.  Echo 3/24 EF 25%, severe LV dysfunction, mild LVH, RV severely reduced and enlarged, moderate TR and moderate to severe MR. Echo 7/24 showed EF 30%, ?LV noncompaction, mild LV dilation, mild LVH, mildly decreased RV systolic function, moderate central MR.  We have been unable to investigate etiology for his cardiomyopathy. He has not had R/LHC (refused to have this done), unable to obtain cMRI as he has "buckshot pellets" throughout chest. NYHA II, he is mildly volume overloaded on exam, ReDs 42% - Creatinine improved, start spironolactone 12.5 mg daily. BMET/BNP today, repeat BMET in 1 week - Continue Bidil  1 tab tid. - Continue Lasix 40 mg daily. - Continue Farxiga 10 mg daily - Continue Toprol XL 25 mg bid. - Intolerant to [facility] in the past (hives). - Refer to EP for ICD consideration, his compliance has improved.  2. Atrial Fibrillation: Persistent. S/p DCCV to NSR (4/24). He was back in AF at follow up 06/29/22, and amiodarone was started. Underwent repeat  DCCV to [facility] 8/24. Zio AT placed due to periodic Wenckebach and junctional beats. NSR on ECG today. Await Zio results, he is wearing monitor until 09/26/22. - Continue Eliquis 5 mg bid.  - Continue amiodarone 200 mg daily, recent LFTs and TSH normal.  He will need a regular eye exam.  - Refer to EP for consideration of atrial fibrillation ablation.  - He refused sleep study.  3.  CKD IIIb: Baseline SCr 1.8-1.9. He is followed by Dr. Wolfgang Phoenix. - Continue SGLT2i.  4. Mitral Regurgitation: Moderate on most recent echo. Suspect functional. 5. Hypothyroidism: Levoxyl followed by PCP.  6. Type 2 DM: Continue SGLT2i 7. TTP: Followed by Dr. Ellin Saba    Follow up in 4 weeks with PharmD for GDMT titration (increase BiDil if able) and 3-4 months with Dr. Shirlee Latch.  Anderson Malta [facility] FNP-BC 09/24/22

## 2022-09-24 NOTE — Progress Notes (Signed)
ReDS Vest / Clip - 09/24/22 1400       ReDS Vest / Clip   Station Marker C    Ruler Value 27.5    ReDS Value Range High volume overload    ReDS Actual Value 42

## 2022-09-24 NOTE — Patient Instructions (Signed)
Medication Changes:  START: SPIRONOLACTONE 12.5MG  ONCE DAILY   Lab Work:  Labs done today, your results will be available in MyChart, we will contact you for abnormal readings.  PLEASE GO TO [facility] IN ONE WEEK FOR REPEAT BLOOD WORK IN [place]  Referrals:  YOU HAVE BEEN REFERRED TO ELECTROPHYSIOLOGY THEY WILL REACH OUT TO YOU OR CALL TO ARRANGE THIS. PLEASE CALL us WITH ANY CONCERNS   Follow-Up in: IN 1 MONTH WITH PHARMD   THEN 4 MONTHS AS SCHEDULED WITH DR. Shirlee Latch  At the [facility], you and your health needs are our priority. We have a designated team specialized in the treatment of Heart Failure. This Care Team includes your primary Heart Failure Specialized Cardiologist (physician), Advanced Practice Providers (APPs- Physician Assistants and Nurse Practitioners), and Pharmacist who all work together to provide you with the care you need, when you need it.   You may see any of the following providers on your designated Care Team at your next follow up:  Dr. Arvilla Meres Dr. Marca Ancona Dr. Marcos Eke, NP Robbie Lis, [place] [facility] [place], [place] Brynda Peon, NP Karle Plumber, PharmD   Please be sure to bring in all your medications bottles to every appointment.   Need to Contact us:  If you have any questions or concerns before your next appointment please send [place] a message through [place] or call our office at [phone].    TO LEAVE A MESSAGE FOR THE NURSE SELECT OPTION 2, PLEASE LEAVE A MESSAGE INCLUDING: YOUR NAME DATE OF BIRTH CALL BACK NUMBER REASON FOR CALL**this is important as we prioritize the call backs  YOU WILL RECEIVE A CALL BACK THE SAME DAY AS LONG AS YOU CALL BEFORE 4:00 PM

## 2022-09-25 ENCOUNTER — Telehealth (HOSPITAL_COMMUNITY): Payer: Self-pay | Admitting: Licensed Clinical Social Worker

## 2022-09-25 LAB — PARATHYROID HORMONE, INTACT (NO CA): PTH: 54 pg/mL (ref 15–65)

## 2022-09-25 NOTE — Telephone Encounter (Signed)
Patient being followed by [facility] paramedicine- updates from yesterdays appt sent to pt paramedic Celine Ahr, LCSW Clinical Social Worker [facility] Desk#: [phone] Cell#: [phone]

## 2022-09-29 DIAGNOSIS — I129 Hypertensive chronic kidney disease with stage 1 through stage 4 chronic kidney disease, or unspecified chronic kidney disease: Secondary | ICD-10-CM | POA: Diagnosis not present

## 2022-09-29 DIAGNOSIS — I5022 Chronic systolic (congestive) heart failure: Secondary | ICD-10-CM | POA: Diagnosis not present

## 2022-09-29 DIAGNOSIS — N2581 Secondary hyperparathyroidism of renal origin: Secondary | ICD-10-CM | POA: Diagnosis not present

## 2022-09-29 DIAGNOSIS — R809 Proteinuria, unspecified: Secondary | ICD-10-CM | POA: Diagnosis not present

## 2022-10-08 NOTE — Addendum Note (Signed)
Encounter addended by: Andee Lineman A on: 10/08/2022 9:24 AM  Actions taken: Imaging Exam ended

## 2022-10-22 ENCOUNTER — Inpatient Hospital Stay (HOSPITAL_COMMUNITY): Admission: RE | Admit: 2022-10-22 | Payer: 59 | Source: Ambulatory Visit

## 2022-11-14 ENCOUNTER — Telehealth (HOSPITAL_COMMUNITY): Payer: Self-pay | Admitting: Cardiology

## 2022-11-14 NOTE — Telephone Encounter (Signed)
Macy with paramedicine called to report pts pcp started him on tamlsartan   Chart updated most recent ov sent to pcp

## 2022-12-04 DIAGNOSIS — M109 Gout, unspecified: Secondary | ICD-10-CM | POA: Diagnosis not present

## 2022-12-04 DIAGNOSIS — I429 Cardiomyopathy, unspecified: Secondary | ICD-10-CM | POA: Diagnosis not present

## 2022-12-04 DIAGNOSIS — D696 Thrombocytopenia, unspecified: Secondary | ICD-10-CM | POA: Diagnosis not present

## 2022-12-04 DIAGNOSIS — E782 Mixed hyperlipidemia: Secondary | ICD-10-CM | POA: Diagnosis not present

## 2022-12-04 DIAGNOSIS — R7303 Prediabetes: Secondary | ICD-10-CM | POA: Diagnosis not present

## 2022-12-04 LAB — TSH: TSH: 0.05 — AB (ref 0.41–5.90)

## 2022-12-10 DIAGNOSIS — R809 Proteinuria, unspecified: Secondary | ICD-10-CM | POA: Diagnosis not present

## 2022-12-10 DIAGNOSIS — E876 Hypokalemia: Secondary | ICD-10-CM | POA: Diagnosis not present

## 2022-12-10 DIAGNOSIS — I509 Heart failure, unspecified: Secondary | ICD-10-CM | POA: Diagnosis not present

## 2022-12-10 DIAGNOSIS — E039 Hypothyroidism, unspecified: Secondary | ICD-10-CM | POA: Diagnosis not present

## 2022-12-10 DIAGNOSIS — R7303 Prediabetes: Secondary | ICD-10-CM | POA: Diagnosis not present

## 2022-12-10 DIAGNOSIS — Z23 Encounter for immunization: Secondary | ICD-10-CM | POA: Diagnosis not present

## 2022-12-10 DIAGNOSIS — E782 Mixed hyperlipidemia: Secondary | ICD-10-CM | POA: Diagnosis not present

## 2022-12-10 DIAGNOSIS — D696 Thrombocytopenia, unspecified: Secondary | ICD-10-CM | POA: Diagnosis not present

## 2022-12-10 DIAGNOSIS — N1832 Chronic kidney disease, stage 3b: Secondary | ICD-10-CM | POA: Diagnosis not present

## 2022-12-10 DIAGNOSIS — I482 Chronic atrial fibrillation, unspecified: Secondary | ICD-10-CM | POA: Diagnosis not present

## 2022-12-10 DIAGNOSIS — D6869 Other thrombophilia: Secondary | ICD-10-CM | POA: Diagnosis not present

## 2022-12-10 DIAGNOSIS — I130 Hypertensive heart and chronic kidney disease with heart failure and stage 1 through stage 4 chronic kidney disease, or unspecified chronic kidney disease: Secondary | ICD-10-CM | POA: Diagnosis not present

## 2022-12-17 ENCOUNTER — Other Ambulatory Visit (HOSPITAL_COMMUNITY): Payer: Self-pay | Admitting: Cardiology

## 2022-12-18 ENCOUNTER — Other Ambulatory Visit (HOSPITAL_COMMUNITY): Payer: Self-pay | Admitting: Cardiology

## 2022-12-31 DIAGNOSIS — R809 Proteinuria, unspecified: Secondary | ICD-10-CM | POA: Diagnosis not present

## 2022-12-31 DIAGNOSIS — D631 Anemia in chronic kidney disease: Secondary | ICD-10-CM | POA: Diagnosis not present

## 2022-12-31 DIAGNOSIS — N183 Chronic kidney disease, stage 3 unspecified: Secondary | ICD-10-CM | POA: Diagnosis not present

## 2023-01-05 DIAGNOSIS — I129 Hypertensive chronic kidney disease with stage 1 through stage 4 chronic kidney disease, or unspecified chronic kidney disease: Secondary | ICD-10-CM | POA: Diagnosis not present

## 2023-01-05 DIAGNOSIS — R809 Proteinuria, unspecified: Secondary | ICD-10-CM | POA: Diagnosis not present

## 2023-01-05 DIAGNOSIS — N2581 Secondary hyperparathyroidism of renal origin: Secondary | ICD-10-CM | POA: Diagnosis not present

## 2023-01-05 DIAGNOSIS — I5022 Chronic systolic (congestive) heart failure: Secondary | ICD-10-CM | POA: Diagnosis not present

## 2023-01-13 ENCOUNTER — Encounter (HOSPITAL_COMMUNITY): Payer: 59 | Admitting: Cardiology

## 2023-01-19 ENCOUNTER — Other Ambulatory Visit (HOSPITAL_COMMUNITY): Payer: Self-pay

## 2023-01-19 DIAGNOSIS — I5022 Chronic systolic (congestive) heart failure: Secondary | ICD-10-CM

## 2023-01-19 MED ORDER — FUROSEMIDE 20 MG PO TABS
40.0000 mg | ORAL_TABLET | Freq: Every day | ORAL | 3 refills | Status: DC
Start: 2023-01-19 — End: 2023-04-10

## 2023-01-26 ENCOUNTER — Other Ambulatory Visit (HOSPITAL_COMMUNITY): Payer: Self-pay

## 2023-02-24 ENCOUNTER — Other Ambulatory Visit (HOSPITAL_COMMUNITY): Payer: Self-pay

## 2023-02-24 ENCOUNTER — Ambulatory Visit (HOSPITAL_COMMUNITY)
Admission: RE | Admit: 2023-02-24 | Discharge: 2023-02-24 | Disposition: A | Discharge status: Home / Self Care | Payer: 59 | Source: Ambulatory Visit | Attending: Cardiology | Admitting: Cardiology

## 2023-02-24 ENCOUNTER — Encounter (HOSPITAL_COMMUNITY): Payer: Self-pay | Admitting: Cardiology

## 2023-02-24 VITALS — BP 120/70 | HR 85 | Wt 184.8 lb

## 2023-02-24 DIAGNOSIS — I480 Paroxysmal atrial fibrillation: Secondary | ICD-10-CM | POA: Diagnosis not present

## 2023-02-24 DIAGNOSIS — I5022 Chronic systolic (congestive) heart failure: Secondary | ICD-10-CM | POA: Insufficient documentation

## 2023-02-24 DIAGNOSIS — E1122 Type 2 diabetes mellitus with diabetic chronic kidney disease: Secondary | ICD-10-CM | POA: Diagnosis not present

## 2023-02-24 DIAGNOSIS — E039 Hypothyroidism, unspecified: Secondary | ICD-10-CM | POA: Diagnosis not present

## 2023-02-24 DIAGNOSIS — Z79899 Other long term (current) drug therapy: Secondary | ICD-10-CM | POA: Diagnosis not present

## 2023-02-24 DIAGNOSIS — I4819 Other persistent atrial fibrillation: Secondary | ICD-10-CM | POA: Diagnosis not present

## 2023-02-24 DIAGNOSIS — I130 Hypertensive heart and chronic kidney disease with heart failure and stage 1 through stage 4 chronic kidney disease, or unspecified chronic kidney disease: Secondary | ICD-10-CM | POA: Diagnosis not present

## 2023-02-24 DIAGNOSIS — Z79890 Hormone replacement therapy (postmenopausal): Secondary | ICD-10-CM | POA: Insufficient documentation

## 2023-02-24 DIAGNOSIS — Z7901 Long term (current) use of anticoagulants: Secondary | ICD-10-CM | POA: Diagnosis not present

## 2023-02-24 DIAGNOSIS — Z7984 Long term (current) use of oral hypoglycemic drugs: Secondary | ICD-10-CM | POA: Insufficient documentation

## 2023-02-24 DIAGNOSIS — I340 Nonrheumatic mitral (valve) insufficiency: Secondary | ICD-10-CM | POA: Insufficient documentation

## 2023-02-24 DIAGNOSIS — N1832 Chronic kidney disease, stage 3b: Secondary | ICD-10-CM | POA: Insufficient documentation

## 2023-02-24 LAB — COMPREHENSIVE METABOLIC PANEL
ALT: 10 U/L (ref 0–44)
AST: 23 U/L (ref 15–41)
Albumin: 3.9 g/dL (ref 3.5–5.0)
Alkaline Phosphatase: 104 U/L (ref 38–126)
Anion gap: 9 (ref 5–15)
BUN: 18 mg/dL (ref 8–23)
CO2: 29 mmol/L (ref 22–32)
Calcium: 9.5 mg/dL (ref 8.9–10.3)
Chloride: 106 mmol/L (ref 98–111)
Creatinine, Ser: 1.87 mg/dL — ABNORMAL HIGH (ref 0.61–1.24)
GFR, Estimated: 37 mL/min — ABNORMAL LOW (ref >60)
Glucose, Bld: 103 mg/dL — ABNORMAL HIGH (ref 70–99)
Potassium: 3.6 mmol/L (ref 3.5–5.1)
Sodium: 144 mmol/L (ref 135–145)
Total Bilirubin: 2.1 mg/dL — ABNORMAL HIGH (ref 0.0–1.2)
Total Protein: 7.2 g/dL (ref 6.5–8.1)

## 2023-02-24 LAB — TSH: TSH: 0.011 u[IU]/mL — ABNORMAL LOW (ref 0.350–4.500)

## 2023-02-24 LAB — BRAIN NATRIURETIC PEPTIDE: B Natriuretic Peptide: 132.4 pg/mL — ABNORMAL HIGH (ref 0.0–100.0)

## 2023-02-24 MED ORDER — METOPROLOL SUCCINATE ER 25 MG PO TB24: 25.0000 mg | ORAL_TABLET | Freq: Every day | ORAL | 3 refills | Status: DC

## 2023-02-24 MED ORDER — AMIODARONE HCL 200 MG PO TABS: 200.0000 mg | ORAL_TABLET | Freq: Every day | ORAL | 3 refills | Status: DC

## 2023-02-24 MED ORDER — VALSARTAN 40 MG PO TABS: 40.0000 mg | ORAL_TABLET | Freq: Every day | ORAL | 3 refills | Status: DC

## 2023-02-24 NOTE — Patient Instructions (Addendum)
INCREASE Amiodarone to 200 mg daily.  STOP Telmisartan  START Valsartan 40 mg daily.  RESTART Toprol Xl 25 mg daily.  Labs done today, your results will be available in MyChart, we will contact you for abnormal readings.  Repeat blood work in 2 weeks at [facility] are scheduled for a Cardioversion on Monday, February 17 with Dr. Shirlee Latch.  Please arrive at the [facility] (Main Entrance A) at [facility]: [address] [place], [place] [address] at 8:30 AM (This time is 1 hour(s) before your procedure to ensure your preparation).   Free valet parking service is available. You will check in at ADMITTING.   *Please Note: You will receive a call the day before your procedure to confirm the appointment time. That time may have changed from the original time based on the schedule for that day.*    DIET:  Nothing to eat or drink after midnight except a sip of water with medications (see medication instructions below)  MEDICATION INSTRUCTIONS:  HOLD LASIX AND SPIRONOLACTONE THE MORNING OF YOUR PROCEDURE  HOLD: Dapagliflozin Marcelline Deist) for 3 days prior to the procedure. Last dose on Thursday, February 13.   Continue taking your anticoagulant (blood thinner): Apixaban (Eliquis).  You will need to continue this after your procedure until you are told by your provider that it is safe to stop.     FYI:  For your safety, and to allow [place] to monitor your vital signs accurately during the surgery/procedure we request: If you have artificial nails, gel coating, SNS etc, please have those removed prior to your surgery/procedure. Not having the nail coverings /polish removed may result in cancellation or delay of your surgery/procedure.  Your support person will be asked to wait in the waiting room during your procedure.  It is OK to have someone drop you off and come back when you are ready to be discharged.  You cannot drive after the procedure and will need someone to drive you  home.  Bring your insurance cards.  *Special Note: Every effort is made to have your procedure done on time. Occasionally there are emergencies that occur at the hospital that may cause delays. Please be patient if a delay does occur.   Your physician recommends that you schedule a follow-up appointment in: 6 weeks.  If you have any questions or concerns before your next appointment please send [place] a message through [place] or call our office at [phone].    TO LEAVE A MESSAGE FOR THE NURSE SELECT OPTION 2, PLEASE LEAVE A MESSAGE INCLUDING: YOUR NAME DATE OF BIRTH CALL BACK NUMBER REASON FOR CALL**this is important as we prioritize the call backs  YOU WILL RECEIVE A CALL BACK THE SAME DAY AS LONG AS YOU CALL BEFORE 4:00 PM  At the [facility], you and your health needs are our priority. As part of our continuing mission to provide you with exceptional heart care, we have created designated Provider Care Teams. These Care Teams include your primary Cardiologist (physician) and Advanced Practice Providers (APPs- Physician Assistants and Nurse Practitioners) who all work together to provide you with the care you need, when you need it.   You may see any of the following providers on your designated Care Team at your next follow up: Dr Arvilla Meres Dr Marca Ancona Dr. Dorthula Nettles Dr. Clearnce Hasten Amy Filbert Schilder, NP Robbie Lis, PA [facility] [place], [place] Brynda Peon, NP [place] Lee, NP Karle Plumber, PharmD  Please be sure to bring in all your medications bottles to every appointment.    Thank you for choosing [facility]

## 2023-02-25 ENCOUNTER — Telehealth (HOSPITAL_COMMUNITY): Payer: Self-pay

## 2023-02-25 DIAGNOSIS — E039 Hypothyroidism, unspecified: Secondary | ICD-10-CM

## 2023-02-25 NOTE — Addendum Note (Signed)
Addended by: Bea Laura B on: 02/25/2023 11:31 AM   Modules accepted: Orders

## 2023-02-25 NOTE — Telephone Encounter (Signed)
Phillip Wilson pharmacy aware to NOT place amiodarone and levothyroxine in pill packs. -will have them removed as they are currently filling weekly packs

## 2023-02-25 NOTE — Progress Notes (Signed)
PCP: Dr. Jeanice Lim HF Cardiology: Dr. Shirlee Latch  78 y.o. with history of cardiomyopathy (never had full workup of etiology), HTN, CKD, paroxysmal atrial fibrillation and NSVT was referred by Randall An, PA, for evaluation of CHF.  He has had a cardiomyopathy since at least 2012, echo at that time showed EF 30-35%.  EF has been low since that time, most recent echo was in 9/21 with LV EF <15%, moderate-severe likely functional MR, and moderate RV dysfunction.  He has a history of NSVT and is on amiodarone.  He has never had a full workup for etiology of cardiomyopathy (no cath, cMRI, etc).  The prior notes report significant noncompliance with cardiology followup though he seems to be taking his medications appropriately with the help of his sister.  He was never implanted with an ICD due to noncompliance per the notes. He has not been able to take Entresto due to hives.  He was hospitalized in 9/21 at [facility] with CHF and atrial fibrillation/RVR.  He had cardiogenic shock and was on norepinephrine transiently.  He was cardioverted during that hospitalization.   In 2022, we had planned to investigate his cardiomyopathy with [facility] and cMRI. However, he called and cancelled all of his testing and follow ups.   Admitted 3/24 with CHF and rate controlled AF. Placed on amiodarone. Echo showed EF 25%, severe LV dysfunction, mild LVH, RV severely reduced and enlarged, moderate TR and moderate to severe MR. He was diuresed with IV lasix and GDMT titrated, but limited by AKI. DCCV not pursued due to questionable compliance with Eliquis. He was discharged home, weight 211 lbs.  Seen post hospital follow up 4/24, he was markedly overloaded and in AF with RVR. He was admitted from clinic, started on IV amiodarone and diuresed with IV lasix. Underwent successful DCCV to NSR. Drips weaned and GDMT titrated. He as discharged home w/ paramedicine, weight 190 lbs.   Echo 7/24 showed EF 30%, ?LV noncompaction, mild LV dilation,  mild LVH, mildly decreased RV systolic function, moderate central MR.   Follow up 7/24, remained in AF. Arranged for repeat DCCV on 09/12/22--> NSR in 40s with periodic Wenckebach and junctional beats. Zio AT placed. Zio monitor (9/24) showed no AF, average HR 59.   Today he returns for HF follow up with his sister. He is back in atrial fibrillation though he does not feel it.  No dyspnea with usual activities.  No chest pain. No lightheadedness.  No orthopnea/PND.  He is not taking Toprol XL, not sure why.  He is taking all his other medications in bubble packs.  His PCP started telmisartan.   ECG (personally reviewed): Atrial fibrillation rate 93 with LAFB  Labs (6/21): creatinine 1.48 Labs (9/21): creatinine 3.1 Labs (3/22): K 3, creatinine 1.14 Labs (4/24): K 3.8, creatinine 2.40 Labs (6/24): TSH normal Labs (7/24): K 4.5, creatinine 1.9, LDL 57, LFTs normal, hgb 15.3, plts 108 Labs (11/24): K 3.9, creatinine 1.67, LDL 55  PMH: 1. Cardiomyopathy: Presumed nonischemic  - Echo (2012) with EF 30-35% - Echo (6/17) with EF 15-20% - Echo (9/21) with EF < 15%, moderate-severe MR, moderate RV dilation with moderately decreased RV systolic function, PASP 56 mmHg.  - Echo (3/24): EF 25%, severe LV dysfunction, mild LVH, RV severely reduced and enlarged, moderate TR and moderate to severe MR.  - Echo (7/24): EF 30%, ?LV noncompaction, mild LV dilation, mild LVH, mildly decreased RV systolic function, moderate central MR.  - Hives with Entresto 2. H/o NSVT: On  amiodarone, no ICD placed per notes due to poor compliance.  3. Mitral regurgitation: Moderate-severe on 9/21 and 3/24 echo, suspect functional. 4. HTN 5. Hyperlipidemia 6. Type 2 diabetes 7. CKD stage 3.  8. Gout 9. Atrial fibrillation: Paroxysmal.  DCCV in 12/19 and again in 9/21 during hospitalization at [facility].  - DCCV 4/24 - DCCV 8/24 10. Chronic thrombocytopenia.   Social History   Socioeconomic History   Marital status: Single     Spouse name: Not on file   Number of children: 2   Years of education: Not on file   Highest education level: Not on file  Occupational History   Occupation: Maintenance department    Comment: [employer]  Tobacco Use   Smoking status: Never   Smokeless tobacco: Never  Vaping Use   Vaping status: Never Used  Substance and Sexual Activity   Alcohol use: Yes    Alcohol/week: 14.0 standard drinks of alcohol    Types: 4 Cans of beer, 10 Standard drinks or equivalent per week    Comment: stopped 2019   Drug use: No   Sexual activity: Yes  Other Topics Concern   Not on file  Social History Narrative   Not on file   Social Drivers of Health   Financial Resource Strain: Low Risk  (05/07/2022)   Overall Financial Resource Strain (CARDIA)    Difficulty of Paying Living Expenses: Not hard at all  Food Insecurity: Food Insecurity Present (05/08/2022)   Hunger Vital Sign    Worried About Running Out of Food in the Last Year: Sometimes true    Ran Out of Food in the Last Year: Sometimes true  Transportation Needs: No Transportation Needs (06/16/2022)   PRAPARE - Administrator, Civil Service (Medical): No    Lack of Transportation (Non-Medical): No  Physical Activity: Inactive (05/07/2022)   Exercise Vital Sign    Days of Exercise per Week: 0 days    Minutes of Exercise per Session: 0 min  Stress: No Stress Concern Present (05/07/2022)   [employer] of Occupational Health - Occupational Stress Questionnaire    Feeling of Stress : Not at all  Social Connections: Socially Isolated (05/07/2022)   Social Connection and Isolation Panel [NHANES]    Frequency of Communication with Friends and Family: More than three times a week    Frequency of Social Gatherings with Friends and Family: More than three times a week    Attends Religious Services: Never    Database administrator or Organizations: No    Attends Banker Meetings: Never    Marital Status: Never married   Intimate Partner Violence: Not At Risk (05/08/2022)   Humiliation, Afraid, Rape, and Kick questionnaire    Fear of Current or Ex-Partner: No    Emotionally Abused: No    Physically Abused: No    Sexually Abused: No   Family History  Problem Relation Age of Onset   Dementia Mother    COPD Father    Diabetes Father    Coronary artery disease Father    ROS: All systems reviewed and negative except as per HPI.   Current Outpatient Medications  Medication Sig Dispense Refill   acetaminophen (TYLENOL) 325 MG tablet Take 325-650 mg by mouth every 6 (six) hours as needed for headache, mild pain or moderate pain.     allopurinol (ZYLOPRIM) 100 MG tablet TAKE 1/2 (ONE-HALF) TABLET BY MOUTH ONCE DAILY FOR GOUT 30 tablet 0   apixaban (ELIQUIS) 5  MG TABS tablet Take 1 tablet (5 mg total) by mouth 2 (two) times daily. 180 tablet 1   atorvastatin (LIPITOR) 20 MG tablet Take 20 mg by mouth daily.     calcitRIOL (ROCALTROL) 0.25 MCG capsule .25 mcg 3 times a week. Monday , Wednesday, and Friday 45 capsule 1   dapagliflozin propanediol (FARXIGA) 10 MG TABS tablet Take 1 tablet (10 mg total) by mouth daily. 30 tablet 11   furosemide (LASIX) 20 MG tablet Take 2 tablets (40 mg total) by mouth daily. 90 tablet 3   hydrOXYzine (ATARAX) 25 MG tablet Take 25 mg by mouth at bedtime as needed.     isosorbide-hydrALAZINE (BIDIL) 20-37.5 MG tablet TAKE ONE TABLET BY MOUTH 3 TIMES A DAY 90 tablet 3   levothyroxine (SYNTHROID) 75 MCG tablet Take 1 tablet (75 mcg total) by mouth daily at 6 (six) AM. 30 tablet 0   metoprolol succinate (TOPROL XL) 25 MG 24 hr tablet Take 1 tablet (25 mg total) by mouth daily. 90 tablet 3   spironolactone (ALDACTONE) 25 MG tablet Take 0.5 tablets (12.5 mg total) by mouth daily. 45 tablet 3   tamsulosin (FLOMAX) 0.4 MG CAPS capsule Take 1 capsule (0.4 mg total) by mouth daily. 90 capsule 1   valsartan (DIOVAN) 40 MG tablet Take 1 tablet (40 mg total) by mouth daily. 90 tablet 3    amiodarone (PACERONE) 200 MG tablet Take 1 tablet (200 mg total) by mouth daily. 90 tablet 3   No current facility-administered medications for this encounter.   Wt Readings from Last 3 Encounters:  02/24/23 83.8 kg (184 lb 12.8 oz)  09/24/22 88.4 kg (194 lb 12.8 oz)  09/12/22 78.9 kg (174 lb)   BP 120/70   Pulse 85   Wt 83.8 kg (184 lb 12.8 oz)   SpO2 95%   BMI 28.10 kg/m  General: NAD Neck: No JVD, no thyromegaly or thyroid nodule.  Lungs: Clear to auscultation bilaterally with normal respiratory effort. CV: Nondisplaced PMI.  Heart irregular S1/S2, no S3/S4, no murmur.  No peripheral edema.  No carotid bruit.  Normal pedal pulses.  Abdomen: Soft, nontender, no hepatosplenomegaly, no distention.  Skin: Intact without lesions or rashes.  Neurologic: Alert and oriented x 3.  Psych: Normal affect. Extremities: No clubbing or cyanosis.  HEENT: Normal.   Assessment/Plan: 1. Chronic Systolic Heart Failure: Long-standing cardiomyopathy of uncertain etiology (never had full workup of etiology). Echo 9/21 with LV EF <15%, moderate-severe likely functional MR, and moderate RV dysfunction. This echo was apparently done in the setting of atrial fibrillation with RVR (at [facility]). As his cardiomyopathy pre-dates identification of atrial fibrillation, unlikely to have a tachy-mediated cardiomyopathy.  Echo 3/24 EF 25%, severe LV dysfunction, mild LVH, RV severely reduced and enlarged, moderate TR and moderate to severe MR. Echo 7/24 showed EF 30%, ?LV noncompaction, mild LV dilation, mild LVH, mildly decreased RV systolic function, moderate central MR.  We have been unable to investigate etiology for his cardiomyopathy. He has not had R/LHC (refused to have this done), unable to obtain cMRI as he has "buckshot pellets" throughout chest. Currently NYHA class II, not volume overloaded.  Back in atrial fibrillation which likely will worsen HF over time.  - Continue spironolactone 12.5 daily.  - Continue  Bidil 1 tab tid. - Continue Lasix 40 mg daily. - Continue Farxiga 10 mg daily - Restart Toprol XL 25 mg daily.  - Intolerant to Entresto in the past (hives).  Started on telmisartan by  PCP, not a heart failure ARB.  Would stop this and start valsartan 40 mg daily. BMET/BNP today, BMET in 10 days.  - He is clear that he does not want an ICD.   2. Atrial Fibrillation: Persistent. S/p DCCV to NSR (4/24). He was back in AF at follow up 06/29/22, and amiodarone was started. Underwent repeat DCCV 8/24. He is back in atrial fibrillation today.  - Increase amiodarone to 200 mg daily, check LFTs and TSH today.  - Continue Eliquis 5 mg bid.  - He is not interested in atrial fibrillation ablation.  - He refused sleep study.  - If he remains in atrial fibrillation, I will arrange for DCCV.  We discussed risks/benefits and he agrees to procedure.   3.  CKD IIIb: He is followed by Dr. Wolfgang Phoenix. - Continue SGLT2i.  - BMET today.  4. Mitral Regurgitation: Moderate on most recent echo. Suspect functional. 5. Hypothyroidism: Levoxyl followed by PCP.  6. Type 2 DM: Continue SGLT2i 7. TTP: Followed by Dr. Ellin Saba    Followup in 6 wks with APP.   Marca Ancona  02/25/23

## 2023-02-25 NOTE — Telephone Encounter (Signed)
Per Dr. Si Gaul addition to what I said in my note attached to the lab, he needs to stop Levothyroxine. Just saw he was on this.    Discussed with patient and made him aware of all instructions. Patient will go tomorrow to [facility] in [place] to have labs drawn. Orders have been placed.   Cardioversion cancelled.   Called and spoke with paramedicine- patient gets bubble packs- per Jeanie Cooks she will go out and adjust these to remove amiodarone and levothyroxine.   Also called [facility] to make them aware to remove this. They will call back to confirm.   Orders placed and medication list updated.

## 2023-02-25 NOTE — Telephone Encounter (Signed)
-----   Message from Marca Ancona sent at 02/24/2023  5:18 PM EST ----- TSH is now low.  I worry that he has hyperthyroidism from amiodarone.  Would hold off on DCCV for now. Needs to get free T4 and free T3 checked.  Stop amiodarone for now.  Will decide on methimazole dose once I have a free T4 and free T3 level.  Refer to endocrinology in [place], Dr. Fransico Him.  Keep followup in office.

## 2023-02-25 NOTE — Telephone Encounter (Signed)
Referral also placed to endocrinology.

## 2023-03-04 NOTE — Patient Instructions (Signed)
 Thyroid-Stimulating Hormone Test Why am I having this test? The thyroid is a gland in the lower front of the neck. It makes hormones that affect many body parts and systems, including the system that affects how quickly the body burns fuel for energy (metabolism). The pituitary gland is located just below the brain, behind the eyes and nasal passages. It helps maintain thyroid hormone levels and thyroid gland function. You may have a thyroid-stimulating hormone (TSH) test if you have possible symptoms of abnormal thyroid hormone levels. This test can help your health care provider: Diagnose a disorder of the thyroid gland or pituitary gland. Manage your condition and treatment if you have an underactive thyroid (hypothyroidism) or an overactive thyroid (hyperthyroidism). Newborn babies may have this test done to screen for hypothyroidism that is present at birth (congenital). What is being tested? This test measures the amount of TSH in your blood. TSH may also be called thyrotropin. When the thyroid does not make enough hormones, the pituitary gland releases TSH into the bloodstream to stimulate the thyroid gland to make more hormones. What kind of sample is taken?     A blood sample is required for this test. It is usually collected by inserting a needle into a blood vessel. For newborns, a small amount of blood may be collected from the umbilical cord, or by using a small needle to prick the baby's heel (heel stick). Tell a health care provider about: All medicines you are taking, including vitamins, herbs, eye drops, creams, and over-the-counter medicines. Any bleeding problems you have. Any surgeries you have had. Any medical conditions you have. Whether you are pregnant or may be pregnant. How are the results reported? Your test results will be reported as a value that indicates how much TSH is in your blood. Your health care provider will compare your results to normal ranges that were  established after testing a large group of people (reference ranges). Reference ranges may vary among labs and hospitals. For this test, common reference ranges are: Adult: 2-10 microunits/mL or 2-10 milliunits/L. Newborn: Heel stick: 3-18 microunits/mL or 3-18 milliunits/L. Umbilical cord: 3-12 microunits/mL or 3-12 milliunits/L. What do the results mean? Results that are within the reference range are considered normal. This means that you have a normal amount of TSH in your blood. Results that are higher than the reference range mean that your TSH levels are too high. This may mean: Your thyroid gland is not making enough thyroid hormones. Your thyroid medicine dosage is too low. You have a tumor on your pituitary gland. This is rare. Results that are lower than the reference range mean that your TSH levels are too low. This may be caused by hyperthyroidism or by a problem with the pituitary gland function. Talk with your health care provider about what your results mean. Questions to ask your health care provider Ask your health care provider, or the department that is doing the test: When will my results be ready? How will I get my results? What are my treatment options? What other tests do I need? What are my next steps? Summary You may have a thyroid-stimulating hormone (TSH) test if you have possible symptoms of abnormal thyroid hormone levels. The thyroid is a gland in the lower front of the neck. It makes hormones that affect many body parts and systems. The pituitary gland is located just below the brain, behind the eyes and nasal passages. It helps maintain thyroid hormone levels and thyroid gland function. This test  measures the amount of TSH in your blood. TSH is made by the pituitary gland. It may also be called thyrotropin. This information is not intended to replace advice given to you by your health care provider. Make sure you discuss any questions you have with your  health care provider. Document Revised: 01/15/2021 Document Reviewed: 01/15/2021 Elsevier Patient Education  2024 [employer].

## 2023-03-05 ENCOUNTER — Ambulatory Visit (INDEPENDENT_AMBULATORY_CARE_PROVIDER_SITE_OTHER): Payer: 59 | Admitting: Nurse Practitioner

## 2023-03-05 ENCOUNTER — Encounter: Payer: Self-pay | Admitting: Nurse Practitioner

## 2023-03-05 VITALS — BP 124/70 | HR 69 | Ht 68.0 in | Wt 189.2 lb

## 2023-03-05 DIAGNOSIS — E782 Mixed hyperlipidemia: Secondary | ICD-10-CM | POA: Diagnosis not present

## 2023-03-05 DIAGNOSIS — I10 Essential (primary) hypertension: Secondary | ICD-10-CM | POA: Diagnosis not present

## 2023-03-05 DIAGNOSIS — R7301 Impaired fasting glucose: Secondary | ICD-10-CM | POA: Diagnosis not present

## 2023-03-05 DIAGNOSIS — E1121 Type 2 diabetes mellitus with diabetic nephropathy: Secondary | ICD-10-CM | POA: Diagnosis not present

## 2023-03-05 DIAGNOSIS — R7989 Other specified abnormal findings of blood chemistry: Secondary | ICD-10-CM
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
## 2023-03-06 LAB — T3, FREE: T3, Free: 3.4 pg/mL (ref 2.0–4.4)

## 2023-03-06 LAB — T4, FREE: Free T4: 1.75 ng/dL (ref 0.82–1.77)

## 2023-03-06 LAB — THYROGLOBULIN ANTIBODY: Thyroglobulin Antibody: <1 [IU]/mL (ref 0.0–0.9)

## 2023-03-06 LAB — TSH: TSH: 0.033 u[IU]/mL — ABNORMAL LOW (ref 0.450–4.500)

## 2023-03-06 LAB — THYROID PEROXIDASE ANTIBODY: Thyroperoxidase Ab SerPl-aCnc: 17 [IU]/mL (ref 0–34)

## 2023-03-09 ENCOUNTER — Other Ambulatory Visit: Payer: Self-pay | Admitting: Nurse Practitioner

## 2023-03-09 DIAGNOSIS — R7989 Other specified abnormal findings of blood chemistry: Secondary | ICD-10-CM

## 2023-03-09 NOTE — Progress Notes (Signed)
 Please call patient and let him know his repeat thyroid  labs are reassuring, still slightly on the overactive side but improving overall.  Since stopping both the Amiodarone  and his Levothyroxine , his levels are starting to return to normal without requiring Methimazole to help.  I recommend repeating labs in 6 weeks with follow up in office to discuss findings.

## 2023-03-09 NOTE — Progress Notes (Signed)
 Appt made and mailed with lab order

## 2023-03-10 ENCOUNTER — Telehealth: Payer: Self-pay | Admitting: *Deleted

## 2023-03-10 NOTE — Telephone Encounter (Signed)
Patient was called and given his results. Patient advised that our office was going to call him back for appointment and that he was to have his lab work drawn in 6 weeks. Patient's cell number is [phone].

## 2023-03-10 NOTE — Telephone Encounter (Signed)
-----   Message from Dani Gobble sent at 03/09/2023  6:51 AM EST ----- Please call patient and let him know his repeat thyroid labs are reassuring, still slightly on the overactive side but improving overall.  Since stopping both the Amiodarone and his Levothyroxine, his levels are starting to return to normal without requiring Methimazole to help.  I recommend repeating labs in 6 weeks with follow up in office to discuss findings.

## 2023-03-10 NOTE — Progress Notes (Signed)
Patient was called and given his results. Patient advised that our office was going to call him back for appointment and that he was to have his lab work drawn in 6 weeks. Patient's cell number is [phone].

## 2023-03-16 ENCOUNTER — Ambulatory Visit (HOSPITAL_COMMUNITY): Admit: 2023-03-16 | Payer: 59 | Admitting: Cardiology

## 2023-03-16 ENCOUNTER — Encounter (HOSPITAL_COMMUNITY): Payer: Self-pay

## 2023-03-16 SURGERY — CARDIOVERSION (CATH LAB)
Anesthesia: Monitor Anesthesia Care

## 2023-03-17 ENCOUNTER — Telehealth: Payer: Self-pay

## 2023-03-17 NOTE — Telephone Encounter (Signed)
Ordering provider: [facility] Associated diagnoses: I50.22, I50.43, I50.9 WatchPAT PA obtained on 03/17/2023 by Brunetta Genera, LPN. Authorization: Yes; tracking ID [id number] Patient notified of PIN ([id number]) on 03/17/2023 via Notification Method: phone.

## 2023-03-18 NOTE — Telephone Encounter (Signed)
Per phone note July 2024, pt declined to complete sleep study, was supposed to return device, order cancelled

## 2023-04-06 ENCOUNTER — Telehealth (HOSPITAL_COMMUNITY): Payer: Self-pay

## 2023-04-06 NOTE — Telephone Encounter (Signed)
 Called and spoke to [facility] with [facility] and patient to confirm/remind patient of their appointment [facility].   Patient reminded to bring all medications and/or complete list.  Confirmed patient has transportation. Gave directions, instructed to utilize valet parking.  Confirmed appointment prior to ending call.

## 2023-04-07 ENCOUNTER — Ambulatory Visit (HOSPITAL_COMMUNITY)
Admission: RE | Admit: 2023-04-07 | Discharge: 2023-04-07 | Disposition: A | Discharge status: Home / Self Care | Payer: 59 | Source: Ambulatory Visit | Attending: Family Medicine | Admitting: Family Medicine

## 2023-04-07 ENCOUNTER — Encounter (HOSPITAL_COMMUNITY): Payer: Self-pay

## 2023-04-07 VITALS — BP 126/70 | HR 87 | Wt 194.0 lb

## 2023-04-07 DIAGNOSIS — E0590 Thyrotoxicosis, unspecified without thyrotoxic crisis or storm: Secondary | ICD-10-CM

## 2023-04-07 DIAGNOSIS — I480 Paroxysmal atrial fibrillation: Secondary | ICD-10-CM

## 2023-04-07 DIAGNOSIS — E1122 Type 2 diabetes mellitus with diabetic chronic kidney disease: Secondary | ICD-10-CM | POA: Insufficient documentation

## 2023-04-07 DIAGNOSIS — N1832 Chronic kidney disease, stage 3b: Secondary | ICD-10-CM | POA: Insufficient documentation

## 2023-04-07 DIAGNOSIS — Z7984 Long term (current) use of oral hypoglycemic drugs: Secondary | ICD-10-CM | POA: Diagnosis not present

## 2023-04-07 DIAGNOSIS — I340 Nonrheumatic mitral (valve) insufficiency: Secondary | ICD-10-CM | POA: Diagnosis not present

## 2023-04-07 DIAGNOSIS — Z7901 Long term (current) use of anticoagulants: Secondary | ICD-10-CM | POA: Insufficient documentation

## 2023-04-07 DIAGNOSIS — M3119 Other thrombotic microangiopathy: Secondary | ICD-10-CM | POA: Diagnosis not present

## 2023-04-07 DIAGNOSIS — I5022 Chronic systolic (congestive) heart failure: Secondary | ICD-10-CM | POA: Insufficient documentation

## 2023-04-07 DIAGNOSIS — I4819 Other persistent atrial fibrillation: Secondary | ICD-10-CM | POA: Insufficient documentation

## 2023-04-07 DIAGNOSIS — I130 Hypertensive heart and chronic kidney disease with heart failure and stage 1 through stage 4 chronic kidney disease, or unspecified chronic kidney disease: Secondary | ICD-10-CM | POA: Diagnosis not present

## 2023-04-07 DIAGNOSIS — E039 Hypothyroidism, unspecified: Secondary | ICD-10-CM | POA: Diagnosis not present

## 2023-04-07 DIAGNOSIS — Z79899 Other long term (current) drug therapy: Secondary | ICD-10-CM | POA: Diagnosis not present

## 2023-04-07 LAB — CBC
HCT: 48.5 % (ref 39.0–52.0)
Hemoglobin: 15.8 g/dL (ref 13.0–17.0)
MCH: 27.2 pg (ref 26.0–34.0)
MCHC: 32.6 g/dL (ref 30.0–36.0)
MCV: 83.6 fL (ref 80.0–100.0)
Platelets: 113 10*3/uL — ABNORMAL LOW (ref 150–400)
RBC: 5.8 MIL/uL (ref 4.22–5.81)
RDW: 15.4 % (ref 11.5–15.5)
WBC: 6.9 10*3/uL (ref 4.0–10.5)
nRBC: 0 % (ref 0.0–0.2)

## 2023-04-07 LAB — BASIC METABOLIC PANEL
Anion gap: 7 (ref 5–15)
BUN: 43 mg/dL — ABNORMAL HIGH (ref 8–23)
CO2: 28 mmol/L (ref 22–32)
Calcium: 8.9 mg/dL (ref 8.9–10.3)
Chloride: 102 mmol/L (ref 98–111)
Creatinine, Ser: 2.11 mg/dL — ABNORMAL HIGH (ref 0.61–1.24)
GFR, Estimated: 32 mL/min — ABNORMAL LOW (ref >60)
Glucose, Bld: 106 mg/dL — ABNORMAL HIGH (ref 70–99)
Potassium: 4.1 mmol/L (ref 3.5–5.1)
Sodium: 137 mmol/L (ref 135–145)

## 2023-04-07 LAB — BRAIN NATRIURETIC PEPTIDE: B Natriuretic Peptide: 85.8 pg/mL (ref 0.0–100.0)
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
## 2023-04-07 NOTE — Patient Instructions (Signed)
 Medication Changes:  No Changes In Medications at this time.   Lab Work:  Labs done today, your results will be available in MyChart, we will contact you for abnormal readings.  Referrals:  YOU HAVE BEEN REFERRED TO ATRIAL FIB CLINIC THEY WILL REACH OUT TO YOU OR CALL TO ARRANGE THIS. PLEASE CALL us WITH ANY CONCERNS   Follow-Up in: 3 MONTHS PLEASE CALL OUR OFFICE AROUND APRIL  TO GET SCHEDULED FOR YOUR APPOINTMENT. PHONE NUMBER IS [phone] OPTION 2   At the [facility], you and your health needs are our priority. We have a designated team specialized in the treatment of Heart Failure. This Care Team includes your primary Heart Failure Specialized Cardiologist (physician), Advanced Practice Providers (APPs- Physician Assistants and Nurse Practitioners), and Pharmacist who all work together to provide you with the care you need, when you need it.   You may see any of the following providers on your designated Care Team at your next follow up:  Dr. Arvilla Meres Dr. Marca Ancona Dr. Dorthula Nettles Dr. Theresia Bough Tonye Becket, NP Robbie Lis, [place] [facility] [place], [place] Brynda Peon, NP [place] Lee, NP Karle Plumber, PharmD   Please be sure to bring in all your medications bottles to every appointment.   Need to Contact us:  If you have any questions or concerns before your next appointment please send [place] a message through [place] or call our office at [phone].    TO LEAVE A MESSAGE FOR THE NURSE SELECT OPTION 2, PLEASE LEAVE A MESSAGE INCLUDING: YOUR NAME DATE OF BIRTH CALL BACK NUMBER REASON FOR CALL**this is important as we prioritize the call backs  YOU WILL RECEIVE A CALL BACK THE SAME DAY AS LONG AS YOU CALL BEFORE 4:00 PM

## 2023-04-10 ENCOUNTER — Telehealth (HOSPITAL_COMMUNITY): Payer: Self-pay

## 2023-04-10 DIAGNOSIS — I5022 Chronic systolic (congestive) heart failure: Secondary | ICD-10-CM

## 2023-04-10 MED ORDER — FUROSEMIDE 20 MG PO TABS
ORAL_TABLET | ORAL | 5 refills | Status: DC
Start: 2023-04-10 — End: 2023-10-13

## 2023-04-10 NOTE — Telephone Encounter (Signed)
 Patient's lasix medication change will be updated in pt's chart. In addition pt's labs has been ordered per request for [facility]. Patient's sister Daralene Milch is aware, agreeable, and verbalized understanding.

## 2023-04-10 NOTE — Telephone Encounter (Signed)
-----   Message from Jacklynn Ganong sent at 04/10/2023  9:24 AM EDT ----- Renal function up a bit.   Please decrease Lasix to 40 mg daily alternating with 20 mg every other day.  Repeat BMET in 10-14 days

## 2023-04-13 ENCOUNTER — Other Ambulatory Visit (HOSPITAL_COMMUNITY): Payer: Self-pay | Admitting: Cardiology

## 2023-04-20 DIAGNOSIS — E039 Hypothyroidism, unspecified: Secondary | ICD-10-CM | POA: Diagnosis not present

## 2023-04-21 LAB — T3, FREE: T3, Free: 2.1 pg/mL (ref 2.0–4.4)

## 2023-04-21 LAB — T4, FREE: Free T4: 1.05 ng/dL (ref 0.82–1.77)

## 2023-04-22 ENCOUNTER — Encounter (HOSPITAL_COMMUNITY): Payer: Self-pay | Admitting: Physician Assistant

## 2023-04-22 ENCOUNTER — Ambulatory Visit (HOSPITAL_COMMUNITY)
Admission: RE | Admit: 2023-04-22 | Discharge: 2023-04-22 | Disposition: A | Discharge status: Home / Self Care | Source: Ambulatory Visit | Attending: Physician Assistant | Admitting: Physician Assistant

## 2023-04-22 VITALS — BP 162/90 | HR 106 | Ht 68.0 in | Wt 191.6 lb

## 2023-04-22 DIAGNOSIS — I4819 Other persistent atrial fibrillation: Secondary | ICD-10-CM | POA: Insufficient documentation

## 2023-04-22 DIAGNOSIS — E1122 Type 2 diabetes mellitus with diabetic chronic kidney disease: Secondary | ICD-10-CM | POA: Insufficient documentation

## 2023-04-22 DIAGNOSIS — Z7901 Long term (current) use of anticoagulants: Secondary | ICD-10-CM | POA: Diagnosis not present

## 2023-04-22 DIAGNOSIS — Z79899 Other long term (current) drug therapy: Secondary | ICD-10-CM | POA: Diagnosis not present

## 2023-04-22 DIAGNOSIS — I130 Hypertensive heart and chronic kidney disease with heart failure and stage 1 through stage 4 chronic kidney disease, or unspecified chronic kidney disease: Secondary | ICD-10-CM | POA: Insufficient documentation

## 2023-04-22 DIAGNOSIS — D6869 Other thrombophilia: Secondary | ICD-10-CM | POA: Diagnosis not present

## 2023-04-22 DIAGNOSIS — N183 Chronic kidney disease, stage 3 unspecified: Secondary | ICD-10-CM | POA: Diagnosis not present

## 2023-04-22 DIAGNOSIS — I5022 Chronic systolic (congestive) heart failure: Secondary | ICD-10-CM | POA: Insufficient documentation

## 2023-04-22 DIAGNOSIS — Z7984 Long term (current) use of oral hypoglycemic drugs: Secondary | ICD-10-CM | POA: Insufficient documentation

## 2023-04-22 DIAGNOSIS — E0590 Thyrotoxicosis, unspecified without thyrotoxic crisis or storm: Secondary | ICD-10-CM | POA: Diagnosis not present
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
## 2023-04-27 NOTE — Patient Outreach (Signed)
 Care Coordination   Follow Up Visit Note   04/27/2023 late entry note for 07/17/22 Name: Phillip Wilson MRN: [id number] DOB: [birth year]  Phillip Wilson is a 78 y.o. year old male who sees [name], [name], MD for primary care. I spoke with  Phillip Wilson by phone today.  What matters to the patients health and wellness today?  Follow up case from Randol Kern NP Patient reports he is doing well  He denies any worsening medical issues  He agrees to case closure with availability to outreach to RN CM as needed      Goals Addressed             This Visit's Progress    COMPLETED: Follow HF Action Plan to avoid complications and hospitalizations.   On track    As of 07/17/22 he has not had hospital re admission in 30+ days   NO SOB NO EDEMA.  Interventions Today    Interventions Today    Flowsheet Row Most Recent Value  Chronic Disease   Chronic disease during today's visit Congestive Heart Failure (CHF), Diabetes  General Interventions   General Interventions Discussed/Reviewed General Interventions Reviewed, Doctor Visits  Doctor Visits Discussed/Reviewed Doctor Visits Reviewed, PCP, Specialist  PCP/Specialist Visits Compliance with follow-up visit  Exercise Interventions   Exercise Discussed/Reviewed Exercise Reviewed, Physical Activity  Physical Activity Discussed/Reviewed Physical Activity Reviewed  Education Interventions   Education Provided Provided Education  Provided Verbal Education On Medication, Sick Day Rules  Mental Health Interventions   Mental Health Discussed/Reviewed Mental Health Reviewed, Coping Strategies  Nutrition Interventions   Nutrition Discussed/Reviewed Nutrition Reviewed, Decreasing salt  Pharmacy Interventions   Pharmacy Dicussed/Reviewed Pharmacy Topics Reviewed, Affording Medications  Safety Interventions   Safety Discussed/Reviewed Safety Reviewed, Home Safety  Home Safety Assistive Devices              SDOH assessments and  interventions completed:  No     Care Coordination Interventions:  Yes, provided   Follow up plan: No further intervention required.   Encounter Outcome:  Patient Visit Completed   Cala Bradford L. Noelle Penner, RN, BSN, CCM [place]  [facility] RN Care Manager Direct Dial: [phone]  Fax: [phone]

## 2023-04-27 NOTE — Patient Instructions (Signed)
 Visit Information  Thank you for taking time to visit with me today. Please don't hesitate to contact me if I can be of assistance to you.   Following are the goals we discussed today:   Goals Addressed             This Visit's Progress    COMPLETED: Follow HF Action Plan to avoid complications and hospitalizations.   On track    As of 07/17/22 he has not had hospital re admission in 30+ days   NO SOB NO EDEMA.  Interventions Today    Interventions Today    Flowsheet Row Most Recent Value  Chronic Disease   Chronic disease during today's visit Congestive Heart Failure (CHF), Diabetes  General Interventions   General Interventions Discussed/Reviewed General Interventions Reviewed, Doctor Visits  Doctor Visits Discussed/Reviewed Doctor Visits Reviewed, PCP, Specialist  PCP/Specialist Visits Compliance with follow-up visit  Exercise Interventions   Exercise Discussed/Reviewed Exercise Reviewed, Physical Activity  Physical Activity Discussed/Reviewed Physical Activity Reviewed  Education Interventions   Education Provided Provided Education  Provided Verbal Education On Medication, Sick Day Rules  Mental Health Interventions   Mental Health Discussed/Reviewed Mental Health Reviewed, Coping Strategies  Nutrition Interventions   Nutrition Discussed/Reviewed Nutrition Reviewed, Decreasing salt  Pharmacy Interventions   Pharmacy Dicussed/Reviewed Pharmacy Topics Reviewed, Affording Medications  Safety Interventions   Safety Discussed/Reviewed Safety Reviewed, Home Safety  Home Safety Assistive Devices              Our next appointment is  n/a  on n/a at n/a  Please call the care guide team at [phone] if you need to cancel or reschedule your appointment.   If you are experiencing a Mental Health or Behavioral Health Crisis or need someone to talk to, please call the Suicide and Crisis Lifeline: 988 call the [place] National Suicide Prevention Lifeline: 828-229-5993 or  TTY: (305) 362-8241 TTY 7171947013) to talk to a trained counselor call 1-800-273-TALK (toll free, 24 hour hotline) call the [facility]: (757)607-8878 call 911   Patient verbalizes understanding of instructions and care plan provided today and agrees to view in MyChart. Active MyChart status and patient understanding of how to access instructions and care plan via MyChart confirmed with patient.     The patient has been provided with contact information for the care management team and has been advised to call with any health related questions or concerns.   Brennley Curtice L. Noelle Penner, RN, BSN, CCM [facility] RN Care Manager [phone]

## 2023-05-06 ENCOUNTER — Ambulatory Visit: Payer: 59 | Admitting: Nurse Practitioner

## 2023-05-06 DIAGNOSIS — D649 Anemia, unspecified: Secondary | ICD-10-CM | POA: Diagnosis not present

## 2023-05-06 DIAGNOSIS — D631 Anemia in chronic kidney disease: Secondary | ICD-10-CM | POA: Diagnosis not present

## 2023-05-06 DIAGNOSIS — R809 Proteinuria, unspecified: Secondary | ICD-10-CM | POA: Diagnosis not present

## 2023-05-06 DIAGNOSIS — E211 Secondary hyperparathyroidism, not elsewhere classified: Secondary | ICD-10-CM | POA: Diagnosis not present

## 2023-05-06 DIAGNOSIS — N189 Chronic kidney disease, unspecified: Secondary | ICD-10-CM | POA: Diagnosis not present

## 2023-05-11 DIAGNOSIS — R809 Proteinuria, unspecified: Secondary | ICD-10-CM | POA: Diagnosis not present

## 2023-05-11 DIAGNOSIS — I129 Hypertensive chronic kidney disease with stage 1 through stage 4 chronic kidney disease, or unspecified chronic kidney disease: Secondary | ICD-10-CM | POA: Diagnosis not present

## 2023-05-11 DIAGNOSIS — N1832 Chronic kidney disease, stage 3b: Secondary | ICD-10-CM | POA: Diagnosis not present

## 2023-05-11 DIAGNOSIS — E1122 Type 2 diabetes mellitus with diabetic chronic kidney disease: Secondary | ICD-10-CM | POA: Diagnosis not present

## 2023-05-19 ENCOUNTER — Ambulatory Visit: Admitting: Cardiology

## 2023-05-20 ENCOUNTER — Ambulatory Visit: Admitting: Nurse Practitioner

## 2023-05-20 DIAGNOSIS — R7989 Other specified abnormal findings of blood chemistry: Secondary | ICD-10-CM

## 2023-06-01 ENCOUNTER — Encounter (HOSPITAL_COMMUNITY): Payer: Self-pay

## 2023-06-01 DIAGNOSIS — K439 Ventral hernia without obstruction or gangrene: Secondary | ICD-10-CM | POA: Diagnosis not present

## 2023-06-01 DIAGNOSIS — M109 Gout, unspecified: Secondary | ICD-10-CM | POA: Diagnosis not present

## 2023-06-01 DIAGNOSIS — N189 Chronic kidney disease, unspecified: Secondary | ICD-10-CM | POA: Diagnosis not present

## 2023-06-01 DIAGNOSIS — K56609 Unspecified intestinal obstruction, unspecified as to partial versus complete obstruction: Secondary | ICD-10-CM | POA: Diagnosis not present

## 2023-06-01 DIAGNOSIS — K436 Other and unspecified ventral hernia with obstruction, without gangrene: Secondary | ICD-10-CM | POA: Diagnosis not present

## 2023-06-01 DIAGNOSIS — I509 Heart failure, unspecified: Secondary | ICD-10-CM | POA: Diagnosis not present

## 2023-06-01 DIAGNOSIS — E1122 Type 2 diabetes mellitus with diabetic chronic kidney disease: Secondary | ICD-10-CM | POA: Diagnosis not present

## 2023-06-01 DIAGNOSIS — I4891 Unspecified atrial fibrillation: Secondary | ICD-10-CM | POA: Diagnosis not present

## 2023-06-01 DIAGNOSIS — E785 Hyperlipidemia, unspecified: Secondary | ICD-10-CM | POA: Diagnosis not present

## 2023-06-01 DIAGNOSIS — I130 Hypertensive heart and chronic kidney disease with heart failure and stage 1 through stage 4 chronic kidney disease, or unspecified chronic kidney disease: Secondary | ICD-10-CM | POA: Diagnosis not present

## 2023-06-01 DIAGNOSIS — K8020 Calculus of gallbladder without cholecystitis without obstruction: Secondary | ICD-10-CM | POA: Diagnosis not present

## 2023-06-11 ENCOUNTER — Institutional Professional Consult (permissible substitution): Admitting: Cardiovascular Disease

## 2023-06-15 ENCOUNTER — Ambulatory Visit: Attending: Cardiovascular Disease | Admitting: Cardiovascular Disease

## 2023-06-15 ENCOUNTER — Encounter: Payer: Self-pay | Admitting: Cardiovascular Disease

## 2023-06-15 VITALS — BP 133/77 | HR 95 | Resp 16 | Ht 68.0 in | Wt 187.8 lb

## 2023-06-15 DIAGNOSIS — I4819 Other persistent atrial fibrillation: Secondary | ICD-10-CM
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
## 2023-06-15 NOTE — Patient Instructions (Signed)
 Medication Instructions:  Your physician recommends that you continue on your current medications as directed. Please refer to the Current Medication list given to you today. *If you need a refill on your cardiac medications before your next appointment, please call your pharmacy*  Lab Work: CBC and BMET - please have pre-procedure lab work completed on Monday, June 16. This can be done at ANY [facility] near you - no appointment required and this does not have to be fasting. If you have labs (blood work) drawn today and your tests are completely normal, you will receive your results only by: MyChart Message (if you have MyChart) OR A paper copy in the mail If you have any lab test that is abnormal or we need to change your treatment, we will call you to review the results.  Testing/Procedures: Atrial Fibrillation Ablation - scheduled on Wednesday, July 16. We will be in contact closer to your ablation date with further instructions Your physician has recommended that you have an ablation. Catheter ablation is a medical procedure used to treat some cardiac arrhythmias (irregular heartbeats). During catheter ablation, a long, thin, flexible tube is put into a blood vessel in your groin (upper thigh), or neck. This tube is called an ablation catheter. It is then guided to your heart through the blood vessel. Radio frequency waves destroy small areas of heart tissue where abnormal heartbeats may cause an arrhythmia to start. Please see the instruction sheet given to you today.  Follow-Up: At [facility], you and your health needs are our priority.  As part of our continuing mission to provide you with exceptional heart care, our providers are all part of one team.  This team includes your primary Cardiologist (physician) and Advanced Practice Providers or APPs (Physician Assistants and Nurse Practitioners) who all work together to provide you with the care you need, when you need it.  Your next  appointment:   We will schedule follow up after your ablation  Provider:   Marlane Silver, MD   Cardiac Ablation Cardiac ablation is a procedure to destroy, or ablate, a small amount of heart tissue that is causing problems. The heart has many electrical connections. Sometimes, these connections are abnormal and can cause the heart to beat very fast or irregularly. Ablating the abnormal areas can improve the heart's rhythm or return it to normal. Ablation may be done for people who: Have irregular or rapid heartbeats (arrhythmias). Have Wolff-Parkinson-White syndrome. Have taken medicines for an arrhythmia that did not work or caused side effects. Have a high-risk heartbeat that may be life-threatening. Tell a health care provider about: Any allergies you have. All medicines you are taking, including vitamins, herbs, eye drops, creams, and over-the-counter medicines. Any problems you or family members have had with anesthesia. Any bleeding problems you have. Any surgeries you have had. Any medical conditions you have. Whether you are pregnant or may be pregnant. What are the risks? Your health care provider will talk with you about risks. These may include: Infection. Bruising and bleeding. Stroke or blood clots. Damage to nearby structures or organs. Allergic reaction to medicines or dyes. Needing a pacemaker if the heart gets damaged. A pacemaker is a device that helps the heart beat normally. Failure of the procedure. A repeat procedure may be needed. What happens before the procedure? Medicines Ask your health care provider about: Changing or stopping your regular medicines. These include any heart rhythm medicines, diabetes medicines, or blood thinners you take. Taking medicines such as  aspirin  and ibuprofen. These medicines can thin your blood. Do not take them unless your health care provider tells you to. Taking over-the-counter medicines, vitamins, herbs, and  supplements. General instructions Follow instructions from your health care provider about what you may eat and drink. If you will be going home right after the procedure, plan to have a responsible adult: Take you home from the hospital or clinic. You will not be allowed to drive. Care for you for the time you are told. Ask your health care provider what steps will be taken to prevent infection. What happens during the procedure?  An IV will be inserted into one of your veins. You may be given: A sedative. This helps you relax. Anesthesia. This will: Numb certain areas of your body. An incision will be made in your neck or your groin. A needle will be inserted through the incision and into a large vein in your neck or groin. The small, thin tube (catheter) will be inserted through the needle and moved to your heart. A type of X-ray (fluoroscopy) will be used to help guide the catheter and provide images of the heart on a monitor. Dye may be injected through the catheter to help your surgeon see the area of the heart that needs treatment. Electrical currents will be sent from the catheter to destroy heart tissue in certain areas. There are three types of energy that may be used to do this: Heat (radiofrequency energy). Laser energy. Extreme cold (cryoablation). When the tissue has been destroyed, the catheter will be removed. Pressure will be held on the insertion area to prevent bleeding. A bandage (dressing) will be placed over the insertion area. The procedure may vary among health care providers and hospitals. What happens after the procedure? Your blood pressure, heart rate and rhythm, breathing rate, and blood oxygen level will be monitored until you leave the hospital or clinic. Your insertion area will be checked for bleeding. You will need to lie still for a few hours. If your groin was used, you will need to keep your leg straight for a few hours after the catheter is  removed. This information is not intended to replace advice given to you by your health care provider. Make sure you discuss any questions you have with your health care provider. Document Revised: 07/02/2021 Document Reviewed: 07/02/2021 Elsevier Patient Education  2024 [employer].

## 2023-07-15 ENCOUNTER — Other Ambulatory Visit (HOSPITAL_COMMUNITY): Payer: Self-pay | Admitting: Family Medicine

## 2023-07-15 DIAGNOSIS — I4819 Other persistent atrial fibrillation: Secondary | ICD-10-CM | POA: Diagnosis not present

## 2023-07-16 ENCOUNTER — Ambulatory Visit: Payer: Self-pay | Admitting: Cardiovascular Disease

## 2023-07-16 LAB — BASIC METABOLIC PANEL WITH GFR
BUN/Creatinine Ratio: 11 (ref 10–24)
BUN: 18 mg/dL (ref 8–27)
CO2: 24 mmol/L (ref 20–29)
Calcium: 9.8 mg/dL (ref 8.6–10.2)
Chloride: 102 mmol/L (ref 96–106)
Creatinine, Ser: 1.57 mg/dL — ABNORMAL HIGH (ref 0.76–1.27)
Glucose: 97 mg/dL (ref 70–99)
Potassium: 4 mmol/L (ref 3.5–5.2)
Sodium: 143 mmol/L (ref 134–144)
eGFR: 45 mL/min/{1.73_m2} — ABNORMAL LOW (ref >59)

## 2023-07-29 ENCOUNTER — Telehealth: Payer: Self-pay

## 2023-07-29 NOTE — Telephone Encounter (Signed)
 Spoke with pt's Sister and informed her of the time change for his procedure on 7/16 with Dr. Inocencio. He will need to arrive at [facility] at 630 am for a 830 am procedure time.   She said she would have him there.

## 2023-08-05 ENCOUNTER — Telehealth (HOSPITAL_COMMUNITY): Payer: Self-pay

## 2023-08-05 NOTE — Telephone Encounter (Signed)
 Attempted to reach patient to discuss upcoming procedure, no answer. Unable to leave message due to VM not set up.    Alternate number belongs to patient's sister but no DPR on file with permission to speak with her.

## 2023-08-11 NOTE — Pre-Procedure Instructions (Signed)
 Attempted to call patient.  No voicemail set up.  Spoke with patient's sister.  She will be the one bringing him tomorrow.  Reviewed the instructions with the sister.   Arrival time 0615 Nothing to eat or drink after midnight No meds AM of procedure Responsible person to drive you home and stay with you for 24 hrs  Have you missed any doses of anti-coagulant Eliquis - takes twice a day, hasn't missed any doses.  Don't take dose morning of procedure.

## 2023-08-12 ENCOUNTER — Ambulatory Visit (HOSPITAL_COMMUNITY)
Admission: RE | Disposition: A | Discharge status: Home / Self Care | Payer: Self-pay | Source: Home / Self Care | Attending: Cardiovascular Disease

## 2023-08-12 ENCOUNTER — Ambulatory Visit (HOSPITAL_COMMUNITY): Admission: RE | Admit: 2023-08-12 | Source: Home / Self Care | Admitting: Cardiovascular Disease

## 2023-08-12 ENCOUNTER — Ambulatory Visit (HOSPITAL_COMMUNITY): Admitting: Anesthesiology

## 2023-08-12 ENCOUNTER — Ambulatory Visit (HOSPITAL_COMMUNITY)

## 2023-08-12 ENCOUNTER — Other Ambulatory Visit: Payer: Self-pay

## 2023-08-12 ENCOUNTER — Encounter (HOSPITAL_COMMUNITY): Payer: Self-pay | Admitting: Cardiovascular Disease

## 2023-08-12 DIAGNOSIS — I700 Atherosclerosis of aorta: Secondary | ICD-10-CM | POA: Diagnosis not present

## 2023-08-12 DIAGNOSIS — Z7901 Long term (current) use of anticoagulants: Secondary | ICD-10-CM | POA: Insufficient documentation

## 2023-08-12 DIAGNOSIS — I110 Hypertensive heart disease with heart failure: Secondary | ICD-10-CM | POA: Diagnosis not present

## 2023-08-12 DIAGNOSIS — I340 Nonrheumatic mitral (valve) insufficiency: Secondary | ICD-10-CM | POA: Diagnosis not present

## 2023-08-12 DIAGNOSIS — I509 Heart failure, unspecified: Secondary | ICD-10-CM | POA: Diagnosis not present

## 2023-08-12 DIAGNOSIS — I4819 Other persistent atrial fibrillation: Secondary | ICD-10-CM

## 2023-08-12 DIAGNOSIS — I351 Nonrheumatic aortic (valve) insufficiency: Secondary | ICD-10-CM | POA: Diagnosis not present

## 2023-08-12 DIAGNOSIS — N189 Chronic kidney disease, unspecified: Secondary | ICD-10-CM | POA: Diagnosis not present

## 2023-08-12 DIAGNOSIS — N1832 Chronic kidney disease, stage 3b: Secondary | ICD-10-CM | POA: Diagnosis not present

## 2023-08-12 DIAGNOSIS — I480 Paroxysmal atrial fibrillation: Secondary | ICD-10-CM

## 2023-08-12 DIAGNOSIS — I130 Hypertensive heart and chronic kidney disease with heart failure and stage 1 through stage 4 chronic kidney disease, or unspecified chronic kidney disease: Secondary | ICD-10-CM | POA: Insufficient documentation

## 2023-08-12 DIAGNOSIS — I080 Rheumatic disorders of both mitral and aortic valves: Secondary | ICD-10-CM | POA: Insufficient documentation

## 2023-08-12 DIAGNOSIS — I5022 Chronic systolic (congestive) heart failure: Secondary | ICD-10-CM | POA: Insufficient documentation

## 2023-08-12 DIAGNOSIS — E1122 Type 2 diabetes mellitus with diabetic chronic kidney disease: Secondary | ICD-10-CM | POA: Diagnosis not present

## 2023-08-12 DIAGNOSIS — D6869 Other thrombophilia: Secondary | ICD-10-CM | POA: Diagnosis not present

## 2023-08-12 DIAGNOSIS — I4891 Unspecified atrial fibrillation: Secondary | ICD-10-CM | POA: Diagnosis not present

## 2023-08-12 DIAGNOSIS — I5043 Acute on chronic combined systolic (congestive) and diastolic (congestive) heart failure: Secondary | ICD-10-CM | POA: Diagnosis not present

## 2023-08-12 HISTORY — PX: ATRIAL FIBRILLATION ABLATION: EP1191

## 2023-08-12 HISTORY — PX: TRANSESOPHAGEAL ECHOCARDIOGRAM (CATH LAB): EP1270

## 2023-08-12 LAB — CBC
HCT: 46.9 % (ref 39.0–52.0)
Hemoglobin: 15 g/dL (ref 13.0–17.0)
MCH: 27.7 pg (ref 26.0–34.0)
MCHC: 32 g/dL (ref 30.0–36.0)
MCV: 86.7 fL (ref 80.0–100.0)
Platelets: 118 K/uL — ABNORMAL LOW (ref 150–400)
RBC: 5.41 MIL/uL (ref 4.22–5.81)
RDW: 16.1 % — ABNORMAL HIGH (ref 11.5–15.5)
WBC: 5 K/uL (ref 4.0–10.5)
nRBC: 0 % (ref 0.0–0.2)

## 2023-08-12 LAB — ECHO TEE

## 2023-08-12 LAB — GLUCOSE, CAPILLARY: Glucose-Capillary: 114 mg/dL — ABNORMAL HIGH (ref 70–99)

## 2023-08-12 LAB — POCT ACTIVATED CLOTTING TIME: Activated Clotting Time: 406 s

## 2023-08-12 SURGERY — ATRIAL FIBRILLATION ABLATION
Anesthesia: General

## 2023-08-12 MED ORDER — ATROPINE SULFATE 1 MG/10ML IJ SOSY
PREFILLED_SYRINGE | INTRAMUSCULAR | Status: AC
  Filled 2023-08-12: qty 10

## 2023-08-12 MED ORDER — SODIUM CHLORIDE 0.9 % IV SOLN: INTRAVENOUS | Status: DC

## 2023-08-12 MED ORDER — FENTANYL CITRATE (PF) 100 MCG/2ML IJ SOLN
INTRAMUSCULAR | Status: AC
  Filled 2023-08-12: qty 2

## 2023-08-12 MED ORDER — SODIUM CHLORIDE 0.9% FLUSH: 3.0000 mL | INTRAVENOUS | Status: DC | PRN

## 2023-08-12 MED ORDER — PROTAMINE SULFATE 10 MG/ML IV SOLN
INTRAVENOUS | Status: AC
  Filled 2023-08-12: qty 10

## 2023-08-12 MED ORDER — PHENYLEPHRINE HCL-NACL 20-0.9 MG/250ML-% IV SOLN
INTRAVENOUS | Status: AC
  Filled 2023-08-12: qty 500

## 2023-08-12 MED ORDER — SODIUM CHLORIDE 0.9 % IV SOLN: 250.0000 mL | INTRAVENOUS | Status: DC | PRN

## 2023-08-12 MED ORDER — SUGAMMADEX SODIUM 200 MG/2ML IV SOLN
INTRAVENOUS | Status: DC | PRN
  Administered 2023-08-12: 200 mg via INTRAVENOUS

## 2023-08-12 MED ORDER — ATROPINE SULFATE 1 MG/10ML IJ SOSY
PREFILLED_SYRINGE | INTRAMUSCULAR | Status: DC | PRN
Start: 2023-08-12 — End: 2023-08-12
  Administered 2023-08-12: 1 mg via INTRAVENOUS

## 2023-08-12 MED ORDER — VASOPRESSIN 20 UNIT/ML IV SOLN
INTRAVENOUS | Status: AC
  Filled 2023-08-12: qty 1

## 2023-08-12 MED ORDER — FENTANYL CITRATE (PF) 250 MCG/5ML IJ SOLN
INTRAMUSCULAR | Status: DC | PRN
  Administered 2023-08-12 (×2): 50 ug via INTRAVENOUS

## 2023-08-12 MED ORDER — PROPOFOL 10 MG/ML IV BOLUS
INTRAVENOUS | Status: DC | PRN
Start: 2023-08-12 — End: 2023-08-12
  Administered 2023-08-12: 30 mg via INTRAVENOUS

## 2023-08-12 MED ORDER — PROTAMINE SULFATE 10 MG/ML IV SOLN
INTRAVENOUS | Status: DC | PRN
  Administered 2023-08-12: 50 mg via INTRAVENOUS

## 2023-08-12 MED ORDER — HEPARIN (PORCINE) IN NACL 1000-0.9 UT/500ML-% IV SOLN
INTRAVENOUS | Status: DC | PRN
  Administered 2023-08-12 (×3): 500 mL

## 2023-08-12 MED ORDER — ETOMIDATE 2 MG/ML IV SOLN
INTRAVENOUS | Status: DC | PRN
  Administered 2023-08-12: 8 mg via INTRAVENOUS

## 2023-08-12 MED ORDER — PHENYLEPHRINE 80 MCG/ML (10ML) SYRINGE FOR IV PUSH (FOR BLOOD PRESSURE SUPPORT)
PREFILLED_SYRINGE | INTRAVENOUS | Status: DC | PRN
  Administered 2023-08-12: 40 ug via INTRAVENOUS
  Administered 2023-08-12 (×3): 80 ug via INTRAVENOUS

## 2023-08-12 MED ORDER — ROCURONIUM BROMIDE 10 MG/ML (PF) SYRINGE
PREFILLED_SYRINGE | INTRAVENOUS | Status: DC | PRN
  Administered 2023-08-12: 20 mg via INTRAVENOUS
  Administered 2023-08-12: 10 mg via INTRAVENOUS
  Administered 2023-08-12: 40 mg via INTRAVENOUS
  Administered 2023-08-12: 10 mg via INTRAVENOUS

## 2023-08-12 MED ORDER — DEXAMETHASONE SODIUM PHOSPHATE 10 MG/ML IJ SOLN
INTRAMUSCULAR | Status: DC | PRN
  Administered 2023-08-12: 5 mg via INTRAVENOUS

## 2023-08-12 MED ORDER — HEPARIN SODIUM (PORCINE) 1000 UNIT/ML IJ SOLN
INTRAMUSCULAR | Status: DC | PRN
Start: 2023-08-12 — End: 2023-08-12
  Administered 2023-08-12: 16000 [IU] via INTRAVENOUS

## 2023-08-12 MED ORDER — HEPARIN SODIUM (PORCINE) 1000 UNIT/ML IJ SOLN
INTRAMUSCULAR | Status: AC
  Filled 2023-08-12: qty 20

## 2023-08-12 MED ORDER — PHENYLEPHRINE HCL-NACL 20-0.9 MG/250ML-% IV SOLN
INTRAVENOUS | Status: DC | PRN
  Administered 2023-08-12: 15 ug/min via INTRAVENOUS

## 2023-08-12 MED ORDER — ONDANSETRON HCL 4 MG/2ML IJ SOLN: 4.0000 mg | Freq: Four times a day (QID) | INTRAMUSCULAR | Status: DC | PRN

## 2023-08-12 MED ORDER — ACETAMINOPHEN 325 MG PO TABS: 650.0000 mg | ORAL_TABLET | ORAL | Status: DC | PRN

## 2023-08-12 SURGICAL SUPPLY — 19 items
BAG SNAP BAND KOVER 36X36 (MISCELLANEOUS) IMPLANT
CABLE FARASTAR GEN2 SNGL USE (CABLE) IMPLANT
CATH FARAWAVE 2.0 35 (CATHETERS) IMPLANT
CATH GE 8FR SOUNDSTAR (CATHETERS) IMPLANT
CATH OCTARAY 2.0 F 3-3-3-3-3 (CATHETERS) IMPLANT
CATH WEBSTER BI DIR CS D-F CRV (CATHETERS) IMPLANT
CLOSURE PERCLOSE PROSTYLE (VASCULAR PRODUCTS) IMPLANT
COVER SWIFTLINK CONNECTOR (BAG) ×2 IMPLANT
DEVICE CLOSURE MYNXGRIP 6/7F (Vascular Products) IMPLANT
DILATOR VESSEL 38 20CM 16FR (INTRODUCER) IMPLANT
GUIDEWIRE INQWIRE 1.5J.035X260 (WIRE) IMPLANT
KIT VERSACROSS CNCT FARADRIVE (KITS) IMPLANT
PACK EP LF (CUSTOM PROCEDURE TRAY) ×2 IMPLANT
PAD DEFIB RADIO PHYSIO CONN (PAD) ×2 IMPLANT
PATCH CARTO3 (PAD) IMPLANT
SHEATH FARADRIVE STEERABLE (SHEATH) IMPLANT
SHEATH PINNACLE 8F 10CM (SHEATH) IMPLANT
SHEATH PINNACLE 9F 10CM (SHEATH) IMPLANT
SHEATH PROBE COVER 6X72 (BAG) IMPLANT
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
## 2023-08-12 NOTE — Progress Notes (Signed)
 Patient arrived to cath lab holding bay 20. Patient alert but drowsy. Answers questions and follows commands appropriately. No neuro deficits noted. Bilateral groin dressings are dry/intact with no bleeding or hematoma noted. Post activity explained. EKG obtained per order. Will continue to monitor and reinforce precautions per orders and protocol.Kamariyah Timberlake E

## 2023-08-12 NOTE — Transfer of Care (Signed)
 Immediate Anesthesia Transfer of Care Note  Patient: Phillip Wilson  Procedure(s) Performed: ATRIAL FIBRILLATION ABLATION TRANSESOPHAGEAL ECHOCARDIOGRAM  Patient Location: PACU  Anesthesia Type:General  Level of Consciousness: awake, alert , and oriented  Airway & Oxygen Therapy: Patient Spontanous Breathing and Patient connected to face mask oxygen  Post-op Assessment: Report given to RN and Post -op Vital signs reviewed and stable  Post vital signs: Reviewed and stable  Last Vitals:  Vitals Value Taken Time  BP 114/74 08/12/23 11:05  Temp 36.4 C 08/12/23 11:01  Pulse 60 08/12/23 11:06  Resp 20 08/12/23 11:06  SpO2 97 % 08/12/23 11:06  Vitals shown include unfiled device data.  Last Pain:  Vitals:   08/12/23 1101  TempSrc: Axillary  PainSc: 0-No pain         Complications: No notable events documented.

## 2023-08-12 NOTE — Progress Notes (Signed)
Patient and sister was given discharge instructions. Both verbalized understanding. 

## 2023-08-12 NOTE — Progress Notes (Signed)
 Echocardiogram Echocardiogram Transesophageal has been performed.  Koleen KANDICE Popper, RDCS 08/12/2023, 9:47 AM

## 2023-08-12 NOTE — Anesthesia Preprocedure Evaluation (Addendum)
 Anesthesia Evaluation  Patient identified by MRN, date of birth, ID band Patient awake    Reviewed: Allergy & Precautions, NPO status , Patient's Chart, lab work & pertinent test results  Airway Mallampati: I  TM Distance: >3 FB Neck ROM: Full    Dental  (+) Upper Dentures, Edentulous Lower   Pulmonary neg pulmonary ROS   breath sounds clear to auscultation       Cardiovascular hypertension, +CHF  + dysrhythmias Atrial Fibrillation  Rhythm:Irregular Rate:Abnormal  Echo:  1. Left ventricular ejection fraction, by estimation, is 30%. The left  ventricle has moderate to severely decreased function. The left ventricle  demonstrates global hypokinesis. Prominent apical trabeculations, consider  noncompaction. The left  ventricular internal cavity size was mildly dilated. There is mild  concentric left ventricular hypertrophy. Left ventricular diastolic  parameters are indeterminate.   2. Right ventricular systolic function is mildly reduced. The right  ventricular size is normal. There is normal pulmonary artery systolic  pressure. The estimated right ventricular systolic pressure is 35.2 mmHg.   3. Left atrial size was moderately dilated.   4. The aortic valve is tricuspid. Aortic valve regurgitation is mild. No  aortic stenosis is present.   5. The mitral valve is abnormal. Moderate functional mitral valve  regurgitation. No evidence of mitral stenosis.   6. The inferior vena cava is normal in size with <50% respiratory  variability, suggesting right atrial pressure of 8 mmHg.     Neuro/Psych  Neuromuscular disease    GI/Hepatic Neg liver ROS, hiatal hernia,GERD  ,,  Endo/Other  diabetes    Renal/GU Renal disease     Musculoskeletal  (+) Arthritis ,    Abdominal   Peds  Hematology  (+) Blood dyscrasia, anemia   Anesthesia Other Findings   Reproductive/Obstetrics                               Anesthesia Physical Anesthesia Plan  ASA: 4  Anesthesia Plan: General   Post-op Pain Management: Minimal or no pain anticipated   Induction: Intravenous  PONV Risk Score and Plan: 2 and Ondansetron  and Treatment may vary due to age or medical condition  Airway Management Planned: Oral ETT  Additional Equipment: ClearSight  Intra-op Plan:   Post-operative Plan: Extubation in OR  Informed Consent: I have reviewed the patients History and Physical, chart, labs and discussed the procedure including the risks, benefits and alternatives for the proposed anesthesia with the patient or authorized representative who has indicated his/her understanding and acceptance.       Plan Discussed with: CRNA  Anesthesia Plan Comments:          Anesthesia Quick Evaluation

## 2023-08-12 NOTE — Discharge Instructions (Signed)

## 2023-08-12 NOTE — Anesthesia Procedure Notes (Addendum)
 Procedure Name: Intubation Date/Time: 08/12/2023 8:39 AM  Performed by: Worth Peppers, CRNAPre-anesthesia Checklist: Patient identified, Emergency Drugs available, Suction available and Patient being monitored Patient Re-evaluated:Patient Re-evaluated prior to induction Oxygen Delivery Method: Circle System Utilized Preoxygenation: Pre-oxygenation with 100% oxygen Induction Type: IV induction Ventilation: Mask ventilation without difficulty Laryngoscope Size: Mac and 3 Tube type: Oral Tube size: 7.5 mm Number of attempts: 1 Airway Equipment and Method: Stylet and Oral airway Placement Confirmation: ETT inserted through vocal cords under direct vision, positive ETCO2 and breath sounds checked- equal and bilateral Secured at: 23 cm Tube secured with: Tape Dental Injury: Teeth and Oropharynx as per pre-operative assessment

## 2023-08-12 NOTE — Progress Notes (Signed)
 Heart Failure Navigator Progress Note  Assessed for Heart & Vascular TOC clinic readiness.  Patient does not meet criteria due to Advanced Heart Failure Team patient of Dr. Mclean.   Navigator will sign off at this time.   Randie Bustle, BSN, Scientist, clinical (histocompatibility and immunogenetics) Only

## 2023-08-13 ENCOUNTER — Other Ambulatory Visit (HOSPITAL_COMMUNITY): Payer: Self-pay | Admitting: Cardiology

## 2023-08-13 ENCOUNTER — Telehealth (HOSPITAL_COMMUNITY): Payer: Self-pay

## 2023-08-13 MED FILL — Fentanyl Citrate Preservative Free (PF) Inj 100 MCG/2ML: INTRAMUSCULAR | Qty: 2 | Status: AC

## 2023-08-13 NOTE — Anesthesia Postprocedure Evaluation (Signed)
 Anesthesia Post Note  Patient: Phillip Wilson  Procedure(s) Performed: ATRIAL FIBRILLATION ABLATION TRANSESOPHAGEAL ECHOCARDIOGRAM     Patient location during evaluation: PACU Anesthesia Type: General Level of consciousness: awake and alert Pain management: pain level controlled Vital Signs Assessment: post-procedure vital signs reviewed and stable Respiratory status: spontaneous breathing, nonlabored ventilation, respiratory function stable and patient connected to nasal cannula oxygen Cardiovascular status: blood pressure returned to baseline and stable Postop Assessment: no apparent nausea or vomiting Anesthetic complications: no   No notable events documented.  Last Vitals:  Vitals:   08/12/23 1315 08/12/23 1330  BP: 119/69 (!) 144/87  Pulse: (!) 55 82  Resp: 20 20  Temp:    SpO2: 95% 94%    Last Pain:  Vitals:   08/12/23 1129  TempSrc: Axillary  PainSc:                  Franky JONETTA Bald

## 2023-08-13 NOTE — Telephone Encounter (Signed)
 Spoke with patient to complete post procedure follow up call.  Patient reports no complications with groin sites.   Instructions reviewed with patient:  Remove large bandage at puncture site after 24 hours. It is normal to have bruising, tenderness, mild swelling, and a pea or marble sized lump/knot at the groin site which can take up to three months to resolve.  Get help right away if you notice sudden swelling at the puncture site.  Check your puncture site every day for signs of infection: fever, redness, swelling, pus drainage, warmth, foul odor or excessive pain. If this occurs, please call the office at [phone], to speak with the nurse. Get help right away if your puncture site is bleeding and the bleeding does not stop after applying firm pressure to the area.  You may continue to have skipped beats/ atrial fibrillation during the first several months after your procedure.  It is very important not to miss any doses of your blood thinner Eliquis .    You will follow up with the Afib clinic on 09/09/23 and follow up with the APP on 11/09/23.    Patient verbalized understanding to all instructions provided.

## 2023-08-20 ENCOUNTER — Encounter: Payer: Self-pay | Admitting: Emergency Medicine

## 2023-09-09 ENCOUNTER — Ambulatory Visit (HOSPITAL_COMMUNITY): Admitting: Physician Assistant

## 2023-10-13 ENCOUNTER — Other Ambulatory Visit (HOSPITAL_COMMUNITY): Payer: Self-pay

## 2023-10-13 ENCOUNTER — Other Ambulatory Visit (HOSPITAL_COMMUNITY): Payer: Self-pay | Admitting: Family Medicine

## 2023-10-13 DIAGNOSIS — I5022 Chronic systolic (congestive) heart failure: Secondary | ICD-10-CM

## 2023-10-13 MED ORDER — FUROSEMIDE 20 MG PO TABS: ORAL_TABLET | ORAL | 5 refills | Status: DC

## 2023-10-20 ENCOUNTER — Other Ambulatory Visit (HOSPITAL_COMMUNITY): Payer: Self-pay | Admitting: Family Medicine

## 2023-10-30 ENCOUNTER — Encounter: Payer: Self-pay | Admitting: *Deleted

## 2023-10-30 NOTE — Progress Notes (Signed)
 Phillip Wilson                                          MRN: [id number]   10/30/2023   The [facility]BCI Quality Team Specialist reviewed this patient medical record for the purposes of chart review for care gap closure. The following were reviewed: chart review for care gap closure-kidney health evaluation for diabetes:eGFR  and uACR.    VBCI Quality Team

## 2023-11-09 ENCOUNTER — Ambulatory Visit: Admitting: Physician Assistant

## 2023-11-18 ENCOUNTER — Telehealth (HOSPITAL_COMMUNITY): Payer: Self-pay | Admitting: Cardiology

## 2023-11-18 NOTE — Telephone Encounter (Signed)
 Called to confirm/remind patient of their appointment at the [facility] on 11/18/23.   Appointment:   [x] Confirmed  [] Left mess   [] No answer/No voice mail  [] VM Full/unable to leave message  [] Phone not in service  Patient reminded to bring all medications and/or complete list.  Confirmed patient has transportation. Gave directions, instructed to utilize valet parking.

## 2023-11-19 ENCOUNTER — Ambulatory Visit (HOSPITAL_COMMUNITY): Admitting: Cardiology

## 2023-11-23 ENCOUNTER — Other Ambulatory Visit (HOSPITAL_COMMUNITY): Payer: Self-pay | Admitting: Cardiology

## 2023-12-01 ENCOUNTER — Ambulatory Visit (HOSPITAL_COMMUNITY): Admitting: Cardiology

## 2023-12-01 ENCOUNTER — Encounter (HOSPITAL_COMMUNITY): Payer: Self-pay | Admitting: Cardiology

## 2023-12-01 ENCOUNTER — Ambulatory Visit (HOSPITAL_COMMUNITY): Payer: Self-pay | Admitting: Cardiology

## 2023-12-01 ENCOUNTER — Ambulatory Visit (HOSPITAL_COMMUNITY)
Admission: RE | Admit: 2023-12-01 | Discharge: 2023-12-01 | Disposition: A | Source: Ambulatory Visit | Attending: Cardiology | Admitting: Cardiology

## 2023-12-01 VITALS — BP 130/80 | HR 80 | Wt 197.6 lb

## 2023-12-01 DIAGNOSIS — Z7984 Long term (current) use of oral hypoglycemic drugs: Secondary | ICD-10-CM | POA: Insufficient documentation

## 2023-12-01 DIAGNOSIS — Z8679 Personal history of other diseases of the circulatory system: Secondary | ICD-10-CM | POA: Insufficient documentation

## 2023-12-01 DIAGNOSIS — I340 Nonrheumatic mitral (valve) insufficiency: Secondary | ICD-10-CM | POA: Insufficient documentation

## 2023-12-01 DIAGNOSIS — Z79899 Other long term (current) drug therapy: Secondary | ICD-10-CM | POA: Insufficient documentation

## 2023-12-01 DIAGNOSIS — Z7901 Long term (current) use of anticoagulants: Secondary | ICD-10-CM | POA: Insufficient documentation

## 2023-12-01 DIAGNOSIS — I429 Cardiomyopathy, unspecified: Secondary | ICD-10-CM | POA: Insufficient documentation

## 2023-12-01 DIAGNOSIS — I4819 Other persistent atrial fibrillation: Secondary | ICD-10-CM | POA: Insufficient documentation

## 2023-12-01 DIAGNOSIS — D6869 Other thrombophilia: Secondary | ICD-10-CM | POA: Diagnosis not present

## 2023-12-01 DIAGNOSIS — I5022 Chronic systolic (congestive) heart failure: Secondary | ICD-10-CM | POA: Insufficient documentation

## 2023-12-01 DIAGNOSIS — D696 Thrombocytopenia, unspecified: Secondary | ICD-10-CM | POA: Diagnosis not present

## 2023-12-01 DIAGNOSIS — N1832 Chronic kidney disease, stage 3b: Secondary | ICD-10-CM | POA: Insufficient documentation

## 2023-12-01 DIAGNOSIS — E039 Hypothyroidism, unspecified: Secondary | ICD-10-CM | POA: Insufficient documentation

## 2023-12-01 DIAGNOSIS — E1122 Type 2 diabetes mellitus with diabetic chronic kidney disease: Secondary | ICD-10-CM | POA: Insufficient documentation

## 2023-12-01 DIAGNOSIS — I130 Hypertensive heart and chronic kidney disease with heart failure and stage 1 through stage 4 chronic kidney disease, or unspecified chronic kidney disease: Secondary | ICD-10-CM | POA: Insufficient documentation

## 2023-12-01 DIAGNOSIS — R000 Tachycardia, unspecified: Secondary | ICD-10-CM | POA: Diagnosis not present

## 2023-12-01 DIAGNOSIS — I472 Ventricular tachycardia, unspecified: Secondary | ICD-10-CM | POA: Diagnosis not present

## 2023-12-01 DIAGNOSIS — N179 Acute kidney failure, unspecified: Secondary | ICD-10-CM | POA: Diagnosis not present

## 2023-12-01 LAB — CBC
HCT: 51.7 % (ref 39.0–52.0)
Hemoglobin: 16.8 g/dL (ref 13.0–17.0)
MCH: 27.4 pg (ref 26.0–34.0)
MCHC: 32.5 g/dL (ref 30.0–36.0)
MCV: 84.3 fL (ref 80.0–100.0)
Platelets: 137 K/uL — ABNORMAL LOW (ref 150–400)
RBC: 6.13 MIL/uL — ABNORMAL HIGH (ref 4.22–5.81)
RDW: 15.8 % — ABNORMAL HIGH (ref 11.5–15.5)
WBC: 6 K/uL (ref 4.0–10.5)
nRBC: 0 % (ref 0.0–0.2)

## 2023-12-01 LAB — BASIC METABOLIC PANEL WITH GFR
Anion gap: 11 (ref 5–15)
BUN: 18 mg/dL (ref 8–23)
CO2: 29 mmol/L (ref 22–32)
Calcium: 9.4 mg/dL (ref 8.9–10.3)
Chloride: 99 mmol/L (ref 98–111)
Creatinine, Ser: 1.74 mg/dL — ABNORMAL HIGH (ref 0.61–1.24)
GFR, Estimated: 40 mL/min — ABNORMAL LOW (ref >60)
Glucose, Bld: 113 mg/dL — ABNORMAL HIGH (ref 70–99)
Potassium: 4.4 mmol/L (ref 3.5–5.1)
Sodium: 139 mmol/L (ref 135–145)

## 2023-12-01 LAB — BRAIN NATRIURETIC PEPTIDE: B Natriuretic Peptide: 172.1 pg/mL — ABNORMAL HIGH (ref 0.0–100.0)

## 2023-12-01 MED ORDER — METOPROLOL SUCCINATE ER 50 MG PO TB24: 50.0000 mg | ORAL_TABLET | Freq: Every day | ORAL | 3 refills | Status: DC
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
## 2023-12-01 NOTE — Patient Instructions (Addendum)
 INCREASE Toprol  to 50 mg daily.  Labs done today, your results will be available in MyChart, we will contact you for abnormal readings.  Your physician recommends that you schedule a follow-up appointment in: 3 months.  If you have any questions or concerns before your next appointment please send us  a message through [place] or call our office at [phone].    TO LEAVE A MESSAGE FOR THE NURSE SELECT OPTION 2, PLEASE LEAVE A MESSAGE INCLUDING: YOUR NAME DATE OF BIRTH CALL BACK NUMBER REASON FOR CALL**this is important as we prioritize the call backs  YOU WILL RECEIVE A CALL BACK THE SAME DAY AS LONG AS YOU CALL BEFORE 4:00 PM  At the [facility], you and your health needs are our priority. As part of our continuing mission to provide you with exceptional heart care, we have created designated Provider Care Teams. These Care Teams include your primary Cardiologist (physician) and Advanced Practice Providers (APPs- Physician Assistants and Nurse Practitioners) who all work together to provide you with the care you need, when you need it.   You may see any of the following providers on your designated Care Team at your next follow up: Dr Toribio Fuel Dr Ezra Shuck Dr. Morene Brownie Greig Mosses, NP Caffie Shed, [place] [facility] [place], [place] Beckey Coe, NP Jordan Lee, NP Ellouise Class, NP Tinnie Redman, PharmD Jaun Bash, PharmD   Please be sure to bring in all your medications bottles to every appointment.    Thank you for choosing [facility]

## 2023-12-04 ENCOUNTER — Other Ambulatory Visit: Payer: Self-pay

## 2023-12-04 ENCOUNTER — Encounter (HOSPITAL_COMMUNITY): Payer: Self-pay | Admitting: *Deleted

## 2023-12-04 ENCOUNTER — Inpatient Hospital Stay (HOSPITAL_COMMUNITY)
Admission: EM | Admit: 2023-12-04 | Discharge: 2023-12-07 | DRG: 683 | Disposition: A | Discharge status: Home / Self Care | Attending: Family Medicine | Admitting: Family Medicine

## 2023-12-04 ENCOUNTER — Ambulatory Visit: Attending: Cardiovascular Disease | Admitting: Cardiovascular Disease

## 2023-12-04 ENCOUNTER — Encounter: Payer: Self-pay | Admitting: Cardiovascular Disease

## 2023-12-04 ENCOUNTER — Emergency Department (HOSPITAL_COMMUNITY)

## 2023-12-04 VITALS — BP 98/62 | HR 132 | Ht 68.0 in | Wt 174.0 lb

## 2023-12-04 DIAGNOSIS — I441 Atrioventricular block, second degree: Secondary | ICD-10-CM | POA: Diagnosis present

## 2023-12-04 DIAGNOSIS — E782 Mixed hyperlipidemia: Secondary | ICD-10-CM | POA: Diagnosis present

## 2023-12-04 DIAGNOSIS — Z5941 Food insecurity: Secondary | ICD-10-CM

## 2023-12-04 DIAGNOSIS — D696 Thrombocytopenia, unspecified: Secondary | ICD-10-CM | POA: Diagnosis present

## 2023-12-04 DIAGNOSIS — I5022 Chronic systolic (congestive) heart failure: Secondary | ICD-10-CM | POA: Diagnosis present

## 2023-12-04 DIAGNOSIS — Z7901 Long term (current) use of anticoagulants: Secondary | ICD-10-CM | POA: Diagnosis not present

## 2023-12-04 DIAGNOSIS — E860 Dehydration: Secondary | ICD-10-CM | POA: Diagnosis present

## 2023-12-04 DIAGNOSIS — Z888 Allergy status to other drugs, medicaments and biological substances status: Secondary | ICD-10-CM

## 2023-12-04 DIAGNOSIS — E1121 Type 2 diabetes mellitus with diabetic nephropathy: Secondary | ICD-10-CM | POA: Diagnosis present

## 2023-12-04 DIAGNOSIS — I4819 Other persistent atrial fibrillation: Secondary | ICD-10-CM | POA: Diagnosis present

## 2023-12-04 DIAGNOSIS — R748 Abnormal levels of other serum enzymes: Secondary | ICD-10-CM | POA: Diagnosis present

## 2023-12-04 DIAGNOSIS — I5042 Chronic combined systolic (congestive) and diastolic (congestive) heart failure: Secondary | ICD-10-CM | POA: Diagnosis not present

## 2023-12-04 DIAGNOSIS — E1122 Type 2 diabetes mellitus with diabetic chronic kidney disease: Secondary | ICD-10-CM | POA: Diagnosis present

## 2023-12-04 DIAGNOSIS — I480 Paroxysmal atrial fibrillation: Secondary | ICD-10-CM

## 2023-12-04 DIAGNOSIS — E039 Hypothyroidism, unspecified: Secondary | ICD-10-CM | POA: Diagnosis present

## 2023-12-04 DIAGNOSIS — I340 Nonrheumatic mitral (valve) insufficiency: Secondary | ICD-10-CM | POA: Diagnosis present

## 2023-12-04 DIAGNOSIS — N1832 Chronic kidney disease, stage 3b: Secondary | ICD-10-CM | POA: Diagnosis present

## 2023-12-04 DIAGNOSIS — I472 Ventricular tachycardia, unspecified: Secondary | ICD-10-CM | POA: Diagnosis present

## 2023-12-04 DIAGNOSIS — D6869 Other thrombophilia: Secondary | ICD-10-CM | POA: Diagnosis present

## 2023-12-04 DIAGNOSIS — R001 Bradycardia, unspecified: Secondary | ICD-10-CM | POA: Diagnosis not present

## 2023-12-04 DIAGNOSIS — I4891 Unspecified atrial fibrillation: Principal | ICD-10-CM

## 2023-12-04 DIAGNOSIS — M109 Gout, unspecified: Secondary | ICD-10-CM | POA: Diagnosis present

## 2023-12-04 DIAGNOSIS — Z5948 Other specified lack of adequate food: Secondary | ICD-10-CM

## 2023-12-04 DIAGNOSIS — Z8249 Family history of ischemic heart disease and other diseases of the circulatory system: Secondary | ICD-10-CM

## 2023-12-04 DIAGNOSIS — I493 Ventricular premature depolarization: Secondary | ICD-10-CM | POA: Diagnosis not present

## 2023-12-04 DIAGNOSIS — K219 Gastro-esophageal reflux disease without esophagitis: Secondary | ICD-10-CM | POA: Diagnosis present

## 2023-12-04 DIAGNOSIS — Z7984 Long term (current) use of oral hypoglycemic drugs: Secondary | ICD-10-CM

## 2023-12-04 DIAGNOSIS — Z833 Family history of diabetes mellitus: Secondary | ICD-10-CM | POA: Diagnosis not present

## 2023-12-04 DIAGNOSIS — I130 Hypertensive heart and chronic kidney disease with heart failure and stage 1 through stage 4 chronic kidney disease, or unspecified chronic kidney disease: Secondary | ICD-10-CM | POA: Diagnosis present

## 2023-12-04 DIAGNOSIS — Z79899 Other long term (current) drug therapy: Secondary | ICD-10-CM

## 2023-12-04 DIAGNOSIS — Z604 Social exclusion and rejection: Secondary | ICD-10-CM | POA: Diagnosis present

## 2023-12-04 DIAGNOSIS — N179 Acute kidney failure, unspecified: Principal | ICD-10-CM | POA: Diagnosis present

## 2023-12-04 DIAGNOSIS — Z91199 Patient's noncompliance with other medical treatment and regimen due to unspecified reason: Secondary | ICD-10-CM

## 2023-12-04 DIAGNOSIS — R000 Tachycardia, unspecified: Secondary | ICD-10-CM | POA: Diagnosis present

## 2023-12-04 LAB — PROTIME-INR
INR: 1.4 — ABNORMAL HIGH (ref 0.8–1.2)
Prothrombin Time: 17.9 s — ABNORMAL HIGH (ref 11.4–15.2)

## 2023-12-04 LAB — COMPREHENSIVE METABOLIC PANEL WITH GFR
ALT: 8 U/L (ref 0–44)
AST: 29 U/L (ref 15–41)
Albumin: 4.4 g/dL (ref 3.5–5.0)
Alkaline Phosphatase: 163 U/L — ABNORMAL HIGH (ref 38–126)
Anion gap: 12 (ref 5–15)
BUN: 40 mg/dL — ABNORMAL HIGH (ref 8–23)
CO2: 29 mmol/L (ref 22–32)
Calcium: 9.8 mg/dL (ref 8.9–10.3)
Chloride: 102 mmol/L (ref 98–111)
Creatinine, Ser: 2.54 mg/dL — ABNORMAL HIGH (ref 0.61–1.24)
GFR, Estimated: 25 mL/min — ABNORMAL LOW (ref >60)
Glucose, Bld: 112 mg/dL — ABNORMAL HIGH (ref 70–99)
Potassium: 4.4 mmol/L (ref 3.5–5.1)
Sodium: 143 mmol/L (ref 135–145)
Total Bilirubin: 2.2 mg/dL — ABNORMAL HIGH (ref 0.0–1.2)
Total Protein: 7.6 g/dL (ref 6.5–8.1)

## 2023-12-04 LAB — CBC WITH DIFFERENTIAL/PLATELET
Abs Immature Granulocytes: 0.03 K/uL (ref 0.00–0.07)
Basophils Absolute: 0.1 K/uL (ref 0.0–0.1)
Basophils Relative: 1 %
Eosinophils Absolute: 0 K/uL (ref 0.0–0.5)
Eosinophils Relative: 0 %
HCT: 51.9 % (ref 39.0–52.0)
Hemoglobin: 16.9 g/dL (ref 13.0–17.0)
Immature Granulocytes: 0 %
Lymphocytes Relative: 11 %
Lymphs Abs: 1 K/uL (ref 0.7–4.0)
MCH: 27.8 pg (ref 26.0–34.0)
MCHC: 32.6 g/dL (ref 30.0–36.0)
MCV: 85.5 fL (ref 80.0–100.0)
Monocytes Absolute: 0.8 K/uL (ref 0.1–1.0)
Monocytes Relative: 8 %
Neutro Abs: 7.6 K/uL (ref 1.7–7.7)
Neutrophils Relative %: 80 %
Platelets: 147 K/uL — ABNORMAL LOW (ref 150–400)
RBC: 6.07 MIL/uL — ABNORMAL HIGH (ref 4.22–5.81)
RDW: 16.9 % — ABNORMAL HIGH (ref 11.5–15.5)
WBC: 9.5 K/uL (ref 4.0–10.5)
nRBC: 0 % (ref 0.0–0.2)

## 2023-12-04 LAB — TROPONIN T, HIGH SENSITIVITY
Troponin T High Sensitivity: 32 ng/L — ABNORMAL HIGH (ref 0–19)
Troponin T High Sensitivity: 28 ng/L — ABNORMAL HIGH (ref 0–19)

## 2023-12-04 LAB — GLUCOSE, CAPILLARY
Glucose-Capillary: 104 mg/dL — ABNORMAL HIGH (ref 70–99)
Glucose-Capillary: 138 mg/dL — ABNORMAL HIGH (ref 70–99)

## 2023-12-04 LAB — MAGNESIUM: Magnesium: 2.3 mg/dL (ref 1.7–2.4)

## 2023-12-04 MED ORDER — SODIUM CHLORIDE 0.9 % IV SOLN: Freq: Once | INTRAVENOUS | Status: AC

## 2023-12-04 MED ORDER — TAMSULOSIN HCL 0.4 MG PO CAPS
0.4000 mg | ORAL_CAPSULE | Freq: Every day | ORAL | Status: DC
  Administered 2023-12-04 – 2023-12-07 (×4): 0.4 mg via ORAL
  Filled 2023-12-04 (×4): qty 1

## 2023-12-04 MED ORDER — ACETAMINOPHEN 325 MG PO TABS: 325.0000 mg | ORAL_TABLET | Freq: Four times a day (QID) | ORAL | Status: DC | PRN

## 2023-12-04 MED ORDER — ONDANSETRON HCL 4 MG/2ML IJ SOLN
4.0000 mg | Freq: Four times a day (QID) | INTRAMUSCULAR | Status: DC | PRN
Start: 2023-12-04 — End: 2023-12-07

## 2023-12-04 MED ORDER — ATORVASTATIN CALCIUM 20 MG PO TABS
20.0000 mg | ORAL_TABLET | Freq: Every day | ORAL | Status: DC
  Administered 2023-12-04 – 2023-12-07 (×4): 20 mg via ORAL
  Filled 2023-12-04 (×4): qty 1

## 2023-12-04 MED ORDER — HYDRALAZINE HCL 20 MG/ML IJ SOLN
5.0000 mg | INTRAMUSCULAR | Status: DC | PRN
Start: 2023-12-04 — End: 2023-12-07

## 2023-12-04 MED ORDER — CALCITRIOL 0.25 MCG PO CAPS
0.2500 ug | ORAL_CAPSULE | ORAL | Status: DC
  Administered 2023-12-07: 0.25 ug via ORAL
  Filled 2023-12-04: qty 1

## 2023-12-04 MED ORDER — ONDANSETRON HCL 4 MG PO TABS: 4.0000 mg | ORAL_TABLET | Freq: Four times a day (QID) | ORAL | Status: DC | PRN

## 2023-12-04 MED ORDER — APIXABAN 5 MG PO TABS
5.0000 mg | ORAL_TABLET | Freq: Two times a day (BID) | ORAL | Status: DC
  Administered 2023-12-04 – 2023-12-07 (×6): 5 mg via ORAL
  Filled 2023-12-04 (×6): qty 1

## 2023-12-04 MED ORDER — METOPROLOL TARTRATE 25 MG PO TABS
12.5000 mg | ORAL_TABLET | Freq: Three times a day (TID) | ORAL | Status: DC
  Filled 2023-12-04: qty 1

## 2023-12-04 MED ORDER — ETOMIDATE 2 MG/ML IV SOLN
0.1000 mg/kg | Freq: Once | INTRAVENOUS | Status: AC
  Administered 2023-12-04: 7.9 mg via INTRAVENOUS
  Filled 2023-12-04: qty 10

## 2023-12-04 MED ORDER — ALLOPURINOL 100 MG PO TABS
50.0000 mg | ORAL_TABLET | Freq: Every day | ORAL | Status: DC
  Administered 2023-12-04 – 2023-12-07 (×4): 50 mg via ORAL
  Filled 2023-12-04 (×4): qty 1

## 2023-12-04 MED ORDER — ACETAMINOPHEN 650 MG RE SUPP: 650.0000 mg | Freq: Four times a day (QID) | RECTAL | Status: DC | PRN

## 2023-12-04 MED ORDER — DAPAGLIFLOZIN PROPANEDIOL 10 MG PO TABS
10.0000 mg | ORAL_TABLET | Freq: Every day | ORAL | Status: DC
  Administered 2023-12-05 – 2023-12-07 (×3): 10 mg via ORAL
  Filled 2023-12-04 (×4): qty 1

## 2023-12-04 MED ORDER — SODIUM CHLORIDE 0.9 % IV BOLUS
500.0000 mL | Freq: Once | INTRAVENOUS | Status: AC
  Administered 2023-12-04: 500 mL via INTRAVENOUS

## 2023-12-04 MED ORDER — ACETAMINOPHEN 325 MG PO TABS: 650.0000 mg | ORAL_TABLET | Freq: Four times a day (QID) | ORAL | Status: DC | PRN

## 2023-12-04 NOTE — Patient Instructions (Addendum)
 Medication Instructions:  Your physician recommends that you continue on your current medications as directed. Please refer to the Current Medication list given to you today.  Labwork: none  Testing/Procedures: none  Follow-Up: Your physician recommends that you schedule a follow-up appointment in: 1 month (01/01/2024 @1 :00 pm)  Any Other Special Instructions Will Be Listed Below (If Applicable). Please go to [facility] ED for an evaluation now.  If you need a refill on your cardiac medications before your next appointment, please call your pharmacy.

## 2023-12-04 NOTE — Progress Notes (Signed)
 Patient discussed with ER staff, presented from EP clinic for recurrent afib for cardioversion in ER. Successful DCCV in ER, on telemetry he remains in SR with wandering pacemaker and PACs after cardioversion, has not had recurrent afib. Patient with significant AKI, discussed with ER staff recommend admission to medicine for evaluatin  From HF standpoint with AKI can hold his lasix , valsartan , aldactone . As renal function improves may add back therapy. Continue farxiga .   Some low heart rates at times after cardioversion, prior sinus rate was 59 from prior EKG. Would hold his toprol  for now, pending rates could add back potentially at lower dose. If recurrent afib would work toward rate control with oral lopressor  and titrate as neccesary, long term plan is for pacemaker with av ablation as he had failed antiarrhythmics including amio and ablation.    Dorn Ross MD

## 2023-12-04 NOTE — Plan of Care (Signed)
   Problem: Education: Goal: Knowledge of General Education information will improve Description: Including pain rating scale, medication(s)/side effects and non-pharmacologic comfort measures Outcome: Progressing   Problem: Clinical Measurements: Goal: Ability to maintain clinical measurements within normal limits will improve Outcome: Progressing Goal: Diagnostic test results will improve Outcome: Progressing
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
## 2023-12-04 NOTE — Sedation Documentation (Signed)
 120J Synchronized cardioversion performed

## 2023-12-04 NOTE — ED Provider Notes (Signed)
    Procedures  .Sedation  Date/Time: 12/04/2023 2:32 PM  Performed by: Gennaro Duwaine CROME, DO Authorized by: Gennaro Duwaine CROME, DO   Consent:    Consent obtained:  Verbal   Consent given by:  Patient   Risks discussed:  Allergic reaction, inadequate sedation, nausea, vomiting, respiratory compromise necessitating ventilatory assistance and intubation, dysrhythmia, prolonged sedation necessitating reversal and prolonged hypoxia resulting in organ damage Universal protocol:    Immediately prior to procedure, a time out was called: yes     Patient identity confirmed:  Arm band Indications:    Procedure performed:  Cardioversion Pre-sedation assessment:    Time since last food or drink:  Unknown   ASA classification: class 3 - patient with severe systemic disease     Mouth opening:  3 or more finger widths   Thyromental distance:  3 finger widths   Mallampati score:  II - soft palate, uvula, fauces visible   Neck mobility: normal     Pre-sedation assessments completed and reviewed: airway patency, cardiovascular function, hydration status, mental status, nausea/vomiting, pain level, respiratory function and temperature   A pre-sedation assessment was completed prior to the start of the procedure Procedure details (see MAR for exact dosages):    Sedation:  Etomidate    Intended level of sedation: moderate (conscious sedation)   Total Provider sedation time (minutes):  10 Post-procedure details:   A post-sedation assessment was completed following the completion of the procedure.   Attendance: Constant attendance by certified staff until patient recovered     Recovery: Patient returned to pre-procedure baseline     Post-sedation assessments completed and reviewed: airway patency, cardiovascular function, hydration status, mental status, nausea/vomiting, pain level, respiratory function and temperature     Patient is stable for discharge or admission: yes     Procedure completion:   Tolerated well, no immediate complications          Gennaro Duwaine CROME, DO 12/04/23 1457
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
## 2023-12-04 NOTE — ED Provider Notes (Signed)
 [place] EMERGENCY DEPARTMENT AT [facility] Provider Note   CSN: [id number] Arrival date & time: 12/04/23  1400     Patient presents with: Tachycardia   Phillip Wilson is a 78 y.o. male.   Patient is a 78 year old male who presents to the emergency department secondary to palpitations from his cardiologist office.  He was noted to be in A-fib with RVR with hypotension at his cardiologist office.  He was sent to the emergency department for cardioversion.  Patient notes that he has had this performed previously.  He has been compliant with his Eliquis .  He has no acute complaints at this point to include chest pain, shortness of breath, abdominal pain.  He denies any dizziness, lightheadedness or syncope.  He denies any weakness at this time.        Prior to Admission medications   Medication Sig Start Date End Date Taking? Authorizing Provider  acetaminophen  (TYLENOL ) 325 MG tablet Take 325-650 mg by mouth every 6 (six) hours as needed for headache, mild pain or moderate pain.    [provider]  allopurinol  (ZYLOPRIM ) 100 MG tablet TAKE 1/2 (ONE-HALF) TABLET BY MOUTH ONCE DAILY FOR GOUT 10/21/19   Bari Theodoro FALCON, MD  apixaban  (ELIQUIS ) 5 MG TABS tablet Take 1 tablet (5 mg total) by mouth 2 (two) times daily. 03/02/20   Dumont, Theodoro FALCON, MD  atorvastatin  (LIPITOR) 20 MG tablet Take 20 mg by mouth daily. 04/02/22   [provider]  calcitRIOL  (ROCALTROL ) 0.25 MCG capsule .25 mcg 3 times a week. Monday , Wednesday, and Friday 03/02/20   Bari Theodoro FALCON, MD  FARXIGA  10 MG TABS tablet TAKE ONE TABLET BY MOUTH ONCE DAILY. 07/17/23   Rolan Ezra RAMAN, MD  furosemide  (LASIX ) 20 MG tablet TAKE TWO TABLETS BY MOUTH DAILY ALTERNATING 20 MG EVERY OTHER DAY,   PLEASE SCHEDULE APPOINTMENT FOR MORE REFILLS 10/13/23   Glena Harlene HERO, FNP  hydrOXYzine (ATARAX) 25 MG tablet Take 25 mg by mouth at bedtime as needed. 05/15/22   [provider]   isosorbide -hydrALAZINE  (BIDIL ) 20-37.5 MG tablet Take 1 tablet by mouth 3 (three) times daily. PLEASE SCHEDULE APPOINTMENT FOR MORE REFILLS AT [phone] OPTION 3 11/23/23   Rolan Ezra RAMAN, MD  metoprolol  succinate (TOPROL  XL) 50 MG 24 hr tablet Take 1 tablet (50 mg total) by mouth daily. 12/01/23   Rolan Ezra RAMAN, MD  spironolactone  (ALDACTONE ) 25 MG tablet TAKE 1/2 TABLET BY MOUTH EVERY DAY 10/21/23   [place], Harlene HERO, FNP  tamsulosin  (FLOMAX ) 0.4 MG CAPS capsule Take 1 capsule (0.4 mg total) by mouth daily. 03/02/20   Bari Theodoro FALCON, MD  valsartan  (DIOVAN ) 40 MG tablet Take 1 tablet (40 mg total) by mouth daily. 02/24/23   Rolan Ezra RAMAN, MD    Allergies: Entresto  [sacubitril -valsartan ]    Review of Systems  Cardiovascular:  Positive for palpitations.  All other systems reviewed and are negative.   Updated Vital Signs There were no vitals taken for this visit.  Physical Exam Vitals and nursing note reviewed.  Constitutional:      General: He is not in acute distress.    Appearance: Normal appearance. He is not ill-appearing.  HENT:     Head: Normocephalic and atraumatic.     Nose: Nose normal.     Mouth/Throat:     Mouth: Mucous membranes are moist.  Eyes:     Extraocular Movements: Extraocular movements intact.     Conjunctiva/sclera: Conjunctivae normal.  Pupils: Pupils are equal, round, and reactive to light.  Cardiovascular:     Rate and Rhythm: Tachycardia present. Rhythm irregular.     Pulses: Normal pulses.     Heart sounds: Normal heart sounds. No murmur heard.    No gallop.  Pulmonary:     Effort: Pulmonary effort is normal. No respiratory distress.     Breath sounds: Normal breath sounds. No stridor. No wheezing, rhonchi or rales.  Abdominal:     General: Abdomen is flat. Bowel sounds are normal. There is no distension.     Palpations: Abdomen is soft.     Tenderness: There is no abdominal tenderness. There is no guarding.  Musculoskeletal:         General: Normal range of motion.     Cervical back: Normal range of motion and neck supple.     Right lower leg: No edema.     Left lower leg: No edema.  Skin:    General: Skin is warm and dry.     Findings: No rash.  Neurological:     General: No focal deficit present.     Mental Status: He is alert and oriented to person, place, and time. Mental status is at baseline.     Cranial Nerves: No cranial nerve deficit.     Sensory: No sensory deficit.     Motor: No weakness.     Coordination: Coordination normal.     Gait: Gait normal.  Psychiatric:        Mood and Affect: Mood normal.        Behavior: Behavior normal.        Thought Content: Thought content normal.        Judgment: Judgment normal.     (all labs ordered are listed, but only abnormal results are displayed) Labs Reviewed  COMPREHENSIVE METABOLIC PANEL WITH GFR  CBC WITH DIFFERENTIAL/PLATELET  PROTIME-INR  TROPONIN T, HIGH SENSITIVITY    EKG: None  Radiology: No results found.   .Critical Care  Performed by: Daralene Lonni BIRCH, PA-C Authorized by: Daralene Lonni BIRCH, PA-C   Critical care provider statement:    Critical care time (minutes):  35   Critical care was necessary to treat or prevent imminent or life-threatening deterioration of the following conditions: atrial fibrillation with RVR requiring cardioversion.   Critical care was time spent personally by me on the following activities:  Development of treatment plan with patient or surrogate, discussions with consultants, evaluation of patient's response to treatment, examination of patient, ordering and review of laboratory studies, ordering and review of radiographic studies, ordering and performing treatments and interventions, pulse oximetry, re-evaluation of patient's condition and review of old charts   I assumed direction of critical care for this patient from another provider in my specialty: no     Care discussed with: admitting provider       Medications Ordered in the ED - No data to display  Clinical Course as of 12/04/23 1518  Fri Dec 04, 2023  1454 Multiple EKGs not crossing over into MUSE. Post cardio version EKGs show sinus rhythm with multiple PVCs and PACs, no real change when compared with prior.  [MK]    Clinical Course User Index [MK] Gennaro Duwaine CROME, DO                                 Medical Decision Making Amount and/or Complexity of Data Reviewed Labs:  ordered. Radiology: ordered.  Risk Prescription drug management. Decision regarding hospitalization.   This patient presents to the ED for concern of palpitations differential diagnosis includes atrial fibrillation with RVR, ACS, SVT, V. tach, dehydration, acute kidney injury, electrolyte derangement, pulmonary embolus, pneumonia, pneumothorax, hemothorax, CHF    Additional history obtained:  Additional history obtained from medical records External records from outside source obtained and reviewed including medical records   Lab Tests:  I Ordered, and personally interpreted labs.  The pertinent results include: No leukocytosis, no anemia, mild thrombocytopenia, elevated creatinine, alkaline phos and total bilirubin at baseline, mild elevation of troponin   Imaging Studies ordered:  I ordered imaging studies including chest x-ray I independently visualized and interpreted imaging which showed no acute cardiopulmonary process I agree with the radiologist interpretation   Medicines ordered and prescription drug management:  I ordered medication including IV fluids, etomidate  for cardioversion, acute kidney injury Reevaluation of the patient after these medicines showed that the patient improved I have reviewed the patients home medicines and have made adjustments as needed   Problem List / ED Course:  Patient is doing well at this time and does remain stable.  Patient did present to the emergency department from his  electrophysiologist secondary to atrial fibrillation with RVR as well as hypotension.  Patient has no true complaints while in the emergency department.  He does note that earlier in the week he was experiencing nausea and vomiting which has subsided and he noted that he believed it was secondary to something he ate.  Patient denies any active chest pain, shortness of breath at this time and notes that symptoms have greatly improved following cardioversion.  Patient has been compliant with his Eliquis .  He does have findings of acute kidney injury at this point.  Did discuss patient findings with Dr. Alvan with cardiology who does note that his EKG and telemetry do appear to be sinus rhythm with PACs and PVCs at this point with no further atrial fibrillation.  He does agree with admission for acute kidney injury and dehydration at this time.  Have discussed patient case with Dr. Sherlon with the hospitalist service who has excepted for admission.   Social Determinants of Health:  None        Final diagnoses:  None    ED Discharge Orders     None          Daralene Lonni JONETTA DEVONNA 12/04/23 1529    Kammerer, Megan L, DO 12/06/23 [phone]

## 2023-12-04 NOTE — ED Triage Notes (Signed)
 Pt sent here for Afib with RVR. Pt denies any CP

## 2023-12-05 DIAGNOSIS — N1832 Chronic kidney disease, stage 3b: Secondary | ICD-10-CM | POA: Diagnosis not present

## 2023-12-05 DIAGNOSIS — I5022 Chronic systolic (congestive) heart failure: Secondary | ICD-10-CM

## 2023-12-05 DIAGNOSIS — I480 Paroxysmal atrial fibrillation: Secondary | ICD-10-CM

## 2023-12-05 DIAGNOSIS — N179 Acute kidney failure, unspecified: Secondary | ICD-10-CM | POA: Diagnosis not present

## 2023-12-05 DIAGNOSIS — E1121 Type 2 diabetes mellitus with diabetic nephropathy: Secondary | ICD-10-CM

## 2023-12-05 DIAGNOSIS — I4819 Other persistent atrial fibrillation: Secondary | ICD-10-CM

## 2023-12-05 DIAGNOSIS — D6869 Other thrombophilia: Secondary | ICD-10-CM

## 2023-12-05 DIAGNOSIS — D696 Thrombocytopenia, unspecified: Secondary | ICD-10-CM

## 2023-12-05 DIAGNOSIS — E782 Mixed hyperlipidemia: Secondary | ICD-10-CM

## 2023-12-05 LAB — GLUCOSE, CAPILLARY
Glucose-Capillary: 105 mg/dL — ABNORMAL HIGH (ref 70–99)
Glucose-Capillary: 115 mg/dL — ABNORMAL HIGH (ref 70–99)
Glucose-Capillary: 104 mg/dL — ABNORMAL HIGH (ref 70–99)
Glucose-Capillary: 113 mg/dL — ABNORMAL HIGH (ref 70–99)

## 2023-12-05 LAB — HEPATIC FUNCTION PANEL
ALT: 5 U/L (ref 0–44)
AST: 22 U/L (ref 15–41)
Albumin: 3.7 g/dL (ref 3.5–5.0)
Alkaline Phosphatase: 132 U/L — ABNORMAL HIGH (ref 38–126)
Bilirubin, Direct: 0.6 mg/dL — ABNORMAL HIGH (ref 0.0–0.2)
Indirect Bilirubin: 0.7 mg/dL (ref 0.3–0.9)
Total Bilirubin: 1.3 mg/dL — ABNORMAL HIGH (ref 0.0–1.2)
Total Protein: 6.3 g/dL — ABNORMAL LOW (ref 6.5–8.1)

## 2023-12-05 LAB — HEMOGLOBIN A1C
Hgb A1c MFr Bld: 5.9 % — ABNORMAL HIGH (ref 4.8–5.6)
Mean Plasma Glucose: 122.63 mg/dL

## 2023-12-05 LAB — CBC
HCT: 46 % (ref 39.0–52.0)
Hemoglobin: 14.9 g/dL (ref 13.0–17.0)
MCH: 27.7 pg (ref 26.0–34.0)
MCHC: 32.4 g/dL (ref 30.0–36.0)
MCV: 85.7 fL (ref 80.0–100.0)
Platelets: 111 K/uL — ABNORMAL LOW (ref 150–400)
RBC: 5.37 MIL/uL (ref 4.22–5.81)
RDW: 16.1 % — ABNORMAL HIGH (ref 11.5–15.5)
WBC: 5.9 K/uL (ref 4.0–10.5)
nRBC: 0 % (ref 0.0–0.2)

## 2023-12-05 LAB — BASIC METABOLIC PANEL WITH GFR
Anion gap: 12 (ref 5–15)
BUN: 39 mg/dL — ABNORMAL HIGH (ref 8–23)
CO2: 24 mmol/L (ref 22–32)
Calcium: 8.8 mg/dL — ABNORMAL LOW (ref 8.9–10.3)
Chloride: 107 mmol/L (ref 98–111)
Creatinine, Ser: 2.05 mg/dL — ABNORMAL HIGH (ref 0.61–1.24)
GFR, Estimated: 33 mL/min — ABNORMAL LOW (ref >60)
Glucose, Bld: 87 mg/dL (ref 70–99)
Potassium: 4.1 mmol/L (ref 3.5–5.1)
Sodium: 143 mmol/L (ref 135–145)

## 2023-12-05 MED ORDER — SODIUM CHLORIDE 0.9 % IV SOLN: INTRAVENOUS | Status: AC

## 2023-12-05 NOTE — TOC CM/SW Note (Signed)
 Transition of Care [facility]) - Inpatient Brief Assessment   Patient Details  Name: Phillip Wilson MRN: [id number] Date of Birth: 1945-11-15  Transition of Care [facility]) CM/SW Contact:    Noreen KATHEE Pinal, LCSWA Phone Number: 12/05/2023, 9:15 AM   Clinical Narrative:  Inpatient Care Management (ICM) has reviewed patient and no other ICM needs have been identified at this time. We will continue to monitor patient advancement through interdisciplinary progression rounds. If new patient transition needs arise, please place a ICM consult.  Transition of Care Asessment: Insurance and Status: Insurance coverage has been reviewed Patient has primary care physician: No (PCP list added to AVS) Home environment has been reviewed: Single Family Home Prior level of function:: Independent Prior/Current Home Services: No current home services Social Drivers of Health Review: SDOH reviewed interventions complete (Food reosurces added to AVS) Readmission risk has been reviewed: Yes Transition of care needs: no transition of care needs at this time

## 2023-12-05 NOTE — Plan of Care (Signed)

## 2023-12-05 NOTE — Discharge Instructions (Signed)
  Providers Accepting New Patients in [place], [place]    [facility] [address]  [place], [place] [address]J [phone] Accepts most insurances  [facility] [address] [place], [place] [address] [phone] Accepts most insurances  [facility] [address] [place], [place] [address]  [phone] Must meet requirements  [facility] [address] [place], [place] [address] [phone] Accepts most insurances  [facility] [address]  [place], [place] [address] [phone] Accepts most insurances  [facility] [address]   [place], [place]   [phone] Accepts most insurances  [facility])  [phone] [address]  [place], [place] [address] [phone] Accepts most insurances     [facility] [address]  [place], [place] [address] [phone] Accepts most insurances  [facility] [address] [place], [place] [address] [phone] option 1 Accepts Medicaid and [facility] [address]  [place], [place] [address] [phone] Accepts most insurances  Benita Outhouse, MD [address] [place], [place] [address] [phone] Accepts most insurances  [facility] at [facility] [address]  [place], [place] [address] [phone] Accepts most insurances  [facility] [phone] [address] [place], [place] [address] [phone] Accepts most insurances  [place], [place] [address], [address] [place], [place] [address] [phone]  Accepts most insurances   Food  Agency Name: [facility] Address: [address], [place], [place] [address] Phone: [phone] Website: www.adtsrc.org Services Offered: Meals on [employer] and Meals with Friends.    Home care, at home assisted living, volunteer services, Center  for Active Retirement, transportation  Agency Name: [employer] Address: Sites vary. Must call first. Food Pantry location: [address], [place], [place] [address] [employer] Phone: [phone] Website: none Services Offered: Museum/gallery curator, utility assistance if funds available Careers Adviser for all of [place], [employer], [employer], Office Manager and [employer] for [place] area only) Walk-in  current Id and current address verification required.  Wed-Thurs: 9:30-12:00 Agency Name: [facility] Address: [address], [place], [place] [address]  Phone: [phone] or [phone] Website: none Services Offered: Food assistance Agency Name: [employer] Address: [address], [place], [place] [address]  Phone: [phone] or [phone]  Website: none Services Offered: Serves 1 hot meal a day at 11:00 am Monday-Sunday and 5 pm  on the second and fourth Sunday of each month Agency Name: [facility] and [facility]  Services/Social Services  Address: [phone], [place], [place] [address] Phone: [phone] Website: [email or web address]   [email or web address] Services Offered: Sales Executive, [facility] program Agency Name: [facility] Address: [address] [place], [place] [address] Phone: [phone] Website: [email or web address].[email or web address] Services Offered: Food pantry Tuesday, Wednesday and Thursday 9am-11:30am  (need appointment) and health clinic (9:00am-11:00am)  Agency Name: [employer] [facility] Address: [address], [place] / [address] [place] Phone: [phone] [place] / [phone] [place] Website: [email or web address] [email or web address] Services Offered: Civil Service Fast Streamer, food pantry, soup kitchen ([place]) emergency financial  assistance, thrift stores, showers &  hygiene products ([place]),  Christmas Assistance, spiritual help

## 2023-12-05 NOTE — Plan of Care (Signed)
   Problem: Education: Goal: Knowledge of General Education information will improve Description: Including pain rating scale, medication(s)/side effects and non-pharmacologic comfort measures Outcome: Progressing   Problem: Health Behavior/Discharge Planning: Goal: Ability to manage health-related needs will improve Outcome: Progressing   Problem: Clinical Measurements: Goal: Diagnostic test results will improve Outcome: Progressing

## 2023-12-05 NOTE — Hospital Course (Signed)
 78 y.o. male, with history of chronic systolic CHF, EF 69%, paroxysmal A-fib on Eliquis , CKD 3B, type 2 diabetes mellitus, hypertension, chronic thrombocytopenia.  Patient had PCP follow-up today, where he was noted to be in A-fib with RVR, with hypotension, so he was sent to ED for cardioversion, reports he had this done previously, reports he has been compliant with his Eliquis , he denies any chest pain, shortness of breath, or abdominal pain, reports he had nausea and vomiting on Wednesday (think it is likely related to bad food, this has totally resolved).  ED patient received DCCV, where he converted to [name], but noted to have irregular rhythm so he was seen by cardiology where he is currently normal sinus rhythm but multiple PVCs, his workup significant for AKI creatinine of 2.5 from baseline 1.7, so hospitalist requested to admit.

## 2023-12-05 NOTE — Plan of Care (Signed)
   Problem: Education: Goal: Knowledge of General Education information will improve Description: Including pain rating scale, medication(s)/side effects and non-pharmacologic comfort measures Outcome: Progressing   Problem: Activity: Goal: Risk for activity intolerance will decrease Outcome: Progressing   Problem: Nutrition: Goal: Adequate nutrition will be maintained Outcome: Progressing
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
## 2023-12-06 DIAGNOSIS — N179 Acute kidney failure, unspecified: Secondary | ICD-10-CM | POA: Diagnosis not present

## 2023-12-06 LAB — GLUCOSE, CAPILLARY
Glucose-Capillary: 95 mg/dL (ref 70–99)
Glucose-Capillary: 109 mg/dL — ABNORMAL HIGH (ref 70–99)
Glucose-Capillary: 131 mg/dL — ABNORMAL HIGH (ref 70–99)
Glucose-Capillary: 120 mg/dL — ABNORMAL HIGH (ref 70–99)

## 2023-12-06 LAB — BASIC METABOLIC PANEL WITH GFR
Anion gap: 10 (ref 5–15)
BUN: 30 mg/dL — ABNORMAL HIGH (ref 8–23)
CO2: 24 mmol/L (ref 22–32)
Calcium: 8.6 mg/dL — ABNORMAL LOW (ref 8.9–10.3)
Chloride: 106 mmol/L (ref 98–111)
Creatinine, Ser: 1.72 mg/dL — ABNORMAL HIGH (ref 0.61–1.24)
GFR, Estimated: 41 mL/min — ABNORMAL LOW (ref >60)
Glucose, Bld: 83 mg/dL (ref 70–99)
Potassium: 4.1 mmol/L (ref 3.5–5.1)
Sodium: 140 mmol/L (ref 135–145)

## 2023-12-06 LAB — MAGNESIUM: Magnesium: 2.2 mg/dL (ref 1.7–2.4)

## 2023-12-06 MED ORDER — MAGNESIUM SULFATE 2 GM/50ML IV SOLN
2.0000 g | Freq: Once | INTRAVENOUS | Status: AC
  Administered 2023-12-06: 2 g via INTRAVENOUS
  Filled 2023-12-06: qty 50

## 2023-12-06 MED ORDER — SODIUM CHLORIDE 0.9 % IV SOLN: INTRAVENOUS | Status: AC

## 2023-12-06 MED ORDER — METOPROLOL SUCCINATE ER 25 MG PO TB24
12.5000 mg | ORAL_TABLET | Freq: Every day | ORAL | Status: DC
  Administered 2023-12-06: 12.5 mg via ORAL
  Filled 2023-12-06: qty 1

## 2023-12-06 NOTE — Plan of Care (Signed)

## 2023-12-06 NOTE — Plan of Care (Signed)
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
## 2023-12-07 DIAGNOSIS — N179 Acute kidney failure, unspecified: Secondary | ICD-10-CM | POA: Diagnosis not present

## 2023-12-07 DIAGNOSIS — I5042 Chronic combined systolic (congestive) and diastolic (congestive) heart failure: Secondary | ICD-10-CM

## 2023-12-07 DIAGNOSIS — I4819 Other persistent atrial fibrillation: Secondary | ICD-10-CM | POA: Diagnosis not present

## 2023-12-07 LAB — BASIC METABOLIC PANEL WITH GFR
Anion gap: 10 (ref 5–15)
BUN: 24 mg/dL — ABNORMAL HIGH (ref 8–23)
CO2: 25 mmol/L (ref 22–32)
Calcium: 8.9 mg/dL (ref 8.9–10.3)
Chloride: 104 mmol/L (ref 98–111)
Creatinine, Ser: 1.44 mg/dL — ABNORMAL HIGH (ref 0.61–1.24)
GFR, Estimated: 50 mL/min — ABNORMAL LOW (ref >60)
Glucose, Bld: 87 mg/dL (ref 70–99)
Potassium: 4.2 mmol/L (ref 3.5–5.1)
Sodium: 138 mmol/L (ref 135–145)

## 2023-12-07 LAB — PRO BRAIN NATRIURETIC PEPTIDE: Pro Brain Natriuretic Peptide: 1003 pg/mL — ABNORMAL HIGH (ref <300.0)

## 2023-12-07 LAB — GLUCOSE, CAPILLARY: Glucose-Capillary: 113 mg/dL — ABNORMAL HIGH (ref 70–99)

## 2023-12-07 MED ORDER — ISOSORB DINITRATE-HYDRALAZINE 20-37.5 MG PO TABS: 1.0000 | ORAL_TABLET | Freq: Three times a day (TID) | ORAL | Status: DC

## 2023-12-07 MED ORDER — FUROSEMIDE 20 MG PO TABS: 20.0000 mg | ORAL_TABLET | Freq: Every day | ORAL | 11 refills | Status: AC
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
## 2023-12-07 NOTE — Progress Notes (Signed)
 Patient had multiple instances of Heart Pauses over night as well as a 7- Beat run of V-TACH. Covering Hospitalist was made aware . Patient has remained asymptomatic. Resting Comfortably.

## 2023-12-07 NOTE — Progress Notes (Signed)
 PROGRESS NOTE   Phillip Wilson  FMW:[id number] DOB: 10-Oct-1945 DOA: 12/04/2023 PCP: Shona Norleen PEDLAR, MD   Chief Complaint  Patient presents with   Tachycardia   Level of care: Telemetry  Brief Admission History:  78 y.o. male, with history of chronic systolic CHF, EF 69%, paroxysmal A-fib on Eliquis , CKD 3B, type 2 diabetes mellitus, hypertension, chronic thrombocytopenia.  Patient had PCP follow-up today, where he was noted to be in A-fib with RVR, with hypotension, so he was sent to ED for cardioversion, reports he had this done previously, reports he has been compliant with his Eliquis , he denies any chest pain, shortness of breath, or abdominal pain, reports he had nausea and vomiting on Wednesday (think it is likely related to bad food, this has totally resolved).  ED patient received DCCV, where he converted to [name], but noted to have irregular rhythm so he was seen by cardiology where he is currently normal sinus rhythm but multiple PVCs, his workup significant for AKI creatinine of 2.5 from baseline 1.7, so hospitalist requested to admit.      Subjective: The patient was seen and examined this morning, stable no acute distress, denies any chest pain or shortness of breath   Assessment and Plan:  AKI on CKD stage IIId - Baseline creatinine 1.5-1.7, it is 2.5 on admission Lab Results  Component Value Date   CREATININE 1.44 (H) 12/07/2023   CREATININE 1.72 (H) 12/06/2023   CREATININE 2.05 (H) 12/05/2023  - Improvin  -this is most likely prerenal in the setting of hypotension and A-fib with RVR, as well volume depletion due to nausea and vomiting - Continue with IV fluids, monitor volume status closely given low EF at 30% - Avoid nephrotoxic medications. - hold his lasix , valsartan , aldactone  for now, will resume slowly, with caution - creatinine improved to 2.05, continue gentle hydration, reduced rate to 35 mL/hr - recheck BMP in AM    Paroxysmal A-fib with  RVr Secondary hypercoagulable state Hypotension - Patient was in EP clinic where he was found to be symptomatic A-fib with RVR and hypotension - Status post DCCV, currently back to sinus rhythm - CHA2DS2-VASc score of 5, continue with Eliquis  - Monitor telemetry - Heart rate on the lower side, continue to hold his Toprol , anticipating restarting the low-dose - Cardiology note long-term plan is for pacemaker with AV ablation as he failed antiarrhythmics including Amio and ablation in the past  chronic HFrEF  -Most recent EF at 30% -GDMT limited due to chronic kidney disease -Due to AKI will hold Aldactone , valsartan  and Lasix  -Continue with Farxiga  -If blood pressure remains stable could resume on BiDil  tomorrow -Will resume Toprol  versus metoprolol  once heart rate starts to increase -Euvolemic, will hold Lasix  -Follow-up in advanced heart failure clinic   Nausea/Vomiting -resolved - Total bili and alk phos is elevated, known history of cholelithiasis in the past, he denies any abdominal pain, asymptomatic, will monitor LFTs closely for now    Chronic kidney disease, stage 3b -Mild bump in creatinine this weekend, appears to be stabilizing, avoid hypotension, creatinine 2.4 at discharge, follow-up with Dr. Rachele   Type 2 diabetes mellitus  - Check A1c, CBG, will continue with Farxiga  CBG (last 3)  Recent Labs    12/06/23 1120 12/06/23 1625 12/06/23 2119  GLUCAP 109* 131* 120*     Elevated serum free T4 level Mildly elevated free T4 1.18, TSH was 13.376 on 04/21/2022.  On chronic amiodarone .  -Repeat TSH was elevated at  11.8, started Synthroid  75 mcg this admission, will need follow-up labs and further titration   Thrombocytopenia History of TTP -Followed by Dr. Rogers, platelet count stable   DVT prophylaxis: apixaban  Code Status: Full  Family Communication:  Disposition:    Consultants:   Procedures:   Antimicrobials:    Objective: Vitals:   12/06/23  1314 12/06/23 2019 12/06/23 2116 12/07/23 0454  BP: 128/82 119/73 123/78 136/69  Pulse: 60 (!) 51 (!) 55 (!) 44  Resp: 19 18  18   Temp: 98.3 F (36.8 C) 98.8 F (37.1 C) 97.9 F (36.6 C) 98.7 F (37.1 C)  TempSrc:  Oral Oral Oral  SpO2: 98% 98% 96% 95%  Weight:      Height:        Intake/Output Summary (Last 24 hours) at 12/07/2023 1117 Last data filed at 12/07/2023 9362 Gross per 24 hour  Intake 960 ml  Output --  Net 960 ml   Filed Weights   12/04/23 1654  Weight: 79 kg   Examination:      General:  AAO x 3,  cooperative, no distress;   HEENT:  Normocephalic, PERRL, otherwise with in Normal limits   Neuro:  CNII-XII intact. , normal motor and sensation, reflexes intact   Lungs:   Clear to auscultation BL, Respirations unlabored,  No wheezes / crackles  Cardio:    S1/S2, RRR, No murmure, No Rubs or Gallops   Abdomen:  Soft, non-tender, bowel sounds active all four quadrants, no guarding or peritoneal signs.  Muscular  skeletal:  Limited exam -global generalized weaknesses - in bed, able to move all 4 extremities,   2+ pulses,  symmetric, No pitting edema  Skin:  Dry, warm to touch, negative for any Rashes,  Wounds: Please see nursing documentation      Data Reviewed: I have personally reviewed following labs and imaging studies  CBC: Recent Labs  Lab 12/01/23 1136 12/04/23 1404 12/05/23 0356  WBC 6.0 9.5 5.9  NEUTROABS  --  7.6  --   HGB 16.8 16.9 14.9  HCT 51.7 51.9 46.0  MCV 84.3 85.5 85.7  PLT 137* 147* 111*    Basic Metabolic Panel: Recent Labs  Lab 12/01/23 1136 12/04/23 1404 12/05/23 0356 12/06/23 0445 12/07/23 0429  NA 139 143 143 140 138  K 4.4 4.4 4.1 4.1 4.2  CL 99 102 107 106 104  CO2 29 29 24 24 25   GLUCOSE 113* 112* 87 83 87  BUN 18 40* 39* 30* 24*  CREATININE 1.74* 2.54* 2.05* 1.72* 1.44*  CALCIUM  9.4 9.8 8.8* 8.6* 8.9  MG  --  2.3  --  2.2  --     CBG: Recent Labs  Lab 12/05/23 2003 12/06/23 0741 12/06/23 1120  12/06/23 1625 12/06/23 2119  GLUCAP 113* 95 109* 131* 120*    No results found for this or any previous visit (from the past 240 hours).   Radiology Studies: No results found.   Scheduled Meds:  allopurinol   50 mg Oral Daily   apixaban   5 mg Oral BID   atorvastatin   20 mg Oral Daily   calcitRIOL   0.25 mcg Oral Q M,W,F   dapagliflozin  propanediol  10 mg Oral Daily   tamsulosin   0.4 mg Oral Daily   Continuous Infusions:  sodium chloride         LOS: 3 days   Time spent: 55 mins  Adriana DELENA Grams, MD How to contact the [facility] Attending or Consulting provider 7A - 7P or  covering provider during after hours 7P -7A, for this patient?  Check the care team in [facility] and look for a) attending/consulting [facility] provider listed and b) the TR[facility] team listed Log into [email or web address] to find provider on call.  Locate the [facility] provider you are looking for under [facility] and page to a number that you can be directly reached. If you still have difficulty reaching the provider, please page the [facility] (Director on Call) for the Hospitalists listed on amion for assistance.  12/07/2023, 11:17 AM

## 2023-12-08 ENCOUNTER — Telehealth: Payer: Self-pay | Admitting: *Deleted

## 2023-12-08 NOTE — Transitions of Care (Post Inpatient/ED Visit) (Signed)
   12/08/2023  Name: Phillip Wilson MRN: [id number] DOB: 02/12/1945  Today's TOC FU Call Status: Today's TOC FU Call Status:: Unsuccessful Call (1st Attempt) Unsuccessful Call (1st Attempt) Date: 12/08/23  Attempted to reach the patient regarding the most recent Inpatient/ED visit.  Follow Up Plan: Additional outreach attempts will be made to reach the patient to complete the Transitions of Care (Post Inpatient/ED visit) call.   Andrea Dimes RN, BSN [place]  [facility] RN Care Manager [phone]

## 2023-12-09 ENCOUNTER — Telehealth: Payer: Self-pay | Admitting: *Deleted

## 2023-12-09 NOTE — Transitions of Care (Post Inpatient/ED Visit) (Signed)
   12/09/2023  Name: Phillip Wilson MRN: [id number] DOB: 25-Feb-1945  Today's TOC FU Call Status: Today's TOC FU Call Status:: Unsuccessful Call (2nd Attempt) Unsuccessful Call (2nd Attempt) Date: 12/09/23  Attempted to reach the patient regarding the most recent Inpatient/ED visit.  Follow Up Plan: Additional outreach attempts will be made to reach the patient to complete the Transitions of Care (Post Inpatient/ED visit) call.   Andrea Dimes RN, BSN [place]  [facility] RN Care Manager [phone]

## 2023-12-10 ENCOUNTER — Telehealth: Payer: Self-pay

## 2023-12-10 NOTE — Transitions of Care (Post Inpatient/ED Visit) (Signed)
   12/10/2023  Name: ANANTH FIALLOS MRN: [id number] DOB: [birth year]  Today's TOC FU Call Status: Today's TOC FU Call Status:: Unsuccessful Call (3rd Attempt) Unsuccessful Call (3rd Attempt) Date: 12/10/23  Attempted to reach the patient regarding the most recent Inpatient/ED visit.  Follow Up Plan: No further outreach attempts will be made at this time. We have been unable to contact the patient.  Arvin Seip RN, BSN, CCM [employer], Population Health Case Manager Phone: [phone]

## 2023-12-28 NOTE — Progress Notes (Signed)
 Phillip Wilson                                          MRN: [id number]   12/28/2023   The VBCI Quality Team Specialist reviewed this patient medical record for the purposes of chart review for care gap closure. The following were reviewed: chart review for care gap closure-kidney health evaluation for diabetes:eGFR  and uACR.    VBCI Quality Team

## 2024-01-01 ENCOUNTER — Ambulatory Visit: Attending: Cardiovascular Disease | Admitting: Cardiovascular Disease

## 2024-02-06 ENCOUNTER — Other Ambulatory Visit (HOSPITAL_COMMUNITY): Payer: Self-pay | Admitting: Cardiology

## 2024-02-16 ENCOUNTER — Other Ambulatory Visit (HOSPITAL_COMMUNITY): Payer: Self-pay | Admitting: Cardiology

## 2024-03-01 ENCOUNTER — Telehealth (HOSPITAL_COMMUNITY): Payer: Self-pay

## 2024-03-01 NOTE — Telephone Encounter (Signed)
 Called to confirm/remind patient of their appointment at the [facility] on 03/02/24.   Appointment:   [] Confirmed  [] Left mess   [] No answer/No voice mail  [x] VM Full/unable to leave message  [] Phone not in service

## 2024-03-02 ENCOUNTER — Ambulatory Visit (HOSPITAL_COMMUNITY)
